# Patient Record
Sex: Male | Born: 1935
Health system: Southern US, Community
[De-identification: ages and names within clinical notes are randomized; demographics above are authoritative.]

## PROBLEM LIST (undated history)

## (undated) DIAGNOSIS — I2510 Atherosclerotic heart disease of native coronary artery without angina pectoris: Secondary | ICD-10-CM

## (undated) DIAGNOSIS — M1990 Unspecified osteoarthritis, unspecified site: Secondary | ICD-10-CM

## (undated) DIAGNOSIS — I10 Essential (primary) hypertension: Secondary | ICD-10-CM

## (undated) DIAGNOSIS — R066 Hiccough: Secondary | ICD-10-CM

## (undated) DIAGNOSIS — I739 Peripheral vascular disease, unspecified: Secondary | ICD-10-CM

## (undated) DIAGNOSIS — Z87442 Personal history of urinary calculi: Secondary | ICD-10-CM

## (undated) DIAGNOSIS — K219 Gastro-esophageal reflux disease without esophagitis: Secondary | ICD-10-CM

## (undated) DIAGNOSIS — K2970 Gastritis, unspecified, without bleeding: Secondary | ICD-10-CM

## (undated) DIAGNOSIS — E785 Hyperlipidemia, unspecified: Secondary | ICD-10-CM

## (undated) DIAGNOSIS — B9681 Helicobacter pylori [H. pylori] as the cause of diseases classified elsewhere: Secondary | ICD-10-CM

## (undated) HISTORY — DX: Helicobacter pylori (H. pylori) as the cause of diseases classified elsewhere: K29.70

## (undated) HISTORY — DX: Hyperlipidemia, unspecified: E78.5

## (undated) HISTORY — DX: Peripheral vascular disease, unspecified: I73.9

## (undated) HISTORY — PX: JOINT REPLACEMENT: SHX530

## (undated) HISTORY — PX: CATARACT EXTRACTION: SUR2

## (undated) HISTORY — DX: Essential (primary) hypertension: I10

## (undated) HISTORY — DX: Atherosclerotic heart disease of native coronary artery without angina pectoris: I25.10

## (undated) HISTORY — DX: Helicobacter pylori (H. pylori) as the cause of diseases classified elsewhere: B96.81

## (undated) HISTORY — PX: EYE SURGERY: SHX253

## (undated) HISTORY — DX: Hiccough: R06.6

## (undated) HISTORY — PX: COLONOSCOPY: SHX174

---

## 1998-12-07 ENCOUNTER — Other Ambulatory Visit: Admission: RE | Admit: 1998-12-07 | Discharge: 1998-12-07 | Payer: Self-pay | Admitting: *Deleted

## 2006-02-20 HISTORY — PX: ESOPHAGOGASTRODUODENOSCOPY: SHX1529

## 2007-01-10 ENCOUNTER — Ambulatory Visit: Payer: Self-pay | Admitting: Vascular Surgery

## 2008-03-07 ENCOUNTER — Encounter: Payer: Self-pay | Admitting: Cardiology

## 2008-04-14 ENCOUNTER — Ambulatory Visit (HOSPITAL_COMMUNITY): Admission: RE | Admit: 2008-04-14 | Discharge: 2008-04-14 | Payer: Self-pay | Admitting: Cardiology

## 2008-05-06 ENCOUNTER — Encounter: Payer: Self-pay | Admitting: Thoracic Surgery (Cardiothoracic Vascular Surgery)

## 2008-05-07 ENCOUNTER — Inpatient Hospital Stay (HOSPITAL_COMMUNITY): Admission: AD | Admit: 2008-05-07 | Discharge: 2008-05-10 | Payer: Self-pay | Admitting: Cardiology

## 2008-05-07 ENCOUNTER — Ambulatory Visit: Payer: Self-pay | Admitting: Thoracic Surgery (Cardiothoracic Vascular Surgery)

## 2008-05-07 HISTORY — PX: CORONARY ARTERY BYPASS GRAFT: SHX141

## 2008-06-02 ENCOUNTER — Ambulatory Visit: Payer: Self-pay | Admitting: Thoracic Surgery (Cardiothoracic Vascular Surgery)

## 2008-06-02 ENCOUNTER — Encounter
Admission: RE | Admit: 2008-06-02 | Discharge: 2008-06-02 | Payer: Self-pay | Admitting: Thoracic Surgery (Cardiothoracic Vascular Surgery)

## 2008-06-05 ENCOUNTER — Encounter (HOSPITAL_COMMUNITY): Admission: RE | Admit: 2008-06-05 | Discharge: 2008-09-06 | Payer: Self-pay | Admitting: Cardiology

## 2009-02-28 ENCOUNTER — Emergency Department (HOSPITAL_COMMUNITY): Admission: EM | Admit: 2009-02-28 | Discharge: 2009-02-28 | Payer: Self-pay | Admitting: Emergency Medicine

## 2009-03-05 ENCOUNTER — Telehealth: Payer: Self-pay | Admitting: Internal Medicine

## 2009-03-06 ENCOUNTER — Ambulatory Visit: Payer: Self-pay | Admitting: Internal Medicine

## 2009-03-09 ENCOUNTER — Encounter: Payer: Self-pay | Admitting: Internal Medicine

## 2009-03-09 ENCOUNTER — Ambulatory Visit: Payer: Self-pay | Admitting: Internal Medicine

## 2009-03-09 HISTORY — PX: UPPER GASTROINTESTINAL ENDOSCOPY: SHX188

## 2009-07-13 ENCOUNTER — Inpatient Hospital Stay (HOSPITAL_COMMUNITY): Admission: RE | Admit: 2009-07-13 | Discharge: 2009-07-16 | Payer: Self-pay | Admitting: Orthopedic Surgery

## 2010-12-17 ENCOUNTER — Ambulatory Visit (INDEPENDENT_AMBULATORY_CARE_PROVIDER_SITE_OTHER): Payer: Medicare Other | Admitting: Cardiology

## 2010-12-17 DIAGNOSIS — I10 Essential (primary) hypertension: Secondary | ICD-10-CM

## 2010-12-17 DIAGNOSIS — E119 Type 2 diabetes mellitus without complications: Secondary | ICD-10-CM

## 2010-12-17 DIAGNOSIS — I2510 Atherosclerotic heart disease of native coronary artery without angina pectoris: Secondary | ICD-10-CM

## 2010-12-21 ENCOUNTER — Ambulatory Visit (HOSPITAL_COMMUNITY)
Admission: RE | Admit: 2010-12-21 | Discharge: 2010-12-21 | Disposition: A | Discharge status: Home / Self Care | Payer: Medicare Other | Source: Ambulatory Visit | Attending: General Surgery | Admitting: General Surgery

## 2010-12-21 ENCOUNTER — Encounter (HOSPITAL_COMMUNITY)
Admission: RE | Admit: 2010-12-21 | Discharge: 2010-12-21 | Disposition: A | Discharge status: Home / Self Care | Payer: Medicare Other | Source: Ambulatory Visit | Attending: General Surgery | Admitting: General Surgery

## 2010-12-21 ENCOUNTER — Other Ambulatory Visit (HOSPITAL_COMMUNITY): Payer: Self-pay | Admitting: General Surgery

## 2010-12-21 DIAGNOSIS — Z01812 Encounter for preprocedural laboratory examination: Secondary | ICD-10-CM | POA: Insufficient documentation

## 2010-12-21 DIAGNOSIS — Z01811 Encounter for preprocedural respiratory examination: Secondary | ICD-10-CM

## 2010-12-21 DIAGNOSIS — Z01818 Encounter for other preprocedural examination: Secondary | ICD-10-CM | POA: Insufficient documentation

## 2010-12-21 LAB — COMPREHENSIVE METABOLIC PANEL
ALT: 15 U/L (ref 0–53)
AST: 20 U/L (ref 0–37)
Albumin: 4 g/dL (ref 3.5–5.2)
Alkaline Phosphatase: 79 U/L (ref 39–117)
Calcium: 9.9 mg/dL (ref 8.4–10.5)
GFR calc Af Amer: >60 mL/min (ref >60)
Glucose, Bld: 142 mg/dL — ABNORMAL HIGH (ref 70–99)
Potassium: 5.4 mEq/L — ABNORMAL HIGH (ref 3.5–5.1)
Sodium: 134 mEq/L — ABNORMAL LOW (ref 135–145)
Total Protein: 7.2 g/dL (ref 6.0–8.3)

## 2010-12-21 LAB — DIFFERENTIAL
Basophils Absolute: 0 K/uL (ref 0.0–0.1)
Basophils Relative: 0 % (ref 0–1)
Eosinophils Absolute: 0.2 K/uL (ref 0.0–0.7)
Eosinophils Relative: 2 % (ref 0–5)
Lymphocytes Relative: 28 % (ref 12–46)
Lymphs Abs: 2.9 K/uL (ref 0.7–4.0)
Monocytes Absolute: 0.7 K/uL (ref 0.1–1.0)
Monocytes Relative: 7 % (ref 3–12)
Neutro Abs: 6.4 K/uL (ref 1.7–7.7)
Neutrophils Relative %: 62 % (ref 43–77)

## 2010-12-21 LAB — CBC
HCT: 42.8 % (ref 39.0–52.0)
MCHC: 34.8 g/dL (ref 30.0–36.0)
Platelets: 169 10*3/uL (ref 150–400)
RDW: 13.1 % (ref 11.5–15.5)
WBC: 10.2 10*3/uL (ref 4.0–10.5)

## 2010-12-23 LAB — MRSA CULTURE

## 2010-12-24 ENCOUNTER — Ambulatory Visit (HOSPITAL_COMMUNITY)
Admission: RE | Admit: 2010-12-24 | Discharge: 2010-12-24 | Disposition: A | Discharge status: Home / Self Care | Payer: Medicare Other | Source: Ambulatory Visit | Attending: General Surgery | Admitting: General Surgery

## 2010-12-24 DIAGNOSIS — Z7982 Long term (current) use of aspirin: Secondary | ICD-10-CM | POA: Insufficient documentation

## 2010-12-24 DIAGNOSIS — M129 Arthropathy, unspecified: Secondary | ICD-10-CM | POA: Insufficient documentation

## 2010-12-24 DIAGNOSIS — I2510 Atherosclerotic heart disease of native coronary artery without angina pectoris: Secondary | ICD-10-CM | POA: Insufficient documentation

## 2010-12-24 DIAGNOSIS — Z951 Presence of aortocoronary bypass graft: Secondary | ICD-10-CM | POA: Insufficient documentation

## 2010-12-24 DIAGNOSIS — E7800 Pure hypercholesterolemia, unspecified: Secondary | ICD-10-CM | POA: Insufficient documentation

## 2010-12-24 DIAGNOSIS — K4020 Bilateral inguinal hernia, without obstruction or gangrene, not specified as recurrent: Secondary | ICD-10-CM | POA: Insufficient documentation

## 2010-12-24 DIAGNOSIS — F17200 Nicotine dependence, unspecified, uncomplicated: Secondary | ICD-10-CM | POA: Insufficient documentation

## 2010-12-24 DIAGNOSIS — E119 Type 2 diabetes mellitus without complications: Secondary | ICD-10-CM | POA: Insufficient documentation

## 2010-12-24 DIAGNOSIS — I10 Essential (primary) hypertension: Secondary | ICD-10-CM | POA: Insufficient documentation

## 2010-12-24 HISTORY — PX: HERNIA REPAIR: SHX51

## 2010-12-24 LAB — GLUCOSE, CAPILLARY
Glucose-Capillary: 160 mg/dL — ABNORMAL HIGH (ref 70–99)
Glucose-Capillary: 138 mg/dL — ABNORMAL HIGH (ref 70–99)

## 2010-12-29 NOTE — Op Note (Signed)
NAME:  [name], [name]                  ACCOUNT NO.:  [id number]  MEDICAL RECORD NO.:  [id number]           PATIENT TYPE:  O  LOCATION:  SDSC                         FACILITY:  [facility]  PHYSICIAN:  Ollen Gross. Vernell Morgans, M.D. DATE OF BIRTH:  June 07, 1935  DATE OF PROCEDURE:  12/24/2010 DATE OF DISCHARGE:  12/24/2010                              OPERATIVE REPORT   PREOPERATIVE DIAGNOSIS:  Right inguinal hernia.  POSTOPERATIVE DIAGNOSIS:  Right direct inguinal hernia.  PROCEDURES:  Right inguinal hernia repair with mesh.  SURGEON:  Ollen Gross. Vernell Morgans, MD.  ANESTHESIA:  General via LMA.  DESCRIPTION OF PROCEDURE:  After informed consent was obtained, the patient was brought to the operating room, placed in supine position on the operating room table.  After adequate induction of general anesthesia, the patient's abdomen and the right groin were prepped with ChloraPrep, allowed to dry, and draped in usual sterile manner.  The right groin was then infiltrated with 0.25% Marcaine.  An incision was made from the edge of the pubic tubercle on the right towards the anterior cephalic spine.  With a 15 blade knife, this incision was carried down through the skin and subcutaneous tissue sharply with the electrocautery until the fascia of the external oblique was encountered. Small bridging vein was clamped with hemostats, divided and ligated with 3-0 silk ties.  The external oblique fascia was opened along its fibers towards the apex of the external ring with 15 blade knife and Metzenbaum scissors.  A Weitlaner retractor was then deployed.  Blunt dissection was then carried out in this of the cord structures at the edge of the pubic tubercle until they could be surrounded between two fingers.  A 1/2-inch Penrose drain was placed around the cord structures for retraction purposes.  The cord was then gently skeletonized.  There was a lipoma of the cord that was excised sharply by combination of  blunt hemostat dissection and some sharp dissection with the electrocautery. The base of the lipoma was clamped with hemostat, divided, and ligated with 3-0 silk tie.  There was no hernia sac that I could appreciate with the cord.  There was a definite bulging defect medial to this in the floor of the canal.  It was a broad based.  It was readily reduced and the floor of the canal was then repaired with a running 0 Vicryl stitch. The tails of the stitch were left long at the edge of the cord.  Next, a 3 x 6 piece of Ultrapro mesh was chosen and cut to fit.  The mesh was sewed inferiorly to the shelving edge of the inguinal ligament with a running 2-0 Prolene stitch.  The tails of the Vicryl were brought through the mesh and tied down.  Tails were cut in the lateral part of the mesh and the tails were wrapped around the cord structures. Superiorly, the mesh was sewed to the muscular aponeurotic strength layer of the transversalis with interrupted 2-0 Prolene vertical mattress stitches lateral to the cord.  Tails of the mesh were anchored to the shelving edge of the inguinal ligament with  interrupted 2-0 Prolene stitch.  The ileal inguinal nerve was never seen on the surface of the cord.  At this point, the hernia appeared to be well repaired and the mesh was in good position without any tension.  The wound was irrigated with copious amounts of saline.  The vas and testicular artery were preserved.  The external oblique fascia was then reapproximated with a running 2-0 Vicryl stitch.  The wound was infiltrated with more 0.25% Marcaine.  Subcutaneous fascia was closed with a running 3-0 Vicryl stitch and the skin was closed with running 4-0 Monocryl subcuticular stitch.  Dermabond dressing was applied.  The patient tolerated the procedure well.  At the end of the case, all needle, sponge, and instrument counts were correct.  The patient was then awakened and taken to recovery room in stable  condition.  The patient's testicle was in the scrotum at the end of the case.     Ollen Gross. Vernell Morgans, M.D.     PST/MEDQ  D:  12/24/2010  T:  12/25/2010  Job:  [id number]  Electronically Signed by Chevis Pretty III M.D. on 12/29/2010 08:15:59 AM

## 2011-02-04 LAB — BASIC METABOLIC PANEL
BUN: 8 mg/dL (ref 6–23)
BUN: 6 mg/dL (ref 6–23)
BUN: 8 mg/dL (ref 6–23)
CO2: 28 mEq/L (ref 19–32)
CO2: 27 mEq/L (ref 19–32)
CO2: 28 mEq/L (ref 19–32)
Calcium: 8.2 mg/dL — ABNORMAL LOW (ref 8.4–10.5)
Calcium: 8 mg/dL — ABNORMAL LOW (ref 8.4–10.5)
Calcium: 8.1 mg/dL — ABNORMAL LOW (ref 8.4–10.5)
Chloride: 93 mEq/L — ABNORMAL LOW (ref 96–112)
Chloride: 95 mEq/L — ABNORMAL LOW (ref 96–112)
Chloride: 98 mEq/L (ref 96–112)
Creatinine, Ser: 0.82 mg/dL (ref 0.4–1.5)
Creatinine, Ser: 0.84 mg/dL (ref 0.4–1.5)
Creatinine, Ser: 0.86 mg/dL (ref 0.4–1.5)
GFR calc Af Amer: >60 mL/min (ref >60)
GFR calc Af Amer: >60 mL/min (ref >60)
GFR calc Af Amer: >60 mL/min (ref >60)
GFR calc non Af Amer: >60 mL/min (ref >60)
GFR calc non Af Amer: >60 mL/min (ref >60)
GFR calc non Af Amer: >60 mL/min (ref >60)
Glucose, Bld: 170 mg/dL — ABNORMAL HIGH (ref 70–99)
Glucose, Bld: 128 mg/dL — ABNORMAL HIGH (ref 70–99)
Glucose, Bld: 129 mg/dL — ABNORMAL HIGH (ref 70–99)
Potassium: 4.5 mEq/L (ref 3.5–5.1)
Potassium: 4.2 mEq/L (ref 3.5–5.1)
Potassium: 4.5 mEq/L (ref 3.5–5.1)
Sodium: 128 mEq/L — ABNORMAL LOW (ref 135–145)
Sodium: 129 mEq/L — ABNORMAL LOW (ref 135–145)
Sodium: 131 mEq/L — ABNORMAL LOW (ref 135–145)

## 2011-02-04 LAB — CBC
HCT: 28.7 % — ABNORMAL LOW (ref 39.0–52.0)
HCT: 30.5 % — ABNORMAL LOW (ref 39.0–52.0)
HCT: 32.5 % — ABNORMAL LOW (ref 39.0–52.0)
HCT: 45.3 % (ref 39.0–52.0)
Hemoglobin: 10.3 g/dL — ABNORMAL LOW (ref 13.0–17.0)
Hemoglobin: 10.4 g/dL — ABNORMAL LOW (ref 13.0–17.0)
Hemoglobin: 11.2 g/dL — ABNORMAL LOW (ref 13.0–17.0)
Hemoglobin: 15.4 g/dL (ref 13.0–17.0)
MCHC: 35.9 g/dL (ref 30.0–36.0)
MCHC: 34 g/dL (ref 30.0–36.0)
MCHC: 34 g/dL (ref 30.0–36.0)
MCHC: 34.3 g/dL (ref 30.0–36.0)
MCV: 89.4 fL (ref 78.0–100.0)
MCV: 90.5 fL (ref 78.0–100.0)
MCV: 90.7 fL (ref 78.0–100.0)
MCV: 91 fL (ref 78.0–100.0)
Platelets: 141 10*3/uL — ABNORMAL LOW (ref 150–400)
Platelets: 125 10*3/uL — ABNORMAL LOW (ref 150–400)
Platelets: 127 10*3/uL — ABNORMAL LOW (ref 150–400)
Platelets: 161 10*3/uL (ref 150–400)
RBC: 3.21 MIL/uL — ABNORMAL LOW (ref 4.22–5.81)
RBC: 3.35 MIL/uL — ABNORMAL LOW (ref 4.22–5.81)
RBC: 3.6 MIL/uL — ABNORMAL LOW (ref 4.22–5.81)
RBC: 5 MIL/uL (ref 4.22–5.81)
RDW: 13.4 % (ref 11.5–15.5)
RDW: 13.8 % (ref 11.5–15.5)
RDW: 13.8 % (ref 11.5–15.5)
RDW: 14.2 % (ref 11.5–15.5)
WBC: 11.8 10*3/uL — ABNORMAL HIGH (ref 4.0–10.5)
WBC: 10.6 10*3/uL — ABNORMAL HIGH (ref 4.0–10.5)
WBC: 10.6 10*3/uL — ABNORMAL HIGH (ref 4.0–10.5)
WBC: 11.3 10*3/uL — ABNORMAL HIGH (ref 4.0–10.5)

## 2011-02-04 LAB — GLUCOSE, CAPILLARY
Glucose-Capillary: 158 mg/dL — ABNORMAL HIGH (ref 70–99)
Glucose-Capillary: 103 mg/dL — ABNORMAL HIGH (ref 70–99)
Glucose-Capillary: 106 mg/dL — ABNORMAL HIGH (ref 70–99)
Glucose-Capillary: 116 mg/dL — ABNORMAL HIGH (ref 70–99)
Glucose-Capillary: 117 mg/dL — ABNORMAL HIGH (ref 70–99)
Glucose-Capillary: 125 mg/dL — ABNORMAL HIGH (ref 70–99)
Glucose-Capillary: 133 mg/dL — ABNORMAL HIGH (ref 70–99)
Glucose-Capillary: 135 mg/dL — ABNORMAL HIGH (ref 70–99)
Glucose-Capillary: 159 mg/dL — ABNORMAL HIGH (ref 70–99)
Glucose-Capillary: 175 mg/dL — ABNORMAL HIGH (ref 70–99)
Glucose-Capillary: 179 mg/dL — ABNORMAL HIGH (ref 70–99)
Glucose-Capillary: 92 mg/dL (ref 70–99)
Glucose-Capillary: 95 mg/dL (ref 70–99)

## 2011-02-04 LAB — PROTIME-INR
INR: 2.1 — ABNORMAL HIGH (ref 0.00–1.49)
INR: 1 (ref 0.00–1.49)
INR: 1.3 (ref 0.00–1.49)
INR: 1.9 — ABNORMAL HIGH (ref 0.00–1.49)
Prothrombin Time: 23.3 seconds — ABNORMAL HIGH (ref 11.6–15.2)
Prothrombin Time: 13 seconds (ref 11.6–15.2)
Prothrombin Time: 15.9 seconds — ABNORMAL HIGH (ref 11.6–15.2)
Prothrombin Time: 21.4 seconds — ABNORMAL HIGH (ref 11.6–15.2)

## 2011-02-04 LAB — URINALYSIS, ROUTINE W REFLEX MICROSCOPIC
Bilirubin Urine: NEGATIVE
Glucose, UA: NEGATIVE mg/dL
Hgb urine dipstick: NEGATIVE
Ketones, ur: NEGATIVE mg/dL
Nitrite: NEGATIVE
Protein, ur: NEGATIVE mg/dL
Specific Gravity, Urine: 1.016 (ref 1.005–1.030)
Urobilinogen, UA: 1 mg/dL (ref 0.0–1.0)
pH: 6 (ref 5.0–8.0)

## 2011-02-04 LAB — COMPREHENSIVE METABOLIC PANEL
ALT: 15 U/L (ref 0–53)
AST: 25 U/L (ref 0–37)
Albumin: 4 g/dL (ref 3.5–5.2)
Alkaline Phosphatase: 73 U/L (ref 39–117)
BUN: 14 mg/dL (ref 6–23)
CO2: 29 mEq/L (ref 19–32)
Calcium: 9.5 mg/dL (ref 8.4–10.5)
Chloride: 96 mEq/L (ref 96–112)
Creatinine, Ser: 0.8 mg/dL (ref 0.4–1.5)
GFR calc Af Amer: >60 mL/min (ref >60)
GFR calc non Af Amer: >60 mL/min (ref >60)
Glucose, Bld: 121 mg/dL — ABNORMAL HIGH (ref 70–99)
Potassium: 4.7 mEq/L (ref 3.5–5.1)
Sodium: 132 mEq/L — ABNORMAL LOW (ref 135–145)
Total Bilirubin: 0.7 mg/dL (ref 0.3–1.2)
Total Protein: 7.1 g/dL (ref 6.0–8.3)

## 2011-02-04 LAB — TYPE AND SCREEN
ABO/RH(D): B POS
Antibody Screen: NEGATIVE

## 2011-02-04 LAB — APTT: aPTT: 28 seconds (ref 24–37)

## 2011-02-04 LAB — ABO/RH: ABO/RH(D): B POS

## 2011-02-08 LAB — GLUCOSE, CAPILLARY: Glucose-Capillary: 129 mg/dL — ABNORMAL HIGH (ref 70–99)

## 2011-03-15 NOTE — Consult Note (Signed)
NAME:  [name], [name]                  ACCOUNT NO.:  [id number]   MEDICAL RECORD NO.:  [id number]          PATIENT TYPE:  OIB   LOCATION:  3741                         FACILITY:  [facility]   PHYSICIAN:  Salvatore Decent. Dorris Fetch, M.D.DATE OF BIRTH:  1935-10-28   DATE OF CONSULTATION:  DATE OF DISCHARGE:                                 CONSULTATION   REASON FOR CONSULTATION:  Severe left main disease.   HISTORY OF PRESENT ILLNESS:  Mr. Matos is a 76 year old gentleman who has  a history of type 2 diabetes and dyslipidemia and tobacco abuse.  He was  seen in 2003, with exertional chest pain.  He would also occasionally  have episodes of arrest at time and he was also having increased  fatigue.  He had a stress Cardiolite at that time, which was consistent  with ischemia.  His ejection fraction was normal.  He was advised to  have cardiac catheterization, but refused at that time and really done  well since then.  He remain physically active walking, usually 3 miles a  day, but he had noticed over the past several weeks that after walking  about 15 minutes, he would get tired and then with minimal rest  sometimes less than a minute, he would recover and then be able to  complete his walk.  He mentioned this to Dr. Clelia Croft.  It was recommended  that he have a cardiac CT done that showed extensive coronary disease  with a calcium score of 2443.  He was seen by Dr. Sw[place]il[place] and was  recommended that he have cardiac catheterization. The patient was seen  in mid June, but was reluctant to come in for his catheterization, but  finally came into have that done today, cardiac catheterization he had a  90% ostial left main, he had a 50% stenosis in his LAD proximal 50%  stenosis in his first diagonal.  His left circumflex was relatively  small.  His right coronary was severely disease and heavily calcified,  gave off 4 terminal branches and there was diffuse disease distally.  His ejection fraction was normal.   The patient currently is painfree.   PAST MEDICAL HISTORY:  1. Significant for type 2 diabetes non-insulin-dependent.  2. Dyslipidemia.  3. Tobacco abuse, 100 pack-years.  4. Suspected coronary disease.   PAST SURGICAL HISTORY:  Significant for treatment of trochanteric  bursitis and cataract surgery.   MEDICATIONS ON ADMISSION:  1. Altace 5 mg daily  2. Aspirin 81 mg daily.  3. Crestor 5 mg daily.   ALLERGIES:  He has an allergy to SULFA.   FAMILY HISTORY:  Father died at age 77 of a heart attack.  His mother  lives, to be 102.  He has two brothers and sister, all of them has had  coronary bypass surgery.   SOCIAL HISTORY:  He is a retired Medical illustrator.  He exercises on a regular  basis, walks about three miles a day.  He is currently smoking about  half-a-pack of cigarettes a day, but has a history of as much as two  packs per  day over 50 years.  He is married with two grown sons.   REVIEW OF SYSTEMS:  See HPI.  Also has noted pain in his calves with  walking.  Denies any wheezing or excessive bleeding.  He does bruise  easily since he been on aspirin, but no history prior to that.  All  other systems are negative.   PHYSICAL EXAMINATION:  GENERAL:  Mr. Terrance is a 76 year old white male in  no acute distress.  NEUROLOGIC:  He is alert and appropriate with no focal deficits.  GENERAL:  He is well developed and well nourished.  HEENT:  Unremarkable.  NECK:  Supple without thyromegaly, adenopathy, or bruits.  CARDIAC:  Regular rate and rhythm.  Normal S1 and S2.  There is no  murmurs or gallops.  LUNGS:  Clear with equal breath sounds bilaterally.  ABDOMEN:  Soft and nontender.  EXTREMITIES:  Without clubbing, cyanosis, or edema.  Pedal pulses are  diminished bilaterally.  SKIN:  Warm and dry.   LABORATORY DATA:  Cardiac catheterization.  Cardiac CT reviewed.  EKG  shows sinus bradycardia.  His chest x-ray as an outpatient showed no  active disease.  His sodium is 134,  potassium 3.8, BUN and creatinine  12, and 0.76, glucose 123.  White count 12.4, hematocrit 44, platelets  211, PT 10.6, and PTT 30.  Carotid Doppler showed no evidence of  internal carotid stenosis.  ABIs were 0.97 on the right and 0.81 on the  left.   IMPRESSION:  Mr. Krupka is a 76 year old gentleman with multiple cardiac  risk factors who presents with relatively mild exertional symptoms.  He  has not had true angina, but does have some decreased exercise tolerance  and fatigue, more easily than he had been previously.  He is very much  minimized at his symptoms.  He did have a very impressive cardiac CT and  today cardiac catheterization was found to have a 90% ostial left main  stenosis as well as a diffuse disease right coronary with preserved left  ventricular function.   Coronary artery bypass grafting is indicated for survival benefit as  well as relief of symptoms even though the symptoms are minimal at the  present time.  I discussed with the patient's family indications, risk,  benefits, and alternatives.  He understands the risks include but not  limited to death, stroke, MI, DVT, PE, bleeding, possible need  transfusions, infections as well as other organ system dysfunction  including respiratory, renal, or GI complications.  He understands,  accepts these risks and agrees to proceed with plan to proceed with  surgery tomorrow morning.  The patient had initially requested with Dr.  Laneta Simmers who would not be able to do the procedure this week and  subsequently discussed with the family and decided to proceed the  surgery tomorrow.      Salvatore Decent Dorris Fetch, M.D.  Electronically Signed     SCH/MEDQ  D:  05/06/2008  T:  05/07/2008  Job:  [id number]   cc:   Peter M. [place], M.D.  Kari Baars, M.D.

## 2011-03-15 NOTE — H&P (Signed)
NAME:  [name], [name] NO.:  [id number]   MEDICAL RECORD NO.:  [id number]          PATIENT TYPE:  OUT   LOCATION:  CATS                         FACILITY:  [facility]   PHYSICIAN:  Peter M. [place], M.D.  DATE OF BIRTH:  1935-03-28   DATE OF ADMISSION:  04/14/2008  DATE OF DISCHARGE:  04/14/2008                              HISTORY & PHYSICAL   HISTORY OF PRESENT ILLNESS:  [name] is a 76 year old white male who is  admitted for cardiac catheterization.  The patient was seen initially in  March 2003 with symptoms of chest pain.  He described a 54-month history  at that time discomfort in his chest that was not always occurring with  exertion and may occur at rest.  He noticed some increased fatigue.  He  subsequently had a stress Cardiolite performed, which showed EKG changes  consistent with ischemia and Cardiolite images showed mild inferior  basal ischemia.  Ejection fraction was 60%.  The patient opted not to  have any further evaluation at that time.  He was subsequently seen back  in May 2009.  This was following abnormal ECG.  He does note that after  walking 14-15 minutes, he seems to hit a wall becomes extremely fatigued  and it relates a little while, he able to resume his activity without  any further problem, and he currently denies any significant chest pain.  The patient was very anxious to avoid cardiac catheterization, so we  recommended cardiac CT.  His CT showed a extremely high-calcium score of  2443.  He had extensive calcification in the LAD and right coronary  making major portions of these vessels unassessable by CT.  He did  appear to have stenosis in the distal right coronary bed prior to the  and takeoff of the PDA involving the proximal posterolateral branches.  His left ventricular function again was normal.  He did have some  atherosclerotic disease in the descending aorta.  Now based on his  cardiac CT findings as well as a prior cardiac CT  findings, we have  strongly recommend he undergo cardiac catheterization given his  extremely high-calcium score and findings.  It is felt that he probably  does have significant coronary disease as he does have prior risk  factors of hypercholesterolemia, tobacco abuse, and very strong family  history of coronary disease.  He also has a history of diabetes that is  being treated by diet.   PAST MEDICAL HISTORY:  1. Diabetes mellitus type 2.  2. Hypercholesterolemia.  3. History of trochanteric bursitis.  4. He has had prior cataract surgery.   ALLERGIES:  He is allergic to SULFA.   CURRENT MEDICATIONS:  1. Altace 5 mg per day.  2. Bare Aspirin 81 mg per day.  3. He is just started Crestor 10 mg per day.   SOCIAL HISTORY:  The patient is retired.  He is a former Medical illustrator.  He  does exercise regularly.  He smokes currently five cigarettes per day.  He is married and has 2 sons.   FAMILY HISTORY:  Father died at age 13 of myocardial infarction.  He has  also hypertensive and diabetic.  Mother died age of 32.  He has 2  brothers and sister all of whom have had coronary bypass surgery.   REVIEW OF SYSTEMS:  As noted in the HPI, otherwise, negative.   PHYSICAL EXAMINATION:  GENERAL:  The patient is pleasant white male, in  no distress.  VITAL SIGNS:  Weight is 202, blood pressure is 160/78, and pulse 72 and  regular.  HEENT:  Normocephalic and atraumatic.  His pupils are equal, round, and  reactive.  His oropharynx is clear.  NECK:  Supple without JVD, adenopathy, thyromegaly, or bruits.  LUNGS:  Clear.  CARDIAC:  Regular rate and rhythm without gallop, murmur, rub, or click.  ABDOMEN:  Soft, nontender.  He has no hepatosplenomegaly, masses, or  bruits.  EXTREMITIES:  Femoral and pedal pulses 2+ and symmetric.  NEUROLOGIC:  Nonfocal.   LABORATORY DATA:  Resting ECG shows sinus bradycardia, otherwise,  normal.   IMPRESSION:  1. Symptoms of exertional fatigue and  intermittent chest pain.  The      patient has had abnormal stress Cardiolite study and abnormal      cardiac CT angiography with a markedly elevated calcium score      consistent with significant coronary disease.  2. Diabetes mellitus type 2, diet controlled.  3. Hyperlipidemia.  4. Tobacco abuse.  5. Strong family history of coronary disease.   PLAN:  We will proceed with diagnostic cardiac catheterization with  further therapy, pending these results.           ______________________________  Peter M. [place], M.D.     PMJ/MEDQ  D:  05/05/2008  T:  05/05/2008  Job:  [id number]   cc:   Kari Baars, M.D.

## 2011-03-15 NOTE — Discharge Summary (Signed)
NAME:  [name], [name]                  ACCOUNT NO.:  [id number]   MEDICAL RECORD NO.:  [id number]          PATIENT TYPE:  INP   LOCATION:  2010                         FACILITY:  [facility]   PHYSICIAN:  Salvatore Decent. Dorris Fetch, M.D.DATE OF BIRTH:  July 21, 1935   DATE OF ADMISSION:  05/06/2008  DATE OF DISCHARGE:                               DISCHARGE SUMMARY   FINAL DIAGNOSIS:  Left main and three-vessel coronary artery disease.   IN-HOSPITAL DIAGNOSIS:  Volume overload postoperatively.   SECONDARY DIAGNOSES:  1. Diabetes mellitus type 2.  2. Hypercholesterolemia.  3. History of trochanteric bursitis.  4. Status post prior cataract surgery.   IN-HOSPITAL OPERATIONS AND PROCEDURES:  1. Coronary artery bypass grafting x5 using a left internal mammary      artery to left anterior descending, saphenous vein graft to first      diagonal, saphenous vein graft to obtuse marginal one, sequential      saphenous vein graft to posterior descending posterolateral      branches.  Endoscopic vein harvesting of bilateral thighs.  2. Cardiac catheterization.   HISTORY AND PHYSICAL AND HOSPITAL COURSE:  The patient is a 76 year old  gentleman who has multiple cardiac risk factors who presents with mild  changes in exercise tolerance.  He had a cardiac CT which showed a  markedly elevated calcium score and underwent elective cardiac  catheterization by Dr. Sw[place]iland which revealed critical left main disease  as well as diffuse disease of right coronary.  He also had a 50%  stenosis in the LAD and 50% stenosis in the first diagonal.  The patient  was advised to undergo a coronary artery bypass grafting.  The patient  was referred to Dr. Dorris Fetch.  Dr. Dorris Fetch saw and evaluated the  patient.  He discussed with the patient undergoing coronary bypass  grafting.  Risks and benefits discussed.  The patient acknowledged  understanding and agreed to proceed.  Surgery was scheduled for May 07, 2008.   Preoperatively, the patient underwent bilateral carotid duplex  ultrasound showing no significant ICA stenosis.  He also had  preoperative ABIs showing on the right to be 0.97 and left to be 0.81.  The patient remained stable preoperatively.  For details of the  patient's past medical history and physical exam, please see dictated  H&P.   The patient was taken to the operating room on May 07, 2008, where he  underwent coronary artery bypass grafting x5 using a left internal  mammary artery to left anterior descending, saphenous vein graft to  first diagonal, saphenous vein graft to obtuse marginal one, sequential  saphenous vein graft to posterior descending posterolateral branches.  Endoscopic vein harvest was done from bilateral thighs.  The patient  tolerated this procedure well and was transferred to the intensive care  unit in stable condition.  The patient's postoperative course was pretty  much uneventful.  He was extubated following surgery.  Post extubation,  he was noted to be alert and oriented x4 and neuro intact.  The patient  was noted to be hemodynamically stable.  On the intensive care  unit, a  chest x-ray obtained and remained stable.  The patient had minimal  drainage from chest tubes and they were discontinued in normal fashion.  He was able to be weaned off all drips.  He did require a pacing.  This  was able to be discontinued with heart rate maintaining greater than  60s.  The patient was out of bed and ambulating well with cardiac rehab.  He was transferred out to PCT on postop day #2.  The patient was started  on low-dose diuretics for his volume overload postoperatively.  This was  improving prior to discharge home.  Postoperatively, the patient did  remain in normal sinus rhythm.  He was started on low-dose beta-blocker  as well as an ACE inhibitor and blood pressure remained stable.  His  repeat chest x-ray following removal of chest tubes remained stable.  He   was using his incentive spirometer and he was able to be weaned off  oxygen, sating greater than 98% on room air.  The patient was out of bed  and ambulating well with cardiac rehab.  He was progressing well.  He  was tolerating diet well.  No nausea or vomiting noted.  The patient is  noted to be diabetic and blood sugars were followed postoperatively.  He  was initially started on Lantus insulin.  Blood sugars were stable and  Lantus insulin discontinued.  We will evaluate in the a.m. if the  patient needs to be restarted on his metformin home dose.  All incisions  on the patient were clean, dry, and intact and healing well.   LABORATORY DATA:  On postop day #2 showed a white count 9.5, hemoglobin  9.9, hematocrit 29, platelet count 93.  Sodium of 132, potassium of 3.9,  chloride of 100, bicarb of 26, BUN of 15, creatinine of 0.8, and glucose  of 124.  The patient is tentatively ready for discharge to home in the  a.m. pending he remained stable.   FOLLOW-UP APPOINTMENTS:  A followup appointment has been arranged with  Dr. Dorris Fetch for June 02, 2008, at 12 p.m.  The patient will need to  obtain PA and lateral chest x-ray 30 minutes prior to this appointment.  He will need to follow up with Dr. Sw[place]iland in 2 weeks.  He will need to  contact his office to make these arrangements.   ACTIVITY:  The patient instructed no driving, he agrees to do so, no  heavy lifting over 10 pounds.  He was told to ambulate 3-4 times per  day, progress as tolerated, and to continue his breathing exercises.   INCISIONAL CARE:  The patient was told to shower, wash his incisions  using soap and water.  He is to contact the office if he develops any  drainage or openings from any of his incision sites.   DIET:  The patient is to begin on diet to be low fat, low salt as well  as diabetic diet.   DISCHARGE MEDICATIONS:  1. Aspirin 325 mg daily.  2. Lasix 40 mg daily x5 days.  3. Potassium chloride 20 mEq  daily x5 days.  4. Oxycodone 5 mg 1-2 tablets q.4-6 h. p.r.n.  5. Crestor 10 mg daily.  6. Toprol-XL 25 mg daily.      Theda Belfast, PA      Salvatore Decent. Dorris Fetch, M.D.  Electronically Signed    KMD/MEDQ  D:  05/09/2008  T:  05/10/2008  Job:  [id number]  cc:   Peter M. [place], M.D.

## 2011-03-15 NOTE — Assessment & Plan Note (Signed)
OFFICE VISIT   [name], [name]  DOB:  07/09/1935                                        June 02, 2008  CHART #:  [id number]   HISTORY OF PRESENT ILLNESS:  The patient is status post coronary artery  bypass grafting x5 done by Dr. Dorris Fetch on May 07, 2008.  The patient  presents for his 3-week followup visit.  The patient's postoperative  course was pretty much unremarkable.  On discussion today, the patient  feels that he is progressing well.  He still has some mild sternal  discomfort, which he will take Tylenol for as needed.  Cardiac Rehab has  contacted him and he has his orientation this coming Thursday.  He is  ambulating about 1 to 2 miles per day.  He denies any shortness of  breath with exertion.  He has seen Dr. Swaziland and plans to follow back  up in October.  He states his blood sugars have been stable.  The  patient denies any opening or drainage from any of his incision sites.   PHYSICAL EXAMINATION:  VITAL SIGNS:  Blood pressure 125/76, pulse of 84,  respirations of 18, and O2 sat 96% on room air.  RESPIRATORY:  Clear to auscultation bilaterally.  CARDIAC:  Regular rate and rhythm.  S1 and S2 noted.  No murmurs noted.  ABDOMEN:  Benign.  EXTREMITIES:  No edema noted.  Warm and well perfused.  INCISIONS:  All incisions are clean, dry, and intact and healing well.   STUDIES:  The patient had PA and lateral chest x-ray done today June 02, 2008, which is stable.  No pleural effusion, atelectasis, or  pneumothorax noted.   IMPRESSION AND PLAN:  The patient is status post coronary artery bypass  grafting.  The patient is progressing extremely well.  The patient is  instructed still no heavy lifting over 10 pounds for another 4 weeks.  He has been released to drive.  He is to start out slow and progress in  distance.  The patient is to continue all current medications.  To keep  appointment with Dr. Swaziland in October.  He is encouraged to do  cardiac  rehab after orientation and continue increase  in his ambulation.  The patient is felt to be doing well and we will  release the patient from the office.  He is told if he has any surgical  issues, he is to contact us.  The patient is in agreement.   Salvatore Decent Dorris Fetch, M.D.  Electronically Signed   KMD/MEDQ  D:  06/02/2008  T:  06/02/2008  Job:  [id number]   cc:   Peter M. [place], M.D.  Salvatore Decent Dorris Fetch, M.D.

## 2011-03-15 NOTE — Op Note (Signed)
NAME:  [name], [name]                  ACCOUNT NO.:  [id number]   MEDICAL RECORD NO.:  [id number]          PATIENT TYPE:  INP   LOCATION:  2311                         FACILITY:  [facility]   PHYSICIAN:  Salvatore Decent. Dorris Fetch, M.D.DATE OF BIRTH:  20-Sep-1935   DATE OF PROCEDURE:  05/07/2008  DATE OF DISCHARGE:                               OPERATIVE REPORT   PREOPERATIVE DIAGNOSIS:  Left main and three-vessel coronary disease.   POSTOPERATIVE DIAGNOSIS:  Left main and three-vessel coronary disease.   PROCEDURE:  1. Median sternotomy.  2. Extracorporeal circulation coronary bypass grafting times five      (left internal mammary artery to LAD, saphenous vein graft to first      diagonal, saphenous vein graft to obtuse marginal one, sequential      saphenous vein graft to posterior descending and posterior      lateral).  3. Endoscopic vein harvest, bilateral thighs.   SURGEON:  Salvatore Decent. Dorris Fetch, MD.   ASSISTANT:  Coral Ceo, PA   ANESTHESIA:  General.   FINDINGS:  Saphenous vein from right thigh portion, unusable.  All vein  utilized was good quality.  The left mammary good-quality, LAD diagonal  and posterior descending good-quality, targets OM1 posterior lateral  small fair quality targets.   CLINICAL NOTE:  [name] is a 76 year old gentleman who has multiple  cardiac risk factors who presents with mild changes in exercise  tolerance.  He had a cardiac CT, which showed a markedly elevated  calcium score and underwent elective cardiac catheterization by [name], which revealed critical left main disease as well as  diffuse disease right coronary.  He also had a 50% stenosis in the LAD  and 50% stenosis in his first diagonal.  The patient was advised to  undergo coronary bypass grafting.  Indications, risks, benefits, and  alternatives were discussed in detail with the patient, although  initially reluctant he accepted the risks and agreed to proceed.   OPERATIVE  NOTE:  [name] was brought to the preop holding area on May 07, 2008, via the anesthesia services and established intravenous access.  I placed a Swan-Ganz monitoring catheter and placed an arterial blood  pressure monitoring catheter.  Intravenous antibiotics were  administered.  The patient was taken to the operating room,  anesthetized, and intubated.  A Foley catheter was placed.  The chest,  abdomen, and legs were prepped and draped in usual fashion.  A median  sternotomy was performed and the left internal mammary artery was  harvested using standard technique.  Simultaneously an incision was made  in the medial aspect of the right leg at the level of the knee.  Greater  saphenous vein was harvested from the groin to just below the knee.  It  was a bifurcated system and initially appeared that both portions of the  vein might be used for him, however, after removing the vein from the  leg and cannulating was clear that there was a wrong portion of the cyst  and that was too small to utilize for grafting.  Of  note, 5000 units of  heparin was administered during the vessel harvest.  An incision was  made in the medial aspect of the left leg at the level of the knee and  the greater saphenous vein was harvested in the left thigh.  There was  excellent quality.   The remaining of the full heparin dose was given.  The pericardium was  opened.  The ascending aorta was inspected and cannulated via concentric  2-Ethibond pledgeted pursestring sutures.  A dual stage venous cannula  was placed via pursestring suture in the right appendage.  Cardiopulmonary bypass instituted and the patient was cooled to 32  degrees Celsius.  Flows were maintained per protocol.  Anticoagulation  was maintained per protocol and adjusted according to ACT measurements.  The coronary arteries were inspected and anastomotic sites were chosen.  The conduits were inspected and cut to length.  A foam pad was placed  in  the pericardium to protect left phrenic nerve.  A temperature probe was  placed in myocardial septum and a cardioplegic cannula was placed in the  ascending aorta.   The aorta was cross-clamped.  The left ventricle was emptied via aortic  root vent.  Cardiac arrest was achieved with combination of cold,  antegrade blood cardioplegia, and topical iced saline.  A 700-mL of  cardioplegia was administered.  Myocardial septal temperature was 9  degrees Celsius.  Following distal anastomoses were performed.   First, a reversed saphenous vein graft was placed sequentially to the  posterior descending and posterior lateral OM branch, which was  essentially the third posterior lateral branch.  The posterior  descending was a 2-mm good-quality target.  Side-to-side anastomosis was  performed at this vessel.  The distal end of the vein then was cut to  length and end-to-side anastomosis was performed to the distal posterior  lateral branch.  This was a 1-mm vessel.  There was no disease.  There  was only fair quality due to its small size.  All anastomoses were  probed proximally and distally at their completion to ensure patency.  Cardioplegia was administered down the vein graft.  There was adequate  flow and adequate hemostasis.   Next, a reverse saphenous vein graft was placed end-to-side to the first  obtuse marginal branch of the left circumflex.  This was really the only  marginal branch of the circumflex, which was a relatively small vessel  and majority of the posterolateral walls being supplied via the right  coronary system.  This was, however, compromised by the left main  stenosis.  This was a 1-mm fair-quality target.  The vein graft was  anastomosed end-to-side with a running 7-0 Prolene suture.   Next, a reversed saphenous vein graft was placed end-to-side to the  first diagonal branch of the LAD.  The diagonal had a 50% stenosis.  There was heavy calcification in both the  LAD and diagonal at the site  of the diagonal bifurcation.  Diagonal, however, was a good-quality  target at the site of anastomosis.  The vein graft was anastomosed end-  to-side with a running 7-0 Prolene suture.  There was good flow and good  hemostasis.   Next, the left internal mammary artery was brought through a window in  the pericardium.  The distal end was beveled and was anastomosed end-to-  side to the distal LAD.  The LAD was a 2-mm good-quality target.  The  mammary was a 2-mm good-quality conduit.  The anastomosis was performed  with a running 8-0 Prolene suture.  After completion of the mammary to  LAD anastomosis,  the bulldog clamps were briefly removed to inspect for  hemostasis.  Immediate rapid septal rewarming was noted.  The bulldog  clamps were replaced.  The mammary pedicle was tacked at the epicardial  surface of the heart with 6-0 Prolene sutures.   Additional cardioplegia was administered down the vein grafts and the  aortic root.  The vein grafts were cut to length.  The cardioplegic  cannula was removed from the ascending aorta.  The proximal vein graft  anastomoses were performed to 4.5-mm punch aortotomies with running 6-0  Prolene sutures, all under crossclamp, occlusion of frontal proximal  anastomosis.  Lidocaine was administered.  The bulldog clamps again  removed from the left mammary artery.  The aortic root was de-aired and  aortic crossclamp was removed.  Total crossclamp time was 85 minutes.  The patient spontaneously resumed a bradycardic rhythm and did not  require defibrillation.   While rewarming was undertaken, all proximal and distal anastomoses were  inspect for hemostasis.  Epicardial pacing wires were placed on the  right ventricle and right atrium.  DDD pacing was initiated when the  patient rewarmed to a core temperature of 37 degrees Celsius.  He was  weaning from cardiopulmonary bypass on the first attempt without  inotropic  support.  Total bypass time was 130 minutes.  Initial cardiac  index greater than 2 liters per minute per meter squared.  The patient  remained hemodynamically stable throughout post bypass period.   A test dose of protamine was administered as well as tolerated.  The  atrial aortic cannulae were removed.  The remaining protamine was  administered without incident.  The chest was irrigated with 1 liter of  warm normal saline.  Hemostasis was achieved.  The pericardium was  reapproximated with interrupted 3-0 silk sutures and came together  easily without tension.  A left pleural and mediastinal chest tubes were  placed through separate subcostal incisions.  The sternum was closed  with interrupted heavy gauge stainless steel wires.  The pectoralis  fascia, subcutaneous tissue, and skin were closed in standard fashion.  All sponge, needle, and sponge counts were correct at the end of the  procedure.  There were no intraoperative complications and the patient  was taken from the operating room to the surgical intensive care unit in  fair condition.      Salvatore Decent Dorris Fetch, M.D.  Electronically Signed     SCH/MEDQ  D:  05/07/2008  T:  05/08/2008  Job:  [id number]   cc:   Peter M. Clark, M.D.  Kari Baars, M.D.

## 2011-05-05 ENCOUNTER — Encounter: Payer: Self-pay | Admitting: Internal Medicine

## 2011-05-26 ENCOUNTER — Ambulatory Visit (AMBULATORY_SURGERY_CENTER): Payer: Medicare Other | Admitting: *Deleted

## 2011-05-26 ENCOUNTER — Telehealth: Payer: Self-pay | Admitting: Internal Medicine

## 2011-05-26 VITALS — Ht 74.0 in | Wt 197.6 lb

## 2011-05-26 DIAGNOSIS — Z1211 Encounter for screening for malignant neoplasm of colon: Secondary | ICD-10-CM

## 2011-05-26 MED ORDER — PEG-KCL-NACL-NASULF-NA ASC-C 100 G PO SOLR: 1.0000 | Freq: Once | ORAL | Status: DC

## 2011-05-26 NOTE — Telephone Encounter (Signed)
Pt phoned back with MD's name who did last colonoscopy.  Release of information form as filled out at pt's PV.  Completed form given to [facility], Dr.Gessner's CMA

## 2011-06-07 ENCOUNTER — Encounter: Payer: Self-pay | Admitting: Internal Medicine

## 2011-06-09 ENCOUNTER — Encounter: Payer: Self-pay | Admitting: Internal Medicine

## 2011-06-09 ENCOUNTER — Ambulatory Visit (AMBULATORY_SURGERY_CENTER): Payer: Medicare Other | Admitting: Internal Medicine

## 2011-06-09 VITALS — BP 124/66 | HR 62 | Temp 96.3°F | Resp 18 | Ht 74.0 in | Wt 200.0 lb

## 2011-06-09 DIAGNOSIS — Z1211 Encounter for screening for malignant neoplasm of colon: Secondary | ICD-10-CM

## 2011-06-09 DIAGNOSIS — Z800 Family history of malignant neoplasm of digestive organs: Secondary | ICD-10-CM

## 2011-06-09 LAB — GLUCOSE, CAPILLARY
Glucose-Capillary: 119 mg/dL — ABNORMAL HIGH (ref 70–99)
Glucose-Capillary: 107 mg/dL — ABNORMAL HIGH (ref 70–99)

## 2011-06-09 MED ORDER — SODIUM CHLORIDE 0.9 % IV SOLN: 500.0000 mL | INTRAVENOUS | Status: DC

## 2011-06-09 NOTE — Patient Instructions (Signed)
FOLLOW DISCHARGE INSTRUCTIONS (BLUE & GREEN SHEETS)  REPEAT COLONOSCOPY 5 YEARS

## 2011-06-10 ENCOUNTER — Telehealth: Payer: Self-pay | Admitting: *Deleted

## 2011-06-10 NOTE — Telephone Encounter (Signed)

## 2011-07-28 LAB — POCT I-STAT 4, (NA,K, GLUC, HGB,HCT)
Glucose, Bld: 104 — ABNORMAL HIGH
Glucose, Bld: 112 — ABNORMAL HIGH
Glucose, Bld: 117 — ABNORMAL HIGH
HCT: 27 — ABNORMAL LOW
HCT: 30 — ABNORMAL LOW
HCT: 34 — ABNORMAL LOW
HCT: 35 — ABNORMAL LOW
HCT: 36 — ABNORMAL LOW
Hemoglobin: 10.2 — ABNORMAL LOW
Hemoglobin: 11.9 — ABNORMAL LOW
Hemoglobin: 9.2 — ABNORMAL LOW
Operator id: 203371
Operator id: 3291
Operator id: 3291
Operator id: 3291
Operator id: 3291
Potassium: 4.5
Potassium: 4.7
Potassium: 4.8
Sodium: 131 — ABNORMAL LOW
Sodium: 132 — ABNORMAL LOW
Sodium: 135
Sodium: 136

## 2011-07-28 LAB — CBC
HCT: 30.2 — ABNORMAL LOW
Hemoglobin: 11.4 — ABNORMAL LOW
Hemoglobin: 11.6 — ABNORMAL LOW
Hemoglobin: 9.9 — ABNORMAL LOW
MCHC: 34.1
MCV: 89.1
MCV: 89.6
Platelets: 108 — ABNORMAL LOW
Platelets: 166
RBC: 3.26 — ABNORMAL LOW
RBC: 3.37 — ABNORMAL LOW
RBC: 3.7 — ABNORMAL LOW
RBC: 3.82 — ABNORMAL LOW
RDW: 13.7
RDW: 13.8
WBC: 11.9 — ABNORMAL HIGH
WBC: 12.6 — ABNORMAL HIGH
WBC: 13.6 — ABNORMAL HIGH
WBC: 8.3
WBC: 9.5
WBC: 9.7

## 2011-07-28 LAB — POCT I-STAT, CHEM 8
BUN: 14
BUN: 9
Chloride: 98
HCT: 31 — ABNORMAL LOW
HCT: 33 — ABNORMAL LOW
Hemoglobin: 11.2 — ABNORMAL LOW
Sodium: 135
Sodium: 136
TCO2: 24
TCO2: 24

## 2011-07-28 LAB — APTT
aPTT: 122 — ABNORMAL HIGH
aPTT: 34

## 2011-07-28 LAB — BASIC METABOLIC PANEL
BUN: 12
BUN: 9
CO2: 26
Calcium: 8 — ABNORMAL LOW
Calcium: 8.1 — ABNORMAL LOW
Calcium: 8.9
Creatinine, Ser: 0.76
GFR calc Af Amer: >60
GFR calc Af Amer: >60
GFR calc Af Amer: >60
GFR calc non Af Amer: >60
GFR calc non Af Amer: >60
GFR calc non Af Amer: >60
Glucose, Bld: 131 — ABNORMAL HIGH
Potassium: 3.8
Potassium: 4.1
Sodium: 132 — ABNORMAL LOW
Sodium: 134 — ABNORMAL LOW
Sodium: 138

## 2011-07-28 LAB — URINALYSIS, ROUTINE W REFLEX MICROSCOPIC
Bilirubin Urine: NEGATIVE
Ketones, ur: NEGATIVE
Nitrite: NEGATIVE
Specific Gravity, Urine: 1.01
Urobilinogen, UA: 0.2
pH: 5.5

## 2011-07-28 LAB — POCT I-STAT 3, ART BLOOD GAS (G3+)
Bicarbonate: 24.4 — ABNORMAL HIGH
Bicarbonate: 24.8 — ABNORMAL HIGH
Bicarbonate: 27 — ABNORMAL HIGH
Operator id: 305741
Operator id: 3291
TCO2: 26
pCO2 arterial: 38.4
pCO2 arterial: 48.5 — ABNORMAL HIGH
pH, Arterial: 7.353
pH, Arterial: 7.39
pH, Arterial: 7.405
pO2, Arterial: 80

## 2011-07-28 LAB — PROTIME-INR: INR: 1.5

## 2011-07-28 LAB — CREATININE, SERUM
Creatinine, Ser: 0.73
GFR calc Af Amer: >60
GFR calc Af Amer: >60
GFR calc non Af Amer: >60

## 2011-07-28 LAB — HEPARIN LEVEL (UNFRACTIONATED)
Heparin Unfractionated: 0.44
Heparin Unfractionated: 0.5

## 2011-07-28 LAB — POCT I-STAT 3, VENOUS BLOOD GAS (G3P V)
Bicarbonate: 25.5 — ABNORMAL HIGH
O2 Saturation: 81
pCO2, Ven: 50.5 — ABNORMAL HIGH
pO2, Ven: 51 — ABNORMAL HIGH

## 2011-07-28 LAB — ABO/RH: ABO/RH(D): B POS

## 2011-07-28 LAB — TYPE AND SCREEN: ABO/RH(D): B POS

## 2011-07-28 LAB — MAGNESIUM
Magnesium: 2.2
Magnesium: 2.5

## 2011-07-28 LAB — PLATELET COUNT: Platelets: 133 — ABNORMAL LOW

## 2011-07-28 LAB — HEMOGLOBIN AND HEMATOCRIT, BLOOD
HCT: 27.2 — ABNORMAL LOW
Hemoglobin: 9.4 — ABNORMAL LOW

## 2011-07-29 LAB — GLUCOSE, CAPILLARY
Glucose-Capillary: 114 — ABNORMAL HIGH
Glucose-Capillary: 168 — ABNORMAL HIGH

## 2011-08-01 LAB — GLUCOSE, CAPILLARY
Glucose-Capillary: 80
Glucose-Capillary: 103 — ABNORMAL HIGH

## 2011-11-07 DIAGNOSIS — M48061 Spinal stenosis, lumbar region without neurogenic claudication: Secondary | ICD-10-CM | POA: Diagnosis not present

## 2012-01-19 DIAGNOSIS — M76899 Other specified enthesopathies of unspecified lower limb, excluding foot: Secondary | ICD-10-CM | POA: Diagnosis not present

## 2012-01-19 DIAGNOSIS — M48061 Spinal stenosis, lumbar region without neurogenic claudication: Secondary | ICD-10-CM | POA: Diagnosis not present

## 2012-01-31 DIAGNOSIS — M545 Low back pain, unspecified: Secondary | ICD-10-CM | POA: Diagnosis not present

## 2012-02-27 DIAGNOSIS — H9319 Tinnitus, unspecified ear: Secondary | ICD-10-CM | POA: Diagnosis not present

## 2012-02-27 DIAGNOSIS — H902 Conductive hearing loss, unspecified: Secondary | ICD-10-CM | POA: Diagnosis not present

## 2012-02-27 DIAGNOSIS — H6500 Acute serous otitis media, unspecified ear: Secondary | ICD-10-CM | POA: Diagnosis not present

## 2012-04-12 ENCOUNTER — Other Ambulatory Visit: Payer: Self-pay | Admitting: Dermatology

## 2012-04-12 DIAGNOSIS — Z85828 Personal history of other malignant neoplasm of skin: Secondary | ICD-10-CM | POA: Diagnosis not present

## 2012-04-12 DIAGNOSIS — D239 Other benign neoplasm of skin, unspecified: Secondary | ICD-10-CM | POA: Diagnosis not present

## 2012-04-12 DIAGNOSIS — L280 Lichen simplex chronicus: Secondary | ICD-10-CM | POA: Diagnosis not present

## 2012-04-12 DIAGNOSIS — L570 Actinic keratosis: Secondary | ICD-10-CM | POA: Diagnosis not present

## 2012-04-12 DIAGNOSIS — L821 Other seborrheic keratosis: Secondary | ICD-10-CM | POA: Diagnosis not present

## 2012-04-12 DIAGNOSIS — L259 Unspecified contact dermatitis, unspecified cause: Secondary | ICD-10-CM | POA: Diagnosis not present

## 2012-04-16 ENCOUNTER — Other Ambulatory Visit: Payer: Self-pay | Admitting: Internal Medicine

## 2012-04-16 DIAGNOSIS — I714 Abdominal aortic aneurysm, without rupture: Secondary | ICD-10-CM

## 2012-04-19 ENCOUNTER — Encounter: Payer: Self-pay | Admitting: Cardiology

## 2012-05-16 DIAGNOSIS — M76899 Other specified enthesopathies of unspecified lower limb, excluding foot: Secondary | ICD-10-CM | POA: Diagnosis not present

## 2012-06-11 DIAGNOSIS — R109 Unspecified abdominal pain: Secondary | ICD-10-CM | POA: Diagnosis not present

## 2012-06-15 ENCOUNTER — Other Ambulatory Visit: Payer: Medicare Other

## 2012-06-15 ENCOUNTER — Other Ambulatory Visit: Payer: Self-pay | Admitting: Internal Medicine

## 2012-06-15 ENCOUNTER — Ambulatory Visit
Admission: RE | Admit: 2012-06-15 | Discharge: 2012-06-15 | Disposition: A | Discharge status: Home / Self Care | Payer: Medicare Other | Source: Ambulatory Visit | Attending: Internal Medicine | Admitting: Internal Medicine

## 2012-06-15 DIAGNOSIS — I714 Abdominal aortic aneurysm, without rupture: Secondary | ICD-10-CM | POA: Diagnosis not present

## 2012-06-15 DIAGNOSIS — K4090 Unilateral inguinal hernia, without obstruction or gangrene, not specified as recurrent: Secondary | ICD-10-CM | POA: Diagnosis not present

## 2012-06-15 DIAGNOSIS — R7989 Other specified abnormal findings of blood chemistry: Secondary | ICD-10-CM | POA: Diagnosis not present

## 2012-06-15 DIAGNOSIS — R1030 Lower abdominal pain, unspecified: Secondary | ICD-10-CM

## 2012-06-15 MED ORDER — IOHEXOL 350 MG/ML SOLN
125.0000 mL | Freq: Once | INTRAVENOUS | Status: AC | PRN
  Administered 2012-06-15: 125 mL via INTRAVENOUS

## 2012-06-18 DIAGNOSIS — R7989 Other specified abnormal findings of blood chemistry: Secondary | ICD-10-CM | POA: Diagnosis not present

## 2012-06-19 ENCOUNTER — Other Ambulatory Visit: Payer: Self-pay | Admitting: Internal Medicine

## 2012-06-19 ENCOUNTER — Ambulatory Visit
Admission: RE | Admit: 2012-06-19 | Discharge: 2012-06-19 | Disposition: A | Discharge status: Home / Self Care | Payer: Medicare Other | Source: Ambulatory Visit | Attending: Internal Medicine | Admitting: Internal Medicine

## 2012-06-19 DIAGNOSIS — R1011 Right upper quadrant pain: Secondary | ICD-10-CM | POA: Diagnosis not present

## 2012-06-20 ENCOUNTER — Telehealth: Payer: Self-pay | Admitting: Internal Medicine

## 2012-06-20 NOTE — Telephone Encounter (Signed)
Patient is scheduled for Willette Cluster RNP for 06/26/12 1:30

## 2012-06-25 ENCOUNTER — Encounter: Payer: Self-pay | Admitting: Vascular Surgery

## 2012-06-26 ENCOUNTER — Ambulatory Visit (INDEPENDENT_AMBULATORY_CARE_PROVIDER_SITE_OTHER): Payer: Medicare Other | Admitting: Vascular Surgery

## 2012-06-26 ENCOUNTER — Encounter: Payer: Self-pay | Admitting: Vascular Surgery

## 2012-06-26 ENCOUNTER — Encounter: Payer: Self-pay | Admitting: Nurse Practitioner

## 2012-06-26 ENCOUNTER — Ambulatory Visit (INDEPENDENT_AMBULATORY_CARE_PROVIDER_SITE_OTHER): Payer: Medicare Other | Admitting: Nurse Practitioner

## 2012-06-26 VITALS — BP 120/76 | HR 84 | Ht 73.0 in | Wt 194.0 lb

## 2012-06-26 VITALS — BP 153/65 | HR 68 | Resp 16 | Ht 74.0 in | Wt 191.0 lb

## 2012-06-26 DIAGNOSIS — K5909 Other constipation: Secondary | ICD-10-CM

## 2012-06-26 DIAGNOSIS — K559 Vascular disorder of intestine, unspecified: Secondary | ICD-10-CM

## 2012-06-26 DIAGNOSIS — K5900 Constipation, unspecified: Secondary | ICD-10-CM

## 2012-06-26 NOTE — Patient Instructions (Addendum)
We have given you samples of Metamucil to help with regularity.  Stay on a high fiber diet. After 3-4 days if you havn't had a BM you can use an enema.

## 2012-06-26 NOTE — Progress Notes (Addendum)
06/26/2012 [name] [id number] [birth year]   HISTORY OF PRESENT ILLNESS: Patient is a 77 year old male with a lifelong history of constipation. He is known to Dr. Leone Payor and his last colonoscopy was August 2012. Patient feels well today but he was recently worked by (Dr. Arbie Cookey) for abdominal pain. A CTA of abdomen and pelvis as well as an abdominal ultrasound was negative for acute abnormalities. As it turns out, patient had an acute exacerbation of constipation and went 15 days without a bowel movement.  Patient took colace, miralax, and sorbitol. He eventually had a bowel movement on Monday of this week and pain immediately subsided. Patient hasn't taken anything for constipation since, he was waiting for our evaluation. Patient plans to start eating more fruits and vegetables, he inquires about [employer].    Past Medical History  Diagnosis Date  . Cataract   . Diabetes mellitus   . Hyperlipidemia   . Hypertension   . Helicobacter pylori gastritis 01/2006, 03/2011    EGD - Pylera Tx 2010  . CAD (coronary artery disease)    Past Surgical History  Procedure Date  . Total knee arthroplasty 07-13-2012    right  . Hernia repair 12/24/2010  . Cataract extraction     right  . Colonoscopy 2002, 2007, 06/09/2011    Dr. Marcha Dutton - [name], [place]: for FHx colon cancer, no adenomas; 2012: [name] - normal  . Esophagogastroduodenoscopy 02/20/06    Dr. Marcha Dutton, [name], [place]:H. pylori gastritis and GERD changes (pathology)  . Upper gastrointestinal endoscopy 03/09/2009    Dr. Leone Payor: H. pylori gastritis (Pylera Tx) and 2 cm hiatal hernia  . Coronary artery bypass graft 05-07-2008    reports that he has been smoking Cigarettes.  He has been smoking about .5 packs per day. He has never used smokeless tobacco. He reports that he does not drink alcohol or use illicit drugs. family history includes Cancer in his brother and sisters; Diabetes in his brother, father, and sister; Heart attack in his brother,  father, and sister; Heart disease in his brother, daughter, and father; Hyperlipidemia in his brother and daughter; Hypertension in his brother and daughter; and Other in his brother and sister.  There is no history of Colon cancer. Allergies  Allergen Reactions  . Sulfonamide Derivatives       Outpatient Encounter Prescriptions as of 06/26/2012  Medication Sig Dispense Refill  . aspirin 81 MG tablet Take 81 mg by mouth daily.        . Coenzyme Q10 (CO Q-10 PO) Take 300 mg by mouth. 3 times a week       . LIPITOR 20 MG tablet Take 10 mg by mouth daily.      . metFORMIN (GLUCOPHAGE) 1000 MG tablet 1 tablet Daily.      . metoprolol succinate (TOPROL-XL) 25 MG 24 hr tablet Take 25 mg by mouth daily.      . ramipril (ALTACE) 5 MG capsule 1 tablet Daily.      Marland Kitchen DISCONTD: rosuvastatin (CRESTOR) 10 MG tablet Take 10 mg by mouth. 3 times a week          REVIEW OF SYSTEMS  : All other systems reviewed and negative except where noted in the History of Present Illness.   PHYSICAL EXAM: BP 120/76  Pulse 84  Ht 6\' 1"  (1.854 m)  Wt 194 lb (87.998 kg)  BMI 25.60 kg/m2 General: Well developed white emale in no acute distress Head: Normocephalic and atraumatic Eyes:  sclerae anicteric,conjunctive  pink. Ears: Normal auditory acuity Neck: Supple, no masses.  Lungs: Clear throughout to auscultation Heart: Regular rate and rhythm Abdomen: Soft, non distended, nontender. No masses or hepatomegaly noted. Normal Bowel sounds Musculoskeletal: Symmetrical with no gross deformities  Skin: No lesions on visible extremities Extremities: No edema or deformities noted Neurological: Alert oriented x 4, grossly nonfocal Cervical Nodes:  No significant cervical adenopathy Psychological:  Alert and cooperative. Normal mood and affect  ASSESSMENT AND PLAN: 1.  Acute on chronic constipation with lower abdominal pain. After bowel evacuation pain totally subsided. We discussed risk factors for constipation  (sedentary lifestyles, dehydration, pain medication, etc...). Patient needs a bowel regimen.Marland Kitchen  He will continue high fiber diet, samples of Metamucil given. He should should use enemas as needed. Goal is to not let more than 3 days pass with a bowel movement.   2. atherosclerotic disease of the abdominal aorta and branch vessels without evidence of mesenteric ischemia on CTA of abdomen and pelvis.   Addendum: Reviewed and agree with initial management. Beverley Fiedler, MD

## 2012-06-26 NOTE — Progress Notes (Signed)
[facility]   Patient name: [name] MRN: [id number] DOB: July 10, 1935 Sex: male   Referred by: Clelia Croft  Reason for referral:  Chief Complaint  Patient presents with  . Ischemia    eval for mesenteric ishemia/Dr. Clelia Croft     HISTORY OF PRESENT ILLNESS: Patient is a 77 year old gentleman seen for evaluation of abdominal discomfort. He reports lifelong history of irregular bowel movements going days without bowel movements. He reports that this can occasionally be up to 2 weeks. When this happens he can have some periumbilical abdominal pain. He had a CT scan for further evaluation of this to rule out mesenteric ischemia. He reports that he is on a new medication to improve his bowel regimen and has had no pain associated with this following this. This is was described as crampy and can be severe up to a 6 or 7/10 at times. He does have prior history of coronary artery bypass grafting but denies any history of stroke or lower surety arterial claudication  Past Medical History  Diagnosis Date  . Cataract   . Diabetes mellitus   . Hyperlipidemia   . Hypertension   . Helicobacter pylori gastritis 01/2006, 03/2011    EGD - Pylera Tx 2010  . CAD (coronary artery disease)     Past Surgical History  Procedure Date  . Total knee arthroplasty 07-13-2012    right  . Hernia repair 12/24/2010  . Cataract extraction     right  . Colonoscopy 2002, 2007, 06/09/2011    Dr. Marcha Dutton - [name], [place]: for FHx colon cancer, no adenomas; 2012: Gessner - normal  . Esophagogastroduodenoscopy 02/20/06    Dr. Marcha Dutton, [name], [place]:H. pylori gastritis and GERD changes (pathology)  . Upper gastrointestinal endoscopy 03/09/2009    Dr. Leone Payor: H. pylori gastritis (Pylera Tx) and 2 cm hiatal hernia  . Coronary artery bypass graft 05-07-2008    History   Social History  . Marital Status: Married    Spouse Name: N/A    Number of Children: N/A  . Years of Education: N/A    Occupational History  . Not on file.   Social History Main Topics  . Smoking status: Current Everyday Smoker -- 0.5 packs/day    Types: Cigarettes  . Smokeless tobacco: Never Used  . Alcohol Use: No  . Drug Use: No  . Sexually Active: Not on file   Other Topics Concern  . Not on file   Social History Narrative  . No narrative on file    Family History  Problem Relation Age of Onset  . Colon cancer Neg Hx   . Heart disease Father   . Diabetes Father   . Heart attack Father   . Cancer Sister     colon  . Diabetes Sister   . Other Sister     varicose veins  . Heart attack Sister   . Cancer Brother     prostate  . Diabetes Brother   . Heart disease Brother   . Hyperlipidemia Brother   . Hypertension Brother   . Other Brother     varicose veins  . Heart attack Brother   . Heart disease Daughter   . Hyperlipidemia Daughter   . Hypertension Daughter   . Cancer Sister     lukemia    Allergies as of 06/26/2012 - Review Complete 06/26/2012  Allergen Reaction Noted  . Sulfonamide derivatives  03/12/2009    Current Outpatient Prescriptions on File Prior to Visit  Medication Sig Dispense Refill  . aspirin 81 MG tablet Take 81 mg by mouth daily.        . Coenzyme Q10 (CO Q-10 PO) Take 300 mg by mouth. 3 times a week       . LIPITOR 20 MG tablet Take 10 mg by mouth daily.      . metFORMIN (GLUCOPHAGE) 1000 MG tablet 1 tablet Daily.      . ramipril (ALTACE) 5 MG capsule 1 tablet Daily.      . rosuvastatin (CRESTOR) 10 MG tablet Take 10 mg by mouth. 3 times a week          REVIEW OF SYSTEMS:  Positives indicated with an "X"  CARDIOVASCULAR:  [ ]  chest pain   [ ]  chest pressure   [ ]  palpitations   [ ]  orthopnea   [ ]  dyspnea on exertion   [ ]  claudication   [ ]  rest pain   [ ]  DVT   [ ]  phlebitis PULMONARY:   [ ]  productive cough   [ ]  asthma   [ ]  wheezing NEUROLOGIC:   [ ]  weakness  [ ]  paresthesias  [ ]  aphasia  [ ]  amaurosis  [ ]  dizziness HEMATOLOGIC:   [  ] bleeding problems   [ ]  clotting disorders MUSCULOSKELETAL:  [ ]  joint pain   [ ]  joint swelling GASTROINTESTINAL: [ ]   blood in stool  [ ]   hematemesis GENITOURINARY:  [ ]   dysuria  [ ]   hematuria PSYCHIATRIC:  [ ]  history of major depression INTEGUMENTARY:  [ ]  rashes  [ ]  ulcers CONSTITUTIONAL:  [ ]  fever   [ ]  chills  PHYSICAL EXAMINATION:  General: The patient is a well-nourished male, in no acute distress. Vital signs are BP 153/65  Pulse 68  Resp 16  Ht 6\' 2"  (1.88 m)  Wt 191 lb (86.637 kg)  BMI 24.52 kg/m2  SpO2 100% Pulmonary: There is a good air exchange bilaterally without wheezing or rales. Abdomen: Soft and non-tender with normal pitch bowel sounds. Do not palpate an aneurysm. Musculoskeletal: There are no major deformities.  There is no significant extremity pain. Neurologic: No focal weakness or paresthesias are detected, Skin: There are no ulcer or rashes noted. Psychiatric: The patient has normal affect. Cardiovascular: There is a regular rate and rhythm without significant murmur appreciated. Carotid arteries without bruits bilaterally Pulse status 2+ radial and 2+ dorsalis pedis pulses  He did undergo CT scan on 06/15/2012 and had this for review. I did review the report in the actual films. He has wide patency of the celiac, superior mesenteric artery and inferior mesenteric arteries. He does have ectasia of his infrarenal aorta with a small aneurysm up to 3.1 cm  Impression and Plan:  No evidence of mesenteric ischemia with widely patent vessels to his intestines. He does have a small aneurysm. I suggested that he undergo ultrasound on a yearly basis to rule out progression in size. I did explain that the threshold for elective repair is typically 5-5-1/2 cm. He will arrange followup ultrasound through Dr. Alver Fisher office. We will see him on an as-needed basis    EARLY, TODD [facility] [place] Office: [phone]

## 2012-06-27 DIAGNOSIS — K5909 Other constipation: Secondary | ICD-10-CM | POA: Insufficient documentation

## 2012-07-13 HISTORY — PX: TOTAL KNEE ARTHROPLASTY: SHX125

## 2012-07-16 DIAGNOSIS — Z23 Encounter for immunization: Secondary | ICD-10-CM | POA: Diagnosis not present

## 2012-08-31 DIAGNOSIS — Z961 Presence of intraocular lens: Secondary | ICD-10-CM | POA: Diagnosis not present

## 2012-08-31 DIAGNOSIS — E119 Type 2 diabetes mellitus without complications: Secondary | ICD-10-CM | POA: Diagnosis not present

## 2012-08-31 DIAGNOSIS — H01009 Unspecified blepharitis unspecified eye, unspecified eyelid: Secondary | ICD-10-CM | POA: Diagnosis not present

## 2012-08-31 DIAGNOSIS — H43819 Vitreous degeneration, unspecified eye: Secondary | ICD-10-CM | POA: Diagnosis not present

## 2012-09-04 ENCOUNTER — Ambulatory Visit: Payer: Medicare Other

## 2012-09-04 ENCOUNTER — Encounter: Payer: Self-pay | Admitting: Cardiology

## 2012-09-04 DIAGNOSIS — I10 Essential (primary) hypertension: Secondary | ICD-10-CM | POA: Diagnosis not present

## 2012-09-04 DIAGNOSIS — Z125 Encounter for screening for malignant neoplasm of prostate: Secondary | ICD-10-CM | POA: Diagnosis not present

## 2012-09-04 DIAGNOSIS — E785 Hyperlipidemia, unspecified: Secondary | ICD-10-CM | POA: Diagnosis not present

## 2012-09-04 DIAGNOSIS — I2510 Atherosclerotic heart disease of native coronary artery without angina pectoris: Secondary | ICD-10-CM | POA: Diagnosis not present

## 2012-09-04 DIAGNOSIS — R7989 Other specified abnormal findings of blood chemistry: Secondary | ICD-10-CM | POA: Diagnosis not present

## 2012-09-04 DIAGNOSIS — E1149 Type 2 diabetes mellitus with other diabetic neurological complication: Secondary | ICD-10-CM | POA: Diagnosis not present

## 2012-09-10 DIAGNOSIS — G609 Hereditary and idiopathic neuropathy, unspecified: Secondary | ICD-10-CM | POA: Diagnosis not present

## 2012-09-10 DIAGNOSIS — Z1331 Encounter for screening for depression: Secondary | ICD-10-CM | POA: Diagnosis not present

## 2012-09-10 DIAGNOSIS — F17200 Nicotine dependence, unspecified, uncomplicated: Secondary | ICD-10-CM | POA: Diagnosis not present

## 2012-09-10 DIAGNOSIS — R5381 Other malaise: Secondary | ICD-10-CM | POA: Diagnosis not present

## 2012-09-10 DIAGNOSIS — Z0000 Encounter for general adult medical examination without abnormal findings: Secondary | ICD-10-CM | POA: Diagnosis not present

## 2012-09-10 DIAGNOSIS — I739 Peripheral vascular disease, unspecified: Secondary | ICD-10-CM | POA: Diagnosis not present

## 2012-09-10 DIAGNOSIS — E1159 Type 2 diabetes mellitus with other circulatory complications: Secondary | ICD-10-CM | POA: Diagnosis not present

## 2012-09-10 DIAGNOSIS — R5383 Other fatigue: Secondary | ICD-10-CM | POA: Diagnosis not present

## 2012-09-12 ENCOUNTER — Ambulatory Visit (INDEPENDENT_AMBULATORY_CARE_PROVIDER_SITE_OTHER): Payer: Medicare Other | Admitting: *Deleted

## 2012-09-12 DIAGNOSIS — I739 Peripheral vascular disease, unspecified: Secondary | ICD-10-CM | POA: Diagnosis not present

## 2012-09-14 DIAGNOSIS — M76899 Other specified enthesopathies of unspecified lower limb, excluding foot: Secondary | ICD-10-CM | POA: Diagnosis not present

## 2012-10-01 DIAGNOSIS — IMO0002 Reserved for concepts with insufficient information to code with codable children: Secondary | ICD-10-CM | POA: Diagnosis not present

## 2012-10-01 DIAGNOSIS — M48061 Spinal stenosis, lumbar region without neurogenic claudication: Secondary | ICD-10-CM | POA: Diagnosis not present

## 2012-10-01 DIAGNOSIS — M4310 Spondylolisthesis, site unspecified: Secondary | ICD-10-CM | POA: Diagnosis not present

## 2012-10-05 ENCOUNTER — Other Ambulatory Visit: Payer: Self-pay | Admitting: Neurological Surgery

## 2012-10-05 DIAGNOSIS — M549 Dorsalgia, unspecified: Secondary | ICD-10-CM

## 2012-10-11 ENCOUNTER — Other Ambulatory Visit: Payer: Self-pay | Admitting: Neurological Surgery

## 2012-10-11 ENCOUNTER — Ambulatory Visit
Admission: RE | Admit: 2012-10-11 | Discharge: 2012-10-11 | Disposition: A | Discharge status: Home / Self Care | Payer: Medicare Other | Source: Ambulatory Visit | Attending: Neurological Surgery | Admitting: Neurological Surgery

## 2012-10-11 ENCOUNTER — Inpatient Hospital Stay
Admission: RE | Admit: 2012-10-11 | Discharge: 2012-10-11 | Disposition: A | Discharge status: Home / Self Care | Payer: Self-pay | Source: Ambulatory Visit | Attending: Neurological Surgery | Admitting: Neurological Surgery

## 2012-10-11 DIAGNOSIS — M5126 Other intervertebral disc displacement, lumbar region: Secondary | ICD-10-CM | POA: Diagnosis not present

## 2012-10-11 DIAGNOSIS — M48061 Spinal stenosis, lumbar region without neurogenic claudication: Secondary | ICD-10-CM | POA: Diagnosis not present

## 2012-10-11 DIAGNOSIS — M4310 Spondylolisthesis, site unspecified: Secondary | ICD-10-CM | POA: Diagnosis not present

## 2012-10-11 DIAGNOSIS — M549 Dorsalgia, unspecified: Secondary | ICD-10-CM

## 2012-10-11 DIAGNOSIS — M47817 Spondylosis without myelopathy or radiculopathy, lumbosacral region: Secondary | ICD-10-CM | POA: Diagnosis not present

## 2012-10-11 MED ORDER — IOHEXOL 180 MG/ML  SOLN
18.0000 mL | Freq: Once | INTRAMUSCULAR | Status: AC | PRN
  Administered 2012-10-11: 18 mL via INTRATHECAL

## 2012-10-11 NOTE — Progress Notes (Signed)
Pt's family at bedside. Explained discharge instructions to the wife.

## 2012-10-11 NOTE — Progress Notes (Signed)
Pt declined valium.

## 2012-10-19 DIAGNOSIS — IMO0002 Reserved for concepts with insufficient information to code with codable children: Secondary | ICD-10-CM | POA: Diagnosis not present

## 2012-11-07 DIAGNOSIS — M48061 Spinal stenosis, lumbar region without neurogenic claudication: Secondary | ICD-10-CM | POA: Diagnosis not present

## 2012-11-07 DIAGNOSIS — M47817 Spondylosis without myelopathy or radiculopathy, lumbosacral region: Secondary | ICD-10-CM | POA: Diagnosis not present

## 2012-11-07 DIAGNOSIS — M5137 Other intervertebral disc degeneration, lumbosacral region: Secondary | ICD-10-CM | POA: Diagnosis not present

## 2012-11-07 DIAGNOSIS — Q762 Congenital spondylolisthesis: Secondary | ICD-10-CM | POA: Diagnosis not present

## 2012-11-22 DIAGNOSIS — M47817 Spondylosis without myelopathy or radiculopathy, lumbosacral region: Secondary | ICD-10-CM | POA: Diagnosis not present

## 2012-11-22 DIAGNOSIS — M48061 Spinal stenosis, lumbar region without neurogenic claudication: Secondary | ICD-10-CM | POA: Diagnosis not present

## 2012-11-22 DIAGNOSIS — M5137 Other intervertebral disc degeneration, lumbosacral region: Secondary | ICD-10-CM | POA: Diagnosis not present

## 2012-11-22 DIAGNOSIS — M4310 Spondylolisthesis, site unspecified: Secondary | ICD-10-CM | POA: Diagnosis not present

## 2012-11-30 DIAGNOSIS — E1149 Type 2 diabetes mellitus with other diabetic neurological complication: Secondary | ICD-10-CM | POA: Diagnosis not present

## 2012-11-30 DIAGNOSIS — Z1331 Encounter for screening for depression: Secondary | ICD-10-CM | POA: Diagnosis not present

## 2012-11-30 DIAGNOSIS — M549 Dorsalgia, unspecified: Secondary | ICD-10-CM | POA: Diagnosis not present

## 2012-11-30 DIAGNOSIS — E1159 Type 2 diabetes mellitus with other circulatory complications: Secondary | ICD-10-CM | POA: Diagnosis not present

## 2012-12-31 DIAGNOSIS — E1159 Type 2 diabetes mellitus with other circulatory complications: Secondary | ICD-10-CM | POA: Diagnosis not present

## 2012-12-31 DIAGNOSIS — E1149 Type 2 diabetes mellitus with other diabetic neurological complication: Secondary | ICD-10-CM | POA: Diagnosis not present

## 2012-12-31 DIAGNOSIS — G609 Hereditary and idiopathic neuropathy, unspecified: Secondary | ICD-10-CM | POA: Diagnosis not present

## 2012-12-31 DIAGNOSIS — I714 Abdominal aortic aneurysm, without rupture: Secondary | ICD-10-CM | POA: Diagnosis not present

## 2013-01-11 DIAGNOSIS — M48061 Spinal stenosis, lumbar region without neurogenic claudication: Secondary | ICD-10-CM | POA: Diagnosis not present

## 2013-01-15 DIAGNOSIS — M2010 Hallux valgus (acquired), unspecified foot: Secondary | ICD-10-CM | POA: Diagnosis not present

## 2013-01-15 DIAGNOSIS — E119 Type 2 diabetes mellitus without complications: Secondary | ICD-10-CM | POA: Diagnosis not present

## 2013-01-15 DIAGNOSIS — M722 Plantar fascial fibromatosis: Secondary | ICD-10-CM | POA: Diagnosis not present

## 2013-01-24 ENCOUNTER — Encounter: Payer: Self-pay | Admitting: Cardiology

## 2013-01-24 ENCOUNTER — Ambulatory Visit (INDEPENDENT_AMBULATORY_CARE_PROVIDER_SITE_OTHER): Payer: Medicare Other | Admitting: Cardiology

## 2013-01-24 VITALS — BP 140/70 | HR 60 | Ht 73.0 in | Wt 196.1 lb

## 2013-01-24 DIAGNOSIS — I739 Peripheral vascular disease, unspecified: Secondary | ICD-10-CM | POA: Diagnosis not present

## 2013-01-24 DIAGNOSIS — Z951 Presence of aortocoronary bypass graft: Secondary | ICD-10-CM | POA: Insufficient documentation

## 2013-01-24 DIAGNOSIS — I10 Essential (primary) hypertension: Secondary | ICD-10-CM | POA: Insufficient documentation

## 2013-01-24 DIAGNOSIS — E785 Hyperlipidemia, unspecified: Secondary | ICD-10-CM

## 2013-01-24 DIAGNOSIS — I2510 Atherosclerotic heart disease of native coronary artery without angina pectoris: Secondary | ICD-10-CM

## 2013-01-24 NOTE — Progress Notes (Signed)
[name] Date of Birth: [birth year] Medical Record #[id number]  History of Present Illness: [name] is seen at the request of Dr. Clelia Croft. He is a pleasant 78 year old white male with history of coronary disease. He was last seen several years ago. He presented with unstable angina in 2005 and cardiac catheterization demonstrated severe left main and three-vessel coronary disease. He underwent coronary bypass surgery by Dr. Dorris Fetch. Since then he has done very well from a cardiac standpoint. He really denies any significant symptoms of chest pain or shortness of breath. Occasionally he gets mild chest discomfort when he is lying in bed but quickly goes away. He has had no exertional symptoms. He goes to the gym 3 days a week and also walks on trails 2 days a week. He is limited by chronic back problems. He also has peripheral arterial disease and had abnormal lower extremity Dopplers this past year which showed ankle brachial indices of 0.69. He continues to smoke 5 cigarettes per day. He reports his diabetes and cholesterol have been well controlled.  Current Outpatient Prescriptions on File Prior to Visit  Medication Sig Dispense Refill  . aspirin 81 MG tablet Take 81 mg by mouth daily.        . Coenzyme Q10 (CO Q-10 PO) Take 300 mg by mouth. 3 times a week       . metFORMIN (GLUCOPHAGE) 1000 MG tablet 1 tablet Daily.      . metoprolol succinate (TOPROL-XL) 25 MG 24 hr tablet Take 25 mg by mouth daily.      . ramipril (ALTACE) 5 MG capsule 1 tablet Daily.       No current facility-administered medications on file prior to visit.    Allergies  Allergen Reactions  . Sulfonamide Derivatives     Past Medical History  Diagnosis Date  . Cataract   . Diabetes mellitus   . Hyperlipidemia   . Hypertension   . Helicobacter pylori gastritis 01/2006, 03/2011    EGD - Pylera Tx 2010  . CAD (coronary artery disease)   . PAD (peripheral artery disease)     Past Surgical History  Procedure  Laterality Date  . Total knee arthroplasty  07-13-2012    right  . Hernia repair  12/24/2010  . Cataract extraction      right  . Colonoscopy  2002, 2007, 06/09/2011    Dr. Marcha Dutton - [name], [place]: for FHx colon cancer, no adenomas; 2012: [facility]ner - normal  . Esophagogastroduodenoscopy  02/20/06    Dr. Marcha Dutton, [name], [place]:H. pylori gastritis and GERD changes (pathology)  . Upper gastrointestinal endoscopy  03/09/2009    Dr. Leone Payor: H. pylori gastritis (Pylera Tx) and 2 cm hiatal hernia  . Coronary artery bypass graft  05-07-2008    History  Smoking status  . Current Every Day Smoker -- 0.50 packs/day  . Types: Cigarettes  Smokeless tobacco  . Never Used    History  Alcohol Use No    Family History  Problem Relation Age of Onset  . Colon cancer Neg Hx   . Heart disease Father   . Diabetes Father   . Heart attack Father   . Cancer Sister     colon  . Diabetes Sister   . Other Sister     varicose veins  . Heart attack Sister   . Cancer Brother     prostate  . Diabetes Brother   . Heart disease Brother   . Hyperlipidemia Brother   . Hypertension  Brother   . Other Brother     varicose veins  . Heart attack Brother   . Heart disease Daughter   . Hyperlipidemia Daughter   . Hypertension Daughter   . Cancer Sister     lukemia    Review of Systems: The review of systems is positive for  Lower extremity claudication in the calf. He reports he has had total knee replacement on the right. All other systems were reviewed and are negative.  Physical Exam: BP 140/70  Pulse 60  Ht 6\' 1"  (1.854 m)  Wt 196 lb 1.9 oz (88.959 kg)  BMI 25.88 kg/m2 He is a pleasant, elderly white male in no acute distress. HEENT: Normocephalic, atraumatic. Pupils are equal round and reactive to light accommodation. Sclera clear. Oropharynx is clear. Dentition is in good repair. Neck is supple without JVD, adenopathy, or thyromegaly. There is a right carotid bruit. Lungs:  Clear Cardiovascular: Regular rate and rhythm, normal S1 and S2, no gallop, murmur, or click. PMI is normal. Abdomen: Soft and nontender. Normal bowel sounds. No masses or hepatosplenomegaly. Extremities: Pedal pulses are 1+ and symmetric. He has no edema. Skin: Warm and dry Neuro: Alert and oriented x3 cranial nerves II through XII are intact.  LABORATORY DATA: ECG today demonstrates sinus bradycardia with a rate of 59 beats per minute with first-degree AV block. It is otherwise normal. Laboratory data from a 09/04/2012 showed a blood sugar of 134. All other chemistries were normal. CBC was normal. Total cholesterol 154, triglycerides 76, HDL 45, LDL 94. A1c was 6.7%. TSH 1.57.  Assessment / Plan: 1. Coronary disease status post CABG in 2009. Currently asymptomatic. Exertion is limited by his chronic back pain and claudication. Have recommended a Lexiscan Myoview study to followup on his coronary disease. Continue aspirin, ACE inhibitor, and metoprolol.  2. Diabetes mellitus type 2. On metformin.  3. Hypertension, controlled.  4. Hyperlipidemia. Patient reports taking Crestor 5 mg 3 days a week.  5. Peripheral arterial disease with stable claudication. Recommend smoking cessation and regular walking program.

## 2013-01-24 NOTE — Patient Instructions (Signed)
Stop smoking completely  We will schedule you for a nuclear stress test.

## 2013-01-31 ENCOUNTER — Ambulatory Visit (HOSPITAL_COMMUNITY): Payer: Medicare Other | Attending: Cardiology | Admitting: Radiology

## 2013-01-31 VITALS — BP 134/81 | HR 51 | Ht 73.0 in | Wt 194.0 lb

## 2013-01-31 DIAGNOSIS — I10 Essential (primary) hypertension: Secondary | ICD-10-CM

## 2013-01-31 DIAGNOSIS — I2510 Atherosclerotic heart disease of native coronary artery without angina pectoris: Secondary | ICD-10-CM

## 2013-01-31 DIAGNOSIS — I4949 Other premature depolarization: Secondary | ICD-10-CM

## 2013-01-31 DIAGNOSIS — I739 Peripheral vascular disease, unspecified: Secondary | ICD-10-CM

## 2013-01-31 DIAGNOSIS — R079 Chest pain, unspecified: Secondary | ICD-10-CM | POA: Diagnosis not present

## 2013-01-31 DIAGNOSIS — R0789 Other chest pain: Secondary | ICD-10-CM | POA: Insufficient documentation

## 2013-01-31 DIAGNOSIS — E785 Hyperlipidemia, unspecified: Secondary | ICD-10-CM

## 2013-01-31 MED ORDER — TECHNETIUM TC 99M SESTAMIBI GENERIC - CARDIOLITE
11.0000 | Freq: Once | INTRAVENOUS | Status: AC | PRN
Start: 2013-01-31 — End: 2013-01-31
  Administered 2013-01-31: 33 via INTRAVENOUS

## 2013-01-31 MED ORDER — REGADENOSON 0.4 MG/5ML IV SOLN
0.4000 mg | Freq: Once | INTRAVENOUS | Status: AC
  Administered 2013-01-31: 0.4 mg via INTRAVENOUS

## 2013-01-31 MED ORDER — TECHNETIUM TC 99M SESTAMIBI GENERIC - CARDIOLITE
10.8000 | Freq: Once | INTRAVENOUS | Status: AC | PRN
  Administered 2013-01-31: 11 via INTRAVENOUS

## 2013-01-31 NOTE — Progress Notes (Signed)
[facility] SITE 3 NUCLEAR MED [address] [place], [place] [address] [phone]    Cardiology Nuclear Med Study  [name] is a 78 y.o. male     MRN : [id number]     DOB: [birth year]  Procedure Date: 01/31/2013  Nuclear Med Background Indication for Stress Test:  Evaluation for Ischemia and Graft Patency History:  '08 OZH:[id number] ischemia, EF=60%; '09 CABG Cardiac Risk Factors: Claudication, Family History - CAD, Hypertension, Lipids, NIDDM, PVD and Smoker  Symptoms:  Chest Pressure.  (last episode of chest discomfort was about 2-days ago)   Nuclear Pre-Procedure Caffeine/Decaff Intake:  None NPO After: 7:00pm   Lungs:  Clear. O2 Sat: 98% on room air. IV 0.9% NS with Angio Cath:  22g  IV Site: R Forearm  IV Started by:  Stanton Kidney, EMT-P  Chest Size (in):  44 Cup Size: n/a  Height: 6\' 1"  (1.854 m)  Weight:  194 lb (87.998 kg)  BMI:  Body mass index is 25.6 kg/(m^2). Tech Comments:  CBG=130 this am, per patient.    Nuclear Med Study 1 or 2 day study: 1 day  Stress Test Type:  Eugenie Birks  Reading MD: Marca Ancona, MD  Order Authorizing Provider:  Peter Swaziland, MD  Resting Radionuclide: Technetium 53m Sestamibi  Resting Radionuclide Dose: 11.0 mCi   Stress Radionuclide:  Technetium 33m Sestamibi  Stress Radionuclide Dose: 33.0 mCi           Stress Protocol Rest HR: 51 Stress HR: 120  Rest BP: 134/81 Stress BP: 141/71  Exercise Time (min): n/a METS: n/a   Predicted Max HR: 144 bpm % Max HR: 83.33 bpm Rate Pressure Product: 28413   Dose of Adenosine (mg):  n/a Dose of Lexiscan: 0.4 mg  Dose of Atropine (mg): n/a Dose of Dobutamine: n/a mcg/kg/min (at max HR)  Stress Test Technologist: Smiley Houseman, CMA-N  Nuclear Technologist:  Domenic Polite, CNMT     Rest Procedure:  Myocardial perfusion imaging was performed at rest 45 minutes following the intravenous administration of Technetium 57m Sestamibi.  Rest ECG: NSR - Normal EKG  Stress  Procedure:  The patient received IV Lexiscan 0.4 mg over 15-seconds.  He c/o chest pressure in recovery.  Technetium 25m Sestamibi injected at 30-seconds.  Quantitative spect images were obtained after a 45 minute delay.  Stress ECG: No significant change from baseline ECG.  Short SVT runs.   QPS Raw Data Images:  Normal; no motion artifact; normal heart/lung ratio. Stress Images:  Small, mild basal inferior perfusion defect.  Rest Images:  Small, mild basal inferior perfusion defect.  Subtraction (SDS):  Fixed small, mild basal inferior perfusion defect.  Transient Ischemic Dilatation (Normal <1.22):  0.99 Lung/Heart Ratio (Normal <0.45):  0.31  Quantitative Gated Spect Images QGS EDV:  99 ml QGS ESV:  35 ml  Impression Exercise Capacity:  Lexiscan with no exercise. BP Response:  Normal blood pressure response. Clinical Symptoms:  Chest pressure.  ECG Impression:  No significant ST segment change suggestive of ischemia. Short SVT runs.  Comparison with Prior Nuclear Study: No images to compare  Overall Impression:  Low risk stress nuclear study.  Small, mild fixed basal inferior perfusion defect.  Given normal wall motion, this may be due to diaphragmatic attenuation.   LV Ejection Fraction: 64%.  LV Wall Motion:  NL LV Function; NL Wall Motion  Marca Ancona 01/31/2013

## 2013-02-06 ENCOUNTER — Encounter: Payer: Self-pay | Admitting: Cardiology

## 2013-02-12 ENCOUNTER — Encounter (HOSPITAL_COMMUNITY): Payer: Medicare Other

## 2013-02-12 DIAGNOSIS — M722 Plantar fascial fibromatosis: Secondary | ICD-10-CM | POA: Diagnosis not present

## 2013-03-01 DIAGNOSIS — M76899 Other specified enthesopathies of unspecified lower limb, excluding foot: Secondary | ICD-10-CM | POA: Diagnosis not present

## 2013-03-12 ENCOUNTER — Ambulatory Visit: Payer: Medicare Other | Admitting: Cardiology

## 2013-04-03 DIAGNOSIS — M76899 Other specified enthesopathies of unspecified lower limb, excluding foot: Secondary | ICD-10-CM | POA: Diagnosis not present

## 2013-04-03 DIAGNOSIS — M48061 Spinal stenosis, lumbar region without neurogenic claudication: Secondary | ICD-10-CM | POA: Diagnosis not present

## 2013-04-03 DIAGNOSIS — Z96659 Presence of unspecified artificial knee joint: Secondary | ICD-10-CM | POA: Diagnosis not present

## 2013-04-11 DIAGNOSIS — M48061 Spinal stenosis, lumbar region without neurogenic claudication: Secondary | ICD-10-CM | POA: Diagnosis not present

## 2013-04-24 DIAGNOSIS — M48061 Spinal stenosis, lumbar region without neurogenic claudication: Secondary | ICD-10-CM | POA: Diagnosis not present

## 2013-05-07 DIAGNOSIS — I2510 Atherosclerotic heart disease of native coronary artery without angina pectoris: Secondary | ICD-10-CM | POA: Diagnosis not present

## 2013-05-07 DIAGNOSIS — Z951 Presence of aortocoronary bypass graft: Secondary | ICD-10-CM | POA: Diagnosis not present

## 2013-05-07 DIAGNOSIS — M549 Dorsalgia, unspecified: Secondary | ICD-10-CM | POA: Diagnosis not present

## 2013-05-07 DIAGNOSIS — I739 Peripheral vascular disease, unspecified: Secondary | ICD-10-CM | POA: Diagnosis not present

## 2013-05-07 DIAGNOSIS — E785 Hyperlipidemia, unspecified: Secondary | ICD-10-CM | POA: Diagnosis not present

## 2013-05-07 DIAGNOSIS — E1159 Type 2 diabetes mellitus with other circulatory complications: Secondary | ICD-10-CM | POA: Diagnosis not present

## 2013-05-07 DIAGNOSIS — I10 Essential (primary) hypertension: Secondary | ICD-10-CM | POA: Diagnosis not present

## 2013-05-07 DIAGNOSIS — E1149 Type 2 diabetes mellitus with other diabetic neurological complication: Secondary | ICD-10-CM | POA: Diagnosis not present

## 2013-05-17 DIAGNOSIS — M4310 Spondylolisthesis, site unspecified: Secondary | ICD-10-CM | POA: Diagnosis not present

## 2013-05-17 DIAGNOSIS — M47817 Spondylosis without myelopathy or radiculopathy, lumbosacral region: Secondary | ICD-10-CM | POA: Diagnosis not present

## 2013-05-17 DIAGNOSIS — M5137 Other intervertebral disc degeneration, lumbosacral region: Secondary | ICD-10-CM | POA: Diagnosis not present

## 2013-05-17 DIAGNOSIS — M48061 Spinal stenosis, lumbar region without neurogenic claudication: Secondary | ICD-10-CM | POA: Diagnosis not present

## 2013-05-23 DIAGNOSIS — M4310 Spondylolisthesis, site unspecified: Secondary | ICD-10-CM | POA: Diagnosis not present

## 2013-05-23 DIAGNOSIS — M5137 Other intervertebral disc degeneration, lumbosacral region: Secondary | ICD-10-CM | POA: Diagnosis not present

## 2013-05-23 DIAGNOSIS — M48061 Spinal stenosis, lumbar region without neurogenic claudication: Secondary | ICD-10-CM | POA: Diagnosis not present

## 2013-05-23 DIAGNOSIS — M25559 Pain in unspecified hip: Secondary | ICD-10-CM | POA: Diagnosis not present

## 2013-05-23 DIAGNOSIS — M47817 Spondylosis without myelopathy or radiculopathy, lumbosacral region: Secondary | ICD-10-CM | POA: Diagnosis not present

## 2013-06-07 DIAGNOSIS — I10 Essential (primary) hypertension: Secondary | ICD-10-CM | POA: Diagnosis not present

## 2013-06-07 DIAGNOSIS — Z6825 Body mass index (BMI) 25.0-25.9, adult: Secondary | ICD-10-CM | POA: Diagnosis not present

## 2013-06-07 DIAGNOSIS — E1159 Type 2 diabetes mellitus with other circulatory complications: Secondary | ICD-10-CM | POA: Diagnosis not present

## 2013-06-07 DIAGNOSIS — R198 Other specified symptoms and signs involving the digestive system and abdomen: Secondary | ICD-10-CM | POA: Diagnosis not present

## 2013-06-07 DIAGNOSIS — I2510 Atherosclerotic heart disease of native coronary artery without angina pectoris: Secondary | ICD-10-CM | POA: Diagnosis not present

## 2013-06-20 ENCOUNTER — Ambulatory Visit (INDEPENDENT_AMBULATORY_CARE_PROVIDER_SITE_OTHER): Payer: Medicare Other | Admitting: General Surgery

## 2013-06-20 ENCOUNTER — Encounter (INDEPENDENT_AMBULATORY_CARE_PROVIDER_SITE_OTHER): Payer: Self-pay | Admitting: General Surgery

## 2013-06-20 VITALS — BP 116/72 | HR 60 | Resp 16 | Ht 74.0 in | Wt 194.2 lb

## 2013-06-20 DIAGNOSIS — L02215 Cutaneous abscess of perineum: Secondary | ICD-10-CM | POA: Insufficient documentation

## 2013-06-20 DIAGNOSIS — L02219 Cutaneous abscess of trunk, unspecified: Secondary | ICD-10-CM | POA: Diagnosis not present

## 2013-06-20 NOTE — Patient Instructions (Signed)
Continue to keep area clean and dry. 

## 2013-06-25 DIAGNOSIS — Z23 Encounter for immunization: Secondary | ICD-10-CM | POA: Diagnosis not present

## 2013-07-08 NOTE — Progress Notes (Signed)
Patient ID: [name], male   DOB: 08-Jun-1935, 78 y.o.   MRN: [id number]  Chief Complaint  Patient presents with  . New Evaluation    eval rectal / scrotal mass     HPI [name] is a 78 y.o. male.  We are asked to see the patient in consultation by Dr. Clelia Croft to evaluate him for a perirectal abscess. The patient is a 78 year old male who first noticed a cyst near his bottom about 2 weeks ago. About one week after he first noticed it it began to drain. The drainage has now stopped. His pain has pretty much gone away. He denies any fevers or chills. His appetite is good and his bowels are working normally.  HPI  Past Medical History  Diagnosis Date  . Cataract   . Diabetes mellitus   . Hyperlipidemia   . Hypertension   . Helicobacter pylori gastritis 01/2006, 03/2011    EGD - Pylera Tx 2010  . CAD (coronary artery disease)   . PAD (peripheral artery disease)     Past Surgical History  Procedure Laterality Date  . Total knee arthroplasty  07-13-2012    right  . Hernia repair  12/24/2010  . Cataract extraction      right  . Colonoscopy  2002, 2007, 06/09/2011    Dr. Marcha Dutton - [name], [place]: for FHx colon cancer, no adenomas; 2012: [facility] - normal  . Esophagogastroduodenoscopy  02/20/06    Dr. Marcha Dutton, [name], [place]:H. pylori gastritis and GERD changes (pathology)  . Upper gastrointestinal endoscopy  03/09/2009    Dr. Leone Payor: H. pylori gastritis (Pylera Tx) and 2 cm hiatal hernia  . Coronary artery bypass graft  05-07-2008    Family History  Problem Relation Age of Onset  . Colon cancer Neg Hx   . Heart disease Father   . Diabetes Father   . Heart attack Father   . Cancer Sister     colon  . Diabetes Sister   . Other Sister     varicose veins  . Heart attack Sister   . Cancer Brother     prostate  . Diabetes Brother   . Heart disease Brother   . Hyperlipidemia Brother   . Hypertension Brother   . Other Brother     varicose veins  . Heart attack Brother   . Heart  disease Daughter   . Hyperlipidemia Daughter   . Hypertension Daughter   . Cancer Sister     lukemia    Social History History  Substance Use Topics  . Smoking status: Current Every Day Smoker -- 0.50 packs/day    Types: Cigarettes  . Smokeless tobacco: Never Used  . Alcohol Use: No    Allergies  Allergen Reactions  . Sulfonamide Derivatives     Current Outpatient Prescriptions  Medication Sig Dispense Refill  . aspirin 81 MG tablet Take 81 mg by mouth daily.        . Coenzyme Q10 (CO Q-10 PO) Take 300 mg by mouth. 3 times a week       . metFORMIN (GLUCOPHAGE) 1000 MG tablet 1 tablet Daily.      . metoprolol succinate (TOPROL-XL) 25 MG 24 hr tablet Take 25 mg by mouth daily.      . ramipril (ALTACE) 5 MG capsule 1 tablet Daily.      . rosuvastatin (CRESTOR) 10 MG tablet Take 10 mg by mouth daily.       No current facility-administered medications for this  visit.    Review of Systems Review of Systems  Constitutional: Negative.   HENT: Negative.   Eyes: Negative.   Respiratory: Negative.   Cardiovascular: Negative.   Gastrointestinal: Positive for rectal pain.  Endocrine: Negative.   Genitourinary: Negative.   Musculoskeletal: Negative.   Skin: Negative.   Allergic/Immunologic: Negative.   Neurological: Negative.   Hematological: Negative.   Psychiatric/Behavioral: Negative.     Blood pressure 116/72, pulse 60, resp. rate 16, height 6\' 2"  (1.88 m), weight 194 lb 3.2 oz (88.089 kg).  Physical Exam Physical Exam  Constitutional: He is oriented to person, place, and time. He appears well-developed and well-nourished.  HENT:  Head: Normocephalic and atraumatic.  Eyes: Conjunctivae and EOM are normal. Pupils are equal, round, and reactive to light.  Neck: Normal range of motion. Neck supple.  Cardiovascular: Normal rate, regular rhythm and normal heart sounds.   Pulmonary/Chest: Effort normal and breath sounds normal.  Abdominal: Soft. Bowel sounds are normal.   Genitourinary:  There is a small open area near the rectum that is no longer draining. There is no cellulitis  Musculoskeletal: Normal range of motion.  Neurological: He is alert and oriented to person, place, and time.  Skin: Skin is warm and dry.  Psychiatric: He has a normal mood and affect. His behavior is normal.    Data Reviewed As above  Assessment    The patient appears to have a perirectal abscess that had spontaneously drained.     Plan    At this point I would continue to keep the area clean and dry. We'll plan to see him back in the next month or 2 to check his progress        TOTH III,Spence Soberano S 07/08/2013, 12:57 PM

## 2013-08-20 ENCOUNTER — Encounter (INDEPENDENT_AMBULATORY_CARE_PROVIDER_SITE_OTHER): Payer: Medicare Other | Admitting: General Surgery

## 2013-08-28 DIAGNOSIS — J069 Acute upper respiratory infection, unspecified: Secondary | ICD-10-CM | POA: Diagnosis not present

## 2013-08-28 DIAGNOSIS — Z6825 Body mass index (BMI) 25.0-25.9, adult: Secondary | ICD-10-CM | POA: Diagnosis not present

## 2013-09-03 DIAGNOSIS — M48061 Spinal stenosis, lumbar region without neurogenic claudication: Secondary | ICD-10-CM | POA: Diagnosis not present

## 2013-09-03 DIAGNOSIS — M47817 Spondylosis without myelopathy or radiculopathy, lumbosacral region: Secondary | ICD-10-CM | POA: Diagnosis not present

## 2013-09-03 DIAGNOSIS — M4310 Spondylolisthesis, site unspecified: Secondary | ICD-10-CM | POA: Diagnosis not present

## 2013-09-03 DIAGNOSIS — M5137 Other intervertebral disc degeneration, lumbosacral region: Secondary | ICD-10-CM | POA: Diagnosis not present

## 2013-09-09 DIAGNOSIS — E1149 Type 2 diabetes mellitus with other diabetic neurological complication: Secondary | ICD-10-CM | POA: Diagnosis not present

## 2013-09-09 DIAGNOSIS — Z125 Encounter for screening for malignant neoplasm of prostate: Secondary | ICD-10-CM | POA: Diagnosis not present

## 2013-09-09 DIAGNOSIS — E785 Hyperlipidemia, unspecified: Secondary | ICD-10-CM | POA: Diagnosis not present

## 2013-09-09 DIAGNOSIS — I10 Essential (primary) hypertension: Secondary | ICD-10-CM | POA: Diagnosis not present

## 2013-09-16 DIAGNOSIS — Z23 Encounter for immunization: Secondary | ICD-10-CM | POA: Diagnosis not present

## 2013-09-16 DIAGNOSIS — E1149 Type 2 diabetes mellitus with other diabetic neurological complication: Secondary | ICD-10-CM | POA: Diagnosis not present

## 2013-09-16 DIAGNOSIS — I2510 Atherosclerotic heart disease of native coronary artery without angina pectoris: Secondary | ICD-10-CM | POA: Diagnosis not present

## 2013-09-16 DIAGNOSIS — Z951 Presence of aortocoronary bypass graft: Secondary | ICD-10-CM | POA: Diagnosis not present

## 2013-09-16 DIAGNOSIS — Z0000 Encounter for general adult medical examination without abnormal findings: Secondary | ICD-10-CM | POA: Diagnosis not present

## 2013-09-16 DIAGNOSIS — I10 Essential (primary) hypertension: Secondary | ICD-10-CM | POA: Diagnosis not present

## 2013-09-16 DIAGNOSIS — E785 Hyperlipidemia, unspecified: Secondary | ICD-10-CM | POA: Diagnosis not present

## 2013-09-16 DIAGNOSIS — F17200 Nicotine dependence, unspecified, uncomplicated: Secondary | ICD-10-CM | POA: Diagnosis not present

## 2013-09-16 DIAGNOSIS — E1159 Type 2 diabetes mellitus with other circulatory complications: Secondary | ICD-10-CM | POA: Diagnosis not present

## 2013-09-18 ENCOUNTER — Ambulatory Visit (HOSPITAL_COMMUNITY)
Admission: RE | Admit: 2013-09-18 | Discharge: 2013-09-18 | Disposition: A | Discharge status: Home / Self Care | Payer: Medicare Other | Source: Ambulatory Visit | Attending: Vascular Surgery | Admitting: Vascular Surgery

## 2013-09-18 ENCOUNTER — Other Ambulatory Visit (HOSPITAL_COMMUNITY): Payer: Self-pay | Admitting: Internal Medicine

## 2013-09-18 DIAGNOSIS — I714 Abdominal aortic aneurysm, without rupture, unspecified: Secondary | ICD-10-CM | POA: Insufficient documentation

## 2013-09-18 DIAGNOSIS — Z1212 Encounter for screening for malignant neoplasm of rectum: Secondary | ICD-10-CM | POA: Diagnosis not present

## 2013-10-09 DIAGNOSIS — M76899 Other specified enthesopathies of unspecified lower limb, excluding foot: Secondary | ICD-10-CM | POA: Diagnosis not present

## 2013-10-09 DIAGNOSIS — M549 Dorsalgia, unspecified: Secondary | ICD-10-CM | POA: Diagnosis not present

## 2013-11-25 DIAGNOSIS — Z961 Presence of intraocular lens: Secondary | ICD-10-CM | POA: Diagnosis not present

## 2013-11-25 DIAGNOSIS — E119 Type 2 diabetes mellitus without complications: Secondary | ICD-10-CM | POA: Diagnosis not present

## 2013-11-25 DIAGNOSIS — H11009 Unspecified pterygium of unspecified eye: Secondary | ICD-10-CM | POA: Diagnosis not present

## 2013-11-25 DIAGNOSIS — H52209 Unspecified astigmatism, unspecified eye: Secondary | ICD-10-CM | POA: Diagnosis not present

## 2013-12-13 DIAGNOSIS — E669 Obesity, unspecified: Secondary | ICD-10-CM | POA: Diagnosis not present

## 2013-12-13 DIAGNOSIS — M47817 Spondylosis without myelopathy or radiculopathy, lumbosacral region: Secondary | ICD-10-CM | POA: Diagnosis not present

## 2013-12-13 DIAGNOSIS — M4310 Spondylolisthesis, site unspecified: Secondary | ICD-10-CM | POA: Diagnosis not present

## 2013-12-13 DIAGNOSIS — M48061 Spinal stenosis, lumbar region without neurogenic claudication: Secondary | ICD-10-CM | POA: Diagnosis not present

## 2014-01-01 ENCOUNTER — Ambulatory Visit: Payer: Medicare Other | Admitting: Cardiology

## 2014-01-02 ENCOUNTER — Ambulatory Visit (INDEPENDENT_AMBULATORY_CARE_PROVIDER_SITE_OTHER): Payer: Medicare Other | Admitting: Cardiology

## 2014-01-02 ENCOUNTER — Encounter: Payer: Self-pay | Admitting: Cardiology

## 2014-01-02 VITALS — BP 140/75 | HR 53 | Ht 74.0 in | Wt 198.0 lb

## 2014-01-02 DIAGNOSIS — I10 Essential (primary) hypertension: Secondary | ICD-10-CM | POA: Diagnosis not present

## 2014-01-02 DIAGNOSIS — E785 Hyperlipidemia, unspecified: Secondary | ICD-10-CM | POA: Diagnosis not present

## 2014-01-02 DIAGNOSIS — I2510 Atherosclerotic heart disease of native coronary artery without angina pectoris: Secondary | ICD-10-CM | POA: Diagnosis not present

## 2014-01-02 DIAGNOSIS — I739 Peripheral vascular disease, unspecified: Secondary | ICD-10-CM | POA: Diagnosis not present

## 2014-01-02 NOTE — Progress Notes (Signed)
[name] Date of Birth: October 05, 1935 Medical Record #[id number]  History of Present Illness: [name] is seen for follow up today. He presented with unstable angina in 2005 and cardiac catheterization demonstrated severe left main and three-vessel coronary disease. He underwent coronary bypass surgery by Dr. Roxan Hockey. Since then he has done very well from a cardiac standpoint.  He goes to the gym 3 days a week and also walks on trails 2 days a week. He is limited by chronic back problems. He also has peripheral arterial disease and had abnormal lower extremity Dopplers in 11/13  showed ankle brachial indices of 0.69. He also has a 3 cm AAA. He continues to smoke 10 cigarettes per week. He is only able to take crestor 2 days/week due to myalgias.  Current Outpatient Prescriptions on File Prior to Visit  Medication Sig Dispense Refill  . aspirin 81 MG tablet Take 81 mg by mouth daily.        . Coenzyme Q10 (CO Q-10 PO) Take 300 mg by mouth. 3 times a week       . metFORMIN (GLUCOPHAGE) 1000 MG tablet 1 tablet Daily.      . metoprolol succinate (TOPROL-XL) 25 MG 24 hr tablet Take 25 mg by mouth daily.      . ramipril (ALTACE) 5 MG capsule 1 tablet Daily.      . rosuvastatin (CRESTOR) 10 MG tablet Take 10 mg by mouth daily.       No current facility-administered medications on file prior to visit.    Allergies  Allergen Reactions  . Sulfonamide Derivatives     Past Medical History  Diagnosis Date  . Cataract   . Diabetes mellitus   . Hyperlipidemia   . Hypertension   . Helicobacter pylori gastritis 01/2006, 03/2011    EGD - Pylera Tx 2010  . CAD (coronary artery disease)   . PAD (peripheral artery disease)     Past Surgical History  Procedure Laterality Date  . Total knee arthroplasty  07-13-2012    right  . Hernia repair  12/24/2010  . Cataract extraction      right  . Colonoscopy  2002, 2007, 06/09/2011    Dr. Evelina Bucy - [name], [place]: for FHx colon cancer, no adenomas;  2012: [facility] - normal  . Esophagogastroduodenoscopy  02/20/06    Dr. Evelina Bucy, [name], [place]. pylori gastritis and GERD changes (pathology)  . Upper gastrointestinal endoscopy  03/09/2009    Dr. Carlean Purl: H. pylori gastritis (Pylera Tx) and 2 cm hiatal hernia  . Coronary artery bypass graft  05-07-2008    History  Smoking status  . Current Every Day Smoker -- 0.50 packs/day  . Types: Cigarettes  Smokeless tobacco  . Never Used    History  Alcohol Use No    Family History  Problem Relation Age of Onset  . Colon cancer Neg Hx   . Heart disease Father   . Diabetes Father   . Heart attack Father   . Cancer Sister     colon  . Diabetes Sister   . Other Sister     varicose veins  . Heart attack Sister   . Cancer Brother     prostate  . Diabetes Brother   . Heart disease Brother   . Hyperlipidemia Brother   . Hypertension Brother   . Other Brother     varicose veins  . Heart attack Brother   . Heart disease Daughter   . Hyperlipidemia Daughter   .  Hypertension Daughter   . Cancer Sister     lukemia    Review of Systems: The review of systems is positive for bilateral claudication in the legs with exertion.  All other systems were reviewed and are negative.  Physical Exam: BP 140/75  Pulse 53  Ht 6\' 2"  (1.88 m)  Wt 198 lb (89.812 kg)  BMI 25.41 kg/m2 He is a pleasant, elderly white male in no acute distress. HEENT: Normocephalic, atraumatic. Pupils are equal round and reactive to light accommodation. Sclera clear. Oropharynx is clear. Dentition is in good repair. Neck is supple without JVD, adenopathy, or thyromegaly. There is a right carotid bruit. Lungs: Clear Cardiovascular: Regular rate and rhythm, normal S1 and S2, no gallop, murmur, or click. PMI is normal. Abdomen: Soft and nontender. Normal bowel sounds. No masses or hepatosplenomegaly. Extremities: Pedal pulses are 1+ and symmetric. He has no edema. Skin: Warm and dry Neuro: Alert and oriented x3  cranial nerves II through XII are intact.  LABORATORY DATA: ECG today demonstrates sinus bradycardia with a rate of 53 beats per minute with first-degree AV block. It is otherwise normal.   Assessment / Plan: 1. Coronary disease status post CABG in 2009. Currently asymptomatic. Exertion is limited by his chronic back pain and claudication. Myoview study in 4/14 was low risk with a small fixed inferobasal defect. EF 64%. Continue aspirin, ACE inhibitor, and metoprolol. I will follow up in 1 year.  2. Diabetes mellitus type 2. On metformin.  3. Hypertension, controlled.  4. Hyperlipidemia. Patient reports taking Crestor 5 mg 2 days a week.  5. Peripheral arterial disease with stable claudication. Recommend smoking cessation and regular walking program.  6. AAA- small. 3.0 cm.

## 2014-01-02 NOTE — Patient Instructions (Signed)
Continue your current therapy  Quit smoking completely  I will see you in one year

## 2014-02-27 DIAGNOSIS — M4310 Spondylolisthesis, site unspecified: Secondary | ICD-10-CM | POA: Diagnosis not present

## 2014-02-27 DIAGNOSIS — M5137 Other intervertebral disc degeneration, lumbosacral region: Secondary | ICD-10-CM | POA: Diagnosis not present

## 2014-02-27 DIAGNOSIS — M47817 Spondylosis without myelopathy or radiculopathy, lumbosacral region: Secondary | ICD-10-CM | POA: Diagnosis not present

## 2014-02-27 DIAGNOSIS — M48061 Spinal stenosis, lumbar region without neurogenic claudication: Secondary | ICD-10-CM | POA: Diagnosis not present

## 2014-03-20 DIAGNOSIS — E1149 Type 2 diabetes mellitus with other diabetic neurological complication: Secondary | ICD-10-CM | POA: Diagnosis not present

## 2014-03-20 DIAGNOSIS — M549 Dorsalgia, unspecified: Secondary | ICD-10-CM | POA: Diagnosis not present

## 2014-03-20 DIAGNOSIS — I10 Essential (primary) hypertension: Secondary | ICD-10-CM | POA: Diagnosis not present

## 2014-03-20 DIAGNOSIS — E1159 Type 2 diabetes mellitus with other circulatory complications: Secondary | ICD-10-CM | POA: Diagnosis not present

## 2014-03-20 DIAGNOSIS — E785 Hyperlipidemia, unspecified: Secondary | ICD-10-CM | POA: Diagnosis not present

## 2014-03-20 DIAGNOSIS — I7389 Other specified peripheral vascular diseases: Secondary | ICD-10-CM | POA: Diagnosis not present

## 2014-03-20 DIAGNOSIS — Z951 Presence of aortocoronary bypass graft: Secondary | ICD-10-CM | POA: Diagnosis not present

## 2014-03-20 DIAGNOSIS — I2510 Atherosclerotic heart disease of native coronary artery without angina pectoris: Secondary | ICD-10-CM | POA: Diagnosis not present

## 2014-03-21 ENCOUNTER — Other Ambulatory Visit: Payer: Self-pay | Admitting: *Deleted

## 2014-03-21 DIAGNOSIS — I70219 Atherosclerosis of native arteries of extremities with intermittent claudication, unspecified extremity: Secondary | ICD-10-CM

## 2014-05-12 DIAGNOSIS — M48061 Spinal stenosis, lumbar region without neurogenic claudication: Secondary | ICD-10-CM | POA: Diagnosis not present

## 2014-05-12 DIAGNOSIS — M47817 Spondylosis without myelopathy or radiculopathy, lumbosacral region: Secondary | ICD-10-CM | POA: Diagnosis not present

## 2014-05-12 DIAGNOSIS — IMO0002 Reserved for concepts with insufficient information to code with codable children: Secondary | ICD-10-CM | POA: Diagnosis not present

## 2014-05-12 DIAGNOSIS — M5137 Other intervertebral disc degeneration, lumbosacral region: Secondary | ICD-10-CM | POA: Diagnosis not present

## 2014-05-21 DIAGNOSIS — M48061 Spinal stenosis, lumbar region without neurogenic claudication: Secondary | ICD-10-CM | POA: Diagnosis not present

## 2014-05-21 DIAGNOSIS — IMO0002 Reserved for concepts with insufficient information to code with codable children: Secondary | ICD-10-CM | POA: Diagnosis not present

## 2014-05-21 DIAGNOSIS — M47817 Spondylosis without myelopathy or radiculopathy, lumbosacral region: Secondary | ICD-10-CM | POA: Diagnosis not present

## 2014-05-22 DIAGNOSIS — L821 Other seborrheic keratosis: Secondary | ICD-10-CM | POA: Diagnosis not present

## 2014-05-22 DIAGNOSIS — L570 Actinic keratosis: Secondary | ICD-10-CM | POA: Diagnosis not present

## 2014-05-22 DIAGNOSIS — Z85828 Personal history of other malignant neoplasm of skin: Secondary | ICD-10-CM | POA: Diagnosis not present

## 2014-05-26 ENCOUNTER — Encounter: Payer: Self-pay | Admitting: Vascular Surgery

## 2014-05-27 ENCOUNTER — Encounter: Payer: Self-pay | Admitting: Vascular Surgery

## 2014-05-27 ENCOUNTER — Ambulatory Visit (HOSPITAL_COMMUNITY)
Admission: RE | Admit: 2014-05-27 | Discharge: 2014-05-27 | Disposition: A | Discharge status: Home / Self Care | Payer: Medicare Other | Source: Ambulatory Visit | Attending: Vascular Surgery | Admitting: Vascular Surgery

## 2014-05-27 ENCOUNTER — Ambulatory Visit (INDEPENDENT_AMBULATORY_CARE_PROVIDER_SITE_OTHER)
Admission: RE | Admit: 2014-05-27 | Discharge: 2014-05-27 | Disposition: A | Discharge status: Home / Self Care | Payer: Medicare Other | Source: Ambulatory Visit | Attending: Vascular Surgery | Admitting: Vascular Surgery

## 2014-05-27 ENCOUNTER — Ambulatory Visit (INDEPENDENT_AMBULATORY_CARE_PROVIDER_SITE_OTHER): Payer: Medicare Other | Admitting: Vascular Surgery

## 2014-05-27 VITALS — BP 150/69 | HR 56 | Ht 74.0 in | Wt 192.1 lb

## 2014-05-27 DIAGNOSIS — I70219 Atherosclerosis of native arteries of extremities with intermittent claudication, unspecified extremity: Secondary | ICD-10-CM | POA: Insufficient documentation

## 2014-05-27 NOTE — Progress Notes (Signed)
Patient name: [name] MRN: [id number] DOB: 11/10/1934 Sex: male   Referred by: Brigitte Pulse  Reason for referral:  Chief Complaint  Patient presents with  . New Evaluation    PVD with claudication    HISTORY OF PRESENT ILLNESS: Patient has today for evaluation of bilateral lower extremity claudication symptoms. I have met him several years ago for evaluation of possible mesenteric ischemia as the cause of his abdominal pain. At that time CT angiogram was reviewed and he is widely patent the mesenteric vessels. He does have main difficulty currently with severe degenerative disc in his back. He has multilevel disease and was reportedly told he was not a candidate for surgery. He does have a different component aside from his back discomfort. He does have a very straightforward bilateral calf claudication. He had in the prior younger days had been very active for walking program. He continues to attempt this but reports that after approximately 1/2 mile he has tightness in his calves and has to stop and rest before resuming walking. He denies any rest pain he denies any prior tissue loss.  Past Medical History  Diagnosis Date  . Cataract   . Diabetes mellitus   . Hyperlipidemia   . Hypertension   . Helicobacter pylori gastritis 01/2006, 03/2011    EGD - Pylera Tx 2010  . CAD (coronary artery disease)   . PAD (peripheral artery disease)     Past Surgical History  Procedure Laterality Date  . Total knee arthroplasty  07-13-2012    right  . Hernia repair  12/24/2010  . Cataract extraction      right  . Colonoscopy  2002, 2007, 06/09/2011    Dr. Evelina Bucy - [name], [place]: for FHx colon cancer, no adenomas; 2012: [facility]ner - normal  . Esophagogastroduodenoscopy  02/20/06    Dr. Evelina Bucy, [name], [place]. pylori gastritis and GERD changes (pathology)  . Upper gastrointestinal endoscopy  03/09/2009    Dr. Carlean Purl: H. pylori gastritis (Pylera Tx) and 2 cm hiatal hernia  . Coronary artery  bypass graft  05-07-2008  . Joint replacement      History   Social History  . Marital Status: Married    Spouse Name: N/A    Number of Children: 2  . Years of Education: N/A   Occupational History  . Retired    Social History Main Topics  . Smoking status: Current Every Day Smoker -- 0.25 packs/day    Types: Cigarettes  . Smokeless tobacco: Never Used  . Alcohol Use: No  . Drug Use: No  . Sexual Activity: Not on file   Other Topics Concern  . Not on file   Social History Narrative  . No narrative on file    Family History  Problem Relation Age of Onset  . Colon cancer Neg Hx   . Heart disease Father   . Diabetes Father   . Heart attack Father   . Hypertension Father   . Varicose Veins Father   . Cancer Sister     colon  . Diabetes Sister   . Other Sister     varicose veins  . Heart attack Sister   . Heart disease Sister   . Hyperlipidemia Sister   . Hypertension Sister   . Varicose Veins Sister   . Cancer Brother     prostate  . Diabetes Brother   . Heart disease Brother   . Hyperlipidemia Brother   . Hypertension Brother   . Other  Brother     varicose veins  . Heart attack Brother   . Varicose Veins Brother   . Heart disease Daughter   . Hyperlipidemia Daughter   . Hypertension Daughter   . Cancer Sister     lukemia    Allergies as of 05/27/2014 - Review Complete 05/27/2014  Allergen Reaction Noted  . Sulfonamide derivatives  03/12/2009    Current Outpatient Prescriptions on File Prior to Visit  Medication Sig Dispense Refill  . aspirin 81 MG tablet Take 81 mg by mouth daily.        . Coenzyme Q10 (CO Q-10 PO) Take 300 mg by mouth. 3 times a week       . metFORMIN (GLUCOPHAGE) 1000 MG tablet 1 tablet Daily.      . metoprolol succinate (TOPROL-XL) 25 MG 24 hr tablet Take 25 mg by mouth daily.      . ramipril (ALTACE) 5 MG capsule 1 tablet Daily.      . rosuvastatin (CRESTOR) 10 MG tablet Take 10 mg by mouth daily.       No current  facility-administered medications on file prior to visit.       PHYSICAL EXAMINATION:  General: The patient is a well-nourished male, in no acute distress. Vital signs are BP 150/69  Pulse 56  Ht $R'6\' 2"'Do$  (1.88 m)  Wt 192 lb 1.6 oz (87.136 kg)  BMI 24.65 kg/m2  SpO2 100% Pulmonary: There is a good air exchange   Musculoskeletal: There are no major deformities.  There is no significant extremity pain. Neurologic: No focal weakness or paresthesias are detected, Skin: There are no ulcer or rashes noted. Psychiatric: The patient has normal affect. Cardiovascular: There is a regular rate and rhythm without significant murmur appreciated. Radial and femoral pulses are 2+ bilaterally. I do not palpate popliteal or distal pulses bilaterally  [facility] Vascular Lab Studies:  Ordered and Independently Reviewed garment index is 0.75 on the right 0.79 on the left. Duplex imaging reveals probable occlusion of his left superficial femoral artery and subtotal or total occlusion of his right superficial femoral artery as well  Impression and Plan:  Bilateral lower extremity claudication symptoms related to superficial artery occlusive disease. I discussed this at great length with the patient. I explained he is not in any risk for limb threatening ischemia currently in the area and likely the progress of this level. I did explain treatment would require initial arteriography and probable bilateral staged femoral to popliteal bypass. With his other pain related to degenerative disease I feel this would not be warranted. I have recommended observation only and continued with his walking program. He has notify should he develop any worsening ischemic symptoms or tissue loss. He is comfortable discussion. He does have a known small abdominal aortic aneurysm is to see [place] in November for repeat ultrasound of his aneurysm. Last measurement was just over 3 cm. We will see him again at that time    Shuntell Foody,  Asti Mackley [name] Office: [phone]

## 2014-06-13 DIAGNOSIS — M48061 Spinal stenosis, lumbar region without neurogenic claudication: Secondary | ICD-10-CM | POA: Diagnosis not present

## 2014-06-13 DIAGNOSIS — M4310 Spondylolisthesis, site unspecified: Secondary | ICD-10-CM | POA: Diagnosis not present

## 2014-06-13 DIAGNOSIS — IMO0002 Reserved for concepts with insufficient information to code with codable children: Secondary | ICD-10-CM | POA: Diagnosis not present

## 2014-07-10 DIAGNOSIS — Z23 Encounter for immunization: Secondary | ICD-10-CM | POA: Diagnosis not present

## 2014-07-17 ENCOUNTER — Encounter: Payer: Self-pay | Admitting: Internal Medicine

## 2014-08-08 DIAGNOSIS — H9193 Unspecified hearing loss, bilateral: Secondary | ICD-10-CM | POA: Diagnosis not present

## 2014-08-08 DIAGNOSIS — G250 Essential tremor: Secondary | ICD-10-CM | POA: Diagnosis not present

## 2014-08-08 DIAGNOSIS — E1149 Type 2 diabetes mellitus with other diabetic neurological complication: Secondary | ICD-10-CM | POA: Diagnosis not present

## 2014-08-08 DIAGNOSIS — H6123 Impacted cerumen, bilateral: Secondary | ICD-10-CM | POA: Diagnosis not present

## 2014-08-08 DIAGNOSIS — Z6825 Body mass index (BMI) 25.0-25.9, adult: Secondary | ICD-10-CM | POA: Diagnosis not present

## 2014-08-13 DIAGNOSIS — M4310 Spondylolisthesis, site unspecified: Secondary | ICD-10-CM | POA: Diagnosis not present

## 2014-08-13 DIAGNOSIS — M4806 Spinal stenosis, lumbar region: Secondary | ICD-10-CM | POA: Diagnosis not present

## 2014-08-13 DIAGNOSIS — M47816 Spondylosis without myelopathy or radiculopathy, lumbar region: Secondary | ICD-10-CM | POA: Diagnosis not present

## 2014-09-17 DIAGNOSIS — Z125 Encounter for screening for malignant neoplasm of prostate: Secondary | ICD-10-CM | POA: Diagnosis not present

## 2014-09-17 DIAGNOSIS — E1151 Type 2 diabetes mellitus with diabetic peripheral angiopathy without gangrene: Secondary | ICD-10-CM | POA: Diagnosis not present

## 2014-09-17 DIAGNOSIS — Z0000 Encounter for general adult medical examination without abnormal findings: Secondary | ICD-10-CM | POA: Diagnosis not present

## 2014-09-17 DIAGNOSIS — E785 Hyperlipidemia, unspecified: Secondary | ICD-10-CM | POA: Diagnosis not present

## 2014-09-23 ENCOUNTER — Other Ambulatory Visit: Payer: Self-pay | Admitting: Internal Medicine

## 2014-09-23 DIAGNOSIS — I739 Peripheral vascular disease, unspecified: Secondary | ICD-10-CM | POA: Diagnosis not present

## 2014-09-23 DIAGNOSIS — E1151 Type 2 diabetes mellitus with diabetic peripheral angiopathy without gangrene: Secondary | ICD-10-CM | POA: Diagnosis not present

## 2014-09-23 DIAGNOSIS — Z0000 Encounter for general adult medical examination without abnormal findings: Secondary | ICD-10-CM | POA: Diagnosis not present

## 2014-09-23 DIAGNOSIS — I2510 Atherosclerotic heart disease of native coronary artery without angina pectoris: Secondary | ICD-10-CM | POA: Diagnosis not present

## 2014-09-23 DIAGNOSIS — E785 Hyperlipidemia, unspecified: Secondary | ICD-10-CM | POA: Diagnosis not present

## 2014-09-23 DIAGNOSIS — Z955 Presence of coronary angioplasty implant and graft: Secondary | ICD-10-CM | POA: Diagnosis not present

## 2014-09-23 DIAGNOSIS — Z139 Encounter for screening, unspecified: Secondary | ICD-10-CM

## 2014-09-23 DIAGNOSIS — I10 Essential (primary) hypertension: Secondary | ICD-10-CM | POA: Diagnosis not present

## 2014-09-23 DIAGNOSIS — E1149 Type 2 diabetes mellitus with other diabetic neurological complication: Secondary | ICD-10-CM | POA: Diagnosis not present

## 2014-09-23 DIAGNOSIS — Z1389 Encounter for screening for other disorder: Secondary | ICD-10-CM | POA: Diagnosis not present

## 2014-09-24 ENCOUNTER — Other Ambulatory Visit: Payer: Self-pay | Admitting: Internal Medicine

## 2014-09-24 DIAGNOSIS — F17219 Nicotine dependence, cigarettes, with unspecified nicotine-induced disorders: Secondary | ICD-10-CM

## 2014-09-26 DIAGNOSIS — Z1212 Encounter for screening for malignant neoplasm of rectum: Secondary | ICD-10-CM | POA: Diagnosis not present

## 2014-10-07 DIAGNOSIS — M4316 Spondylolisthesis, lumbar region: Secondary | ICD-10-CM | POA: Diagnosis not present

## 2014-10-07 DIAGNOSIS — M47816 Spondylosis without myelopathy or radiculopathy, lumbar region: Secondary | ICD-10-CM | POA: Diagnosis not present

## 2014-10-07 DIAGNOSIS — M4806 Spinal stenosis, lumbar region: Secondary | ICD-10-CM | POA: Diagnosis not present

## 2014-10-13 ENCOUNTER — Ambulatory Visit
Admission: RE | Admit: 2014-10-13 | Discharge: 2014-10-13 | Disposition: A | Discharge status: Home / Self Care | Payer: Medicare Other | Source: Ambulatory Visit | Attending: Internal Medicine | Admitting: Internal Medicine

## 2014-10-13 DIAGNOSIS — I2510 Atherosclerotic heart disease of native coronary artery without angina pectoris: Secondary | ICD-10-CM | POA: Diagnosis not present

## 2014-10-13 DIAGNOSIS — F17219 Nicotine dependence, cigarettes, with unspecified nicotine-induced disorders: Secondary | ICD-10-CM

## 2014-10-13 DIAGNOSIS — J9809 Other diseases of bronchus, not elsewhere classified: Secondary | ICD-10-CM | POA: Diagnosis not present

## 2014-10-13 DIAGNOSIS — Z122 Encounter for screening for malignant neoplasm of respiratory organs: Secondary | ICD-10-CM | POA: Diagnosis not present

## 2014-10-13 DIAGNOSIS — J432 Centrilobular emphysema: Secondary | ICD-10-CM | POA: Diagnosis not present

## 2014-10-13 DIAGNOSIS — Z87891 Personal history of nicotine dependence: Secondary | ICD-10-CM | POA: Diagnosis not present

## 2014-11-04 DIAGNOSIS — M47816 Spondylosis without myelopathy or radiculopathy, lumbar region: Secondary | ICD-10-CM | POA: Diagnosis not present

## 2014-11-04 DIAGNOSIS — M5440 Lumbago with sciatica, unspecified side: Secondary | ICD-10-CM | POA: Diagnosis not present

## 2014-11-04 DIAGNOSIS — M4310 Spondylolisthesis, site unspecified: Secondary | ICD-10-CM | POA: Diagnosis not present

## 2014-11-28 DIAGNOSIS — E119 Type 2 diabetes mellitus without complications: Secondary | ICD-10-CM | POA: Diagnosis not present

## 2014-11-28 DIAGNOSIS — Z961 Presence of intraocular lens: Secondary | ICD-10-CM | POA: Diagnosis not present

## 2014-11-28 DIAGNOSIS — H11002 Unspecified pterygium of left eye: Secondary | ICD-10-CM | POA: Diagnosis not present

## 2014-11-28 DIAGNOSIS — H43813 Vitreous degeneration, bilateral: Secondary | ICD-10-CM | POA: Diagnosis not present

## 2014-12-25 DIAGNOSIS — M722 Plantar fascial fibromatosis: Secondary | ICD-10-CM | POA: Diagnosis not present

## 2014-12-25 DIAGNOSIS — M71572 Other bursitis, not elsewhere classified, left ankle and foot: Secondary | ICD-10-CM | POA: Diagnosis not present

## 2014-12-25 DIAGNOSIS — B351 Tinea unguium: Secondary | ICD-10-CM | POA: Diagnosis not present

## 2014-12-25 DIAGNOSIS — M7732 Calcaneal spur, left foot: Secondary | ICD-10-CM | POA: Diagnosis not present

## 2015-01-08 DIAGNOSIS — M5442 Lumbago with sciatica, left side: Secondary | ICD-10-CM | POA: Diagnosis not present

## 2015-01-08 DIAGNOSIS — M5440 Lumbago with sciatica, unspecified side: Secondary | ICD-10-CM | POA: Diagnosis not present

## 2015-01-15 DIAGNOSIS — M545 Low back pain: Secondary | ICD-10-CM | POA: Diagnosis not present

## 2015-01-15 DIAGNOSIS — R202 Paresthesia of skin: Secondary | ICD-10-CM | POA: Diagnosis not present

## 2015-01-15 DIAGNOSIS — I739 Peripheral vascular disease, unspecified: Secondary | ICD-10-CM | POA: Diagnosis not present

## 2015-01-15 DIAGNOSIS — R252 Cramp and spasm: Secondary | ICD-10-CM | POA: Diagnosis not present

## 2015-02-03 DIAGNOSIS — Z6824 Body mass index (BMI) 24.0-24.9, adult: Secondary | ICD-10-CM | POA: Diagnosis not present

## 2015-02-03 DIAGNOSIS — Z955 Presence of coronary angioplasty implant and graft: Secondary | ICD-10-CM | POA: Diagnosis not present

## 2015-02-03 DIAGNOSIS — I739 Peripheral vascular disease, unspecified: Secondary | ICD-10-CM | POA: Diagnosis not present

## 2015-02-03 DIAGNOSIS — F5104 Psychophysiologic insomnia: Secondary | ICD-10-CM | POA: Diagnosis not present

## 2015-02-03 DIAGNOSIS — E785 Hyperlipidemia, unspecified: Secondary | ICD-10-CM | POA: Diagnosis not present

## 2015-02-03 DIAGNOSIS — E1149 Type 2 diabetes mellitus with other diabetic neurological complication: Secondary | ICD-10-CM | POA: Diagnosis not present

## 2015-02-03 DIAGNOSIS — E1151 Type 2 diabetes mellitus with diabetic peripheral angiopathy without gangrene: Secondary | ICD-10-CM | POA: Diagnosis not present

## 2015-02-16 ENCOUNTER — Encounter: Payer: Self-pay | Admitting: Cardiology

## 2015-02-16 ENCOUNTER — Observation Stay (HOSPITAL_COMMUNITY): Payer: Medicare Other

## 2015-02-16 ENCOUNTER — Encounter (HOSPITAL_COMMUNITY): Payer: Self-pay | Admitting: *Deleted

## 2015-02-16 ENCOUNTER — Observation Stay (HOSPITAL_COMMUNITY)
Admission: AD | Admit: 2015-02-16 | Discharge: 2015-02-17 | Disposition: A | Discharge status: Home / Self Care | Payer: Medicare Other | Source: Ambulatory Visit | Attending: Internal Medicine | Admitting: Internal Medicine

## 2015-02-16 ENCOUNTER — Ambulatory Visit (INDEPENDENT_AMBULATORY_CARE_PROVIDER_SITE_OTHER): Payer: Medicare Other | Admitting: Cardiology

## 2015-02-16 VITALS — BP 124/74 | HR 65 | Ht 74.0 in | Wt 181.0 lb

## 2015-02-16 DIAGNOSIS — Z6823 Body mass index (BMI) 23.0-23.9, adult: Secondary | ICD-10-CM | POA: Diagnosis not present

## 2015-02-16 DIAGNOSIS — Z7982 Long term (current) use of aspirin: Secondary | ICD-10-CM | POA: Insufficient documentation

## 2015-02-16 DIAGNOSIS — R066 Hiccough: Secondary | ICD-10-CM | POA: Diagnosis not present

## 2015-02-16 DIAGNOSIS — I2583 Coronary atherosclerosis due to lipid rich plaque: Principal | ICD-10-CM

## 2015-02-16 DIAGNOSIS — I739 Peripheral vascular disease, unspecified: Secondary | ICD-10-CM | POA: Diagnosis not present

## 2015-02-16 DIAGNOSIS — E871 Hypo-osmolality and hyponatremia: Secondary | ICD-10-CM | POA: Diagnosis not present

## 2015-02-16 DIAGNOSIS — E785 Hyperlipidemia, unspecified: Secondary | ICD-10-CM

## 2015-02-16 DIAGNOSIS — E860 Dehydration: Secondary | ICD-10-CM

## 2015-02-16 DIAGNOSIS — I70219 Atherosclerosis of native arteries of extremities with intermittent claudication, unspecified extremity: Secondary | ICD-10-CM | POA: Diagnosis not present

## 2015-02-16 DIAGNOSIS — I714 Abdominal aortic aneurysm, without rupture: Secondary | ICD-10-CM | POA: Diagnosis not present

## 2015-02-16 DIAGNOSIS — E119 Type 2 diabetes mellitus without complications: Secondary | ICD-10-CM | POA: Diagnosis not present

## 2015-02-16 DIAGNOSIS — R634 Abnormal weight loss: Secondary | ICD-10-CM | POA: Diagnosis not present

## 2015-02-16 DIAGNOSIS — I2510 Atherosclerotic heart disease of native coronary artery without angina pectoris: Secondary | ICD-10-CM | POA: Insufficient documentation

## 2015-02-16 DIAGNOSIS — Z720 Tobacco use: Secondary | ICD-10-CM | POA: Diagnosis not present

## 2015-02-16 DIAGNOSIS — I10 Essential (primary) hypertension: Secondary | ICD-10-CM | POA: Diagnosis not present

## 2015-02-16 DIAGNOSIS — K4090 Unilateral inguinal hernia, without obstruction or gangrene, not specified as recurrent: Secondary | ICD-10-CM | POA: Diagnosis not present

## 2015-02-16 DIAGNOSIS — E1151 Type 2 diabetes mellitus with diabetic peripheral angiopathy without gangrene: Secondary | ICD-10-CM | POA: Diagnosis not present

## 2015-02-16 DIAGNOSIS — Z79899 Other long term (current) drug therapy: Secondary | ICD-10-CM | POA: Diagnosis not present

## 2015-02-16 DIAGNOSIS — Z951 Presence of aortocoronary bypass graft: Secondary | ICD-10-CM | POA: Insufficient documentation

## 2015-02-16 DIAGNOSIS — J984 Other disorders of lung: Secondary | ICD-10-CM | POA: Diagnosis not present

## 2015-02-16 LAB — COMPREHENSIVE METABOLIC PANEL
ALT: 16 U/L (ref 0–53)
AST: 21 U/L (ref 0–37)
Albumin: 4.2 g/dL (ref 3.5–5.2)
Alkaline Phosphatase: 67 U/L (ref 39–117)
Anion gap: 9 (ref 5–15)
BUN: 12 mg/dL (ref 6–23)
CALCIUM: 9.1 mg/dL (ref 8.4–10.5)
CO2: 27 mmol/L (ref 19–32)
Chloride: 86 mmol/L — ABNORMAL LOW (ref 96–112)
Creatinine, Ser: 0.69 mg/dL (ref 0.50–1.35)
GFR calc non Af Amer: 89 mL/min — ABNORMAL LOW (ref >90)
GLUCOSE: 147 mg/dL — AB (ref 70–99)
Potassium: 3.9 mmol/L (ref 3.5–5.1)
SODIUM: 122 mmol/L — AB (ref 135–145)
TOTAL PROTEIN: 7.3 g/dL (ref 6.0–8.3)
Total Bilirubin: 0.7 mg/dL (ref 0.3–1.2)

## 2015-02-16 LAB — URINALYSIS, ROUTINE W REFLEX MICROSCOPIC
Bilirubin Urine: NEGATIVE
GLUCOSE, UA: NEGATIVE mg/dL
Hgb urine dipstick: NEGATIVE
KETONES UR: NEGATIVE mg/dL
Leukocytes, UA: NEGATIVE
Nitrite: NEGATIVE
PROTEIN: NEGATIVE mg/dL
Specific Gravity, Urine: 1.017 (ref 1.005–1.030)
Urobilinogen, UA: 1 mg/dL (ref 0.0–1.0)
pH: 6 (ref 5.0–8.0)

## 2015-02-16 LAB — CBC
HCT: 44.1 % (ref 39.0–52.0)
Hemoglobin: 15.2 g/dL (ref 13.0–17.0)
MCH: 30 pg (ref 26.0–34.0)
MCHC: 34.5 g/dL (ref 30.0–36.0)
MCV: 87 fL (ref 78.0–100.0)
Platelets: 166 10*3/uL (ref 150–400)
RBC: 5.07 MIL/uL (ref 4.22–5.81)
RDW: 13 % (ref 11.5–15.5)
WBC: 10.4 10*3/uL (ref 4.0–10.5)

## 2015-02-16 LAB — GLUCOSE, CAPILLARY: GLUCOSE-CAPILLARY: 164 mg/dL — AB (ref 70–99)

## 2015-02-16 LAB — TSH: TSH: 1.156 u[IU]/mL (ref 0.350–4.500)

## 2015-02-16 MED ORDER — INSULIN ASPART 100 UNIT/ML ~~LOC~~ SOLN
0.0000 [IU] | SUBCUTANEOUS | Status: DC
  Administered 2015-02-16: 3 [IU] via SUBCUTANEOUS
  Administered 2015-02-17: 2 [IU] via SUBCUTANEOUS

## 2015-02-16 MED ORDER — SODIUM CHLORIDE 0.9 % IV SOLN
12.5000 mg | Freq: Three times a day (TID) | INTRAVENOUS | Status: DC
  Administered 2015-02-16 – 2015-02-17 (×3): 12.5 mg via INTRAVENOUS
  Filled 2015-02-16 (×4): qty 0.5

## 2015-02-16 MED ORDER — ZOLPIDEM TARTRATE 5 MG PO TABS
5.0000 mg | ORAL_TABLET | Freq: Every evening | ORAL | Status: DC | PRN
  Administered 2015-02-16: 5 mg via ORAL
  Filled 2015-02-16: qty 1

## 2015-02-16 MED ORDER — SODIUM CHLORIDE 0.9 % IV BOLUS (SEPSIS)
500.0000 mL | Freq: Once | INTRAVENOUS | Status: AC
  Administered 2015-02-16: 500 mL via INTRAVENOUS

## 2015-02-16 MED ORDER — ASPIRIN 81 MG PO CHEW
81.0000 mg | CHEWABLE_TABLET | Freq: Every day | ORAL | Status: DC
  Administered 2015-02-16 – 2015-02-17 (×2): 81 mg via ORAL
  Filled 2015-02-16 (×2): qty 1

## 2015-02-16 MED ORDER — HEPARIN SODIUM (PORCINE) 5000 UNIT/ML IJ SOLN
5000.0000 [IU] | Freq: Three times a day (TID) | INTRAMUSCULAR | Status: DC
  Administered 2015-02-16 – 2015-02-17 (×2): 5000 [IU] via SUBCUTANEOUS
  Filled 2015-02-16 (×5): qty 1

## 2015-02-16 MED ORDER — PANTOPRAZOLE SODIUM 40 MG PO TBEC: 40.0000 mg | DELAYED_RELEASE_TABLET | Freq: Every day | ORAL | Status: DC

## 2015-02-16 MED ORDER — ONDANSETRON HCL 4 MG PO TABS: 4.0000 mg | ORAL_TABLET | Freq: Three times a day (TID) | ORAL | Status: DC | PRN

## 2015-02-16 MED ORDER — ACETAMINOPHEN 325 MG PO TABS: 650.0000 mg | ORAL_TABLET | Freq: Four times a day (QID) | ORAL | Status: DC | PRN

## 2015-02-16 MED ORDER — ONDANSETRON HCL 4 MG/2ML IJ SOLN: 4.0000 mg | Freq: Four times a day (QID) | INTRAMUSCULAR | Status: DC | PRN

## 2015-02-16 MED ORDER — PANTOPRAZOLE SODIUM 40 MG PO TBEC
40.0000 mg | DELAYED_RELEASE_TABLET | Freq: Every day | ORAL | Status: DC
  Administered 2015-02-17: 40 mg via ORAL
  Filled 2015-02-16: qty 1

## 2015-02-16 MED ORDER — ALUM & MAG HYDROXIDE-SIMETH 200-200-20 MG/5ML PO SUSP
30.0000 mL | Freq: Four times a day (QID) | ORAL | Status: DC | PRN
Start: 2015-02-16 — End: 2015-02-17

## 2015-02-16 MED ORDER — ONDANSETRON HCL 4 MG PO TABS: 4.0000 mg | ORAL_TABLET | Freq: Four times a day (QID) | ORAL | Status: DC | PRN

## 2015-02-16 MED ORDER — ACETAMINOPHEN 650 MG RE SUPP: 650.0000 mg | Freq: Four times a day (QID) | RECTAL | Status: DC | PRN

## 2015-02-16 MED ORDER — OXYCODONE HCL 5 MG PO TABS: 5.0000 mg | ORAL_TABLET | ORAL | Status: DC | PRN

## 2015-02-16 MED ORDER — SODIUM CHLORIDE 0.9 % IV SOLN
INTRAVENOUS | Status: DC
  Administered 2015-02-16 – 2015-02-17 (×2): via INTRAVENOUS

## 2015-02-16 MED ORDER — METOPROLOL TARTRATE 1 MG/ML IV SOLN: 5.0000 mg | Freq: Three times a day (TID) | INTRAVENOUS | Status: DC

## 2015-02-16 MED ORDER — METOPROLOL SUCCINATE ER 25 MG PO TB24: 25.0000 mg | ORAL_TABLET | Freq: Every day | ORAL | Status: DC

## 2015-02-16 NOTE — Progress Notes (Signed)
[name] Date of Birth: [birth year] Medical Record #[id number]  History of Present Illness: [name] is seen for follow up CAD. He presented with unstable angina in 2009 and cardiac catheterization demonstrated severe left main and three-vessel coronary disease. He underwent coronary bypass surgery by Dr. Roxan Hockey. Since then he has done very well from a cardiac standpoint.  He has been active.  He is intolerant of statins due to myalgias. On follow up today he complains of refractory hiccups. This is associated with nausea and vomiting. Has not been able to keep solid food down. States he has intermittent hiccups that have been worse ever since CABG. Feels a lot of heartburn. Has tried Thorazine in past without relief.   Current Outpatient Prescriptions on File Prior to Visit  Medication Sig Dispense Refill  . aspirin 81 MG tablet Take 81 mg by mouth daily.      . Coenzyme Q10 (CO Q-10 PO) Take 300 mg by mouth. 3 times a week     . metFORMIN (GLUCOPHAGE) 1000 MG tablet 1 tablet Daily.    . metoprolol succinate (TOPROL-XL) 25 MG 24 hr tablet Take 25 mg by mouth daily.    . ramipril (ALTACE) 5 MG capsule 1 tablet Daily.     No current facility-administered medications on file prior to visit.    Allergies  Allergen Reactions  . Sulfonamide Derivatives     Past Medical History  Diagnosis Date  . Cataract   . Diabetes mellitus   . Hyperlipidemia   . Hypertension   . Helicobacter pylori gastritis 01/2006, 03/2011    EGD - Pylera Tx 2010  . CAD (coronary artery disease)   . PAD (peripheral artery disease)   . Hiccups     Past Surgical History  Procedure Laterality Date  . Total knee arthroplasty  07-13-2012    right  . Hernia repair  12/24/2010  . Cataract extraction      right  . Colonoscopy  2002, 2007, 06/09/2011    Dr. Evelina Clark - [name], [name]: for FHx colon cancer, no adenomas; 2012: [name] - normal  . Esophagogastroduodenoscopy  02/20/06    Dr. Evelina Clark, [name],  [name]. pylori gastritis and GERD changes (pathology)  . Upper gastrointestinal endoscopy  03/09/2009    Dr. Carlean Clark: H. pylori gastritis (Pylera Tx) and 2 cm hiatal hernia  . Coronary artery bypass graft  05-07-2008  . Joint replacement      History  Smoking status  . Current Every Day Smoker -- 0.25 packs/day  . Types: Cigarettes  Smokeless tobacco  . Never Used    History  Alcohol Use No    Family History  Problem Relation Age of Onset  . Colon cancer Neg Hx   . Heart disease Father   . Diabetes Father   . Heart attack Father   . Hypertension Father   . Varicose Veins Father   . Cancer Sister     colon  . Diabetes Sister   . Other Sister     varicose veins  . Heart attack Sister   . Heart disease Sister   . Hyperlipidemia Sister   . Hypertension Sister   . Varicose Veins Sister   . Cancer Brother     prostate  . Diabetes Brother   . Heart disease Brother   . Hyperlipidemia Brother   . Hypertension Brother   . Other Brother     varicose veins  . Heart attack Brother   . Varicose Veins Brother   .  Heart disease Daughter   . Hyperlipidemia Daughter   . Hypertension Daughter   . Cancer Sister     lukemia    Review of Systems: The review of systems is positive chronic back problems and imbalance.  All other systems were reviewed and are negative.  Physical Exam: BP 124/74 mmHg  Pulse 65  Ht 6\' 2"  (1.88 m)  Wt 181 lb (82.101 kg)  BMI 23.23 kg/m2 He is a pleasant, elderly white male. Difficult for him to speak due to hiccups.  HEENT: Normocephalic, atraumatic. Pupils are equal round and reactive to light accommodation. Sclera clear. Oropharynx is clear. Neck is supple without JVD, adenopathy, or thyromegaly. There is a right carotid bruit. Lungs: Clear Cardiovascular: Regular rate and rhythm, normal S1 and S2, no gallop, murmur, or click. PMI is normal. Abdomen: Soft and nontender. Normal bowel sounds. No masses or hepatosplenomegaly. Extremities: Pedal  pulses are 1+ and symmetric. He has no edema. Skin: Warm and dry Neuro: Alert and oriented x3 cranial nerves II through XII are intact.  LABORATORY DATA: ECG today demonstrates sinus rhythm with a rate of 65 beats per minute with first-degree AV block. It is otherwise normal.   Assessment / Plan: 1. Coronary disease status post CABG in 2009. Currently asymptomatic. Exertion is limited by his chronic back pain and claudication. Myoview study in 4/14 was low risk with a small fixed inferobasal defect. EF 64%. Continue aspirin, ACE inhibitor, and metoprolol.   2. Diabetes mellitus type 2. On metformin.  3. Hypertension, controlled.  4. Hyperlipidemia. Intolerant of statins and Zetia. Unable to afford PCSK 9 inhibitor.  5. Peripheral arterial disease with stable claudication. Recommend smoking cessation and regular walking program.  6. AAA- small. 3.0 cm.  7. Refractory hiccups with GERD. Called in Rx for Protonix and Zofran. He plans to see primary care today.

## 2015-02-16 NOTE — H&P (Addendum)
[facility] History and Physical  [name] UUV:[id number] DOB: 04/29/1935 DOA: 02/16/2015  Referring physician:  PCP: [name], MD   Chief Complaint: Headache, Nausea, Vomiting  HPI: [name] is a 80 y.o. male with a past medical history of established coronary artery disease status post coronary artery bypass grafting,  type 2 diabetes mellitus, hypertension, presenting as a direct admission from his primary care physician's office. Patient reporting having a history of 3 severe spells of intractable headache up since undergoing coronary artery bypass grafting in 2009. He reports these episodes last between 7-12 days. He presented to his PCP's office with complaints of intractable hiccups for the past 6 days occurring day and night associated with nausea, vomiting, poor by mouth intake and inability to keep by mouth down. He reports symptoms have been so severe he has not been able to take his medications. He also reports having 2 falls this week. He denies fevers, chills, chest pain, shortness of breath, abdominal pain, dysuria, hematuria. He saw his cardiologist Dr. Martinique earlier today and found to be stable from a cardiac standpoint.                                                                    Review of Systems:  Constitutional:  No weight loss, night sweats, Fevers, chills, fatigue.  HEENT:  No headaches, Difficulty swallowing,Tooth/dental problems,Sore throat,  No sneezing, itching, ear ache, nasal congestion, post nasal drip,  Cardio-vascular:  No chest pain, Orthopnea, PND, swelling in lower extremities, anasarca, dizziness, palpitations  GI:  No heartburn, indigestion, abdominal pain, nausea, vomiting, diarrhea, change in bowel habits, loss of appetite  Resp:  No shortness of breath with exertion or at rest. No excess mucus, no productive cough, No non-productive cough, No coughing up of blood.No change in color of mucus.No wheezing.No chest wall deformity    Skin:  no rash or lesions.  GU:  no dysuria, change in color of urine, no urgency or frequency. No flank pain.  Musculoskeletal:  No joint pain or swelling. No decreased range of motion. No back pain.  Psych:  No change in mood or affect. No depression or anxiety. No memory loss.   Past Medical History  Diagnosis Date  . Cataract   . Diabetes mellitus   . Hyperlipidemia   . Hypertension   . Helicobacter pylori gastritis 01/2006, 03/2011    EGD - Pylera Tx 2010  . CAD (coronary artery disease)   . PAD (peripheral artery disease)   . Hiccups    Past Surgical History  Procedure Laterality Date  . Total knee arthroplasty  07-13-2012    right  . Hernia repair  12/24/2010  . Cataract extraction      right  . Colonoscopy  2002, 2007, 06/09/2011    Dr. Evelina Bucy - [name], [place]: for FHx colon cancer, no adenomas; 2012: Gessner - normal  . Esophagogastroduodenoscopy  02/20/06    Dr. Evelina Bucy, [name], [place]. pylori gastritis and GERD changes (pathology)  . Upper gastrointestinal endoscopy  03/09/2009    Dr. Carlean Purl: H. pylori gastritis (Pylera Tx) and 2 cm hiatal hernia  . Coronary artery bypass graft  05-07-2008  . Joint replacement      right knee replacement   Social History:  reports that he has been smoking Cigarettes.  He has a 10 pack-year smoking history. He has never used smokeless tobacco. He reports that he does not drink alcohol or use illicit drugs.  Allergies  Allergen Reactions  . Sulfonamide Derivatives     Family History  Problem Relation Age of Onset  . Colon cancer Neg Hx   . Heart disease Father   . Diabetes Father   . Heart attack Father   . Hypertension Father   . Varicose Veins Father   . Cancer Sister     colon  . Diabetes Sister   . Other Sister     varicose veins  . Heart attack Sister   . Heart disease Sister   . Hyperlipidemia Sister   . Hypertension Sister   . Varicose Veins Sister   . Cancer Brother     prostate  . Diabetes Brother   .  Heart disease Brother   . Hyperlipidemia Brother   . Hypertension Brother   . Other Brother     varicose veins  . Heart attack Brother   . Varicose Veins Brother   . Heart disease Daughter   . Hyperlipidemia Daughter   . Hypertension Daughter   . Cancer Sister     lukemia    Prior to Admission medications   Medication Sig Start Date End Date Taking? Authorizing Provider  aspirin 81 MG tablet Take 81 mg by mouth daily.      Historical Provider, MD  Coenzyme Q10 (CO Q-10 PO) Take 300 mg by mouth. 3 times a week     Historical Provider, MD  metFORMIN (GLUCOPHAGE) 1000 MG tablet 1 tablet Daily. 02/16/11   Historical Provider, MD  metoprolol succinate (TOPROL-XL) 25 MG 24 hr tablet Take 25 mg by mouth daily.    Historical Provider, MD  ondansetron (ZOFRAN) 4 MG tablet Take 1 tablet (4 mg total) by mouth every 8 (eight) hours as needed for nausea or vomiting. 02/16/15   Peter M Martinique, MD  pantoprazole (PROTONIX) 40 MG tablet Take 1 tablet (40 mg total) by mouth daily. 02/16/15   Peter M Martinique, MD  ramipril (ALTACE) 5 MG capsule 1 tablet Daily. 05/10/11   Historical Provider, MD   Physical Exam: Filed Vitals:   02/16/15 1706  BP: 116/93  Pulse: 73  Temp: 98.2 F (36.8 C)  TempSrc: Oral  Resp: 18  Height: 6\' 2"  (1.88 m)  Weight: 82.101 kg (181 lb)  SpO2: 97%    Wt Readings from Last 3 Encounters:  02/16/15 82.101 kg (181 lb)  02/16/15 82.101 kg (181 lb)  05/27/14 87.136 kg (192 lb 1.6 oz)    General:  Patient actually having hiccups that have been nonstop during my assessment. Otherwise he is nontoxic appearing, awake and alert, cooperative and pleasant Eyes: PERRL, normal lids, irises & conjunctiva ENT: grossly normal hearing, lips & tongue, dry oral mucosa Neck: no LAD, masses or thyromegaly Cardiovascular: RRR, no m/r/g. No LE edema. Telemetry: SR, no arrhythmias  Respiratory: CTA bilaterally, no w/r/r. Normal respiratory effort. Abdomen: soft, ntnd Skin: no rash or  induration seen on limited exam Musculoskeletal: grossly normal tone BUE/BLE Psychiatric: grossly normal mood and affect, speech fluent and appropriate Neurologic: grossly non-focal.          Labs on Admission:  Basic Metabolic Panel: No results for input(s): NA, K, CL, CO2, GLUCOSE, BUN, CREATININE, CALCIUM, MG, PHOS in the last 168 hours. Liver Function Tests: No results for input(s): AST, ALT, ALKPHOS,  BILITOT, PROT, ALBUMIN in the last 168 hours. No results for input(s): LIPASE, AMYLASE in the last 168 hours. No results for input(s): AMMONIA in the last 168 hours. CBC: No results for input(s): WBC, NEUTROABS, HGB, HCT, MCV, PLT in the last 168 hours. Cardiac Enzymes: No results for input(s): CKTOTAL, CKMB, CKMBINDEX, TROPONINI in the last 168 hours.  BNP (last 3 results) No results for input(s): BNP in the last 8760 hours.  ProBNP (last 3 results) No results for input(s): PROBNP in the last 8760 hours.  CBG: No results for input(s): GLUCAP in the last 168 hours.  Radiological Exams on Admission: No results found.  EKG: Independently reviewed.   Assessment/Plan Principal Problem:   Dehydration Active Problems:   Hypertension   Hyperlipidemia   Atherosclerosis of native arteries of extremity with intermittent claudication   Intractable hiccups   Hyponatremia   1. Intractable hiccups. Patient reports having 3 previous episodes of intractable hiccups since undergoing coronary artery bypass grafting in 2009 activity lasting 7-12 days. Symptoms have been significant enough to affect his ability to take by mouth including medications. Will keep him nothing by mouth for now, provide IV fluid resuscitation as he appears dry on exam, and schedule Thorazine 12.5 mg IV 3 times a day. His PCP shared concerns over 15 pound weight loss for which he'll be further worked up with CT scan of chest abdomen and pelvis. 2. Dehydration. Patient appearing dry and physical exam having minimal  by mouth intake as well as nausea vomiting secondary to intractable hiccups. Will bolus him with 500 ML's of normal saline followed by maintenance normal saline at 100 mL/hour overnight. Repeat labs in a.m. 3. Hyponatremia. His PCP reported patient having a sodium of 125 on lab work done at his office. Likely secondary to hypotonic hypovolemic hyponatremia given nausea vomiting and minimal by mouth intake associated with refractory hiccups. As mentioned above will receive IV fluid resuscitation overnight, repeat labs in a.m. 4. Type 2 diabetes mellitus. Since patient is nothing by mouth will hold oral hypoglycemic agents, perform Accu-Cheks every 4 hours and cover with sliding scale insulin. 5. Hypertension. Patient having soft blood pressures on presentation likely secondary to his hypovolemic state. Will hold off on antihypertensive agents.  6. Coronary artery disease. Patient with an established history of coronary artery disease who saw his cardiologist earlier today at the clinic, appears to be stable from a cardiac standpoint. He denies chest pain or shortness of breath. 7. Unintentional weight loss. Patient reporting a 15 weight loss. Will follow-up with CT scan of chest abdomen and pelvis, as perhaps this study may shed some light into the etiology of refractory hiccups.  8. Gastroesophageal reflux disease. PPI 9. DVT prophylaxis. Subcutaneous heparin   Code Status: Full code Family Communication: Spoke with his wife and son present at bedside Disposition Plan: Do not anticipate patient requiring greater than 2 nights hospitalization will late in overnight observation  Time spent: 13 min  Kelvin Cellar [facility] Pager [phone]

## 2015-02-16 NOTE — Patient Instructions (Signed)
We will start you on Protonix 40 mg daily  We will call in a Rx for Zofran.  Continue your cardiac meds

## 2015-02-17 DIAGNOSIS — E860 Dehydration: Secondary | ICD-10-CM | POA: Diagnosis not present

## 2015-02-17 DIAGNOSIS — R634 Abnormal weight loss: Secondary | ICD-10-CM | POA: Diagnosis not present

## 2015-02-17 DIAGNOSIS — I714 Abdominal aortic aneurysm, without rupture: Secondary | ICD-10-CM | POA: Diagnosis not present

## 2015-02-17 DIAGNOSIS — I10 Essential (primary) hypertension: Secondary | ICD-10-CM | POA: Diagnosis not present

## 2015-02-17 DIAGNOSIS — R066 Hiccough: Secondary | ICD-10-CM | POA: Diagnosis not present

## 2015-02-17 DIAGNOSIS — E119 Type 2 diabetes mellitus without complications: Secondary | ICD-10-CM | POA: Diagnosis not present

## 2015-02-17 DIAGNOSIS — E871 Hypo-osmolality and hyponatremia: Secondary | ICD-10-CM | POA: Diagnosis not present

## 2015-02-17 DIAGNOSIS — I2510 Atherosclerotic heart disease of native coronary artery without angina pectoris: Secondary | ICD-10-CM | POA: Diagnosis not present

## 2015-02-17 LAB — CBC
HCT: 39.2 % (ref 39.0–52.0)
Hemoglobin: 13.5 g/dL (ref 13.0–17.0)
MCH: 29.9 pg (ref 26.0–34.0)
MCHC: 34.4 g/dL (ref 30.0–36.0)
MCV: 86.7 fL (ref 78.0–100.0)
PLATELETS: 147 10*3/uL — AB (ref 150–400)
RBC: 4.52 MIL/uL (ref 4.22–5.81)
RDW: 12.9 % (ref 11.5–15.5)
WBC: 7.6 10*3/uL (ref 4.0–10.5)

## 2015-02-17 LAB — GLUCOSE, CAPILLARY
GLUCOSE-CAPILLARY: 110 mg/dL — AB (ref 70–99)
GLUCOSE-CAPILLARY: 122 mg/dL — AB (ref 70–99)
Glucose-Capillary: 108 mg/dL — ABNORMAL HIGH (ref 70–99)

## 2015-02-17 LAB — BASIC METABOLIC PANEL
ANION GAP: 6 (ref 5–15)
BUN: 9 mg/dL (ref 6–23)
CHLORIDE: 96 mmol/L (ref 96–112)
CO2: 27 mmol/L (ref 19–32)
CREATININE: 0.72 mg/dL (ref 0.50–1.35)
Calcium: 8.4 mg/dL (ref 8.4–10.5)
GFR calc Af Amer: >90 mL/min (ref >90)
GFR calc non Af Amer: 87 mL/min — ABNORMAL LOW (ref >90)
Glucose, Bld: 117 mg/dL — ABNORMAL HIGH (ref 70–99)
POTASSIUM: 3.6 mmol/L (ref 3.5–5.1)
Sodium: 129 mmol/L — ABNORMAL LOW (ref 135–145)

## 2015-02-17 MED ORDER — CHLORPROMAZINE HCL 25 MG PO TABS: 10.0000 mg | ORAL_TABLET | Freq: Three times a day (TID) | ORAL | Status: DC | PRN

## 2015-02-17 NOTE — Discharge Summary (Signed)
Physician Discharge Summary  [name] CHE:[id number] DOB: 24-Jun-1935 DOA: 02/16/2015  PCP: [name], MD  Admit date: 02/16/2015 Discharge date: 02/17/2015  Time spent: 35 minutes  Recommendations for Outpatient Follow-up:  1. Please follow up on BMP in 1 week to follow up on sodium levels. His Sodium improved from 122 to 129 after IV fluid administration overnight.  2. Follow up on his blood pressures, he is on 2 antihypertensive agents, having blood pressure of 122/47 on day of discharge. He was instructed to hold ramipril and report blood pressures to his primary care physician.    Discharge Diagnoses:  Principal Problem:   Dehydration Active Problems:   Hypertension   Hyperlipidemia   Atherosclerosis of native arteries of extremity with intermittent claudication   Intractable hiccups   Hyponatremia   Discharge Condition: Stable/Improved  Diet recommendation: Regular diet  Filed Weights   02/16/15 1706  Weight: 82.101 kg (181 lb)    History of present illness:  [name] is a 80 y.o. male with a past medical history of established coronary artery disease status post coronary artery bypass grafting, type 2 diabetes mellitus, hypertension, presenting as a direct admission from his primary care physician's office. Patient reporting having a history of 3 severe spells of intractable headache up since undergoing coronary artery bypass grafting in 2009. He reports these episodes last between 7-12 days. He presented to his PCP's office with complaints of intractable hiccups for the past 6 days occurring day and night associated with nausea, vomiting, poor by mouth intake and inability to keep by mouth down. He reports symptoms have been so severe he has not been able to take his medications. He also reports having 2 falls this week. He denies fevers, chills, chest pain, shortness of breath, abdominal pain, dysuria, hematuria. He saw his cardiologist Dr. Martinique earlier today and  found to be stable from a cardiac standpoint.  Hospital Course:  Patient is a pleasant 80 year old gentleman with a history of coronary artery disease admitted to the medicine service on 02/16/2015 when he presented with intractable hiccups. Symptoms were severe enough to affect his ability to adequately hydrate himself. Furthermore he reported GI losses with having multiple episodes of nausea and vomiting. On exam he appeared dehydrated, with BMP showing a sodium of 122. Hyponatremia likely secondary to hypotonic hypovolemic hyponatremia. He was started on scheduled Thorazine 12.5 mg IV 3 times a day and given IV fluid resuscitation overnight with normal saline. On the following day he reported significant improvement with resolution to his hiccups. Repeat lab work showed a upward trend in his sodium 129. He was tolerating by mouth intake and expresses desire to be discharged today. Patient on ramipril and metoprolol, having blood pressure of 127/47. Please follow-up on his blood pressures as he was instructed only take metoprolol for now as I was concerned for precipitating hypotension. Other issues addressed during this hospitalization include patient's report of 15 pound weight loss. This was further worked up with a CT scan of chest abdomen and pelvis that did not show evidence for malignancy. Radiology did report an abdominal aortic aneurysm having maximum diameter of 4 cm and recommended follow-up ultrasound in one year. He was discharged in stable condition on 02/17/2015 to his home.    Discharge Exam: Filed Vitals:   02/17/15 0500  BP: 127/47  Pulse: 61  Temp: 98.4 F (36.9 C)  Resp: 18    General: Patient is awake, alert, no acute distress, states feeling much better and  is requesting to be discharged Cardiovascular: Regular rate and rhythm normal S1-S2 no murmurs rubs or gallops Respiratory: Normal respiratory effort, lungs are clear to auscultation bilaterally Abdomen: Soft  nontender nondistended positive bowel sounds  Discharge Instructions   Discharge Instructions    Call MD for:  difficulty breathing, headache or visual disturbances    Complete by:  As directed      Call MD for:  extreme fatigue    Complete by:  As directed      Call MD for:  hives    Complete by:  As directed      Call MD for:  persistant dizziness or light-headedness    Complete by:  As directed      Call MD for:  persistant nausea and vomiting    Complete by:  As directed      Call MD for:  redness, tenderness, or signs of infection (pain, swelling, redness, odor or green/yellow discharge around incision site)    Complete by:  As directed      Call MD for:  severe uncontrolled pain    Complete by:  As directed      Call MD for:  temperature >100.4    Complete by:  As directed      Diet - low sodium heart healthy    Complete by:  As directed      Increase activity slowly    Complete by:  As directed           Current Discharge Medication List    START taking these medications   Details  chlorproMAZINE (THORAZINE) 25 MG tablet Take 0.5 tablets (12.5 mg total) by mouth 3 (three) times daily as needed for hiccoughs. Qty: 20 tablet, Refills: 1      CONTINUE these medications which have NOT CHANGED   Details  aspirin 81 MG tablet Take 81 mg by mouth daily.      Coenzyme Q10 (CO Q-10 PO) Take 300 mg by mouth 2 (two) times a week. Take on Mon and Fri.    metFORMIN (GLUCOPHAGE) 1000 MG tablet Take 1 tablet by mouth Daily.     ondansetron (ZOFRAN) 4 MG tablet Take 1 tablet (4 mg total) by mouth every 8 (eight) hours as needed for nausea or vomiting. Qty: 20 tablet, Refills: 0    pantoprazole (PROTONIX) 40 MG tablet Take 1 tablet (40 mg total) by mouth daily. Qty: 30 tablet, Refills: 6    metoprolol succinate (TOPROL-XL) 25 MG 24 hr tablet Take 25 mg by mouth daily.    ramipril (ALTACE) 5 MG capsule Take 1 tablet by mouth Daily.        Allergies  Allergen Reactions   . Sulfonamide Derivatives    Follow-up Information    Follow up with [name], MD In 1 week.   Specialty:  Internal Medicine   Contact information:   [address] [place] [id number] [phone]        The results of significant diagnostics from this hospitalization (including imaging, microbiology, ancillary and laboratory) are listed below for reference.    Significant Diagnostic Studies: Ct Abdomen Pelvis Wo Contrast  02/16/2015   CLINICAL DATA:  Forty bowel weight loss.  PICC up spur 6 days.  EXAM: CT CHEST, ABDOMEN AND PELVIS WITHOUT CONTRAST  TECHNIQUE: Multidetector CT imaging of the chest, abdomen and pelvis was performed following the standard protocol without IV contrast.  COMPARISON:  10/13/2014.  06/15/2012.  FINDINGS: CT CHEST FINDINGS  Atherosclerotic changes of  the great vessels and aortic arch. Postoperative changes from CABG. Coronary artery calcification. No abnormal mediastinal adenopathy.  No pneumothorax.  No pleural effusion  Increased linear markings in both lower lobes associated with emphysema is compatible with chronic pulmonary parenchymal disease. No mass or consolidation. The appearance of the lungs is stable. Minimal patchy density at the right lung base laterally on image 56 is likely related to volume loss.  No destructive bone lesion.  CT ABDOMEN AND PELVIS FINDINGS  Liver, gallbladder, spleen, pancreas, adrenal glands are within normal limits. Calcifications within the central kidneys are likely vascular. No hydronephrosis.  Normal appendix.  Left inguinal hernia contains adipose tissue  Bladder and prostate are within normal limits  Maximal transverse diameter of the infrarenal aorta is 4.0 cm. This is not significantly changed. There are extensive vascular calcifications involving the iliac arteries.  Bilateral L4 pars defects with spondylolisthesis is stable. Extensive degenerative changes throughout the lumbar spine.  IMPRESSION: Chronic pulmonary  disease.  No evidence of lung mass or malignancy.  Chronic changes in the abdomen.  Abdominal aortic aneurysm with a maximal diameter of 4.0 cm. Recommend followup by ultrasound in 1 year. This recommendation follows ACR consensus guidelines: White Paper of the ACR Incidental Findings Committee II on Vascular Findings. J Am Coll Radiol 2013; 10:789-794.  Stable left inguinal hernia.   Electronically Signed   By: Marybelle Killings M.D.   On: 02/16/2015 19:44   Ct Chest Wo Contrast  02/16/2015   CLINICAL DATA:  Forty bowel weight loss.  PICC up spur 6 days.  EXAM: CT CHEST, ABDOMEN AND PELVIS WITHOUT CONTRAST  TECHNIQUE: Multidetector CT imaging of the chest, abdomen and pelvis was performed following the standard protocol without IV contrast.  COMPARISON:  10/13/2014.  06/15/2012.  FINDINGS: CT CHEST FINDINGS  Atherosclerotic changes of the great vessels and aortic arch. Postoperative changes from CABG. Coronary artery calcification. No abnormal mediastinal adenopathy.  No pneumothorax.  No pleural effusion  Increased linear markings in both lower lobes associated with emphysema is compatible with chronic pulmonary parenchymal disease. No mass or consolidation. The appearance of the lungs is stable. Minimal patchy density at the right lung base laterally on image 56 is likely related to volume loss.  No destructive bone lesion.  CT ABDOMEN AND PELVIS FINDINGS  Liver, gallbladder, spleen, pancreas, adrenal glands are within normal limits. Calcifications within the central kidneys are likely vascular. No hydronephrosis.  Normal appendix.  Left inguinal hernia contains adipose tissue  Bladder and prostate are within normal limits  Maximal transverse diameter of the infrarenal aorta is 4.0 cm. This is not significantly changed. There are extensive vascular calcifications involving the iliac arteries.  Bilateral L4 pars defects with spondylolisthesis is stable. Extensive degenerative changes throughout the lumbar spine.   IMPRESSION: Chronic pulmonary disease.  No evidence of lung mass or malignancy.  Chronic changes in the abdomen.  Abdominal aortic aneurysm with a maximal diameter of 4.0 cm. Recommend followup by ultrasound in 1 year. This recommendation follows ACR consensus guidelines: White Paper of the ACR Incidental Findings Committee II on Vascular Findings. J Am Coll Radiol 2013; 10:789-794.  Stable left inguinal hernia.   Electronically Signed   By: Marybelle Killings M.D.   On: 02/16/2015 19:44    Microbiology: No results found for this or any previous visit (from the past 240 hour(s)).   Labs: Basic Metabolic Panel:  Recent Labs Lab 02/16/15 1755 02/17/15 0416  NA 122* 129*  K 3.9 3.6  CL 86*  96  CO2 27 27  GLUCOSE 147* 117*  BUN 12 9  CREATININE 0.69 0.72  CALCIUM 9.1 8.4   Liver Function Tests:  Recent Labs Lab 02/16/15 1755  AST 21  ALT 16  ALKPHOS 67  BILITOT 0.7  PROT 7.3  ALBUMIN 4.2   No results for input(s): LIPASE, AMYLASE in the last 168 hours. No results for input(s): AMMONIA in the last 168 hours. CBC:  Recent Labs Lab 02/16/15 1755 02/17/15 0416  WBC 10.4 7.6  HGB 15.2 13.5  HCT 44.1 39.2  MCV 87.0 86.7  PLT 166 147*   Cardiac Enzymes: No results for input(s): CKTOTAL, CKMB, CKMBINDEX, TROPONINI in the last 168 hours. BNP: BNP (last 3 results) No results for input(s): BNP in the last 8760 hours.  ProBNP (last 3 results) No results for input(s): PROBNP in the last 8760 hours.  CBG:  Recent Labs Lab 02/16/15 2056 02/17/15 0003 02/17/15 0538 02/17/15 0729  GLUCAP 164* 110* 108* 122*       Signed:  Kelvin Cellar  [facility] 02/17/2015, 10:56 AM

## 2015-02-17 NOTE — Progress Notes (Signed)
UR completed 

## 2015-02-17 NOTE — Progress Notes (Signed)
Pt discharged home with his wife. Reviewed DC meds and paper work. There were no further questions.

## 2015-02-24 DIAGNOSIS — E871 Hypo-osmolality and hyponatremia: Secondary | ICD-10-CM | POA: Diagnosis not present

## 2015-02-24 DIAGNOSIS — R066 Hiccough: Secondary | ICD-10-CM | POA: Diagnosis not present

## 2015-02-24 DIAGNOSIS — I70219 Atherosclerosis of native arteries of extremities with intermittent claudication, unspecified extremity: Secondary | ICD-10-CM | POA: Diagnosis not present

## 2015-02-24 DIAGNOSIS — I739 Peripheral vascular disease, unspecified: Secondary | ICD-10-CM | POA: Diagnosis not present

## 2015-02-25 ENCOUNTER — Other Ambulatory Visit: Payer: Self-pay | Admitting: *Deleted

## 2015-02-25 DIAGNOSIS — I714 Abdominal aortic aneurysm, without rupture, unspecified: Secondary | ICD-10-CM

## 2015-02-25 DIAGNOSIS — R0989 Other specified symptoms and signs involving the circulatory and respiratory systems: Secondary | ICD-10-CM

## 2015-02-27 ENCOUNTER — Ambulatory Visit (HOSPITAL_COMMUNITY)
Admission: RE | Admit: 2015-02-27 | Discharge: 2015-02-27 | Disposition: A | Discharge status: Home / Self Care | Payer: Medicare Other | Source: Ambulatory Visit | Attending: Vascular Surgery | Admitting: Vascular Surgery

## 2015-02-27 ENCOUNTER — Other Ambulatory Visit: Payer: Self-pay | Admitting: *Deleted

## 2015-02-27 DIAGNOSIS — F17200 Nicotine dependence, unspecified, uncomplicated: Secondary | ICD-10-CM | POA: Insufficient documentation

## 2015-02-27 DIAGNOSIS — I70219 Atherosclerosis of native arteries of extremities with intermittent claudication, unspecified extremity: Secondary | ICD-10-CM

## 2015-02-27 DIAGNOSIS — E119 Type 2 diabetes mellitus without complications: Secondary | ICD-10-CM | POA: Insufficient documentation

## 2015-03-03 ENCOUNTER — Other Ambulatory Visit: Payer: Self-pay | Admitting: *Deleted

## 2015-03-03 DIAGNOSIS — I739 Peripheral vascular disease, unspecified: Secondary | ICD-10-CM

## 2015-03-06 ENCOUNTER — Encounter: Payer: Self-pay | Admitting: Vascular Surgery

## 2015-03-10 ENCOUNTER — Encounter: Payer: Self-pay | Admitting: Vascular Surgery

## 2015-03-10 ENCOUNTER — Other Ambulatory Visit: Payer: Self-pay

## 2015-03-10 ENCOUNTER — Ambulatory Visit (INDEPENDENT_AMBULATORY_CARE_PROVIDER_SITE_OTHER): Payer: Medicare Other | Admitting: Vascular Surgery

## 2015-03-10 ENCOUNTER — Encounter (HOSPITAL_COMMUNITY): Payer: PRIVATE HEALTH INSURANCE

## 2015-03-10 VITALS — BP 136/70 | HR 64 | Resp 18 | Ht 74.0 in | Wt 180.4 lb

## 2015-03-10 DIAGNOSIS — I739 Peripheral vascular disease, unspecified: Secondary | ICD-10-CM | POA: Diagnosis not present

## 2015-03-10 DIAGNOSIS — I70219 Atherosclerosis of native arteries of extremities with intermittent claudication, unspecified extremity: Secondary | ICD-10-CM

## 2015-03-10 NOTE — Progress Notes (Signed)
Patient name: [name] MRN: [id number] DOB: [birth year] Sex: male   Referred by: Brigitte Pulse  Reason for referral:  Chief Complaint  Patient presents with  . Re-evaluation    worsening leg pain    HISTORY OF PRESENT ILLNESS: Patient resents today for continued discussion of his lower extremity arterial insufficiency. He is here today with his wife and his son. He has a very complex past history. Also has a small infrarenal abdominal aortic aneurysm which is been followed with serial exams. He has multiple causes for his lower extremity pain. He certainly does have severe arterial insufficiency. Also has lumbar disc disease and some arthritic changes. He reports that his claudication symptoms have dramatically progress. Is unable to walk more than half a block without severe pain. Prior to this was quite active. He does have some resting symptoms as well. On sitting on the exam table he reports that he does have some low back pain that extends down into his calves. This is significant but is not as limiting as his exercise pain.  Past Medical History  Diagnosis Date  . Cataract   . Diabetes mellitus   . Hyperlipidemia   . Hypertension   . Helicobacter pylori gastritis 01/2006, 03/2011    EGD - Pylera Tx 2010  . CAD (coronary artery disease)   . PAD (peripheral artery disease)   . Hiccups     Past Surgical History  Procedure Laterality Date  . Total knee arthroplasty  07-13-2012    right  . Hernia repair  12/24/2010  . Cataract extraction      right  . Colonoscopy  2002, 2007, 06/09/2011    Dr. Evelina Bucy - [name], [place]: for FHx colon cancer, no adenomas; 2012: [facility] - normal  . Esophagogastroduodenoscopy  02/20/06    Dr. Evelina Bucy, [name], [place]. pylori gastritis and GERD changes (pathology)  . Upper gastrointestinal endoscopy  03/09/2009    Dr. Carlean Purl: H. pylori gastritis (Pylera Tx) and 2 cm hiatal hernia  . Coronary artery bypass graft  05-07-2008  . Joint replacement     right knee replacement    History   Social History  . Marital Status: Married    Spouse Name: N/A  . Number of Children: 2  . Years of Education: N/A   Occupational History  . Retired    Social History Main Topics  . Smoking status: Current Every Day Smoker -- 0.25 packs/day for 40 years    Types: Cigarettes  . Smokeless tobacco: Never Used  . Alcohol Use: No  . Drug Use: No  . Sexual Activity: Not on file   Other Topics Concern  . Not on file   Social History Narrative    Family History  Problem Relation Age of Onset  . Colon cancer Neg Hx   . Heart disease Father   . Diabetes Father   . Heart attack Father   . Hypertension Father   . Varicose Veins Father   . Cancer Sister     colon  . Diabetes Sister   . Other Sister     varicose veins  . Heart attack Sister   . Heart disease Sister   . Hyperlipidemia Sister   . Hypertension Sister   . Varicose Veins Sister   . Cancer Brother     prostate  . Diabetes Brother   . Heart disease Brother   . Hyperlipidemia Brother   . Hypertension Brother   . Other Brother  varicose veins  . Heart attack Brother   . Varicose Veins Brother   . Heart disease Daughter   . Hyperlipidemia Daughter   . Hypertension Daughter   . Cancer Sister     lukemia    Allergies as of 03/10/2015 - Review Complete 03/10/2015  Allergen Reaction Noted  . Sulfonamide derivatives  03/12/2009    Current Outpatient Prescriptions on File Prior to Visit  Medication Sig Dispense Refill  . aspirin 81 MG tablet Take 81 mg by mouth daily.      . Coenzyme Q10 (CO Q-10 PO) Take 300 mg by mouth 2 (two) times a week. Take on Mon and Fri.    . metFORMIN (GLUCOPHAGE) 1000 MG tablet Take 1 tablet by mouth Daily.     . metoprolol succinate (TOPROL-XL) 25 MG 24 hr tablet Take 25 mg by mouth daily.    . pantoprazole (PROTONIX) 40 MG tablet Take 1 tablet (40 mg total) by mouth daily. 30 tablet 6  . ramipril (ALTACE) 5 MG capsule Take 1 tablet by  mouth Daily.     . chlorproMAZINE (THORAZINE) 25 MG tablet Take 0.5 tablets (12.5 mg total) by mouth 3 (three) times daily as needed for hiccoughs. (Patient not taking: Reported on 03/10/2015) 20 tablet 1  . ondansetron (ZOFRAN) 4 MG tablet Take 1 tablet (4 mg total) by mouth every 8 (eight) hours as needed for nausea or vomiting. (Patient not taking: Reported on 03/10/2015) 20 tablet 0   No current facility-administered medications on file prior to visit.        PHYSICAL EXAMINATION:  General: The patient is a well-nourished male, in no acute distress. Vital signs are BP 136/70 mmHg  Pulse 64  Resp 18  Ht 6\' 2"  (1.88 m)  Wt 180 lb 6.4 oz (81.829 kg)  BMI 23.15 kg/m2 Pulmonary: There is a good air exchange  Abdomen: Soft and non-tender with no palpable aneurysm Musculoskeletal: There are no major deformities.  There is no significant extremity pain. Neurologic: No focal weakness or paresthesias are detected, Skin: There are no ulcer or rashes noted. Psychiatric: Alert and oriented Cardiovascular: 2+ brachial and radial pulses. No palpable femoral or distal pulses.   [facility] Vascular Lab Studies:  Ordered and Independently Reviewed this does show monophasic waveforms both lower extremity. There has been some drop in his ankle arm index to 0.7 on the right and 0.65 on the left. He does have severe extreme calcification taking these measurements somewhat falsely elevated  I reviewed his CT scan for evaluation of weight loss. This was from April 2016. He does have a 4 cm aneurysm. He has extreme calcification of his aorta and iliac arteries. This was a noncontrast study. It did show what appears to be totally or near total occlusion of his left and severe stenosis of his right common iliac artery based on calcification Impression and Plan:  Had long discussion with the patient and his family present. I do not feel that he has limb threatening ischemia currently. He is very uncomfortable  regarding his walking ability. I explained the only option would be arteriography for further evaluation to determine what would be required for improved arterial flow. I suspect that his main issue is aortoiliac occlusive disease and also superficial femoral artery occlusive disease. I do not feel that he is a great candidate for open aortobifemoral bypass. This would be his best anatomic option all likelihood. Could be a candidate for axillofemoral femorofemoral bypass. We'll proceed with arteriography for further  evaluation and make recommendations. I explained that he may require brachial approach for arteriogram due to his severe iliac occlusive disease.    Curt Jews [facility] Office: [phone]

## 2015-03-11 ENCOUNTER — Ambulatory Visit (HOSPITAL_COMMUNITY)
Admission: RE | Admit: 2015-03-11 | Discharge: 2015-03-11 | Disposition: A | Discharge status: Home / Self Care | Payer: Medicare Other | Source: Ambulatory Visit | Attending: Vascular Surgery | Admitting: Vascular Surgery

## 2015-03-11 ENCOUNTER — Encounter (HOSPITAL_COMMUNITY)
Admission: RE | Disposition: A | Discharge status: Home / Self Care | Payer: Medicare Other | Source: Ambulatory Visit | Attending: Vascular Surgery

## 2015-03-11 DIAGNOSIS — I10 Essential (primary) hypertension: Secondary | ICD-10-CM | POA: Diagnosis not present

## 2015-03-11 DIAGNOSIS — E119 Type 2 diabetes mellitus without complications: Secondary | ICD-10-CM | POA: Diagnosis not present

## 2015-03-11 DIAGNOSIS — I2510 Atherosclerotic heart disease of native coronary artery without angina pectoris: Secondary | ICD-10-CM | POA: Diagnosis not present

## 2015-03-11 DIAGNOSIS — I714 Abdominal aortic aneurysm, without rupture: Secondary | ICD-10-CM | POA: Insufficient documentation

## 2015-03-11 DIAGNOSIS — Z7982 Long term (current) use of aspirin: Secondary | ICD-10-CM | POA: Insufficient documentation

## 2015-03-11 DIAGNOSIS — F17210 Nicotine dependence, cigarettes, uncomplicated: Secondary | ICD-10-CM | POA: Insufficient documentation

## 2015-03-11 DIAGNOSIS — Z951 Presence of aortocoronary bypass graft: Secondary | ICD-10-CM | POA: Insufficient documentation

## 2015-03-11 DIAGNOSIS — E785 Hyperlipidemia, unspecified: Secondary | ICD-10-CM | POA: Diagnosis not present

## 2015-03-11 DIAGNOSIS — Z96651 Presence of right artificial knee joint: Secondary | ICD-10-CM | POA: Diagnosis not present

## 2015-03-11 DIAGNOSIS — I70213 Atherosclerosis of native arteries of extremities with intermittent claudication, bilateral legs: Secondary | ICD-10-CM | POA: Insufficient documentation

## 2015-03-11 DIAGNOSIS — I739 Peripheral vascular disease, unspecified: Secondary | ICD-10-CM | POA: Diagnosis not present

## 2015-03-11 DIAGNOSIS — I70203 Unspecified atherosclerosis of native arteries of extremities, bilateral legs: Secondary | ICD-10-CM | POA: Diagnosis present

## 2015-03-11 HISTORY — PX: PERIPHERAL VASCULAR CATHETERIZATION: SHX172C

## 2015-03-11 LAB — POCT I-STAT, CHEM 8
BUN: 20 mg/dL (ref 6–20)
Calcium, Ion: 1.22 mmol/L (ref 1.13–1.30)
Chloride: 91 mmol/L — ABNORMAL LOW (ref 101–111)
Creatinine, Ser: 0.8 mg/dL (ref 0.61–1.24)
GLUCOSE: 134 mg/dL — AB (ref 70–99)
HCT: 46 % (ref 39.0–52.0)
Hemoglobin: 15.6 g/dL (ref 13.0–17.0)
Potassium: 4.1 mmol/L (ref 3.5–5.1)
Sodium: 129 mmol/L — ABNORMAL LOW (ref 135–145)
TCO2: 25 mmol/L (ref 0–100)

## 2015-03-11 LAB — GLUCOSE, CAPILLARY: Glucose-Capillary: 98 mg/dL (ref 70–99)

## 2015-03-11 SURGERY — ABDOMINAL AORTOGRAM W/LOWER EXTREMITY

## 2015-03-11 MED ORDER — ACETAMINOPHEN 325 MG PO TABS: 650.0000 mg | ORAL_TABLET | ORAL | Status: DC | PRN

## 2015-03-11 MED ORDER — SODIUM CHLORIDE 0.9 % IV SOLN: 1.0000 mL/kg/h | INTRAVENOUS | Status: DC

## 2015-03-11 MED ORDER — OXYCODONE-ACETAMINOPHEN 5-325 MG PO TABS: 1.0000 | ORAL_TABLET | ORAL | Status: DC | PRN

## 2015-03-11 MED ORDER — IODIXANOL 320 MG/ML IV SOLN
INTRAVENOUS | Status: DC | PRN
  Administered 2015-03-11: 150 mL via INTRA_ARTERIAL

## 2015-03-11 MED ORDER — MIDAZOLAM HCL 2 MG/2ML IJ SOLN
INTRAMUSCULAR | Status: DC | PRN
Start: 2015-03-11 — End: 2015-03-11
  Administered 2015-03-11: 1 mg via INTRAVENOUS

## 2015-03-11 MED ORDER — HEPARIN (PORCINE) IN NACL 2-0.9 UNIT/ML-% IJ SOLN
INTRAMUSCULAR | Status: AC
  Filled 2015-03-11: qty 1000

## 2015-03-11 MED ORDER — LIDOCAINE HCL (PF) 1 % IJ SOLN
INTRAMUSCULAR | Status: AC
  Filled 2015-03-11: qty 30

## 2015-03-11 MED ORDER — FENTANYL CITRATE (PF) 100 MCG/2ML IJ SOLN
INTRAMUSCULAR | Status: AC
  Filled 2015-03-11: qty 2

## 2015-03-11 MED ORDER — SODIUM CHLORIDE 0.9 % IV SOLN
INTRAVENOUS | Status: DC
  Administered 2015-03-11: 12:00:00 via INTRAVENOUS

## 2015-03-11 MED ORDER — MIDAZOLAM HCL 2 MG/2ML IJ SOLN
INTRAMUSCULAR | Status: AC
  Filled 2015-03-11: qty 2

## 2015-03-11 SURGICAL SUPPLY — 9 items
CATH BEACON 5.038 65CM KMP-01 (CATHETERS) ×1 IMPLANT
COVER PRB 48X5XTLSCP FOLD TPE (BAG) IMPLANT
COVER PROBE 5X48 (BAG) ×2
KIT PV (KITS) ×2 IMPLANT
SHEATH PINNACLE 5F 10CM (SHEATH) ×1 IMPLANT
SYR MEDRAD MARK V 150ML (SYRINGE) ×1 IMPLANT
TRANSDUCER W/STOPCOCK (MISCELLANEOUS) ×2 IMPLANT
TRAY PV CATH (CUSTOM PROCEDURE TRAY) ×2 IMPLANT
WIRE HITORQ VERSACORE ST 145CM (WIRE) ×1 IMPLANT

## 2015-03-11 NOTE — Discharge Instructions (Signed)

## 2015-03-11 NOTE — Progress Notes (Signed)
Site area: rt groin Site Prior to Removal:  Level 0 Pressure Applied For: 20 mnutes Manual:   yes Patient Status During Pull:  stable Post Pull Site:  Level  0 Post Pull Instructions Given:  yes Post Pull Pulses Present: yes Dressing Applied:  tegaderm Bedrest begins @  0973 Comments: none

## 2015-03-11 NOTE — H&P (View-Only) (Signed)
Patient name: [name] MRN: [id number] DOB: [birth year] Sex: male   Referred by: Brigitte Pulse  Reason for referral:  Chief Complaint  Patient presents with  . Re-evaluation    worsening leg pain    HISTORY OF PRESENT ILLNESS: Patient resents today for continued discussion of his lower extremity arterial insufficiency. He is here today with his wife and his son. He has a very complex past history. Also has a small infrarenal abdominal aortic aneurysm which is been followed with serial exams. He has multiple causes for his lower extremity pain. He certainly does have severe arterial insufficiency. Also has lumbar disc disease and some arthritic changes. He reports that his claudication symptoms have dramatically progress. Is unable to walk more than half a block without severe pain. Prior to this was quite active. He does have some resting symptoms as well. On sitting on the exam table he reports that he does have some low back pain that extends down into his calves. This is significant but is not as limiting as his exercise pain.  Past Medical History  Diagnosis Date  . Cataract   . Diabetes mellitus   . Hyperlipidemia   . Hypertension   . Helicobacter pylori gastritis 01/2006, 03/2011    EGD - Pylera Tx 2010  . CAD (coronary artery disease)   . PAD (peripheral artery disease)   . Hiccups     Past Surgical History  Procedure Laterality Date  . Total knee arthroplasty  07-13-2012    right  . Hernia repair  12/24/2010  . Cataract extraction      right  . Colonoscopy  2002, 2007, 06/09/2011    Dr. Evelina Bucy - [name], [place]: for FHx colon cancer, no adenomas; 2012: [facility] - normal  . Esophagogastroduodenoscopy  02/20/06    Dr. Evelina Bucy, [name], [place]. pylori gastritis and GERD changes (pathology)  . Upper gastrointestinal endoscopy  03/09/2009    Dr. Carlean Purl: H. pylori gastritis (Pylera Tx) and 2 cm hiatal hernia  . Coronary artery bypass graft  05-07-2008  . Joint replacement     right knee replacement    History   Social History  . Marital Status: Married    Spouse Name: N/A  . Number of Children: 2  . Years of Education: N/A   Occupational History  . Retired    Social History Main Topics  . Smoking status: Current Every Day Smoker -- 0.25 packs/day for 40 years    Types: Cigarettes  . Smokeless tobacco: Never Used  . Alcohol Use: No  . Drug Use: No  . Sexual Activity: Not on file   Other Topics Concern  . Not on file   Social History Narrative    Family History  Problem Relation Age of Onset  . Colon cancer Neg Hx   . Heart disease Father   . Diabetes Father   . Heart attack Father   . Hypertension Father   . Varicose Veins Father   . Cancer Sister     colon  . Diabetes Sister   . Other Sister     varicose veins  . Heart attack Sister   . Heart disease Sister   . Hyperlipidemia Sister   . Hypertension Sister   . Varicose Veins Sister   . Cancer Brother     prostate  . Diabetes Brother   . Heart disease Brother   . Hyperlipidemia Brother   . Hypertension Brother   . Other Brother  varicose veins  . Heart attack Brother   . Varicose Veins Brother   . Heart disease Daughter   . Hyperlipidemia Daughter   . Hypertension Daughter   . Cancer Sister     lukemia    Allergies as of 03/10/2015 - Review Complete 03/10/2015  Allergen Reaction Noted  . Sulfonamide derivatives  03/12/2009    Current Outpatient Prescriptions on File Prior to Visit  Medication Sig Dispense Refill  . aspirin 81 MG tablet Take 81 mg by mouth daily.      . Coenzyme Q10 (CO Q-10 PO) Take 300 mg by mouth 2 (two) times a week. Take on Mon and Fri.    . metFORMIN (GLUCOPHAGE) 1000 MG tablet Take 1 tablet by mouth Daily.     . metoprolol succinate (TOPROL-XL) 25 MG 24 hr tablet Take 25 mg by mouth daily.    . pantoprazole (PROTONIX) 40 MG tablet Take 1 tablet (40 mg total) by mouth daily. 30 tablet 6  . ramipril (ALTACE) 5 MG capsule Take 1 tablet by  mouth Daily.     . chlorproMAZINE (THORAZINE) 25 MG tablet Take 0.5 tablets (12.5 mg total) by mouth 3 (three) times daily as needed for hiccoughs. (Patient not taking: Reported on 03/10/2015) 20 tablet 1  . ondansetron (ZOFRAN) 4 MG tablet Take 1 tablet (4 mg total) by mouth every 8 (eight) hours as needed for nausea or vomiting. (Patient not taking: Reported on 03/10/2015) 20 tablet 0   No current facility-administered medications on file prior to visit.        PHYSICAL EXAMINATION:  General: The patient is a well-nourished male, in no acute distress. Vital signs are BP 136/70 mmHg  Pulse 64  Resp 18  Ht 6\' 2"  (1.88 m)  Wt 180 lb 6.4 oz (81.829 kg)  BMI 23.15 kg/m2 Pulmonary: There is a good air exchange  Abdomen: Soft and non-tender with no palpable aneurysm Musculoskeletal: There are no major deformities.  There is no significant extremity pain. Neurologic: No focal weakness or paresthesias are detected, Skin: There are no ulcer or rashes noted. Psychiatric: Alert and oriented Cardiovascular: 2+ brachial and radial pulses. No palpable femoral or distal pulses.   [facility] Vascular Lab Studies:  Ordered and Independently Reviewed this does show monophasic waveforms both lower extremity. There has been some drop in his ankle arm index to 0.7 on the right and 0.65 on the left. He does have severe extreme calcification taking these measurements somewhat falsely elevated  I reviewed his CT scan for evaluation of weight loss. This was from April 2016. He does have a 4 cm aneurysm. He has extreme calcification of his aorta and iliac arteries. This was a noncontrast study. It did show what appears to be totally or near total occlusion of his left and severe stenosis of his right common iliac artery based on calcification Impression and Plan:  Had Stephen discussion with the patient and his family present. I do not feel that he has limb threatening ischemia currently. He is very uncomfortable  regarding his walking ability. I explained the only option would be arteriography for further evaluation to determine what would be required for improved arterial flow. I suspect that his main issue is aortoiliac occlusive disease and also superficial femoral artery occlusive disease. I do not feel that he is a great candidate for open aortobifemoral bypass. This would be his best anatomic option all likelihood. Could be a candidate for axillofemoral femorofemoral bypass. We'll proceed with arteriography for further  evaluation and make recommendations. I explained that he may require brachial approach for arteriogram due to his severe iliac occlusive disease.    Curt Jews [facility] [place] Office: [phone]

## 2015-03-11 NOTE — Interval H&P Note (Signed)
History and Physical Interval Note:  03/11/2015 2:20 PM  [name]  has presented today for surgery, with the diagnosis of PVD  The various methods of treatment have been discussed with the patient and family. After consideration of risks, benefits and other options for treatment, the patient has consented to  Procedure(s): Abdominal Aortogram w/Lower Extremity (N/A) as a surgical intervention .  The patient's history has been reviewed, patient examined, no change in status, stable for surgery.  I have reviewed the patient's chart and labs.  Questions were answered to the patient's satisfaction.     Curt Jews

## 2015-03-12 ENCOUNTER — Telehealth: Payer: Self-pay | Admitting: Cardiology

## 2015-03-12 ENCOUNTER — Encounter (HOSPITAL_COMMUNITY): Payer: Self-pay | Admitting: Vascular Surgery

## 2015-03-12 MED FILL — Lidocaine HCl Local Preservative Free (PF) Inj 1%: INTRAMUSCULAR | Qty: 30 | Status: AC

## 2015-03-12 NOTE — Telephone Encounter (Signed)
Left message for surgical scheduler to call w/ procedure information/date.

## 2015-03-12 NOTE — Telephone Encounter (Signed)
Pt needs clarence for upcoming procedure.

## 2015-03-13 ENCOUNTER — Encounter: Payer: Self-pay | Admitting: Internal Medicine

## 2015-03-16 ENCOUNTER — Telehealth: Payer: Self-pay | Admitting: Cardiology

## 2015-03-16 NOTE — Telephone Encounter (Signed)
NEED CLEARANCE FOR SURGERY  WITH DR EARLY  AORTOBIFEMORAL BYPASS  SURGERY HAS NOT BEEN SCHEDULE AWAITING DR [place] PLEAS NOTIFY STEPHANIE AT DR EARLY'S OFFICE.

## 2015-03-16 NOTE — Telephone Encounter (Signed)
Left message on stephanie's voicemail to review patient's telephone call message.

## 2015-03-16 NOTE — Telephone Encounter (Signed)
He is clear from a cardiac standpoint for aortobifemoral bypass.  Delio Slates [place] MD, [facility]

## 2015-03-17 NOTE — Telephone Encounter (Signed)
Returned call to Curtice at [employer] office.Dr.Jordan cleared patient for up coming surgery.

## 2015-03-18 ENCOUNTER — Other Ambulatory Visit: Payer: Self-pay

## 2015-03-19 DIAGNOSIS — I739 Peripheral vascular disease, unspecified: Secondary | ICD-10-CM | POA: Diagnosis not present

## 2015-03-19 DIAGNOSIS — E871 Hypo-osmolality and hyponatremia: Secondary | ICD-10-CM | POA: Diagnosis not present

## 2015-03-19 DIAGNOSIS — I70219 Atherosclerosis of native arteries of extremities with intermittent claudication, unspecified extremity: Secondary | ICD-10-CM | POA: Diagnosis not present

## 2015-03-19 DIAGNOSIS — R066 Hiccough: Secondary | ICD-10-CM | POA: Diagnosis not present

## 2015-03-19 DIAGNOSIS — Z6823 Body mass index (BMI) 23.0-23.9, adult: Secondary | ICD-10-CM | POA: Diagnosis not present

## 2015-03-19 NOTE — Pre-Procedure Instructions (Signed)
[name]  03/19/2015   Your procedure is scheduled on:  Monday May 23rd  Report to [facility] Admitting at 5:30 AM.  Call this number if you have problems the morning of surgery: [phone]   Remember:   Do not eat food or drink liquids after midnight.   Take these medicines the morning of surgery with A SIP OF WATER: Chlorpromazine (Thorazine) if needed, cilostazol (petal), metoprolol (Toprol-XL), Zofran (ondansetron) if needed, Protonix (pantoprazole)   Do not wear jewelry, make-up or nail polish.  Do not wear lotions, powders, or perfumes. You may wear deodorant.  Do not shave 48 hours prior to surgery. Men may shave face and neck.  Do not bring valuables to the hospital.  [facility] is not responsible for any belongings or valuables.               Contacts, dentures or bridgework may not be worn into surgery.  Leave suitcase in the car. After surgery it may be brought to your room.  For patients admitted to the hospital, discharge time is determined by your treatment team.               Patients discharged the day of surgery will not be allowed to drive home.    Special Instructions: Shower using CHG 2 nights before surgery and the night before surgery.  If you shower the day of surgery use CHG.  Use special wash - you have one bottle of CHG for all showers.  You should use approximately 1/3 of the bottle for each shower.   Please read over the following fact sheets that you were given: Pain Booklet, Coughing and Deep Breathing, Blood Transfusion Information, MRSA Information and Surgical Site Infection Prevention

## 2015-03-20 ENCOUNTER — Encounter (HOSPITAL_COMMUNITY)
Admission: RE | Admit: 2015-03-20 | Discharge: 2015-03-20 | Disposition: A | Discharge status: Home / Self Care | Payer: Medicare Other | Source: Ambulatory Visit | Attending: Vascular Surgery | Admitting: Vascular Surgery

## 2015-03-20 ENCOUNTER — Encounter (HOSPITAL_COMMUNITY): Payer: Self-pay

## 2015-03-20 HISTORY — DX: Personal history of urinary calculi: Z87.442

## 2015-03-20 HISTORY — DX: Unspecified osteoarthritis, unspecified site: M19.90

## 2015-03-20 HISTORY — DX: Gastro-esophageal reflux disease without esophagitis: K21.9

## 2015-03-20 LAB — COMPREHENSIVE METABOLIC PANEL
ALT: 14 U/L — ABNORMAL LOW (ref 17–63)
AST: 22 U/L (ref 15–41)
Albumin: 4.2 g/dL (ref 3.5–5.0)
Alkaline Phosphatase: 80 U/L (ref 38–126)
Anion gap: 9 (ref 5–15)
BUN: 12 mg/dL (ref 6–20)
CO2: 28 mmol/L (ref 22–32)
CREATININE: 0.92 mg/dL (ref 0.61–1.24)
Calcium: 9.7 mg/dL (ref 8.9–10.3)
Chloride: 95 mmol/L — ABNORMAL LOW (ref 101–111)
GFR calc Af Amer: >60 mL/min (ref >60)
GFR calc non Af Amer: >60 mL/min (ref >60)
Glucose, Bld: 145 mg/dL — ABNORMAL HIGH (ref 65–99)
Potassium: 4.5 mmol/L (ref 3.5–5.1)
Sodium: 132 mmol/L — ABNORMAL LOW (ref 135–145)
Total Bilirubin: 0.7 mg/dL (ref 0.3–1.2)
Total Protein: 7.6 g/dL (ref 6.5–8.1)

## 2015-03-20 LAB — CBC
HCT: 45.1 % (ref 39.0–52.0)
Hemoglobin: 15.7 g/dL (ref 13.0–17.0)
MCH: 30.1 pg (ref 26.0–34.0)
MCHC: 34.8 g/dL (ref 30.0–36.0)
MCV: 86.4 fL (ref 78.0–100.0)
Platelets: 178 10*3/uL (ref 150–400)
RBC: 5.22 MIL/uL (ref 4.22–5.81)
RDW: 13.2 % (ref 11.5–15.5)
WBC: 9.3 10*3/uL (ref 4.0–10.5)

## 2015-03-20 LAB — SURGICAL PCR SCREEN
MRSA, PCR: NEGATIVE
Staphylococcus aureus: NEGATIVE

## 2015-03-20 LAB — URINALYSIS, ROUTINE W REFLEX MICROSCOPIC
BILIRUBIN URINE: NEGATIVE
Glucose, UA: NEGATIVE mg/dL
Hgb urine dipstick: NEGATIVE
Ketones, ur: NEGATIVE mg/dL
Leukocytes, UA: NEGATIVE
Nitrite: NEGATIVE
PH: 5.5 (ref 5.0–8.0)
PROTEIN: NEGATIVE mg/dL
SPECIFIC GRAVITY, URINE: 1.018 (ref 1.005–1.030)
Urobilinogen, UA: 1 mg/dL (ref 0.0–1.0)

## 2015-03-20 LAB — GLUCOSE, CAPILLARY: Glucose-Capillary: 149 mg/dL — ABNORMAL HIGH (ref 65–99)

## 2015-03-20 LAB — TYPE AND SCREEN
ABO/RH(D): B POS
Antibody Screen: NEGATIVE

## 2015-03-20 LAB — PROTIME-INR
INR: 1.13 (ref 0.00–1.49)
PROTHROMBIN TIME: 14.6 s (ref 11.6–15.2)

## 2015-03-20 LAB — APTT: aPTT: 31 seconds (ref 24–37)

## 2015-03-20 NOTE — Pre-Procedure Instructions (Addendum)
[name]  03/20/2015   Your procedure is scheduled on:  Monday May 23rd  Report to [facility] Admitting at 5:30 AM.  Call this number if you have problems the morning of surgery: [phone]   Remember:   Do not eat food or drink liquids after midnight.   Take these medicines the morning of surgery with A SIP OF WATER none nite time meds     STOP all herbel meds, nsaids (aleve,naproxen,advil,ibuprofen) 5 days prior to surgery starting now including coq10,              PLETAL, ASPIRIN  per dr    Rayne Du Metformin am of surgery   Do not wear jewelry, make-up or nail polish.  Do not wear lotions, powders, or perfumes. You may wear deodorant.  Do not shave 48 hours prior to surgery. Men may shave face and neck.  Do not bring valuables to the hospital.  [facility] is not responsible for any belongings or valuables.               Contacts, dentures or bridgework may not be worn into surgery.  Leave suitcase in the car. After surgery it may be brought to your room.  For patients admitted to the hospital, discharge time is determined by your treatment team.               Patients discharged the day of surgery will not be allowed to drive home.    Special Instructions: Shower using CHG 2 nights before surgery and the night before surgery.  If you shower the day of surgery use CHG.  Use special wash - you have one bottle of CHG for all showers.  You should use approximately 1/3 of the bottle for each shower.   Please read over the following fact sheets that you were given: Pain Booklet, Coughing and Deep Breathing, Blood Transfusion Information, MRSA Information and Surgical Site Infection Prevention

## 2015-03-21 LAB — HEMOGLOBIN A1C
HEMOGLOBIN A1C: 7.3 % — AB (ref 4.8–5.6)
MEAN PLASMA GLUCOSE: 163 mg/dL

## 2015-03-22 MED ORDER — DEXTROSE 5 % IV SOLN
1.5000 g | INTRAVENOUS | Status: AC
  Administered 2015-03-23: 1.5 g via INTRAVENOUS
  Filled 2015-03-22: qty 1.5

## 2015-03-23 ENCOUNTER — Encounter (HOSPITAL_COMMUNITY)
Admission: RE | Disposition: A | Discharge status: Home / Self Care | Payer: Self-pay | Source: Ambulatory Visit | Attending: Vascular Surgery

## 2015-03-23 ENCOUNTER — Inpatient Hospital Stay (HOSPITAL_COMMUNITY): Payer: Medicare Other | Admitting: Anesthesiology

## 2015-03-23 ENCOUNTER — Encounter (HOSPITAL_COMMUNITY): Payer: Self-pay | Admitting: General Practice

## 2015-03-23 ENCOUNTER — Inpatient Hospital Stay (HOSPITAL_COMMUNITY)
Admission: RE | Admit: 2015-03-23 | Discharge: 2015-03-24 | DRG: 272 | Disposition: A | Discharge status: Home / Self Care | Payer: Medicare Other | Source: Ambulatory Visit | Attending: Vascular Surgery | Admitting: Vascular Surgery

## 2015-03-23 DIAGNOSIS — Z951 Presence of aortocoronary bypass graft: Secondary | ICD-10-CM | POA: Diagnosis not present

## 2015-03-23 DIAGNOSIS — I70213 Atherosclerosis of native arteries of extremities with intermittent claudication, bilateral legs: Principal | ICD-10-CM | POA: Diagnosis present

## 2015-03-23 DIAGNOSIS — Z96651 Presence of right artificial knee joint: Secondary | ICD-10-CM | POA: Diagnosis present

## 2015-03-23 DIAGNOSIS — E119 Type 2 diabetes mellitus without complications: Secondary | ICD-10-CM | POA: Diagnosis present

## 2015-03-23 DIAGNOSIS — M79606 Pain in leg, unspecified: Secondary | ICD-10-CM | POA: Diagnosis not present

## 2015-03-23 DIAGNOSIS — F17210 Nicotine dependence, cigarettes, uncomplicated: Secondary | ICD-10-CM | POA: Diagnosis present

## 2015-03-23 DIAGNOSIS — I2510 Atherosclerotic heart disease of native coronary artery without angina pectoris: Secondary | ICD-10-CM | POA: Diagnosis present

## 2015-03-23 DIAGNOSIS — Z882 Allergy status to sulfonamides status: Secondary | ICD-10-CM | POA: Diagnosis not present

## 2015-03-23 DIAGNOSIS — E785 Hyperlipidemia, unspecified: Secondary | ICD-10-CM | POA: Diagnosis present

## 2015-03-23 DIAGNOSIS — I10 Essential (primary) hypertension: Secondary | ICD-10-CM | POA: Diagnosis present

## 2015-03-23 DIAGNOSIS — I714 Abdominal aortic aneurysm, without rupture: Secondary | ICD-10-CM | POA: Diagnosis present

## 2015-03-23 DIAGNOSIS — K219 Gastro-esophageal reflux disease without esophagitis: Secondary | ICD-10-CM | POA: Diagnosis not present

## 2015-03-23 DIAGNOSIS — M1990 Unspecified osteoarthritis, unspecified site: Secondary | ICD-10-CM | POA: Diagnosis not present

## 2015-03-23 DIAGNOSIS — Z7982 Long term (current) use of aspirin: Secondary | ICD-10-CM

## 2015-03-23 DIAGNOSIS — Z79899 Other long term (current) drug therapy: Secondary | ICD-10-CM

## 2015-03-23 HISTORY — PX: INSERTION OF ILIAC STENT: SHX6256

## 2015-03-23 HISTORY — PX: PATCH ANGIOPLASTY: SHX6230

## 2015-03-23 HISTORY — PX: ENDARTERECTOMY FEMORAL: SHX5804

## 2015-03-23 LAB — GLUCOSE, CAPILLARY
GLUCOSE-CAPILLARY: 158 mg/dL — AB (ref 65–99)
GLUCOSE-CAPILLARY: 276 mg/dL — AB (ref 65–99)
Glucose-Capillary: 142 mg/dL — ABNORMAL HIGH (ref 65–99)
Glucose-Capillary: 152 mg/dL — ABNORMAL HIGH (ref 65–99)
Glucose-Capillary: 199 mg/dL — ABNORMAL HIGH (ref 65–99)

## 2015-03-23 LAB — CBC
HCT: 41.8 % (ref 39.0–52.0)
Hemoglobin: 14.6 g/dL (ref 13.0–17.0)
MCH: 30.4 pg (ref 26.0–34.0)
MCHC: 34.9 g/dL (ref 30.0–36.0)
MCV: 87.1 fL (ref 78.0–100.0)
PLATELETS: 170 10*3/uL (ref 150–400)
RBC: 4.8 MIL/uL (ref 4.22–5.81)
RDW: 13.3 % (ref 11.5–15.5)
WBC: 9.3 10*3/uL (ref 4.0–10.5)

## 2015-03-23 LAB — CREATININE, SERUM
Creatinine, Ser: 0.73 mg/dL (ref 0.61–1.24)
GFR calc Af Amer: >60 mL/min (ref >60)

## 2015-03-23 SURGERY — ENDARTERECTOMY, FEMORAL
Anesthesia: General | Site: Groin | Laterality: Bilateral

## 2015-03-23 MED ORDER — FENTANYL CITRATE (PF) 100 MCG/2ML IJ SOLN
INTRAMUSCULAR | Status: DC | PRN
  Administered 2015-03-23 (×2): 25 ug via INTRAVENOUS
  Administered 2015-03-23 (×2): 50 ug via INTRAVENOUS
  Administered 2015-03-23: 100 ug via INTRAVENOUS

## 2015-03-23 MED ORDER — ENOXAPARIN SODIUM 40 MG/0.4ML ~~LOC~~ SOLN
40.0000 mg | SUBCUTANEOUS | Status: DC
  Filled 2015-03-23: qty 0.4

## 2015-03-23 MED ORDER — BISACODYL 10 MG RE SUPP: 10.0000 mg | Freq: Every day | RECTAL | Status: DC | PRN

## 2015-03-23 MED ORDER — MIDAZOLAM HCL 5 MG/5ML IJ SOLN
INTRAMUSCULAR | Status: DC | PRN
  Administered 2015-03-23 (×2): 0.5 mg via INTRAVENOUS

## 2015-03-23 MED ORDER — METOPROLOL TARTRATE 1 MG/ML IV SOLN: 2.0000 mg | INTRAVENOUS | Status: DC | PRN

## 2015-03-23 MED ORDER — NEOSTIGMINE METHYLSULFATE 10 MG/10ML IV SOLN
INTRAVENOUS | Status: DC | PRN
  Administered 2015-03-23: 4 mg via INTRAVENOUS

## 2015-03-23 MED ORDER — ROCURONIUM BROMIDE 50 MG/5ML IV SOLN
INTRAVENOUS | Status: AC
  Filled 2015-03-23: qty 2

## 2015-03-23 MED ORDER — MIDAZOLAM HCL 2 MG/2ML IJ SOLN
INTRAMUSCULAR | Status: AC
  Filled 2015-03-23: qty 2

## 2015-03-23 MED ORDER — HEPARIN SODIUM (PORCINE) 1000 UNIT/ML IJ SOLN
INTRAMUSCULAR | Status: AC
  Filled 2015-03-23: qty 1

## 2015-03-23 MED ORDER — OXYCODONE-ACETAMINOPHEN 5-325 MG PO TABS
1.0000 | ORAL_TABLET | ORAL | Status: DC | PRN
  Administered 2015-03-23: 1 via ORAL
  Filled 2015-03-23: qty 1

## 2015-03-23 MED ORDER — FENTANYL CITRATE (PF) 250 MCG/5ML IJ SOLN
INTRAMUSCULAR | Status: AC
  Filled 2015-03-23: qty 5

## 2015-03-23 MED ORDER — GLYCOPYRROLATE 0.2 MG/ML IJ SOLN
INTRAMUSCULAR | Status: AC
  Filled 2015-03-23: qty 1

## 2015-03-23 MED ORDER — PROPOFOL 10 MG/ML IV BOLUS
INTRAVENOUS | Status: DC | PRN
  Administered 2015-03-23: 20 mg via INTRAVENOUS
  Administered 2015-03-23: 50 mg via INTRAVENOUS
  Administered 2015-03-23: 100 mg via INTRAVENOUS
  Administered 2015-03-23: 20 mg via INTRAVENOUS
  Administered 2015-03-23: 30 mg via INTRAVENOUS

## 2015-03-23 MED ORDER — PROPOFOL 10 MG/ML IV BOLUS
INTRAVENOUS | Status: AC
  Filled 2015-03-23: qty 20

## 2015-03-23 MED ORDER — DEXTROSE 5 % IV SOLN
1.5000 g | Freq: Two times a day (BID) | INTRAVENOUS | Status: AC
  Administered 2015-03-23 – 2015-03-24 (×2): 1.5 g via INTRAVENOUS
  Filled 2015-03-23 (×2): qty 1.5

## 2015-03-23 MED ORDER — LACTATED RINGERS IV SOLN
INTRAVENOUS | Status: DC | PRN
  Administered 2015-03-23 (×3): via INTRAVENOUS

## 2015-03-23 MED ORDER — GLYCOPYRROLATE 0.2 MG/ML IJ SOLN
INTRAMUSCULAR | Status: DC | PRN
  Administered 2015-03-23: 0.2 mg via INTRAVENOUS
  Administered 2015-03-23: 0.6 mg via INTRAVENOUS

## 2015-03-23 MED ORDER — LIDOCAINE HCL (CARDIAC) 20 MG/ML IV SOLN
INTRAVENOUS | Status: AC
  Filled 2015-03-23: qty 5

## 2015-03-23 MED ORDER — ONDANSETRON HCL 4 MG/2ML IJ SOLN
INTRAMUSCULAR | Status: AC
  Filled 2015-03-23: qty 2

## 2015-03-23 MED ORDER — GLYCOPYRROLATE 0.2 MG/ML IJ SOLN
INTRAMUSCULAR | Status: AC
  Filled 2015-03-23: qty 5

## 2015-03-23 MED ORDER — LIDOCAINE HCL (CARDIAC) 20 MG/ML IV SOLN
INTRAVENOUS | Status: DC | PRN
  Administered 2015-03-23: 60 mg via INTRAVENOUS

## 2015-03-23 MED ORDER — DEXTROSE 5 % IV SOLN
INTRAVENOUS | Status: DC | PRN
  Administered 2015-03-23: 08:00:00 via INTRAVENOUS

## 2015-03-23 MED ORDER — HEPARIN SODIUM (PORCINE) 1000 UNIT/ML IJ SOLN
INTRAMUSCULAR | Status: DC | PRN
  Administered 2015-03-23: 7000 [IU] via INTRAVENOUS
  Administered 2015-03-23: 3000 [IU] via INTRAVENOUS

## 2015-03-23 MED ORDER — PROTAMINE SULFATE 10 MG/ML IV SOLN
INTRAVENOUS | Status: DC | PRN
  Administered 2015-03-23 (×5): 10 mg via INTRAVENOUS

## 2015-03-23 MED ORDER — ACETAMINOPHEN 650 MG RE SUPP: 325.0000 mg | RECTAL | Status: DC | PRN

## 2015-03-23 MED ORDER — ASPIRIN EC 81 MG PO TBEC
81.0000 mg | DELAYED_RELEASE_TABLET | Freq: Every day | ORAL | Status: DC
  Administered 2015-03-23: 81 mg via ORAL
  Filled 2015-03-23 (×2): qty 1

## 2015-03-23 MED ORDER — CHLORHEXIDINE GLUCONATE CLOTH 2 % EX PADS: 6.0000 | MEDICATED_PAD | Freq: Once | CUTANEOUS | Status: DC

## 2015-03-23 MED ORDER — SODIUM CHLORIDE 0.9 % IV SOLN: 500.0000 mL | Freq: Once | INTRAVENOUS | Status: AC | PRN

## 2015-03-23 MED ORDER — IODIXANOL 320 MG/ML IV SOLN
INTRAVENOUS | Status: DC | PRN
  Administered 2015-03-23: 90 mL via INTRA_ARTERIAL

## 2015-03-23 MED ORDER — 0.9 % SODIUM CHLORIDE (POUR BTL) OPTIME
TOPICAL | Status: DC | PRN
  Administered 2015-03-23 (×2): 1000 mL

## 2015-03-23 MED ORDER — PHENYLEPHRINE HCL 10 MG/ML IJ SOLN
10.0000 mg | INTRAVENOUS | Status: DC | PRN
  Administered 2015-03-23: 40 ug/min via INTRAVENOUS

## 2015-03-23 MED ORDER — HYDROMORPHONE HCL 1 MG/ML IJ SOLN: 0.2500 mg | INTRAMUSCULAR | Status: DC | PRN

## 2015-03-23 MED ORDER — ALPRAZOLAM 0.25 MG PO TABS
0.2500 mg | ORAL_TABLET | Freq: Three times a day (TID) | ORAL | Status: DC | PRN
  Administered 2015-03-23: 0.25 mg via ORAL
  Filled 2015-03-23: qty 1

## 2015-03-23 MED ORDER — HYDRALAZINE HCL 20 MG/ML IJ SOLN: 5.0000 mg | INTRAMUSCULAR | Status: DC | PRN

## 2015-03-23 MED ORDER — GUAIFENESIN-DM 100-10 MG/5ML PO SYRP: 15.0000 mL | ORAL_SOLUTION | ORAL | Status: DC | PRN

## 2015-03-23 MED ORDER — DOCUSATE SODIUM 100 MG PO CAPS
100.0000 mg | ORAL_CAPSULE | Freq: Every day | ORAL | Status: DC
  Filled 2015-03-23: qty 1

## 2015-03-23 MED ORDER — ALBUMIN HUMAN 5 % IV SOLN
INTRAVENOUS | Status: DC | PRN
  Administered 2015-03-23: 10:00:00 via INTRAVENOUS

## 2015-03-23 MED ORDER — DEXAMETHASONE SODIUM PHOSPHATE 4 MG/ML IJ SOLN
INTRAMUSCULAR | Status: DC | PRN
  Administered 2015-03-23: 8 mg via INTRAVENOUS

## 2015-03-23 MED ORDER — LABETALOL HCL 5 MG/ML IV SOLN: 10.0000 mg | INTRAVENOUS | Status: DC | PRN

## 2015-03-23 MED ORDER — DEXAMETHASONE SODIUM PHOSPHATE 4 MG/ML IJ SOLN
INTRAMUSCULAR | Status: AC
Start: 2015-03-23 — End: 2015-03-23
  Filled 2015-03-23: qty 2

## 2015-03-23 MED ORDER — MORPHINE SULFATE 2 MG/ML IJ SOLN: 2.0000 mg | INTRAMUSCULAR | Status: DC | PRN

## 2015-03-23 MED ORDER — SODIUM CHLORIDE 0.9 % IV SOLN
INTRAVENOUS | Status: DC
Start: 2015-03-23 — End: 2015-03-23

## 2015-03-23 MED ORDER — MIDAZOLAM HCL 10 MG/2ML IJ SOLN
INTRAMUSCULAR | Status: AC
  Filled 2015-03-23: qty 2

## 2015-03-23 MED ORDER — SODIUM CHLORIDE 0.45 % IV SOLN
INTRAVENOUS | Status: DC
  Administered 2015-03-23: 17:00:00 via INTRAVENOUS

## 2015-03-23 MED ORDER — SODIUM CHLORIDE 0.9 % IV SOLN: INTRAVENOUS | Status: DC

## 2015-03-23 MED ORDER — NICOTINE 14 MG/24HR TD PT24
14.0000 mg | MEDICATED_PATCH | Freq: Every day | TRANSDERMAL | Status: DC
  Administered 2015-03-23: 14 mg via TRANSDERMAL
  Filled 2015-03-23 (×2): qty 1

## 2015-03-23 MED ORDER — METOPROLOL SUCCINATE ER 25 MG PO TB24
25.0000 mg | ORAL_TABLET | Freq: Every day | ORAL | Status: DC
  Administered 2015-03-23: 25 mg via ORAL
  Filled 2015-03-23 (×2): qty 1

## 2015-03-23 MED ORDER — ARTIFICIAL TEARS OP OINT
TOPICAL_OINTMENT | OPHTHALMIC | Status: AC
  Filled 2015-03-23: qty 3.5

## 2015-03-23 MED ORDER — PROTAMINE SULFATE 10 MG/ML IV SOLN
INTRAVENOUS | Status: AC
  Filled 2015-03-23: qty 5

## 2015-03-23 MED ORDER — MAGNESIUM SULFATE 2 GM/50ML IV SOLN: 2.0000 g | Freq: Every day | INTRAVENOUS | Status: DC | PRN

## 2015-03-23 MED ORDER — ROCURONIUM BROMIDE 100 MG/10ML IV SOLN
INTRAVENOUS | Status: DC | PRN
  Administered 2015-03-23: 50 mg via INTRAVENOUS
  Administered 2015-03-23: 10 mg via INTRAVENOUS
  Administered 2015-03-23: 20 mg via INTRAVENOUS
  Administered 2015-03-23 (×2): 10 mg via INTRAVENOUS

## 2015-03-23 MED ORDER — PHENOL 1.4 % MT LIQD: 1.0000 | OROMUCOSAL | Status: DC | PRN

## 2015-03-23 MED ORDER — ONDANSETRON HCL 4 MG/2ML IJ SOLN: 4.0000 mg | Freq: Four times a day (QID) | INTRAMUSCULAR | Status: DC | PRN

## 2015-03-23 MED ORDER — ALUM & MAG HYDROXIDE-SIMETH 200-200-20 MG/5ML PO SUSP: 15.0000 mL | ORAL | Status: DC | PRN

## 2015-03-23 MED ORDER — POTASSIUM CHLORIDE CRYS ER 20 MEQ PO TBCR: 20.0000 meq | EXTENDED_RELEASE_TABLET | Freq: Every day | ORAL | Status: DC | PRN

## 2015-03-23 MED ORDER — PANTOPRAZOLE SODIUM 40 MG PO TBEC
40.0000 mg | DELAYED_RELEASE_TABLET | Freq: Every day | ORAL | Status: DC
  Administered 2015-03-23: 40 mg via ORAL
  Filled 2015-03-23: qty 1

## 2015-03-23 MED ORDER — SODIUM CHLORIDE 0.9 % IR SOLN
Status: DC | PRN
  Administered 2015-03-23 (×2)

## 2015-03-23 MED ORDER — ONDANSETRON HCL 4 MG/2ML IJ SOLN
INTRAMUSCULAR | Status: DC | PRN
  Administered 2015-03-23: 4 mg via INTRAVENOUS

## 2015-03-23 MED ORDER — ACETAMINOPHEN 325 MG PO TABS
325.0000 mg | ORAL_TABLET | ORAL | Status: DC | PRN
Start: 2015-03-23 — End: 2015-03-24

## 2015-03-23 MED ORDER — INSULIN ASPART 100 UNIT/ML ~~LOC~~ SOLN
0.0000 [IU] | SUBCUTANEOUS | Status: DC
  Administered 2015-03-23: 2 [IU] via SUBCUTANEOUS
  Administered 2015-03-23: 5 [IU] via SUBCUTANEOUS
  Administered 2015-03-23 – 2015-03-24 (×2): 2 [IU] via SUBCUTANEOUS

## 2015-03-23 MED ORDER — RAMIPRIL 5 MG PO CAPS
5.0000 mg | ORAL_CAPSULE | Freq: Every day | ORAL | Status: DC
  Administered 2015-03-23: 5 mg via ORAL
  Filled 2015-03-23 (×2): qty 1

## 2015-03-23 SURGICAL SUPPLY — 82 items
ABBOTT ARMADA 35 PTA CATHETER ×1 IMPLANT
APL SKNCLS STERI-STRIP NONHPOA (GAUZE/BANDAGES/DRESSINGS) ×2
BAG SNAP BAND KOVER 36X36 (MISCELLANEOUS) ×1 IMPLANT
BALLN ARMADA 4X40X80 (BALLOONS) ×3
BALLOON ARMADA 4X40X80 (BALLOONS) IMPLANT
BANDAGE ESMARK 6X9 LF (GAUZE/BANDAGES/DRESSINGS) IMPLANT
BENZOIN TINCTURE PRP APPL 2/3 (GAUZE/BANDAGES/DRESSINGS) ×3 IMPLANT
BNDG CMPR 9X6 STRL LF SNTH (GAUZE/BANDAGES/DRESSINGS)
BNDG ESMARK 6X9 LF (GAUZE/BANDAGES/DRESSINGS)
CANISTER SUCTION 2500CC (MISCELLANEOUS) ×3 IMPLANT
CANNULA VESSEL 3MM 2 BLNT TIP (CANNULA) ×4 IMPLANT
CATH EMB 4FR 80CM (CATHETERS) ×1 IMPLANT
CATH SOFT-VU 4F 65 STRAIGHT (CATHETERS) IMPLANT
CATH SOFT-VU STRAIGHT 4F 65CM (CATHETERS) ×3
CLIP LIGATING EXTRA MED SLVR (CLIP) ×3 IMPLANT
CLIP LIGATING EXTRA SM BLUE (MISCELLANEOUS) ×3 IMPLANT
CLSR STERI-STRIP ANTIMIC 1/2X4 (GAUZE/BANDAGES/DRESSINGS) ×1 IMPLANT
COVER BACK TABLE 60X90IN (DRAPES) ×1 IMPLANT
CUFF TOURNIQUET SINGLE 18IN (TOURNIQUET CUFF) IMPLANT
CUFF TOURNIQUET SINGLE 24IN (TOURNIQUET CUFF) IMPLANT
CUFF TOURNIQUET SINGLE 34IN LL (TOURNIQUET CUFF) IMPLANT
CUFF TOURNIQUET SINGLE 44IN (TOURNIQUET CUFF) IMPLANT
DEVICE TORQUE KENDALL .025-038 (MISCELLANEOUS) ×1 IMPLANT
DRAIN SNY 10X20 3/4 PERF (WOUND CARE) IMPLANT
DRAPE INCISE IOBAN 66X45 STRL (DRAPES) ×1 IMPLANT
DRAPE X-RAY CASS 24X20 (DRAPES) IMPLANT
DRSG COVADERM 4X6 (GAUZE/BANDAGES/DRESSINGS) ×1 IMPLANT
DRSG COVADERM 4X8 (GAUZE/BANDAGES/DRESSINGS) IMPLANT
ELECT REM PT RETURN 9FT ADLT (ELECTROSURGICAL) ×6
ELECTRODE REM PT RTRN 9FT ADLT (ELECTROSURGICAL) ×2 IMPLANT
EVACUATOR SILICONE 100CC (DRAIN) IMPLANT
GAUZE SPONGE 2X2 8PLY STRL LF (GAUZE/BANDAGES/DRESSINGS) IMPLANT
GLOVE BIO SURGEON STRL SZ 6.5 (GLOVE) ×3 IMPLANT
GLOVE BIOGEL PI IND STRL 6.5 (GLOVE) IMPLANT
GLOVE BIOGEL PI IND STRL 7.0 (GLOVE) IMPLANT
GLOVE BIOGEL PI INDICATOR 6.5 (GLOVE) ×4
GLOVE BIOGEL PI INDICATOR 7.0 (GLOVE) ×1
GLOVE ECLIPSE 6.5 STRL STRAW (GLOVE) ×1 IMPLANT
GLOVE SS BIOGEL STRL SZ 7.5 (GLOVE) ×2 IMPLANT
GLOVE SUPERSENSE BIOGEL SZ 7.5 (GLOVE) ×3
GOWN L4 XLG 20 PK N/S (GOWN DISPOSABLE) ×1 IMPLANT
GOWN STRL REUS W/ TWL LRG LVL3 (GOWN DISPOSABLE) ×6 IMPLANT
GOWN STRL REUS W/TWL LRG LVL3 (GOWN DISPOSABLE) ×24
GUIDEWIRE ANGLED .035X150CM (WIRE) ×1 IMPLANT
KIT BASIN OR (CUSTOM PROCEDURE TRAY) ×3 IMPLANT
KIT ROOM TURNOVER OR (KITS) ×3 IMPLANT
LOOP VESSEL MAXI BLUE (MISCELLANEOUS) ×1 IMPLANT
LOOP VESSEL MINI RED (MISCELLANEOUS) ×1 IMPLANT
NDL PERC 18GX7CM (NEEDLE) IMPLANT
NEEDLE PERC 18GX7CM (NEEDLE) ×3 IMPLANT
NS IRRIG 1000ML POUR BTL (IV SOLUTION) ×6 IMPLANT
PACK PERIPHERAL VASCULAR (CUSTOM PROCEDURE TRAY) ×3 IMPLANT
PAD ARMBOARD 7.5X6 YLW CONV (MISCELLANEOUS) ×6 IMPLANT
PADDING CAST COTTON 6X4 STRL (CAST SUPPLIES) IMPLANT
PATCH HEMASHIELD 8X150 (Vascular Products) ×1 IMPLANT
PROTECTION STATION PRESSURIZED (MISCELLANEOUS) ×3
SET COLLECT BLD 21X3/4 12 (NEEDLE) IMPLANT
SHEATH BRITE TIP 7FR 35CM (SHEATH) ×2 IMPLANT
SPONGE GAUZE 2X2 STER 10/PKG (GAUZE/BANDAGES/DRESSINGS) ×1
STAPLER VISISTAT 35W (STAPLE) IMPLANT
STATION PROTECTION PRESSURIZED (MISCELLANEOUS) IMPLANT
STENT GENESIS OPTA 7X39X80 (Permanent Stent) ×2 IMPLANT
STENT PROTEGE 7X60X120 (Stent) ×1 IMPLANT
STOPCOCK 4 WAY LG BORE MALE ST (IV SETS) IMPLANT
STRIP CLOSURE SKIN 1/2X4 (GAUZE/BANDAGES/DRESSINGS) ×3 IMPLANT
SUT ETHILON 3 0 PS 1 (SUTURE) IMPLANT
SUT PROLENE 5 0 C 1 24 (SUTURE) ×1 IMPLANT
SUT PROLENE 6 0 CC (SUTURE) ×5 IMPLANT
SUT VIC AB 2-0 CTX 36 (SUTURE) ×4 IMPLANT
SUT VIC AB 3-0 SH 27 (SUTURE) ×6
SUT VIC AB 3-0 SH 27X BRD (SUTURE) ×2 IMPLANT
SYR 20CC LL (SYRINGE) ×1 IMPLANT
SYR 3ML LL SCALE MARK (SYRINGE) ×1 IMPLANT
SYRINGE 10CC LL (SYRINGE) ×2 IMPLANT
TAPE CLOTH SURG 4X10 WHT LF (GAUZE/BANDAGES/DRESSINGS) ×1 IMPLANT
TRAY FOLEY CATH 16FRSI W/METER (SET/KITS/TRAYS/PACK) ×3 IMPLANT
TUBING EXTENTION W/L.L. (IV SETS) IMPLANT
UNDERPAD 30X30 INCONTINENT (UNDERPADS AND DIAPERS) ×2 IMPLANT
WATER STERILE IRR 1000ML POUR (IV SOLUTION) ×3 IMPLANT
WIRE AMPLATZ SS-J .035X180CM (WIRE) ×1 IMPLANT
WIRE BENTSON .035X145CM (WIRE) ×3 IMPLANT
WIRE HI TORQ VERSACORE J 260CM (WIRE) ×1 IMPLANT

## 2015-03-23 NOTE — Anesthesia Postprocedure Evaluation (Signed)
  Anesthesia Post-op Note  Patient: [name]  Procedure(s) Performed: Procedure(s): ENDARTERECTOMY OF BILATERAL COMMON FEMORAL AND PROFUNDA ARTERIES (Bilateral) LEFT EXTERNAL ILIAC AND BILATERAL COMMON ILIAC STENT (Bilateral) PATCH ANGIOPLASTY OF BILATERAL COMMON FEMORAL ARTERIES (Bilateral)  Patient Location: PACU  Anesthesia Type:General  Level of Consciousness: awake and alert   Airway and Oxygen Therapy: Patient Spontanous Breathing  Post-op Pain: none  Post-op Assessment: Post-op Vital signs reviewed, Patient's Cardiovascular Status Stable and Respiratory Function Stable  Post-op Vital Signs: Reviewed  Filed Vitals:   03/23/15 1400  BP:   Pulse: 64  Temp: 36.6 C  Resp: 16    Complications: No apparent anesthesia complications

## 2015-03-23 NOTE — Progress Notes (Signed)
Pt admitted from PACU, pt oriented to room, nursing will cont to monitor

## 2015-03-23 NOTE — Progress Notes (Signed)
[facility]  No complaints.   Brisk doppler flow to feet bilaterally.  Bilateral groin dressings clean.  S/p #1 bilateral external iliac common femoral and profundus femoral endarterectomy and Dacron patch angioplasty #2 aortogram with left common iliac and left external iliac stenting and right external iliac stenting  Stable post-operatively. Nicotine patch and prn xanax for anxiety. D/c foley in am and mobilize.  Virgina Jock, PA-C Vascular Surgery

## 2015-03-23 NOTE — H&P (View-Only) (Signed)
Patient name: [name] MRN: [id number] DOB: 1935-09-13 Sex: male   Referred by: Brigitte Pulse  Reason for referral:  Chief Complaint  Patient presents with  . Re-evaluation    worsening leg pain    HISTORY OF PRESENT ILLNESS: Patient resents today for continued discussion of his lower extremity arterial insufficiency. He is here today with his wife and his son. He has a very complex past history. Also has a small infrarenal abdominal aortic aneurysm which is been followed with serial exams. He has multiple causes for his lower extremity pain. He certainly does have severe arterial insufficiency. Also has lumbar disc disease and some arthritic changes. He reports that his claudication symptoms have dramatically progress. Is unable to walk more than half a block without severe pain. Prior to this was quite active. He does have some resting symptoms as well. On sitting on the exam table he reports that he does have some low back pain that extends down into his calves. This is significant but is not as limiting as his exercise pain.  Past Medical History  Diagnosis Date  . Cataract   . Diabetes mellitus   . Hyperlipidemia   . Hypertension   . Helicobacter pylori gastritis 01/2006, 03/2011    EGD - Pylera Tx 2010  . CAD (coronary artery disease)   . PAD (peripheral artery disease)   . Hiccups     Past Surgical History  Procedure Laterality Date  . Total knee arthroplasty  07-13-2012    right  . Hernia repair  12/24/2010  . Cataract extraction      right  . Colonoscopy  2002, 2007, 06/09/2011    Dr. Evelina Bucy - [name], [place]: for FHx colon cancer, no adenomas; 2012: [facility] - normal  . Esophagogastroduodenoscopy  02/20/06    Dr. Evelina Bucy, [name], [place]. pylori gastritis and GERD changes (pathology)  . Upper gastrointestinal endoscopy  03/09/2009    Dr. Carlean Purl: H. pylori gastritis (Pylera Tx) and 2 cm hiatal hernia  . Coronary artery bypass graft  05-07-2008  . Joint replacement     right knee replacement    History   Social History  . Marital Status: Married    Spouse Name: N/A  . Number of Children: 2  . Years of Education: N/A   Occupational History  . Retired    Social History Main Topics  . Smoking status: Current Every Day Smoker -- 0.25 packs/day for 40 years    Types: Cigarettes  . Smokeless tobacco: Never Used  . Alcohol Use: No  . Drug Use: No  . Sexual Activity: Not on file   Other Topics Concern  . Not on file   Social History Narrative    Family History  Problem Relation Age of Onset  . Colon cancer Neg Hx   . Heart disease Father   . Diabetes Father   . Heart attack Father   . Hypertension Father   . Varicose Veins Father   . Cancer Sister     colon  . Diabetes Sister   . Other Sister     varicose veins  . Heart attack Sister   . Heart disease Sister   . Hyperlipidemia Sister   . Hypertension Sister   . Varicose Veins Sister   . Cancer Brother     prostate  . Diabetes Brother   . Heart disease Brother   . Hyperlipidemia Brother   . Hypertension Brother   . Other Brother  varicose veins  . Heart attack Brother   . Varicose Veins Brother   . Heart disease Daughter   . Hyperlipidemia Daughter   . Hypertension Daughter   . Cancer Sister     lukemia    Allergies as of 03/10/2015 - Review Complete 03/10/2015  Allergen Reaction Noted  . Sulfonamide derivatives  03/12/2009    Current Outpatient Prescriptions on File Prior to Visit  Medication Sig Dispense Refill  . aspirin 81 MG tablet Take 81 mg by mouth daily.      . Coenzyme Q10 (CO Q-10 PO) Take 300 mg by mouth 2 (two) times a week. Take on Mon and Fri.    . metFORMIN (GLUCOPHAGE) 1000 MG tablet Take 1 tablet by mouth Daily.     . metoprolol succinate (TOPROL-XL) 25 MG 24 hr tablet Take 25 mg by mouth daily.    . pantoprazole (PROTONIX) 40 MG tablet Take 1 tablet (40 mg total) by mouth daily. 30 tablet 6  . ramipril (ALTACE) 5 MG capsule Take 1 tablet by  mouth Daily.     . chlorproMAZINE (THORAZINE) 25 MG tablet Take 0.5 tablets (12.5 mg total) by mouth 3 (three) times daily as needed for hiccoughs. (Patient not taking: Reported on 03/10/2015) 20 tablet 1  . ondansetron (ZOFRAN) 4 MG tablet Take 1 tablet (4 mg total) by mouth every 8 (eight) hours as needed for nausea or vomiting. (Patient not taking: Reported on 03/10/2015) 20 tablet 0   No current facility-administered medications on file prior to visit.        PHYSICAL EXAMINATION:  General: The patient is a well-nourished male, in no acute distress. Vital signs are BP 136/70 mmHg  Pulse 64  Resp 18  Ht 6\' 2"  (1.88 m)  Wt 180 lb 6.4 oz (81.829 kg)  BMI 23.15 kg/m2 Pulmonary: There is a good air exchange  Abdomen: Soft and non-tender with no palpable aneurysm Musculoskeletal: There are no major deformities.  There is no significant extremity pain. Neurologic: No focal weakness or paresthesias are detected, Skin: There are no ulcer or rashes noted. Psychiatric: Alert and oriented Cardiovascular: 2+ brachial and radial pulses. No palpable femoral or distal pulses.   [facility] Vascular Lab Studies:  Ordered and Independently Reviewed this does show monophasic waveforms both lower extremity. There has been some drop in his ankle arm index to 0.7 on the right and 0.65 on the left. He does have severe extreme calcification taking these measurements somewhat falsely elevated  I reviewed his CT scan for evaluation of weight loss. This was from April 2016. He does have a 4 cm aneurysm. He has extreme calcification of his aorta and iliac arteries. This was a noncontrast study. It did show what appears to be totally or near total occlusion of his left and severe stenosis of his right common iliac artery based on calcification Impression and Plan:  Had long discussion with the patient and his family present. I do not feel that he has limb threatening ischemia currently. He is very uncomfortable  regarding his walking ability. I explained the only option would be arteriography for further evaluation to determine what would be required for improved arterial flow. I suspect that his main issue is aortoiliac occlusive disease and also superficial femoral artery occlusive disease. I do not feel that he is a great candidate for open aortobifemoral bypass. This would be his best anatomic option all likelihood. Could be a candidate for axillofemoral femorofemoral bypass. We'll proceed with arteriography for further  evaluation and make recommendations. I explained that he may require brachial approach for arteriogram due to his severe iliac occlusive disease.    Curt Jews [facility] [place] Office: [phone]

## 2015-03-23 NOTE — Transfer of Care (Signed)
Immediate Anesthesia Transfer of Care Note  Patient: [name]  Procedure(s) Performed: Procedure(s): ENDARTERECTOMY OF BILATERAL COMMON FEMORAL AND PROFUNDA ARTERIES (Bilateral) LEFT EXTERNAL ILIAC AND BILATERAL COMMON ILIAC STENT (Bilateral) PATCH ANGIOPLASTY OF BILATERAL COMMON FEMORAL ARTERIES (Bilateral)  Patient Location: PACU  Anesthesia Type:General  Level of Consciousness: awake and patient cooperative  Airway & Oxygen Therapy: Patient Spontanous Breathing and Patient connected to nasal cannula oxygen  Post-op Assessment: Report given to RN, Post -op Vital signs reviewed and stable and Patient moving all extremities  Post vital signs: Reviewed and stable  Last Vitals:  Filed Vitals:   03/23/15 0602  BP: 115/54  Pulse: 71  Temp: 36.6 C  Resp: 18    Complications: No apparent anesthesia complications

## 2015-03-23 NOTE — Anesthesia Procedure Notes (Signed)
Procedure Name: Intubation Date/Time: 03/23/2015 7:42 AM Performed by: Terrill Mohr Pre-anesthesia Checklist: Patient identified, Emergency Drugs available, Suction available and Patient being monitored Patient Re-evaluated:Patient Re-evaluated prior to inductionOxygen Delivery Method: Circle system utilized Preoxygenation: Pre-oxygenation with 100% oxygen Intubation Type: IV induction Ventilation: Mask ventilation without difficulty Laryngoscope Size: Mac and 4 Grade View: Grade II Tube type: Oral Tube size: 7.5 mm Number of attempts: 1 Airway Equipment and Method: Stylet Placement Confirmation: ETT inserted through vocal cords under direct vision,  positive ETCO2 and breath sounds checked- equal and bilateral Secured at: 22 (cm at teeth) cm Tube secured with: Tape Dental Injury: Teeth and Oropharynx as per pre-operative assessment

## 2015-03-23 NOTE — Interval H&P Note (Signed)
History and Physical Interval Note:  03/23/2015 7:08 AM  [name]  has presented today for surgery, with the diagnosis of Peripheral vascular disease with bilateral lower extremity claudication I70.213  The various methods of treatment have been discussed with the patient and family. After consideration of risks, benefits and other options for treatment, the patient has consented to  Procedure(s): ENDARTERECTOMY FEMORAL (Bilateral) INSERTION OF ILIAC STENT (Bilateral) as a surgical intervention .  The patient's history has been reviewed, patient examined, no change in status, stable for surgery.  I have reviewed the patient's chart and labs.  Questions were answered to the patient's satisfaction.     Curt Jews

## 2015-03-23 NOTE — Op Note (Signed)
OPERATIVE REPORT  DATE OF SURGERY: 03/23/2015  PATIENT: [name], 80 y.o. male MRN: [id number]  DOB: 1935/10/06  PRE-OPERATIVE DIAGNOSIS: Bilateral lower extremity arterial insufficiency  POST-OPERATIVE DIAGNOSIS:  Same  PROCEDURE: #1 bilateral external iliac common femoral and profundus femoral endarterectomy and Dacron patch angioplasty #2 aortogram with left common iliac and left external iliac stenting and right external iliac stenting  SURGEON:  Curt Jews, M.D.  PHYSICIAN ASSISTANT: Trinh  ANESTHESIA:  Gen.  EBL: 100 ml  Total I/O In: 2900 [I.V.:2650; IV Piggyback:250] Out: 785 [Urine:635; Blood:150]  BLOOD ADMINISTERED: None  DRAINS: None  SPECIMEN: None  COUNTS CORRECT:  YES  PLAN OF CARE: PACU   PATIENT DISPOSITION:  PACU - hemodynamically stable  PROCEDURE DETAILS: The patient was taken to the operating room placed supine position where the area the abdomen and both groins were prepped and draped in usual sterile fashion. Incisions were made over both common femoral arteries and carried down to isolate the common femoral superficial femoral and profundus femoris arteries bilaterally. The vessels were encircled with Vesseloops. Tributary branches were controlled with 2-0 silk ties. Dissection was continued up under the inguinal ligament bilaterally to expose the external iliac artery up under the inguinal ligament. This was encircled with a blue vessel loop. Attention was taken down onto the profundus femoris arteries bilaterally into several branch points. Superficial femoral arteries were chronically occluded at the origin. After adequate exposure, the cement C-arm was brought to the field. The right common femoral artery was punctured with an 18-gauge needle a guidewire was passed up to the level of the aorta. A 7 French sheath was positioned at the level of the external iliac artery. This could not be advanced due to severe stenosis at the external iliac  just after the hypogastric takeoff. Next the left common femoral artery was punctured with an 18-gauge needle a guidewire was passed centrally. This would not go past the external iliac level. A 7 French sheath was passed over the guidewire and positioned below this level. A angled Glidewire with the torque device was used to manipulate the guidewire to the left side up to the level of the aorta. No catheter was used to exchanged the Glidewire for a stiff wire. Patient was given 7000 units of intravenous heparin. The treatment of the right side was addressed first. A 4 mm x 4 cm balloon was used to predilate the area of high-grade stenosis in the lower of the sheath to be positioned through this portion of the artery. Hand injections through the right femoral sheath which was positioned the level of aortic bifurcation revealed irregularity but no flow-limiting stenosis in the common femoral artery. There was a high-grade stenosis just past the level of the takeoff of the hypogastric artery. This was over a relatively long segment. A balloon expandable 7 mm x 39 mm stent was chosen. This was positioned at the appropriate level just below the takeoff of the hypogastric artery. The sheath was withdrawn this was inflated. Repeat hand injection showed excellent result.  Attention was then turned to the left iliac segments. The 7 French sheath would not pass beyond the level of high-grade stenosis in the external iliac artery. This area was again predrill dilated with a 4 mm to allow the sheath passed through this area. The common iliac just below the aortic bifurcation was also predilated. The sheath was brought to this level. Hand injection through the right femoral sheath which was positioned just above the bifurcation  showed the level of the stenosis. A 7 mm x 39 mm balloon expandable stent was chosen here as well. This was positioned just below the aortic bifurcation and the common iliac artery and was inflated. An  injection showed excellent result of this high-grade stenosis. There was repleted occlusion by the sheath of the external iliac lesion. The sheath was withdrawn below this level. There was a high-grade stenosis in the mid external iliac artery and another area of stenosis just above this. Able a self-expanding 7 mm x 60 mm stent was chosen for the left external iliac. This was deployed and hand injection showed very nice result with oh residual stenosis. The right groin again was exposed and the right femoral sheath was removed. Henley clamp was not able to control the external iliac on the right due to severe calcification. Likewise of pulling up on the blue vessel loop which Potts did not give adequate control. For this reason the 4 Fogarty with the stopcock was positioned into the external iliac was inflated to give good inflow control. The common femoral artery was opened up the echo artery under the anal ligament and was continued down onto the origin of the superficial femoral artery. Superficial artery was chronically occluded. For this reason was ligated and divided. The endarterectomy was begun on the common femoral artery was extended up with fevers and endarterectomy in the external iliac artery the endarterectomy was continued down into the deep femoral artery. The endpoint was nice and was tacked with interrupted 6-0 Prolene suture. A fenestra Hemashield Dacron patch was brought to the field was sewn as a patch angioplasty with a running 6-0 Prolene suture. This began on the external iliac artery extended down onto the endarterectomized segment of the transected superficial femoral artery. Through the usual flushing maneuvers anastomosis was completed and flow was assured the right leg. Next similar exposure on the left groin was obtained. The 7 French sheath was removed and the external iliac artery was occluded with a Henley clamp. The profunda branches were controlled with Vesseloops. The superficial  femoral artery on the left was chronically occluded and was ligated and divided. The endarterectomy was begun on the external iliac and extended down into the deep femoral artery. There were several branches of the deep femoral artery and the plaque again was tacked distally on the superficial femoral artery. The Hemashield fenestra Dacron patch again was brought onto the field and was sewn as a patch angioplasty over the femoral endarterectomy. Are to completion of the closure the clamps were removed for the usual flushing maneuvers anastomosis was completed. The patient was given 50 mg of protamine to reverse heparin. Wounds irrigated with saline. Hemostasis tablet cautery. Wounds were closed with several layers of 2-0 Vicryl in subcutaneous tissue. Skin was closed with 3-0 subcuticular Vicryl sutures. Benzoin and Steri-Strips were applied. Sterile dressing was applied the patient was transferred to the recovery room in stable condition   Curt Jews, M.D. 03/23/2015 1:49 PM

## 2015-03-23 NOTE — Anesthesia Preprocedure Evaluation (Addendum)
Anesthesia Evaluation  Patient identified by MRN, date of birth, ID band Patient awake    Reviewed: Allergy & Precautions, H&P , NPO status , Patient's Chart, lab work & pertinent test results, reviewed documented beta blocker date and time   Airway Mallampati: II  TM Distance: >3 FB Neck ROM: Full    Dental no notable dental hx. (+) Teeth Intact, Dental Advisory Given, Partial Upper, Partial Lower   Pulmonary Current Smoker,  breath sounds clear to auscultation  Pulmonary exam normal       Cardiovascular hypertension, Pt. on medications and Pt. on home beta blockers + CAD, + CABG and + Peripheral Vascular Disease Rhythm:Regular Rate:Normal     Neuro/Psych negative neurological ROS  negative psych ROS   GI/Hepatic Neg liver ROS, GERD-  Medicated and Controlled,  Endo/Other  diabetes, Well Controlled, Type 2, Oral Hypoglycemic Agents  Renal/GU negative Renal ROS  negative genitourinary   Musculoskeletal  (+) Arthritis -, Osteoarthritis,    Abdominal   Peds  Hematology negative hematology ROS (+)   Anesthesia Other Findings   Reproductive/Obstetrics negative OB ROS                         Anesthesia Physical Anesthesia Plan  ASA: III  Anesthesia Plan: General   Post-op Pain Management:    Induction: Intravenous  Airway Management Planned: Oral ETT  Additional Equipment:   Intra-op Plan:   Post-operative Plan: Extubation in OR  Informed Consent: I have reviewed the patients History and Physical, chart, labs and discussed the procedure including the risks, benefits and alternatives for the proposed anesthesia with the patient or authorized representative who has indicated his/her understanding and acceptance.   Dental advisory given  Plan Discussed with: CRNA  Anesthesia Plan Comments:         Anesthesia Quick Evaluation

## 2015-03-24 ENCOUNTER — Encounter (HOSPITAL_COMMUNITY): Payer: Self-pay | Admitting: Vascular Surgery

## 2015-03-24 LAB — CBC
HEMATOCRIT: 36 % — AB (ref 39.0–52.0)
Hemoglobin: 12.3 g/dL — ABNORMAL LOW (ref 13.0–17.0)
MCH: 29.4 pg (ref 26.0–34.0)
MCHC: 34.2 g/dL (ref 30.0–36.0)
MCV: 85.9 fL (ref 78.0–100.0)
Platelets: 175 10*3/uL (ref 150–400)
RBC: 4.19 MIL/uL — ABNORMAL LOW (ref 4.22–5.81)
RDW: 13.1 % (ref 11.5–15.5)
WBC: 10.2 10*3/uL (ref 4.0–10.5)

## 2015-03-24 LAB — BASIC METABOLIC PANEL
ANION GAP: 7 (ref 5–15)
BUN: 11 mg/dL (ref 6–20)
CALCIUM: 9.1 mg/dL (ref 8.9–10.3)
CO2: 26 mmol/L (ref 22–32)
CREATININE: 0.74 mg/dL (ref 0.61–1.24)
Chloride: 97 mmol/L — ABNORMAL LOW (ref 101–111)
GLUCOSE: 122 mg/dL — AB (ref 65–99)
POTASSIUM: 4.3 mmol/L (ref 3.5–5.1)
Sodium: 130 mmol/L — ABNORMAL LOW (ref 135–145)

## 2015-03-24 LAB — GLUCOSE, CAPILLARY
GLUCOSE-CAPILLARY: 139 mg/dL — AB (ref 65–99)
Glucose-Capillary: 152 mg/dL — ABNORMAL HIGH (ref 65–99)

## 2015-03-24 MED ORDER — INSULIN ASPART 100 UNIT/ML ~~LOC~~ SOLN: 0.0000 [IU] | Freq: Three times a day (TID) | SUBCUTANEOUS | Status: DC

## 2015-03-24 MED ORDER — OXYCODONE-ACETAMINOPHEN 5-325 MG PO TABS: 1.0000 | ORAL_TABLET | ORAL | Status: DC | PRN

## 2015-03-24 NOTE — Progress Notes (Signed)
Physical Therapy Evaluation and DISCHARGE Patient Details Name: [name] MRN: [id number] DOB: 1935-01-23 Today's Date: 03/24/2015   History of Present Illness  Pt is a 80 yo male admitted with pain in LEs when walking.  Pt underwent a B external iliac common femoral and profundus femoral endartarectomy and angioplasty as well as an aortogram with L common iliac stenting and R external iliac stenting.  Pt with h/o R TKR, back pain and CABG.  Clinical Impression  Pt is at or close to baseline functioning and should be safe at home with available family asssit.  Restrictions reinforced. There are no further acute PT needs.  Will sign off at this time.    Follow Up Recommendations No PT follow up    Equipment Recommendations  None recommended by PT    Recommendations for Other Services       Precautions / Restrictions Precautions Precautions: None Restrictions Weight Bearing Restrictions: No      Mobility  Bed Mobility Overal bed mobility:  (pt in chair on arrival.)             General bed mobility comments: Pt in chair.  Transfers Overall transfer level: Modified independent Equipment used: Rolling walker (2 wheeled)             General transfer comment: Pt overall safe with transfers.     Ambulation/Gait Ambulation/Gait assistance: Modified independent (Device/Increase time) Ambulation Distance (Feet): 200 Feet Assistive device: None Gait Pattern/deviations: Step-through pattern Gait velocity: WFL Gait velocity interpretation: at or above normal speed for age/gender General Gait Details: Mild instability from recent inability to move much with resulting weakness.  Stairs Stairs: Yes   Stair Management: One rail Right;Alternating pattern;Forwards Number of Stairs: 10 General stair comments: safe with rails  Wheelchair Mobility    Modified Rankin (Stroke Patients Only)       Balance Overall balance assessment: No apparent balance deficits (not  formally assessed)                                           Pertinent Vitals/Pain Pain Assessment: No/denies pain    Home Living Family/patient expects to be discharged to:: Private residence Living Arrangements: Spouse/significant other Available Help at Discharge: Family;Available 24 hours/day Type of Home: House Home Access: Stairs to enter Entrance Stairs-Rails: None Entrance Stairs-Number of Steps: 3 Home Layout: Two level;Able to live on main level with bedroom/bathroom Home Equipment: Gilford Rile - 2 wheels;Bedside commode Additional Comments: pt needs shower chair.    Prior Function Level of Independence: Independent with assistive device(s)         Comments: uses walker most of the time recently and raised commode in bathroom.     Hand Dominance   Dominant Hand: Right    Extremity/Trunk Assessment   Upper Extremity Assessment: Defer to OT evaluation           Lower Extremity Assessment: Overall WFL for tasks assessed      Cervical / Trunk Assessment: Normal  Communication   Communication: No difficulties  Cognition Arousal/Alertness: Awake/alert Behavior During Therapy: WFL for tasks assessed/performed Overall Cognitive Status: Within Functional Limits for tasks assessed                      General Comments General comments (skin integrity, edema, etc.): Reinforced lifting restrictions and driving restriction    Exercises  Assessment/Plan    PT Assessment Patent does not need any further PT services  PT Diagnosis     PT Problem List    PT Treatment Interventions     PT Goals (Current goals can be found in the Care Plan section) Acute Rehab PT Goals Patient Stated Goal: to go home now. PT Goal Formulation: All assessment and education complete, DC therapy    Frequency     Barriers to discharge        Co-evaluation               End of Session   Activity Tolerance: Patient tolerated treatment  well Patient left: in chair;with call bell/phone within reach;with family/visitor present Nurse Communication: Mobility status         Time: 4982-6415 PT Time Calculation (min) (ACUTE ONLY): 14 min   Charges:   PT Evaluation $Initial PT Evaluation Tier I: 1 Procedure     PT G Codes:        [name], [name] 03/24/2015, 10:07 AM 03/24/2015  [name], [name] [phone]  (pager)

## 2015-03-24 NOTE — Progress Notes (Addendum)
  [facility] Progress Note  03/24/2015 7:59 AM 1 Day Post-Op  Subjective:  No complaints. Ready to go home.   Tmax 98 BP sys 100s-140s 02 97% RA  Filed Vitals:   03/24/15 0408  BP: 101/47  Pulse: 60  Temp:   Resp: 15    Physical Exam: Incisions:  Bilateral groin incisions clean and intact. No hematoma.  Extremities:  +doppler flow to AT and PTs bilaterally  CBC    Component Value Date/Time   WBC 10.2 03/24/2015 0300   RBC 4.19* 03/24/2015 0300   HGB 12.3* 03/24/2015 0300   HCT 36.0* 03/24/2015 0300   PLT 175 03/24/2015 0300   MCV 85.9 03/24/2015 0300   MCH 29.4 03/24/2015 0300   MCHC 34.2 03/24/2015 0300   RDW 13.1 03/24/2015 0300   LYMPHSABS 2.9 12/21/2010 0958   MONOABS 0.7 12/21/2010 0958   EOSABS 0.2 12/21/2010 0958   BASOSABS 0.0 12/21/2010 0958    BMET    Component Value Date/Time   NA 130* 03/24/2015 0300   K 4.3 03/24/2015 0300   CL 97* 03/24/2015 0300   CO2 26 03/24/2015 0300   GLUCOSE 122* 03/24/2015 0300   BUN 11 03/24/2015 0300   CREATININE 0.74 03/24/2015 0300   CALCIUM 9.1 03/24/2015 0300   GFRNONAA >60 03/24/2015 0300   GFRAA >60 03/24/2015 0300    INR    Component Value Date/Time   INR 1.13 03/20/2015 1014     Intake/Output Summary (Last 24 hours) at 03/24/15 0759 Last data filed at 03/24/15 0500  Gross per 24 hour  Intake   3935 ml  Output   2510 ml  Net   1425 ml     Assessment:  80 y.o. male is s/p:  #1 bilateral external iliac common femoral and profundus femoral endarterectomy and Dacron patch angioplasty #2 aortogram with left common iliac and left external iliac stenting and right external iliac stenting 1 Day Post-Op  Plan: -Doing well post-operatively. Incisions healing well and good doppler flow bilaterally. -Voiding adequately. -Continue to ambulate. Plan to d/c home today if ambulating well, tolerating diet and pain well controlled on oral meds.    Virgina Jock, PA-C [facility] Office: [phone] Pager: [phone] 03/24/2015 7:59 AM     I have examined the patient, reviewed and agree with above. Looks great this morning. Has been up walking without difficulty. Both groins without hematoma. Probable home later this morning after ambulation.  Curt Jews, MD 03/24/2015 8:26 AM

## 2015-03-24 NOTE — Progress Notes (Signed)
Pt to d/c home. Reviewed instructions w/Pt & family, they indicated understanding.. When to call Dr, shower only, etc.

## 2015-03-24 NOTE — Evaluation (Signed)
Occupational Therapy Evaluation and Discharge Summary Patient Details Name: [name] MRN: [id number] DOB: 22-Apr-1935 Today's Date: 03/24/2015    History of Present Illness Pt is a 80 yo male admitted with pain in LEs when walking.  Pt underwent a B external iliac common femoral and profundus femoral endartarectomy and angioplasty as well as an aortogram with L common iliac stenting and R external iliac stenting.  Pt with h/o R TKR, back pain and CABG.   Clinical Impression   Pt admitted for above diagnosis and overall is doing well with adls.  Reviewed proper body mechanics with pt as he has a long history of back pain and does not have good body mechanics.  Pt is safe to go home with his wife and no follow up OT at this time.    Follow Up Recommendations  No OT follow up;Supervision - Intermittent    Equipment Recommendations  Tub/shower seat    Recommendations for Other Services       Precautions / Restrictions Precautions Precautions: None Restrictions Weight Bearing Restrictions: No      Mobility Bed Mobility Overal bed mobility:  (pt in chair on arrival.)             General bed mobility comments: Pt in chair.  Transfers Overall transfer level: Modified independent Equipment used: Rolling walker (2 wheeled)             General transfer comment: Pt overall safe with transfers.  Clear that he has "his"way of doing things PTA and will continue with those ways.  Did educated him on hand placement when coming to sit and stand to continue to be safe.    Balance Overall balance assessment: No apparent balance deficits (not formally assessed)                                          ADL Overall ADL's : Modified independent                                       General ADL Comments: PT continues to be able to do all adls given extra time.  Pt has had long time back pain and does not use proper body mechanics during adls and  LE dressing. Reviewed the need to use proper body mechanics on regular basis to help his back but pt did not appear interested in doing things differently since he has his way of completing adls.     Vision Vision Assessment?: No apparent visual deficits   Perception     Praxis      Pertinent Vitals/Pain Pain Assessment: No/denies pain     Hand Dominance Right   Extremity/Trunk Assessment Upper Extremity Assessment Upper Extremity Assessment: Overall WFL for tasks assessed   Lower Extremity Assessment Lower Extremity Assessment: Defer to PT evaluation   Cervical / Trunk Assessment Cervical / Trunk Assessment: Normal   Communication Communication Communication: No difficulties   Cognition Arousal/Alertness: Awake/alert Behavior During Therapy: WFL for tasks assessed/performed Overall Cognitive Status: Within Functional Limits for tasks assessed                     General Comments       Exercises       Shoulder Instructions      Home  Living Family/patient expects to be discharged to:: Private residence Living Arrangements: Spouse/significant other Available Help at Discharge: Family;Available 24 hours/day Type of Home: House Home Access: Stairs to enter [employer] of Steps: 3 Entrance Stairs-Rails: None Home Layout: Two level;Able to live on main level with bedroom/bathroom     Bathroom Shower/Tub: Walk-in shower;Door   [employer] Toilet: Standard     Home Equipment: Environmental consultant - 2 wheels;Bedside commode   Additional Comments: pt needs shower chair.      Prior Functioning/Environment Level of Independence: Independent with assistive device(s)        Comments: uses walker most of the time recently and raised commode in bathroom.    OT Diagnosis:     OT Problem List:     OT Treatment/Interventions:      OT Goals(Current goals can be found in the care plan section) Acute Rehab OT Goals Patient Stated Goal: to go home now.  OT  Frequency:     Barriers to D/C:            Co-evaluation              End of Session Equipment Utilized During Treatment: Rolling walker Nurse Communication: Mobility status  Activity Tolerance: Patient tolerated treatment well Patient left: in chair;with call bell/phone within reach;with family/visitor present   Time: 0840-0906 OT Time Calculation (min): 26 min Charges:  OT General Charges $OT Visit: 1 Procedure OT Evaluation $Initial OT Evaluation Tier I: 1 Procedure OT Treatments $Self Care/Home Management : 8-22 mins G-Codes:    [name] Apr 15, 2015, 9:22 AM [name], OTR/L [phone]

## 2015-03-25 NOTE — Discharge Summary (Signed)
[facility] Discharge Summary  [name] 1935/08/31 80 y.o. male  [id number]  Admission Date: 03/23/2015  Discharge Date: 03/24/2015  Physician: Curt Jews, MD  Admission Diagnosis: Peripheral vascular disease with bilateral lower extremity claudication I70.213  HPI:   This is a 80 y.o. male who presented for continued discussion of his lower extremity arterial insufficiency.  He has a very complex past history. Also has a small infrarenal abdominal aortic aneurysm which is been followed with serial exams. He has multiple causes for his lower extremity pain. He certainly does have severe arterial insufficiency. Also has lumbar disc disease and some arthritic changes. He reports that his claudication symptoms have dramatically progress. Is unable to walk more than half a block without severe pain. Prior to this was quite active. He does have some resting symptoms as well. On sitting on the exam table he reports that he does have some low back pain that extends down into his calves. This is significant but is not as limiting as his exercise pain.  Hospital Course:  The patient was admitted to the hospital and taken to the operating room on 03/23/2015 and underwent: #1 bilateral external iliac common femoral and profundus femoral endarterectomy and Dacron patch angioplasty #2 aortogram with left common iliac and left external iliac stenting and right external iliac stenting  The patient tolerated the procedure well and was transported to the PACU in  stable condition.   He did well post-operatively. On POD 1, he had good doppler flow to his anterior tibial and posterior tibial arteries bilaterally. His bilateral groin incisions were clean and intact without evidence of hematoma. He was ambulating well without assistance and voiding without difficulty. He denied any pain. He was tolerating a regular diet. He was discharged home on POD 1 in good condition.   CBC      Component Value Date/Time   WBC 10.2 03/24/2015 0300   RBC 4.19* 03/24/2015 0300   HGB 12.3* 03/24/2015 0300   HCT 36.0* 03/24/2015 0300   PLT 175 03/24/2015 0300   MCV 85.9 03/24/2015 0300   MCH 29.4 03/24/2015 0300   MCHC 34.2 03/24/2015 0300   RDW 13.1 03/24/2015 0300   LYMPHSABS 2.9 12/21/2010 0958   MONOABS 0.7 12/21/2010 0958   EOSABS 0.2 12/21/2010 0958   BASOSABS 0.0 12/21/2010 0958    BMET    Component Value Date/Time   NA 130* 03/24/2015 0300   K 4.3 03/24/2015 0300   CL 97* 03/24/2015 0300   CO2 26 03/24/2015 0300   GLUCOSE 122* 03/24/2015 0300   BUN 11 03/24/2015 0300   CREATININE 0.74 03/24/2015 0300   CALCIUM 9.1 03/24/2015 0300   GFRNONAA >60 03/24/2015 0300   GFRAA >60 03/24/2015 0300     Discharge Instructions:   The patient is discharged to home with extensive instructions on wound care and progressive ambulation.  They are instructed not to drive or perform any heavy lifting until returning to see the physician in his office.  Discharge Instructions    Call MD for:  redness, tenderness, or signs of infection (pain, swelling, bleeding, redness, odor or green/yellow discharge around incision site)    Complete by:  As directed      Call MD for:  severe or increased pain, loss or decreased feeling  in affected limb(s)    Complete by:  As directed      Call MD for:  temperature >100.5    Complete by:  As directed  Discharge wound care:    Complete by:  As directed   May shower tomorrow. Wash the groin wounds with soap and water daily and pat dry. (No tub bath-only shower)  Then put a dry gauze or washcloth there to keep this area dry daily and as needed.  Do not use Vaseline or neosporin on your incisions.  Only use soap and water on your incisions and then protect and keep dry.     Driving Restrictions    Complete by:  As directed   No driving for 2 weeks     Increase activity slowly    Complete by:  As directed   Walk with assistance use walker  or cane as needed     Lifting restrictions    Complete by:  As directed   No lifting greater than a gallon of milk for 2 weeks     Resume previous diet    Complete by:  As directed            Discharge Diagnosis:  Peripheral vascular disease with bilateral lower extremity claudication I70.213  Secondary Diagnosis: Patient Active Problem List   Diagnosis Date Noted  . Atherosclerosis of native artery of both lower extremities with intermittent claudication 03/23/2015  . Intractable hiccups 02/16/2015  . Dehydration 02/16/2015  . Hyponatremia 02/16/2015  . Atherosclerosis of native arteries of extremity with intermittent claudication 05/27/2014  . Perineal abscess 06/20/2013  . Hypertension   . CAD (coronary artery disease)   . PAD (peripheral artery disease)   . Hyperlipidemia   . Chronic constipation 06/27/2012   Past Medical History  Diagnosis Date  . Cataract   . Diabetes mellitus   . Hyperlipidemia   . Hypertension   . Helicobacter pylori gastritis 01/2006, 03/2011    EGD - Pylera Tx 2010  . CAD (coronary artery disease)   . PAD (peripheral artery disease)   . Hiccups   . History of kidney stones   . GERD (gastroesophageal reflux disease)   . Arthritis        Medication List    TAKE these medications        aspirin 81 MG tablet  Take 81 mg by mouth daily.     chlorproMAZINE 25 MG tablet  Commonly known as:  THORAZINE  Take 0.5 tablets (12.5 mg total) by mouth 3 (three) times daily as needed for hiccoughs.     cilostazol 100 MG tablet  Commonly known as:  PLETAL  Take 100 mg by mouth 2 (two) times daily.     CO Q-10 PO  Take 300 mg by mouth 2 (two) times a week. Take on Mon and Fri.     metFORMIN 1000 MG tablet  Commonly known as:  GLUCOPHAGE  Take 1 tablet by mouth Daily.     metoprolol succinate 25 MG 24 hr tablet  Commonly known as:  TOPROL-XL  Take 25 mg by mouth daily.     ondansetron 4 MG tablet  Commonly known as:  ZOFRAN  Take 1  tablet (4 mg total) by mouth every 8 (eight) hours as needed for nausea or vomiting.     oxyCODONE-acetaminophen 5-325 MG per tablet  Commonly known as:  PERCOCET/ROXICET  Take 1-2 tablets by mouth every 4 (four) hours as needed for moderate pain.     pantoprazole 40 MG tablet  Commonly known as:  PROTONIX  Take 1 tablet (40 mg total) by mouth daily.     ramipril 5 MG capsule  Commonly  known as:  ALTACE  Take 1 tablet by mouth Daily.     traMADol 50 MG tablet  Commonly known as:  ULTRAM  Take 50 mg by mouth 1 day or 1 dose.        Percocet #30 No Refill  Disposition: Home  Patient's condition: is Good  Follow up: 1. Dr. Donnetta Hutching in 2 weeks   Virgina Jock, PA-C [facility] [phone] 03/25/2015  2:34 PM  - For VQI Registry use --- Instructions: Press F2 to tab through selections.  Delete question if not applicable.   Post-op:  Wound infection: No  Graft infection: No  Transfusion: No   New Arrhythmia: No Ipsilateral amputation: No, [ ]  Minor, [ ]  BKA, [ ]  AKA Patency judged by: [x ] Dopper only, [ ]  Palpable graft pulse, [ ]  Palpable distal pulse, [ ]  ABI inc. > 0.15, [ ]  Duplex D/C Ambulatory Status: Ambulatory  Complications: MI: No, [ ]  Troponin only, [ ]  EKG or Clinical CHF: No Resp failure:No, [ ]  Pneumonia, [ ]  Ventilator Chg in renal function: No, [ ]  Inc. Cr > 0.5, [ ]  Temp. Dialysis, [ ]  Permanent dialysis Stroke: No, [ ]  Minor, [ ]  Major Return to OR: No  Reason for return to OR: [ ]  Bleeding, [ ]  Infection, [ ]  Thrombosis, [ ]  Revision  Discharge medications: Statin use:  no ASA use:  yes Plavix use:  no Beta blocker use: yes Coumadin use: no

## 2015-03-27 ENCOUNTER — Telehealth: Payer: Self-pay | Admitting: Vascular Surgery

## 2015-03-27 NOTE — Telephone Encounter (Signed)
Spoke with pt, dpm °

## 2015-03-27 NOTE — Telephone Encounter (Signed)
-----   Message from Mena Goes, RN sent at 03/24/2015  9:40 AM EDT ----- Regarding: Schedule   ----- Message -----    From: Alvia Grove, PA-C    Sent: 03/24/2015   8:04 AM      To: [facility] Charge Pool  #1 bilateral external iliac common femoral and profundus femoral endarterectomy and Dacron patch angioplasty #2 aortogram with left common iliac and left external iliac stenting and right external iliac stenting 03/23/15  F/u with Dr. Donnetta Hutching in 2 weeks  Thanks Maudie Mercury

## 2015-04-01 ENCOUNTER — Telehealth: Payer: Self-pay

## 2015-04-01 ENCOUNTER — Ambulatory Visit (INDEPENDENT_AMBULATORY_CARE_PROVIDER_SITE_OTHER): Payer: Medicare Other | Admitting: Vascular Surgery

## 2015-04-01 ENCOUNTER — Encounter: Payer: Self-pay | Admitting: Vascular Surgery

## 2015-04-01 ENCOUNTER — Ambulatory Visit (HOSPITAL_COMMUNITY)
Admission: RE | Admit: 2015-04-01 | Discharge: 2015-04-01 | Disposition: A | Discharge status: Home / Self Care | Payer: Medicare Other | Source: Ambulatory Visit | Attending: Vascular Surgery | Admitting: Vascular Surgery

## 2015-04-01 VITALS — BP 126/65 | HR 86 | Temp 97.8°F | Resp 16 | Ht 73.0 in | Wt 176.0 lb

## 2015-04-01 DIAGNOSIS — R1909 Other intra-abdominal and pelvic swelling, mass and lump: Secondary | ICD-10-CM

## 2015-04-01 DIAGNOSIS — R609 Edema, unspecified: Secondary | ICD-10-CM | POA: Diagnosis not present

## 2015-04-01 DIAGNOSIS — Z9889 Other specified postprocedural states: Secondary | ICD-10-CM

## 2015-04-01 DIAGNOSIS — R1900 Intra-abdominal and pelvic swelling, mass and lump, unspecified site: Secondary | ICD-10-CM | POA: Diagnosis not present

## 2015-04-01 DIAGNOSIS — I739 Peripheral vascular disease, unspecified: Secondary | ICD-10-CM

## 2015-04-01 NOTE — Telephone Encounter (Signed)
Phone call from pt.  Reported 3 day hx. of increased swelling in bilateral groin.  Described a bulge in right groin, approx. size of an "oblong egg, that feels firm."  Described that the swelling extends across the suprapubic area into the left groin.  Stated there had been redness in bilat. groin, but the color has become less red; stated the area "feels warm."  Stated there is less tenderness at this time.  Denied fever/ chills.  Requested appt. for evaluation today.  Will discuss with Dr. Scot Dock.

## 2015-04-01 NOTE — Progress Notes (Signed)
POST OPERATIVE OFFICE NOTE    CC:  F/u for surgery  HPI:  This is a 80 y.o. male who is s/p bilateral external iliac common femoral and profundus femoral endarterectomy and dacron patch angioplasty with aortogram with left common iliac and left external iliac stenting and right external iliac stenting on 03/23/15.  He states that he has been doing quite well and is walking much better and without difficulty.  He states that he has had some swelling in his right groin across to his left groin over the past several days.  He does not have any pain in his feet.  He comes in today for ultrasound and evaluation.  Allergies  Allergen Reactions  . Sulfonamide Derivatives Hives    Current Outpatient Prescriptions  Medication Sig Dispense Refill  . aspirin 81 MG tablet Take 81 mg by mouth daily.      . chlorproMAZINE (THORAZINE) 25 MG tablet Take 0.5 tablets (12.5 mg total) by mouth 3 (three) times daily as needed for hiccoughs. 20 tablet 1  . cilostazol (PLETAL) 100 MG tablet Take 100 mg by mouth 2 (two) times daily.    . metFORMIN (GLUCOPHAGE) 1000 MG tablet Take 1 tablet by mouth Daily.     . metoprolol succinate (TOPROL-XL) 25 MG 24 hr tablet Take 25 mg by mouth daily.    . ramipril (ALTACE) 5 MG capsule Take 1 tablet by mouth Daily.     . traMADol (ULTRAM) 50 MG tablet Take 50 mg by mouth 1 day or 1 dose.    . Coenzyme Q10 (CO Q-10 PO) Take 300 mg by mouth 2 (two) times a week. Take on Mon and Fri.    . ondansetron (ZOFRAN) 4 MG tablet Take 1 tablet (4 mg total) by mouth every 8 (eight) hours as needed for nausea or vomiting. (Patient not taking: Reported on 04/01/2015) 20 tablet 0  . oxyCODONE-acetaminophen (PERCOCET/ROXICET) 5-325 MG per tablet Take 1-2 tablets by mouth every 4 (four) hours as needed for moderate pain. (Patient not taking: Reported on 04/01/2015) 30 tablet 0  . pantoprazole (PROTONIX) 40 MG tablet Take 1 tablet (40 mg total) by mouth daily. (Patient not taking: Reported on  04/01/2015) 30 tablet 6   No current facility-administered medications for this visit.     ROS:  See HPI  Physical Exam:  Filed Vitals:   04/01/15 1336  BP: 126/65  Pulse: 86  Temp: 97.8 F (36.6 C)  Resp: 16    Incision:  Healing nicely with steri strips in place (several removed today) Extremities:  Bilateral feet are warm with faintly palpable DP pulses bilaterally; he has 2+ palpable bilateral femoral pulses. He does have swelling in the right groin that is hard and non pulsatile.  He has ecchymosis from right to left groin down to his penis.  Assessment/Plan:  This is a 80 y.o. male who is s/p: #1 bilateral external iliac common femoral and profundus femoral endarterectomy and Dacron patch angioplasty #2 aortogram with left common iliac and left external iliac stenting and right external iliac stenting 03/23/15  -pt with hematoma to the right groin with ecchymosis.  There are palpable femoral pulses bilaterally -u/s in vascular lab reveals no evidence of pseudoaneurysm or bleed and probable bilateral hematoma measuring 3.7cm x 1.5cm on the right and 2.4cm x 1.8cm on the left -pt reassured and discussed with pt the hematoma will eventually be reabsorbed by the body over time. -several steri strips removed and incisions healing nicely -pt will keep  his appt with Dr. Donnetta Hutching on 04/14/15    Leontine Locket, PA-C [facility] [phone]  Clinic MD:  Pt seen and examined with Dr. Scot Dock

## 2015-04-01 NOTE — Telephone Encounter (Signed)
Discussed with Dr. Scot Dock.  Recommended to schedule Bilat. LE arterial duplex and to see the PA today.  Appt. Given to pt. For 1:00 PM; advised to arrive at 12:45 PM.  Verb. Understanding.  Agrees with plan.

## 2015-04-02 DIAGNOSIS — H43812 Vitreous degeneration, left eye: Secondary | ICD-10-CM | POA: Diagnosis not present

## 2015-04-10 ENCOUNTER — Encounter: Payer: Self-pay | Admitting: Vascular Surgery

## 2015-04-14 ENCOUNTER — Ambulatory Visit (INDEPENDENT_AMBULATORY_CARE_PROVIDER_SITE_OTHER): Payer: Self-pay | Admitting: Vascular Surgery

## 2015-04-14 ENCOUNTER — Encounter: Payer: Self-pay | Admitting: Vascular Surgery

## 2015-04-14 VITALS — BP 125/81 | HR 87 | Ht 73.0 in | Wt 178.9 lb

## 2015-04-14 DIAGNOSIS — I739 Peripheral vascular disease, unspecified: Secondary | ICD-10-CM

## 2015-04-14 DIAGNOSIS — Z48812 Encounter for surgical aftercare following surgery on the circulatory system: Secondary | ICD-10-CM

## 2015-04-14 NOTE — Progress Notes (Signed)
Here today for follow-up of bilateral femoral endarterectomy and patch angioplasty and bilateral iliac stenting on 03/23/2015. He has done quite well since surgery. He did see Dr. Scot Dock was some concern of some fullness in his groins postop. This was seen with ultrasound and found to be a postoperative changes only. He is quite pleased with his result. He reports that he has was able to walk less than 1 block prior to surgery and is now walking a quarter mile on the track. He does report some diminished stamina.  Physical exam both groin incisions are healing quite nicely with the typical healing ridge. 2-3+ femoral pulses bilaterally  He does have known superficial femoral artery occlusive disease bilaterally and does not have pedal pulses.  Impression and plan: Improvement in the claudication symptoms. Is pleased with his results. We'll continue his walking program. He is asking if he can discontinue Pletal which had been started due to his limiting claudication preoperatively. I feel that this is appropriate since he has had marked symptom relief with improvement in his vascular flow. We will see him again in 3 months with vascular lab ankle arm index at that

## 2015-04-14 NOTE — Addendum Note (Signed)
Addended by: Dorthula Rue L on: 04/14/2015 10:05 AM   Modules accepted: Orders

## 2015-06-01 ENCOUNTER — Ambulatory Visit: Payer: Medicare Other | Admitting: Family

## 2015-06-01 ENCOUNTER — Ambulatory Visit (INDEPENDENT_AMBULATORY_CARE_PROVIDER_SITE_OTHER)
Admission: RE | Admit: 2015-06-01 | Discharge: 2015-06-01 | Disposition: A | Discharge status: Home / Self Care | Payer: Medicare Other | Source: Ambulatory Visit | Attending: Vascular Surgery | Admitting: Vascular Surgery

## 2015-06-01 ENCOUNTER — Ambulatory Visit (HOSPITAL_COMMUNITY)
Admission: RE | Admit: 2015-06-01 | Discharge: 2015-06-01 | Disposition: A | Discharge status: Home / Self Care | Payer: Medicare Other | Source: Ambulatory Visit | Attending: Vascular Surgery | Admitting: Vascular Surgery

## 2015-06-01 DIAGNOSIS — Z48812 Encounter for surgical aftercare following surgery on the circulatory system: Secondary | ICD-10-CM | POA: Diagnosis not present

## 2015-06-01 DIAGNOSIS — Z95820 Peripheral vascular angioplasty status with implants and grafts: Secondary | ICD-10-CM | POA: Diagnosis not present

## 2015-06-01 DIAGNOSIS — I6529 Occlusion and stenosis of unspecified carotid artery: Secondary | ICD-10-CM

## 2015-06-01 DIAGNOSIS — I6523 Occlusion and stenosis of bilateral carotid arteries: Secondary | ICD-10-CM | POA: Diagnosis not present

## 2015-06-01 DIAGNOSIS — I739 Peripheral vascular disease, unspecified: Secondary | ICD-10-CM | POA: Insufficient documentation

## 2015-06-01 DIAGNOSIS — I714 Abdominal aortic aneurysm, without rupture, unspecified: Secondary | ICD-10-CM

## 2015-06-01 DIAGNOSIS — E119 Type 2 diabetes mellitus without complications: Secondary | ICD-10-CM | POA: Diagnosis not present

## 2015-06-01 DIAGNOSIS — I70219 Atherosclerosis of native arteries of extremities with intermittent claudication, unspecified extremity: Secondary | ICD-10-CM

## 2015-06-02 ENCOUNTER — Ambulatory Visit: Payer: Medicare Other | Admitting: Family

## 2015-06-02 ENCOUNTER — Other Ambulatory Visit (HOSPITAL_COMMUNITY): Payer: Medicare Other

## 2015-06-02 ENCOUNTER — Other Ambulatory Visit: Payer: Self-pay | Admitting: Vascular Surgery

## 2015-06-02 DIAGNOSIS — I714 Abdominal aortic aneurysm, without rupture, unspecified: Secondary | ICD-10-CM

## 2015-06-02 DIAGNOSIS — I6529 Occlusion and stenosis of unspecified carotid artery: Secondary | ICD-10-CM

## 2015-06-10 ENCOUNTER — Encounter: Payer: Self-pay | Admitting: Family

## 2015-06-11 ENCOUNTER — Encounter: Payer: Self-pay | Admitting: Family

## 2015-06-11 ENCOUNTER — Ambulatory Visit (INDEPENDENT_AMBULATORY_CARE_PROVIDER_SITE_OTHER): Payer: Self-pay | Admitting: Family

## 2015-06-11 VITALS — BP 116/72 | HR 63 | Temp 97.4°F | Ht 73.0 in | Wt 177.0 lb

## 2015-06-11 DIAGNOSIS — I714 Abdominal aortic aneurysm, without rupture, unspecified: Secondary | ICD-10-CM

## 2015-06-11 DIAGNOSIS — I6523 Occlusion and stenosis of bilateral carotid arteries: Secondary | ICD-10-CM

## 2015-06-11 DIAGNOSIS — Z48812 Encounter for surgical aftercare following surgery on the circulatory system: Secondary | ICD-10-CM

## 2015-06-11 DIAGNOSIS — Z95828 Presence of other vascular implants and grafts: Secondary | ICD-10-CM

## 2015-06-11 DIAGNOSIS — Z9889 Other specified postprocedural states: Secondary | ICD-10-CM

## 2015-06-11 DIAGNOSIS — I70219 Atherosclerosis of native arteries of extremities with intermittent claudication, unspecified extremity: Secondary | ICD-10-CM

## 2015-06-11 NOTE — Patient Instructions (Signed)
Peripheral Vascular Disease Peripheral Vascular Disease (PVD), also called Peripheral Arterial Disease (PAD), is a circulation problem caused by cholesterol (atherosclerotic plaque) deposits in the arteries. PVD commonly occurs in the lower extremities (legs) but it can occur in other areas of the body, such as your arms. The cholesterol buildup in the arteries reduces blood flow which can cause pain and other serious problems. The presence of PVD can place a person at risk for Coronary Artery Disease (CAD).  CAUSES  Causes of PVD can be many. It is usually associated with more than one risk factor such as:   High Cholesterol.  Smoking.  Diabetes.  Lack of exercise or inactivity.  High blood pressure (hypertension).  Obesity.  Family history. SYMPTOMS   When the lower extremities are affected, patients with PVD may experience:  Leg pain with exertion or physical activity. This is called INTERMITTENT CLAUDICATION. This may present as cramping or numbness with physical activity. The location of the pain is associated with the level of blockage. For example, blockage at the abdominal level (distal abdominal aorta) may result in buttock or hip pain. Lower leg arterial blockage may result in calf pain.  As PVD becomes more severe, pain can develop with less physical activity.  In people with severe PVD, leg pain may occur at rest.  Other PVD signs and symptoms:  Leg numbness or weakness.  Coldness in the affected leg or foot, especially when compared to the other leg.  A change in leg color.  Patients with significant PVD are more prone to ulcers or sores on toes, feet or legs. These may take longer to heal or may reoccur. The ulcers or sores can become infected.  If signs and symptoms of PVD are ignored, gangrene may occur. This can result in the loss of toes or loss of an entire limb.  Not all leg pain is related to PVD. Other medical conditions can cause leg pain such  as:  Blood clots (embolism) or Deep Vein Thrombosis.  Inflammation of the blood vessels (vasculitis).  Spinal stenosis. DIAGNOSIS  Diagnosis of PVD can involve several different types of tests. These can include:  Pulse Volume Recording Method (PVR). This test is simple, painless and does not involve the use of X-rays. PVR involves measuring and comparing the blood pressure in the arms and legs. An ABI (Ankle-Brachial Index) is calculated. The normal ratio of blood pressures is 1. As this number becomes smaller, it indicates more severe disease.  < 0.95 - indicates significant narrowing in one or more leg vessels.  <0.8 - there will usually be pain in the foot, leg or buttock with exercise.  <0.4 - will usually have pain in the legs at rest.  <0.25 - usually indicates limb threatening PVD.  Doppler detection of pulses in the legs. This test is painless and checks to see if you have a pulses in your legs/feet.  A dye or contrast material (a substance that highlights the blood vessels so they show up on x-ray) may be given to help your caregiver better see the arteries for the following tests. The dye is eliminated from your body by the kidney's. Your caregiver may order blood work to check your kidney function and other laboratory values before the following tests are performed:  Magnetic Resonance Angiography (MRA). An MRA is a picture study of the blood vessels and arteries. The MRA machine uses a large magnet to produce images of the blood vessels.  Computed Tomography Angiography (CTA). A CTA   is a specialized x-ray that looks at how the blood flows in your blood vessels. An IV may be inserted into your arm so contrast dye can be injected.  Angiogram. Is a procedure that uses x-rays to look at your blood vessels. This procedure is minimally invasive, meaning a small incision (cut) is made in your groin. A small tube (catheter) is then inserted into the artery of your groin. The catheter  is guided to the blood vessel or artery your caregiver wants to examine. Contrast dye is injected into the catheter. X-rays are then taken of the blood vessel or artery. After the images are obtained, the catheter is taken out. TREATMENT  Treatment of PVD involves many interventions which may include:  Lifestyle changes:  Quitting smoking.  Exercise.  Following a low fat, low cholesterol diet.  Control of diabetes.  Foot care is very important to the PVD patient. Good foot care can help prevent infection.  Medication:  Cholesterol-lowering medicine.  Blood pressure medicine.  Anti-platelet drugs.  Certain medicines may reduce symptoms of Intermittent Claudication.  Interventional/Surgical options:  Angioplasty. An Angioplasty is a procedure that inflates a balloon in the blocked artery. This opens the blocked artery to improve blood flow.  Stent Implant. A wire mesh tube (stent) is placed in the artery. The stent expands and stays in place, allowing the artery to remain open.  Peripheral Bypass Surgery. This is a surgical procedure that reroutes the blood around a blocked artery to help improve blood flow. This type of procedure may be performed if Angioplasty or stent implants are not an option. SEEK IMMEDIATE MEDICAL CARE IF:   You develop pain or numbness in your arms or legs.  Your arm or leg turns cold, becomes blue in color.  You develop redness, warmth, swelling and pain in your arms or legs. MAKE SURE YOU:   Understand these instructions.  Will watch your condition.  Will get help right away if you are not doing well or get worse. Document Released: 11/24/2004 Document Revised: 01/09/2012 Document Reviewed: 10/21/2008 [facility] Patient Information 2015 [place], [place]. This information is not intended to replace advice given to you by your health care provider. Make sure you discuss any questions you have with your health care provider.    Stroke  Prevention Some medical conditions and behaviors are associated with an increased chance of having a stroke. You may prevent a stroke by making healthy choices and managing medical conditions. HOW CAN I REDUCE MY RISK OF HAVING A STROKE?   Stay physically active. Get at least 30 minutes of activity on most or all days.  Do not smoke. It may also be helpful to avoid exposure to secondhand smoke.  Limit alcohol use. Moderate alcohol use is considered to be:  No more than 2 drinks per day for men.  No more than 1 drink per day for nonpregnant women.  Eat healthy foods. This involves:  Eating 5 or more servings of fruits and vegetables a day.  Making dietary changes that address high blood pressure (hypertension), high cholesterol, diabetes, or obesity.  Manage your cholesterol levels.  Making food choices that are high in fiber and low in saturated fat, trans fat, and cholesterol may control cholesterol levels.  Take any prescribed medicines to control cholesterol as directed by your health care provider.  Manage your diabetes.  Controlling your carbohydrate and sugar intake is recommended to manage diabetes.  Take any prescribed medicines to control diabetes as directed by your health care  provider.  Control your hypertension.  Making food choices that are low in salt (sodium), saturated fat, trans fat, and cholesterol is recommended to manage hypertension.  Take any prescribed medicines to control hypertension as directed by your health care provider.  Maintain a healthy weight.  Reducing calorie intake and making food choices that are low in sodium, saturated fat, trans fat, and cholesterol are recommended to manage weight.  Stop drug abuse.  Avoid taking birth control pills.  Talk to your health care provider about the risks of taking birth control pills if you are over 57 years old, smoke, get migraines, or have ever had a blood clot.  Get evaluated for sleep  disorders (sleep apnea).  Talk to your health care provider about getting a sleep evaluation if you snore a lot or have excessive sleepiness.  Take medicines only as directed by your health care provider.  For some people, aspirin or blood thinners (anticoagulants) are helpful in reducing the risk of forming abnormal blood clots that can lead to stroke. If you have the irregular heart rhythm of atrial fibrillation, you should be on a blood thinner unless there is a good reason you cannot take them.  Understand all your medicine instructions.  Make sure that other conditions (such as anemia or atherosclerosis) are addressed. SEEK IMMEDIATE MEDICAL CARE IF:   You have sudden weakness or numbness of the face, arm, or leg, especially on one side of the body.  Your face or eyelid droops to one side.  You have sudden confusion.  You have trouble speaking (aphasia) or understanding.  You have sudden trouble seeing in one or both eyes.  You have sudden trouble walking.  You have dizziness.  You have a loss of balance or coordination.  You have a sudden, severe headache with no known cause.  You have new chest pain or an irregular heartbeat. Any of these symptoms may represent a serious problem that is an emergency. Do not wait to see if the symptoms will go away. Get medical help at once. Call your local emergency services (911 in [place] Do not drive yourself to the hospital. Document Released: 11/24/2004 Document Revised: 03/03/2014 Document Reviewed: 04/19/2013 [facility] Patient Information 2015 [place], [place]. This information is not intended to replace advice given to you by your health care provider. Make sure you discuss any questions you have with your health care provider.    Abdominal Aortic Aneurysm An aneurysm is a weakened or damaged part of an artery wall that bulges from the normal force of blood pumping through the body. An abdominal aortic aneurysm is an aneurysm that  occurs in the lower part of the aorta, the main artery of the body.  The major concern with an abdominal aortic aneurysm is that it can enlarge and burst (rupture) or blood can flow between the layers of the wall of the aorta through a tear (aorticdissection). Both of these conditions can cause bleeding inside the body and can be life threatening unless diagnosed and treated promptly. CAUSES  The exact cause of an abdominal aortic aneurysm is unknown. Some contributing factors are:   A hardening of the arteries caused by the buildup of fat and other substances in the lining of a blood vessel (arteriosclerosis).  Inflammation of the walls of an artery (arteritis).   Connective tissue diseases, such as Marfan syndrome.   Abdominal trauma.   An infection, such as syphilis or staphylococcus, in the wall of the aorta (infectious aortitis) caused by bacteria.  RISK FACTORS  Risk factors that contribute to an abdominal aortic aneurysm may include:  Age older than 73 years.   High blood pressure (hypertension).  Male gender.  Ethnicity (white race).  Obesity.  Family history of aneurysm (first degree relatives only).  Tobacco use. PREVENTION  The following healthy lifestyle habits may help decrease your risk of abdominal aortic aneurysm:  Quitting smoking. Smoking can raise your blood pressure and cause arteriosclerosis.  Limiting or avoiding alcohol.  Keeping your blood pressure, blood sugar level, and cholesterol levels within normal limits.  Decreasing your salt intake. In somepeople, too much salt can raise blood pressure and increase your risk of abdominal aortic aneurysm.  Eating a diet low in saturated fats and cholesterol.  Increasing your fiber intake by including whole grains, vegetables, and fruits in your diet. Eating these foods may help lower blood pressure.  Maintaining a healthy weight.  Staying physically active and exercising regularly. SYMPTOMS  The  symptoms of abdominal aortic aneurysm may vary depending on the size and rate of growth of the aneurysm.Most grow slowly and do not have any symptoms. When symptoms do occur, they may include:  Pain (abdomen, side, lower back, or groin). The pain may vary in intensity. A sudden onset of severe pain may indicate that the aneurysm has ruptured.  Feeling full after eating only small amounts of food.  Nausea or vomiting or both.  Feeling a pulsating lump in the abdomen.  Feeling faint or passing out. DIAGNOSIS  Since most unruptured abdominal aortic aneurysms have no symptoms, they are often discovered during diagnostic exams for other conditions. An aneurysm may be found during the following procedures:  Ultrasonography (A one-time screening for abdominal aortic aneurysm by ultrasonography is also recommended for all men aged 31-75 years who have ever smoked).  X-ray exams.  A computed tomography (CT).  Magnetic resonance imaging (MRI).  Angiography or arteriography. TREATMENT  Treatment of an abdominal aortic aneurysm depends on the size of your aneurysm, your age, and risk factors for rupture. Medication to control blood pressure and pain may be used to manage aneurysms smaller than 6 cm. Regular monitoring for enlargement may be recommended by your caregiver if:  The aneurysm is 3-4 cm in size (an annual ultrasonography may be recommended).  The aneurysm is 4-4.5 cm in size (an ultrasonography every 6 months may be recommended).  The aneurysm is larger than 4.5 cm in size (your caregiver may ask that you be examined by a vascular surgeon). If your aneurysm is larger than 6 cm, surgical repair may be recommended. There are two main methods for repair of an aneurysm:   Endovascular repair (a minimally invasive surgery). This is done most often.  Open repair. This method is used if an endovascular repair is not possible. Document Released: 07/27/2005 Document Revised: 02/11/2013  Document Reviewed: 11/16/2012 [facility] Patient Information 2015 [place], [place]. This information is not intended to replace advice given to you by your health care provider. Make sure you discuss any questions you have with your health care provider.

## 2015-06-11 NOTE — Progress Notes (Signed)
[facility] [place] HISTORY AND PHYSICAL   MRN : [id number]  History of Present Illness:   [name] is a 80 y.o. male patient of Dr. Donnetta Hutching returns today for follow-up of bilateral femoral endarterectomy and patch angioplasty and bilateral iliac stenting on 03/23/2015. He has done quite well since surgery. He did see Dr. Scot Dock was some concern of some fullness in his groins postop. This was seen with ultrasound and found to be a postoperative changes only. He is quite pleased with his result. He reports that he has was able to walk less than 1 block prior to surgery and is now walking 2 miles on the track, 3 days/week. He also lifts weights. He reports that his stamina has improved a great deal.  He has a very complex past history. He also has a small infrarenal abdominal aortic aneurysm which is been followed with serial exams. He has lumbar disc disease and some arthritic changes; he has had ESI's in the past. He denies any abdominal pain, denies any new back pain.  The plantar surfaces of his feel sore at times, he attributes this to DM neuropathy, denies non healing wounds.   Pletal was stopped at a previous visit as pt has no more claudication symptoms.  He denies any history of stroke or TIA.  Pt Diabetic: Yes, pt states his last A1C was 6.6 Pt smoker: smoker  (1/2 ppd, started smoking at age 26 yrs)  Pt meds include: Statin :No, has myalgias from statin Betablocker: Yes ASA: Yes Other anticoagulants/antiplatelets: no  Current Outpatient Prescriptions  Medication Sig Dispense Refill  . aspirin 81 MG tablet Take 81 mg by mouth daily.      . Coenzyme Q10 (CO Q-10 PO) Take 300 mg by mouth 2 (two) times a week. Take on Mon and Fri.    . metFORMIN (GLUCOPHAGE) 1000 MG tablet Take 1 tablet by mouth Daily.     . metoprolol succinate (TOPROL-XL) 25 MG 24 hr tablet Take 25 mg by mouth daily.    . pantoprazole (PROTONIX) 40 MG tablet Take 1 tablet (40 mg  total) by mouth daily. 30 tablet 6  . ramipril (ALTACE) 5 MG capsule Take 1 tablet by mouth Daily.     . traMADol (ULTRAM) 50 MG tablet Take 50 mg by mouth 1 day or 1 dose.    . chlorproMAZINE (THORAZINE) 25 MG tablet Take 0.5 tablets (12.5 mg total) by mouth 3 (three) times daily as needed for hiccoughs. (Patient not taking: Reported on 06/11/2015) 20 tablet 1  . cilostazol (PLETAL) 100 MG tablet Take 100 mg by mouth 2 (two) times daily.    . ondansetron (ZOFRAN) 4 MG tablet Take 1 tablet (4 mg total) by mouth every 8 (eight) hours as needed for nausea or vomiting. (Patient not taking: Reported on 04/01/2015) 20 tablet 0  . oxyCODONE-acetaminophen (PERCOCET/ROXICET) 5-325 MG per tablet Take 1-2 tablets by mouth every 4 (four) hours as needed for moderate pain. (Patient not taking: Reported on 04/01/2015) 30 tablet 0   No current facility-administered medications for this visit.    Past Medical History  Diagnosis Date  . Cataract   . Diabetes mellitus   . Hyperlipidemia   . Hypertension   . Helicobacter pylori gastritis 01/2006, 03/2011    EGD - Pylera Tx 2010  . CAD (coronary artery disease)   . PAD (peripheral artery disease)   . Hiccups   . History of kidney stones   . GERD (gastroesophageal  reflux disease)   . Arthritis     Social History Social History  Substance Use Topics  . Smoking status: Current Every Day Smoker -- 0.25 packs/day for 40 years    Types: Cigarettes  . Smokeless tobacco: Never Used  . Alcohol Use: No    Family History Family History  Problem Relation Age of Onset  . Colon cancer Neg Hx   . Heart disease Father   . Diabetes Father   . Heart attack Father   . Hypertension Father   . Varicose Veins Father   . Cancer Sister     colon  . Diabetes Sister   . Other Sister     varicose veins  . Heart attack Sister   . Heart disease Sister   . Hyperlipidemia Sister   . Hypertension Sister   . Varicose Veins Sister   . Cancer Brother     prostate  .  Diabetes Brother   . Heart disease Brother   . Hyperlipidemia Brother   . Hypertension Brother   . Other Brother     varicose veins  . Heart attack Brother   . Varicose Veins Brother   . Heart disease Daughter   . Hyperlipidemia Daughter   . Hypertension Daughter   . Cancer Sister     lukemia    Surgical History Past Surgical History  Procedure Laterality Date  . Total knee arthroplasty  07-13-2012    right  . Hernia repair  12/24/2010  . Cataract extraction      right  . Colonoscopy  2002, 2007, 06/09/2011    Dr. Evelina Bucy - [name], [place]: for FHx colon cancer, no adenomas; 2012: [facility] - normal  . Esophagogastroduodenoscopy  02/20/06    Dr. Evelina Bucy, [name], [place]. pylori gastritis and GERD changes (pathology)  . Upper gastrointestinal endoscopy  03/09/2009    Dr. Carlean Purl: H. pylori gastritis (Pylera Tx) and 2 cm hiatal hernia  . Coronary artery bypass graft  05-07-2008  . Joint replacement      right knee replacement  . Peripheral vascular catheterization N/A 03/11/2015    Procedure: Abdominal Aortogram w/Lower Extremity;  Surgeon: Rosetta Posner, MD;  Location: [facility];  Service: Cardiovascular;  Laterality: N/A;  . Eye surgery Left     cataract  . Endarterectomy femoral Bilateral 03/23/2015    Procedure: ENDARTERECTOMY OF BILATERAL COMMON FEMORAL AND PROFUNDA ARTERIES;  Surgeon: Rosetta Posner, MD;  Location: [facility];  Service: Vascular;  Laterality: Bilateral;  . Insertion of iliac stent Bilateral 03/23/2015    Procedure: LEFT EXTERNAL ILIAC AND BILATERAL COMMON ILIAC STENT;  Surgeon: Rosetta Posner, MD;  Location: [facility];  Service: Vascular;  Laterality: Bilateral;  . Patch angioplasty Bilateral 03/23/2015    Procedure: PATCH ANGIOPLASTY OF BILATERAL COMMON FEMORAL ARTERIES;  Surgeon: Rosetta Posner, MD;  Location: [facility] OR;  Service: Vascular;  Laterality: Bilateral;    Allergies  Allergen Reactions  . Sulfonamide Derivatives Hives    Current Outpatient Prescriptions   Medication Sig Dispense Refill  . aspirin 81 MG tablet Take 81 mg by mouth daily.      . Coenzyme Q10 (CO Q-10 PO) Take 300 mg by mouth 2 (two) times a week. Take on Mon and Fri.    . metFORMIN (GLUCOPHAGE) 1000 MG tablet Take 1 tablet by mouth Daily.     . metoprolol succinate (TOPROL-XL) 25 MG 24 hr tablet Take 25 mg by mouth daily.    . pantoprazole (PROTONIX) 40 MG  tablet Take 1 tablet (40 mg total) by mouth daily. 30 tablet 6  . ramipril (ALTACE) 5 MG capsule Take 1 tablet by mouth Daily.     . traMADol (ULTRAM) 50 MG tablet Take 50 mg by mouth 1 day or 1 dose.    . chlorproMAZINE (THORAZINE) 25 MG tablet Take 0.5 tablets (12.5 mg total) by mouth 3 (three) times daily as needed for hiccoughs. (Patient not taking: Reported on 06/11/2015) 20 tablet 1  . cilostazol (PLETAL) 100 MG tablet Take 100 mg by mouth 2 (two) times daily.    . ondansetron (ZOFRAN) 4 MG tablet Take 1 tablet (4 mg total) by mouth every 8 (eight) hours as needed for nausea or vomiting. (Patient not taking: Reported on 04/01/2015) 20 tablet 0  . oxyCODONE-acetaminophen (PERCOCET/ROXICET) 5-325 MG per tablet Take 1-2 tablets by mouth every 4 (four) hours as needed for moderate pain. (Patient not taking: Reported on 04/01/2015) 30 tablet 0   No current facility-administered medications for this visit.     REVIEW OF SYSTEMS: See HPI for pertinent positives and negatives.  Physical Examination Filed Vitals:   06/11/15 1348  BP: 116/72  Pulse: 63  Temp: 97.4 F (36.3 C)  TempSrc: Oral  Height: 6\' 1"  (1.854 m)  Weight: 177 lb (80.287 kg)  SpO2: 98%   Body mass index is 23.36 kg/(m^2).  General:  WDWN in NAD Gait: Normal HENT: WNL Eyes: Pupils equal Pulmonary: normal non-labored breathing , without Rales, rhonchi,  wheezing Cardiac: RRR, no murmur detected  Abdomen: soft, NT, no masses palpated Skin: no rashes, no ulcers;  no Gangrene , no cellulitis; no open wounds.   VASCULAR EXAM  Carotid Bruits Right Left    Negative Positive   Radial pulses are 2+ palpable Aorta is palpable                   VASCULAR EXAM: Extremities without ischemic changes, without Gangrene; without open wounds.                                                                                                          LE Pulses Right Left       FEMORAL  2+ palpable  2+ palpable        POPLITEAL  not palpable   not palpable       POSTERIOR TIBIAL  not palpable   not palpable        DORSALIS PEDIS      ANTERIOR TIBIAL 1+ palpable  not palpable     Musculoskeletal: no muscle wasting or atrophy; no peripheral edema   Neurologic: A&O X 3; Appropriate Affect ;  SENSATION: normal; MOTOR FUNCTION: 5/5 Symmetric, CN 2-12 intact Speech is fluent/normal   Non-Invasive Vascular Imaging (06/01/15):   ABDOMINAL AORTA DUPLEX EVALUATION    INDICATION: Evaluation of abdominal aortic aneurysm    PREVIOUS INTERVENTION(S): None    DUPLEX EXAM:     LOCATION DIAMETER AP (cm) DIAMETER TRANSVERSE (cm) VELOCITIES (cm/sec)  Aorta Proximal 1.8 - 62  Aorta Mid 3.1 3.5 33  Aorta Distal 2.3 2.3 37  Right Common Iliac Artery .72 .50 145  Left Common Iliac Artery .93 .69 86    Previous max aortic diameter:  3.0 x 2.9 Date: 09/18/2013     ADDITIONAL FINDINGS:     IMPRESSION: 1.3.1 x 3.5 mid aortic aneurysm 2. Technically difficult exam due to excessive bowel gas.     Compared to the previous exam:  No significant change    CEREBROVASCULAR DUPLEX EVALUATION    INDICATION: Carotid artery disease    PREVIOUS INTERVENTION(S): None    DUPLEX EXAM: Carotid duplex    RIGHT  LEFT  Peak Systolic Velocities (cm/s) End Diastolic Velocities (cm/s) Plaque LOCATION Peak Systolic Velocities (cm/s) End Diastolic Velocities (cm/s) Plaque  73 11 - CCA PROXIMAL 70 13 HT  70 11 HT CCA MID 73 17 HT  53 10 HT CCA DISTAL 72 17 HT  51 5 - ECA 71 7 HT  101 25 HT ICA PROXIMAL 49 14 HT  67 16 - ICA MID 56 15 -  67 24 - ICA DISTAL 55 17  -    1.4 ICA / CCA Ratio (PSV) .76  Antegrade Vertebral Flow Antegrade  161 Brachial Systolic Pressure (mmHg) 096  Triphasic Brachial Artery Waveforms Triphasic    Plaque Morphology:  HM = Homogeneous, HT = Heterogeneous, CP = Calcific Plaque, SP = Smooth Plaque, IP = Irregular Plaque     ADDITIONAL FINDINGS: Significant plaque right internal carotid artery    IMPRESSION: 1. Less than 40% bilateral internal carotid artery stenosis    Compared to the previous exam:  No other exam performed here     ABI (Date: 06/11/2015)  R: 0.88 (0.71, pre-operative), DP: monophasic, PT: monophasic, TBI: 0.50  L: 0.83 (0.65), DP: monophasic, PT: monophasic, TBI: 0.36    ASSESSMENT:  [name] is a 80 y.o. male who is s/p bilateral femoral endarterectomy and patch angioplasty and bilateral iliac stenting on 03/23/2015. He has no more claudication symptoms, no non healing wounds.  He walks 2 miles on the track, 3 days/week. He also lifts weights. He reports that his stamina has improved a great deal. He has a right carotid artery bruit, has no history of stroke or TIA. He also has a small infrarenal abdominal aortic aneurysm which is been followed with serial exams. He has no abdominal pain, denies any new back pain.   Today's carotid Duplex suggests less than 40% bilateral internal carotid artery stenosis.  Today's AAA Duplex suggests a 1.3.1 x 3.5 mid aortic aneurysm, no significant change from 09/18/13.  ABI's today are improved from moderate to mild arterial occlusive disease compared to pre-operatively.  Fortunately his DM is in control, but unfortunately he continues to smoke and does not seem interested in quitting.  Face to face time with patient was 25 minutes. Over 50% of this time was spent on counseling and coordination of care.   PLAN:   The patient was counseled re smoking cessation and given several free resources re smoking cessation.  Based on today's exam and  non-invasive vascular lab results, the patient will follow up in 3 months with ABI's and 1 year with  AAA Duplex and carotid Duplex. I discussed in depth with the patient the nature of atherosclerosis, and emphasized the importance of maximal medical management including strict control of blood pressure, blood glucose, and lipid levels, obtaining regular exercise, and cessation of smoking.  The patient is aware that without maximal medical management the underlying atherosclerotic disease  process will progress, limiting the benefit of any interventions. Consideration for repair of AAA would be made when the size approaches 4.8 or 5.0 cm, growth > 1 cm/yr, and symptomatic status. The patient was given information about stroke prevention and what symptoms should prompt the patient to seek immediate medical care. The patient was given information about AAA including signs, symptoms, treatment,  what symptoms should prompt the patient to seek immediate medical care, and how to minimize the risk of enlargement and rupture of aneurysms. The patient was given information about PAD including signs, symptoms, treatment, what symptoms should prompt the patient to seek immediate medical care, and risk reduction measures to take. Thank you for allowing [place] to participate in this patient's care.  Clemon Chambers, RN, MSN, FNP-C [facility] Office: [phone]  Clinic MD: [facility]   06/11/2015 1:57 PM

## 2015-06-12 DIAGNOSIS — R634 Abnormal weight loss: Secondary | ICD-10-CM | POA: Diagnosis not present

## 2015-06-12 DIAGNOSIS — I2510 Atherosclerotic heart disease of native coronary artery without angina pectoris: Secondary | ICD-10-CM | POA: Diagnosis not present

## 2015-06-12 DIAGNOSIS — E785 Hyperlipidemia, unspecified: Secondary | ICD-10-CM | POA: Diagnosis not present

## 2015-06-12 DIAGNOSIS — E1149 Type 2 diabetes mellitus with other diabetic neurological complication: Secondary | ICD-10-CM | POA: Diagnosis not present

## 2015-06-12 DIAGNOSIS — I10 Essential (primary) hypertension: Secondary | ICD-10-CM | POA: Diagnosis not present

## 2015-06-12 DIAGNOSIS — E1151 Type 2 diabetes mellitus with diabetic peripheral angiopathy without gangrene: Secondary | ICD-10-CM | POA: Diagnosis not present

## 2015-06-12 DIAGNOSIS — E871 Hypo-osmolality and hyponatremia: Secondary | ICD-10-CM | POA: Diagnosis not present

## 2015-06-12 DIAGNOSIS — I714 Abdominal aortic aneurysm, without rupture: Secondary | ICD-10-CM | POA: Diagnosis not present

## 2015-06-12 DIAGNOSIS — I739 Peripheral vascular disease, unspecified: Secondary | ICD-10-CM | POA: Diagnosis not present

## 2015-06-12 DIAGNOSIS — G629 Polyneuropathy, unspecified: Secondary | ICD-10-CM | POA: Diagnosis not present

## 2015-06-12 DIAGNOSIS — F17200 Nicotine dependence, unspecified, uncomplicated: Secondary | ICD-10-CM | POA: Diagnosis not present

## 2015-06-12 DIAGNOSIS — Z6822 Body mass index (BMI) 22.0-22.9, adult: Secondary | ICD-10-CM | POA: Diagnosis not present

## 2015-06-15 NOTE — Addendum Note (Signed)
Addended by: Dorthula Rue L on: 06/15/2015 09:37 AM   Modules accepted: Orders

## 2015-07-03 DIAGNOSIS — L570 Actinic keratosis: Secondary | ICD-10-CM | POA: Diagnosis not present

## 2015-07-03 DIAGNOSIS — L814 Other melanin hyperpigmentation: Secondary | ICD-10-CM | POA: Diagnosis not present

## 2015-07-03 DIAGNOSIS — L720 Epidermal cyst: Secondary | ICD-10-CM | POA: Diagnosis not present

## 2015-07-03 DIAGNOSIS — L821 Other seborrheic keratosis: Secondary | ICD-10-CM | POA: Diagnosis not present

## 2015-07-03 DIAGNOSIS — Z85828 Personal history of other malignant neoplasm of skin: Secondary | ICD-10-CM | POA: Diagnosis not present

## 2015-07-13 DIAGNOSIS — E785 Hyperlipidemia, unspecified: Secondary | ICD-10-CM | POA: Diagnosis not present

## 2015-07-13 DIAGNOSIS — Z23 Encounter for immunization: Secondary | ICD-10-CM | POA: Diagnosis not present

## 2015-07-13 DIAGNOSIS — M1990 Unspecified osteoarthritis, unspecified site: Secondary | ICD-10-CM | POA: Diagnosis not present

## 2015-07-13 DIAGNOSIS — M71572 Other bursitis, not elsewhere classified, left ankle and foot: Secondary | ICD-10-CM | POA: Diagnosis not present

## 2015-07-13 DIAGNOSIS — M779 Enthesopathy, unspecified: Secondary | ICD-10-CM | POA: Diagnosis not present

## 2015-07-13 DIAGNOSIS — M65872 Other synovitis and tenosynovitis, left ankle and foot: Secondary | ICD-10-CM | POA: Diagnosis not present

## 2015-07-13 DIAGNOSIS — R634 Abnormal weight loss: Secondary | ICD-10-CM | POA: Diagnosis not present

## 2015-07-21 ENCOUNTER — Ambulatory Visit: Payer: Medicare Other | Admitting: Vascular Surgery

## 2015-07-21 ENCOUNTER — Encounter (HOSPITAL_COMMUNITY): Payer: Medicare Other

## 2015-09-22 ENCOUNTER — Ambulatory Visit: Payer: Medicare Other | Admitting: Vascular Surgery

## 2015-09-22 ENCOUNTER — Encounter (HOSPITAL_COMMUNITY): Payer: Medicare Other

## 2015-10-02 ENCOUNTER — Encounter: Payer: Self-pay | Admitting: Vascular Surgery

## 2015-10-06 ENCOUNTER — Ambulatory Visit (HOSPITAL_COMMUNITY)
Admission: RE | Admit: 2015-10-06 | Discharge: 2015-10-06 | Disposition: A | Discharge status: Home / Self Care | Payer: Medicare Other | Source: Ambulatory Visit | Attending: Vascular Surgery | Admitting: Vascular Surgery

## 2015-10-06 ENCOUNTER — Encounter: Payer: Self-pay | Admitting: Vascular Surgery

## 2015-10-06 ENCOUNTER — Ambulatory Visit (INDEPENDENT_AMBULATORY_CARE_PROVIDER_SITE_OTHER): Payer: Medicare Other | Admitting: Vascular Surgery

## 2015-10-06 VITALS — BP 132/74 | HR 56 | Temp 97.4°F | Resp 16 | Ht 74.0 in | Wt 183.0 lb

## 2015-10-06 DIAGNOSIS — I10 Essential (primary) hypertension: Secondary | ICD-10-CM | POA: Insufficient documentation

## 2015-10-06 DIAGNOSIS — I6523 Occlusion and stenosis of bilateral carotid arteries: Secondary | ICD-10-CM | POA: Diagnosis not present

## 2015-10-06 DIAGNOSIS — E119 Type 2 diabetes mellitus without complications: Secondary | ICD-10-CM | POA: Insufficient documentation

## 2015-10-06 DIAGNOSIS — I70219 Atherosclerosis of native arteries of extremities with intermittent claudication, unspecified extremity: Secondary | ICD-10-CM | POA: Diagnosis not present

## 2015-10-06 DIAGNOSIS — I739 Peripheral vascular disease, unspecified: Secondary | ICD-10-CM

## 2015-10-06 DIAGNOSIS — R938 Abnormal findings on diagnostic imaging of other specified body structures: Secondary | ICD-10-CM | POA: Insufficient documentation

## 2015-10-06 DIAGNOSIS — R0989 Other specified symptoms and signs involving the circulatory and respiratory systems: Secondary | ICD-10-CM | POA: Diagnosis not present

## 2015-10-06 DIAGNOSIS — Z48812 Encounter for surgical aftercare following surgery on the circulatory system: Secondary | ICD-10-CM

## 2015-10-06 DIAGNOSIS — Z95828 Presence of other vascular implants and grafts: Secondary | ICD-10-CM | POA: Diagnosis not present

## 2015-10-06 DIAGNOSIS — E785 Hyperlipidemia, unspecified: Secondary | ICD-10-CM | POA: Insufficient documentation

## 2015-10-06 DIAGNOSIS — Z9889 Other specified postprocedural states: Secondary | ICD-10-CM

## 2015-10-06 NOTE — Addendum Note (Signed)
Addended by: Dorthula Rue L on: 10/06/2015 04:58 PM   Modules accepted: Orders

## 2015-10-06 NOTE — Progress Notes (Signed)
[facility]ascular [facility] Specialist of [facility]  Patient name: [name] MRN: [id number] DOB: June 02, 1935 Sex: male  REASON FOR VISIT: follow-up  HPI: DRIN KISHIMOTO is a 80 y.o. male who returns for follow-up of his claudication s/p bilateral femoral endarterectomy and bilateral external iliac stenting. He reports that his left calf claudication is improved. For the past six weeks however, he has reported a constant numbness around his left medial ankle. He denies any pain with this. He denies any rest pain or non healing wounds. He did cut his left 4th toe nail too short today and cut his skin.   He continues to smoke daily with no intentions to quit. He reports that he would be out of "bad habits" if he stopped. He complains of back pain and is asking about a spinal epidural as he has received one in the past.   Past Medical History  Diagnosis Date  . Cataract   . Diabetes mellitus   . Hyperlipidemia   . Hypertension   . Helicobacter pylori gastritis 01/2006, 03/2011    EGD - Pylera Tx 2010  . CAD (coronary artery disease)   . PAD (peripheral artery disease) ([place])   . Hiccups   . History of kidney stones   . GERD (gastroesophageal reflux disease)   . Arthritis     Family History  Problem Relation Age of Onset  . Colon cancer Neg Hx   . Heart disease Father   . Diabetes Father   . Heart attack Father   . Hypertension Father   . Varicose Veins Father   . Cancer Sister     colon  . Diabetes Sister   . Other Sister     varicose veins  . Heart attack Sister   . Heart disease Sister   . Hyperlipidemia Sister   . Hypertension Sister   . Varicose Veins Sister   . Cancer Brother     prostate  . Diabetes Brother   . Heart disease Brother   . Hyperlipidemia Brother   . Hypertension Brother   . Other Brother     varicose veins  . Heart attack Brother   . Varicose Veins Brother   . Heart disease Daughter   . Hyperlipidemia Daughter   . Hypertension Daughter   . Cancer Sister      lukemia    SOCIAL HISTORY: Social History  Substance Use Topics  . Smoking status: Current Every Day Smoker -- 0.25 packs/day for 40 years    Types: Cigarettes  . Smokeless tobacco: Never Used  . Alcohol Use: No    Allergies  Allergen Reactions  . Sulfonamide Derivatives Hives    Current Outpatient Prescriptions  Medication Sig Dispense Refill  . aspirin 81 MG tablet Take 81 mg by mouth daily.      . cilostazol (PLETAL) 100 MG tablet Take 100 mg by mouth 2 (two) times daily.    . Coenzyme Q10 (CO Q-10 PO) Take 300 mg by mouth 2 (two) times a week. Take on Mon and Fri.    . metFORMIN (GLUCOPHAGE) 1000 MG tablet Take 1 tablet by mouth Daily.     . metoprolol succinate (TOPROL-XL) 25 MG 24 hr tablet Take 25 mg by mouth daily.    . ramipril (ALTACE) 5 MG capsule Take 1 tablet by mouth Daily.     . chlorproMAZINE (THORAZINE) 25 MG tablet Take 0.5 tablets (12.5 mg total) by mouth 3 (three) times daily as needed for hiccoughs. (Patient  not taking: Reported on 06/11/2015) 20 tablet 1  . ondansetron (ZOFRAN) 4 MG tablet Take 1 tablet (4 mg total) by mouth every 8 (eight) hours as needed for nausea or vomiting. (Patient not taking: Reported on 04/01/2015) 20 tablet 0  . oxyCODONE-acetaminophen (PERCOCET/ROXICET) 5-325 MG per tablet Take 1-2 tablets by mouth every 4 (four) hours as needed for moderate pain. (Patient not taking: Reported on 04/01/2015) 30 tablet 0  . pantoprazole (PROTONIX) 40 MG tablet Take 1 tablet (40 mg total) by mouth daily. (Patient not taking: Reported on 10/06/2015) 30 tablet 6  . traMADol (ULTRAM) 50 MG tablet Take 50 mg by mouth 1 day or 1 dose.     No current facility-administered medications for this visit.    REVIEW OF SYSTEMS:  [X]  denotes positive finding, [ ]  denotes negative finding Cardiac  Comments:  Chest pain or chest pressure:    Shortness of breath upon exertion:    Short of breath when lying flat:    Irregular heart rhythm:        Vascular      Pain in calf, thigh, or hip brought on by ambulation:    Pain in feet at night that wakes you up from your sleep:  x   Blood clot in your veins:    Leg swelling:         Pulmonary    Oxygen at home:    Productive cough:     Wheezing:         Neurologic    Sudden weakness in arms or legs:     Sudden numbness in arms or legs:     Sudden onset of difficulty speaking or slurred speech:    Temporary loss of vision in one eye:     Problems with dizziness:         Gastrointestinal    Blood in stool:     Vomited blood:         Genitourinary    Burning when urinating:     Blood in urine:        Psychiatric    Major depression:         Hematologic    Bleeding problems:    Problems with blood clotting too easily:        Skin    Rashes or ulcers:        Constitutional    Fever or chills:      PHYSICAL EXAM: Filed Vitals:   10/06/15 1534  BP: 132/74  Pulse: 56  Temp: 97.4 F (36.3 C)  TempSrc: Oral  Resp: 16  Height: 6\' 2"  (1.88 m)  Weight: 183 lb (83.008 kg)  SpO2: 99%    GENERAL: The patient is a well-nourished male, in no acute distress. The vital signs are documented above. CARDIAC: There is a regular rate and rhythm.  VASCULAR: 2+ radial pulses and 2+ femoral pulses bilaterally. Non palpable popliteal and pedal pulses. Feet are perfused without ischemic changes.  PULMONARY: There is good air exchange bilaterally without wheezing or rales. MUSCULOSKELETAL: There are no major deformities or cyanosis. NEUROLOGIC: No focal weakness or paresthesias are detected. SKIN: Left 4th toe with dried blood on bandage, no wound seen.  PSYCHIATRIC: The patient has a normal affect.  DATA:   ABIs 10/06/15 RLE: 0.87 with monophasic PT and DP flow, TBI 0.56 LLE: 0.72 with monophasic PT and DP flow, TBI 0.38   MEDICAL ISSUES: Peripheral vascular disease with intermittent claudication S/p bilateral femoral endartectomy and  bilateral iliac stenting  The patient's  claudication symptoms are improving. He does have some constant numbness around his left medial ankle. The etiology is unclear but this is unlikely a vascular issue. He is able to tolerate this however. His ABIs have remained stable with a mild drop in his left lower extremity. The patient continues to smoke and has no intentions on stopping despite repeated counseling. He will follow-up in one year with repeat ABIs. He knows to return sooner if his symptoms worsen.   Discussed that there are no contraindications to a spinal epidural from a vascular standpoint if he has continued back pain.   Alvia Grove [facility] [place]     I have examined the patient, reviewed and agree with above.  Curt Jews, MD 10/06/2015 4:50 PM

## 2015-10-21 ENCOUNTER — Encounter: Payer: Self-pay | Admitting: Cardiology

## 2015-10-30 DIAGNOSIS — E784 Other hyperlipidemia: Secondary | ICD-10-CM | POA: Diagnosis not present

## 2015-10-30 DIAGNOSIS — Z125 Encounter for screening for malignant neoplasm of prostate: Secondary | ICD-10-CM | POA: Diagnosis not present

## 2015-10-30 DIAGNOSIS — I10 Essential (primary) hypertension: Secondary | ICD-10-CM | POA: Diagnosis not present

## 2015-10-30 DIAGNOSIS — E1149 Type 2 diabetes mellitus with other diabetic neurological complication: Secondary | ICD-10-CM | POA: Diagnosis not present

## 2015-11-04 ENCOUNTER — Encounter: Payer: Self-pay | Admitting: Cardiology

## 2015-11-04 ENCOUNTER — Other Ambulatory Visit: Payer: Self-pay | Admitting: *Deleted

## 2015-11-04 ENCOUNTER — Ambulatory Visit (INDEPENDENT_AMBULATORY_CARE_PROVIDER_SITE_OTHER): Payer: Medicare Other | Admitting: Cardiology

## 2015-11-04 VITALS — BP 120/58 | HR 72 | Ht 74.0 in | Wt 184.0 lb

## 2015-11-04 DIAGNOSIS — I70213 Atherosclerosis of native arteries of extremities with intermittent claudication, bilateral legs: Secondary | ICD-10-CM

## 2015-11-04 DIAGNOSIS — E871 Hypo-osmolality and hyponatremia: Secondary | ICD-10-CM

## 2015-11-04 DIAGNOSIS — R0989 Other specified symptoms and signs involving the circulatory and respiratory systems: Secondary | ICD-10-CM

## 2015-11-04 DIAGNOSIS — Z951 Presence of aortocoronary bypass graft: Secondary | ICD-10-CM | POA: Diagnosis not present

## 2015-11-04 DIAGNOSIS — N1831 Chronic kidney disease, stage 3a: Secondary | ICD-10-CM | POA: Insufficient documentation

## 2015-11-04 DIAGNOSIS — E1159 Type 2 diabetes mellitus with other circulatory complications: Secondary | ICD-10-CM

## 2015-11-04 DIAGNOSIS — I10 Essential (primary) hypertension: Secondary | ICD-10-CM

## 2015-11-04 DIAGNOSIS — E785 Hyperlipidemia, unspecified: Secondary | ICD-10-CM

## 2015-11-04 NOTE — Assessment & Plan Note (Signed)
#  1 bilateral external iliac common femoral and profundus femoral endarterectomy and Dacron patch angioplasty #2 aortogram with left common iliac and left external iliac stenting and right external iliac stenting  SURGEON: Curt Jews, M.D.

## 2015-11-04 NOTE — Assessment & Plan Note (Signed)
NIDDM- on Glucophage 

## 2015-11-04 NOTE — Assessment & Plan Note (Signed)
Controlled.  

## 2015-11-04 NOTE — Progress Notes (Signed)
11/04/2015 [name]   07/20/1935  [id number]  Primary Physician [name], MD Primary Cardiologist: Dr Martinique  HPI:  He presented with unstable angina in 2009 and cardiac catheterization demonstrated severe left main and three-vessel coronary disease. He underwent coronary bypass surgery x5  by Dr. Roxan Hockey. Since then he has done very well from a cardiac standpoint.He  Had a Myoview in 2014 that was low risk. This past year he had fairly complex PV surgery by Dr Donnetta Hutching Fortunately, he has noticed improvement since this was done. He is in the office today for a routine check up. He denies any chest pain or unusual dyspnea.    Current Outpatient Prescriptions  Medication Sig Dispense Refill  . aspirin 81 MG tablet Take 81 mg by mouth daily.      . Coenzyme Q10 (CO Q-10 PO) Take 300 mg by mouth 2 (two) times a week. Take on Mon and Fri.    . metFORMIN (GLUCOPHAGE) 1000 MG tablet Take 1 tablet by mouth Daily.     . metoprolol succinate (TOPROL-XL) 25 MG 24 hr tablet Take 25 mg by mouth daily.    . ramipril (ALTACE) 5 MG capsule Take 1 tablet by mouth Daily.     . rosuvastatin (CRESTOR) 10 MG tablet Take 10 mg by mouth 3 (three) times a week.     No current facility-administered medications for this visit.    Allergies  Allergen Reactions  . Sulfonamide Derivatives Hives    Social History   Social History  . Marital Status: Married    Spouse Name: N/A  . Number of Children: 2  . Years of Education: N/A   Occupational History  . Retired    Social History Main Topics  . Smoking status: Current Every Day Smoker -- 0.25 packs/day for 40 years    Types: Cigarettes  . Smokeless tobacco: Never Used  . Alcohol Use: No  . Drug Use: No  . Sexual Activity: Not on file   Other Topics Concern  . Not on file   Social History Narrative     Review of Systems: General: negative for chills, fever, night sweats or weight changes.  Cardiovascular: negative for chest pain,  dyspnea on exertion, edema, orthopnea, palpitations, paroxysmal nocturnal dyspnea or shortness of breath Dermatological: negative for rash Respiratory: negative for cough or wheezing Urologic: negative for hematuria Abdominal: negative for nausea, vomiting, diarrhea, bright red blood per rectum, melena, or hematemesis Neurologic: negative for visual changes, syncope, or dizziness All other systems reviewed and are otherwise negative except as noted above.    Blood pressure 120/58, pulse 72, height 6\' 2"  (1.88 m), weight 184 lb (83.462 kg).  General appearance: alert, cooperative and no distress Neck: no JVD and soft LCA bruit Lungs: clear to auscultation bilaterally Heart: regular rate and rhythm Skin: Skin color, texture, turgor normal. No rashes or lesions Neurologic: Grossly normal   ASSESSMENT AND PLAN:   Hx of CABG 2009 coronary bypass grafting times five  (left internal mammary artery to LAD, saphenous vein graft to first  diagonal, saphenous vein graft to obtuse marginal one, sequential  saphenous vein graft to posterior descending and posterior  lateral). Low risk Myoview 2014  PVD with claudication - s/p PV surgery May 2016 #1 bilateral external iliac common femoral and profundus femoral endarterectomy and Dacron patch angioplasty #2 aortogram with left common iliac and left external iliac stenting and right external iliac stenting  SURGEON: Curt Jews, M.D.  Type 2 diabetes  mellitus with vascular disease ([place]) NIDDM- on Glucophage  Hyponatremia Appears to be chronic-Na 129-131  Hyperlipidemia Crestor 10 mg three time a week  Hypertension Controlled  Left carotid bruit Asymptomatic    PLAN  I ordered a carotid doppler. If this is stable he can f/u with Dr Martinique in one year. He see's his PCP this month for lipids.   Kerin Ransom K PA-C 11/04/2015 4:08 PM

## 2015-11-04 NOTE — Assessment & Plan Note (Signed)
Asymptomatic. 

## 2015-11-04 NOTE — Assessment & Plan Note (Signed)
coronary bypass grafting times five  (left internal mammary artery to LAD, saphenous vein graft to first  diagonal, saphenous vein graft to obtuse marginal one, sequential  saphenous vein graft to posterior descending and posterior  lateral). Low risk Myoview 2014

## 2015-11-04 NOTE — Patient Instructions (Signed)
Schedule Carotid Dopplers   Your physician wants you to follow-up in: 1 year with Dr.Jordan. You will receive a reminder letter in the mail two months in advance. If you don't receive a letter, please call our office to schedule the follow-up appointment.

## 2015-11-04 NOTE — Assessment & Plan Note (Signed)
Appears to be chronic-Na 129-131

## 2015-11-04 NOTE — Assessment & Plan Note (Addendum)
Crestor 10 mg three time a week

## 2015-11-06 DIAGNOSIS — E871 Hypo-osmolality and hyponatremia: Secondary | ICD-10-CM | POA: Diagnosis not present

## 2015-11-06 DIAGNOSIS — E1151 Type 2 diabetes mellitus with diabetic peripheral angiopathy without gangrene: Secondary | ICD-10-CM | POA: Diagnosis not present

## 2015-11-06 DIAGNOSIS — Z1389 Encounter for screening for other disorder: Secondary | ICD-10-CM | POA: Diagnosis not present

## 2015-11-06 DIAGNOSIS — E1149 Type 2 diabetes mellitus with other diabetic neurological complication: Secondary | ICD-10-CM | POA: Diagnosis not present

## 2015-11-06 DIAGNOSIS — M79672 Pain in left foot: Secondary | ICD-10-CM | POA: Diagnosis not present

## 2015-11-06 DIAGNOSIS — Z0000 Encounter for general adult medical examination without abnormal findings: Secondary | ICD-10-CM | POA: Diagnosis not present

## 2015-11-06 DIAGNOSIS — I10 Essential (primary) hypertension: Secondary | ICD-10-CM | POA: Diagnosis not present

## 2015-11-06 DIAGNOSIS — Z6825 Body mass index (BMI) 25.0-25.9, adult: Secondary | ICD-10-CM | POA: Diagnosis not present

## 2015-11-06 DIAGNOSIS — G629 Polyneuropathy, unspecified: Secondary | ICD-10-CM | POA: Diagnosis not present

## 2015-11-06 DIAGNOSIS — Z23 Encounter for immunization: Secondary | ICD-10-CM | POA: Diagnosis not present

## 2015-11-06 DIAGNOSIS — E784 Other hyperlipidemia: Secondary | ICD-10-CM | POA: Diagnosis not present

## 2015-11-06 DIAGNOSIS — I739 Peripheral vascular disease, unspecified: Secondary | ICD-10-CM | POA: Diagnosis not present

## 2015-11-17 ENCOUNTER — Encounter (HOSPITAL_COMMUNITY): Payer: Medicare Other

## 2016-02-02 DIAGNOSIS — M71572 Other bursitis, not elsewhere classified, left ankle and foot: Secondary | ICD-10-CM | POA: Diagnosis not present

## 2016-02-02 DIAGNOSIS — M722 Plantar fascial fibromatosis: Secondary | ICD-10-CM | POA: Diagnosis not present

## 2016-02-02 DIAGNOSIS — M7732 Calcaneal spur, left foot: Secondary | ICD-10-CM | POA: Diagnosis not present

## 2016-02-04 DIAGNOSIS — M71572 Other bursitis, not elsewhere classified, left ankle and foot: Secondary | ICD-10-CM | POA: Diagnosis not present

## 2016-02-04 DIAGNOSIS — M722 Plantar fascial fibromatosis: Secondary | ICD-10-CM | POA: Diagnosis not present

## 2016-03-03 DIAGNOSIS — M545 Low back pain: Secondary | ICD-10-CM | POA: Diagnosis not present

## 2016-03-03 DIAGNOSIS — M5440 Lumbago with sciatica, unspecified side: Secondary | ICD-10-CM | POA: Diagnosis not present

## 2016-03-03 DIAGNOSIS — M4310 Spondylolisthesis, site unspecified: Secondary | ICD-10-CM | POA: Diagnosis not present

## 2016-03-03 DIAGNOSIS — M4316 Spondylolisthesis, lumbar region: Secondary | ICD-10-CM | POA: Diagnosis not present

## 2016-05-10 DIAGNOSIS — Z955 Presence of coronary angioplasty implant and graft: Secondary | ICD-10-CM | POA: Diagnosis not present

## 2016-05-10 DIAGNOSIS — R29898 Other symptoms and signs involving the musculoskeletal system: Secondary | ICD-10-CM | POA: Diagnosis not present

## 2016-05-10 DIAGNOSIS — E784 Other hyperlipidemia: Secondary | ICD-10-CM | POA: Diagnosis not present

## 2016-05-10 DIAGNOSIS — I2510 Atherosclerotic heart disease of native coronary artery without angina pectoris: Secondary | ICD-10-CM | POA: Diagnosis not present

## 2016-05-10 DIAGNOSIS — R5383 Other fatigue: Secondary | ICD-10-CM | POA: Diagnosis not present

## 2016-05-10 DIAGNOSIS — F17200 Nicotine dependence, unspecified, uncomplicated: Secondary | ICD-10-CM | POA: Diagnosis not present

## 2016-05-10 DIAGNOSIS — M6281 Muscle weakness (generalized): Secondary | ICD-10-CM | POA: Diagnosis not present

## 2016-05-10 DIAGNOSIS — E1149 Type 2 diabetes mellitus with other diabetic neurological complication: Secondary | ICD-10-CM | POA: Diagnosis not present

## 2016-05-10 DIAGNOSIS — Z716 Tobacco abuse counseling: Secondary | ICD-10-CM | POA: Diagnosis not present

## 2016-05-10 DIAGNOSIS — Z6824 Body mass index (BMI) 24.0-24.9, adult: Secondary | ICD-10-CM | POA: Diagnosis not present

## 2016-05-10 DIAGNOSIS — I739 Peripheral vascular disease, unspecified: Secondary | ICD-10-CM | POA: Diagnosis not present

## 2016-05-10 DIAGNOSIS — I10 Essential (primary) hypertension: Secondary | ICD-10-CM | POA: Diagnosis not present

## 2016-05-10 DIAGNOSIS — E1151 Type 2 diabetes mellitus with diabetic peripheral angiopathy without gangrene: Secondary | ICD-10-CM | POA: Diagnosis not present

## 2016-06-09 DIAGNOSIS — M4310 Spondylolisthesis, site unspecified: Secondary | ICD-10-CM | POA: Diagnosis not present

## 2016-06-09 DIAGNOSIS — M5440 Lumbago with sciatica, unspecified side: Secondary | ICD-10-CM | POA: Diagnosis not present

## 2016-06-09 DIAGNOSIS — M545 Low back pain: Secondary | ICD-10-CM | POA: Diagnosis not present

## 2016-06-21 ENCOUNTER — Encounter (HOSPITAL_COMMUNITY): Payer: Medicare Other

## 2016-06-21 ENCOUNTER — Other Ambulatory Visit (HOSPITAL_COMMUNITY): Payer: Medicare Other

## 2016-06-21 ENCOUNTER — Ambulatory Visit: Payer: Medicare Other | Admitting: Family

## 2016-06-24 ENCOUNTER — Encounter: Payer: Self-pay | Admitting: Family

## 2016-06-28 ENCOUNTER — Ambulatory Visit (HOSPITAL_COMMUNITY)
Admission: RE | Admit: 2016-06-28 | Discharge: 2016-06-28 | Disposition: A | Discharge status: Home / Self Care | Payer: Medicare Other | Source: Ambulatory Visit | Attending: Family | Admitting: Family

## 2016-06-28 ENCOUNTER — Ambulatory Visit (INDEPENDENT_AMBULATORY_CARE_PROVIDER_SITE_OTHER)
Admission: RE | Admit: 2016-06-28 | Discharge: 2016-06-28 | Disposition: A | Discharge status: Home / Self Care | Payer: Medicare Other | Source: Ambulatory Visit | Attending: Family | Admitting: Family

## 2016-06-28 ENCOUNTER — Ambulatory Visit (INDEPENDENT_AMBULATORY_CARE_PROVIDER_SITE_OTHER): Payer: Medicare Other | Admitting: Family

## 2016-06-28 ENCOUNTER — Other Ambulatory Visit: Payer: Self-pay | Admitting: *Deleted

## 2016-06-28 ENCOUNTER — Encounter: Payer: Self-pay | Admitting: Family

## 2016-06-28 ENCOUNTER — Other Ambulatory Visit: Payer: Self-pay | Admitting: Family

## 2016-06-28 VITALS — BP 132/74 | HR 64 | Temp 97.0°F | Resp 20 | Ht 73.0 in | Wt 176.1 lb

## 2016-06-28 DIAGNOSIS — Z48812 Encounter for surgical aftercare following surgery on the circulatory system: Secondary | ICD-10-CM | POA: Insufficient documentation

## 2016-06-28 DIAGNOSIS — I714 Abdominal aortic aneurysm, without rupture, unspecified: Secondary | ICD-10-CM

## 2016-06-28 DIAGNOSIS — Z95828 Presence of other vascular implants and grafts: Secondary | ICD-10-CM

## 2016-06-28 DIAGNOSIS — I739 Peripheral vascular disease, unspecified: Secondary | ICD-10-CM | POA: Diagnosis not present

## 2016-06-28 DIAGNOSIS — Z9889 Other specified postprocedural states: Secondary | ICD-10-CM | POA: Insufficient documentation

## 2016-06-28 DIAGNOSIS — I70219 Atherosclerosis of native arteries of extremities with intermittent claudication, unspecified extremity: Secondary | ICD-10-CM | POA: Diagnosis not present

## 2016-06-28 DIAGNOSIS — I6523 Occlusion and stenosis of bilateral carotid arteries: Secondary | ICD-10-CM | POA: Diagnosis not present

## 2016-06-28 DIAGNOSIS — F17200 Nicotine dependence, unspecified, uncomplicated: Secondary | ICD-10-CM

## 2016-06-28 DIAGNOSIS — R0989 Other specified symptoms and signs involving the circulatory and respiratory systems: Secondary | ICD-10-CM | POA: Diagnosis not present

## 2016-06-28 DIAGNOSIS — Z720 Tobacco use: Secondary | ICD-10-CM

## 2016-06-28 DIAGNOSIS — R938 Abnormal findings on diagnostic imaging of other specified body structures: Secondary | ICD-10-CM | POA: Diagnosis not present

## 2016-06-28 LAB — VAS US CAROTID
LCCAPDIAS: 13 cm/s
LEFT ECA DIAS: -7 cm/s
LICAPDIAS: 18 cm/s
Left CCA dist dias: -12 cm/s
Left CCA dist sys: -57 cm/s
Left CCA prox sys: 69 cm/s
Left ICA dist dias: -18 cm/s
Left ICA dist sys: -58 cm/s
Left ICA prox sys: 70 cm/s
RIGHT CCA MID DIAS: 15 cm/s
RIGHT ECA DIAS: 6 cm/s
Right CCA prox dias: 13 cm/s
Right CCA prox sys: 73 cm/s
Right cca dist sys: -71 cm/s

## 2016-06-28 NOTE — Progress Notes (Signed)
[facility] [place]   CC: Follow up peripheral artery occlusive disease  History of Present Illness [name] is a 81 y.o. male patient of Dr. Donnetta Hutching returns today for follow-up of bilateral femoral endarterectomy and patch angioplasty and bilateral iliac stenting on 03/23/2015. He has done quite well since surgery. He did see Dr. Scot Dock was some concern of some fullness in his groins postop. This was seen with ultrasound and found to be a postoperative changes only. He is quite pleased with his result. He reports that he has was able to walk less than 1 block prior to surgery and had improved to walking 2 miles on the track, 3 days/week. He was also lifting weights. He reported that his stamina had improved a great deal.  However, since his last visit, he admits to not walking at all unless he has to, after a block both calves claudicate.   He has a very complex past history. He also has a small infrarenal abdominal aortic aneurysm which is been followed with serial exams. He has lumbar disc disease and some arthritic changes; he has had ESI's in the past. He denies any abdominal pain, denies any new back pain.  The plantar surfaces of his feel sore at times, he attributes this to DM neuropathy, denies non healing wounds.   Pletal was stopped at a previous visit as pt had no more claudication symptoms at that time; he declines resumption of the Pletal to help his returned claudication.  He denies any history of stroke or TIA.  Pt Diabetic: Yes, pt states his last A1C was 6.0 Pt smoker: smoker  (1/2 ppd, started smoking at age 89 yrs)  Pt meds include: Statin :No, has myalgias from statin Betablocker: Yes ASA: Yes Other anticoagulants/antiplatelets: no   Past Medical History:  Diagnosis Date  . Arthritis   . CAD (coronary artery disease)   . Cataract   . Diabetes mellitus   . GERD (gastroesophageal reflux disease)   . Helicobacter pylori gastritis  01/2006, 03/2011   EGD - Pylera Tx 2010  . Hiccups   . History of kidney stones   . Hyperlipidemia   . Hypertension   . PAD (peripheral artery disease) ([place])     Social History Social History  Substance Use Topics  . Smoking status: Current Every Day Smoker    Packs/day: 0.25    Years: 40.00    Types: Cigarettes  . Smokeless tobacco: Never Used  . Alcohol use No    Family History Family History  Problem Relation Age of Onset  . Colon cancer Neg Hx   . Heart disease Father   . Diabetes Father   . Heart attack Father   . Hypertension Father   . Varicose Veins Father   . Cancer Sister     colon  . Diabetes Sister   . Other Sister     varicose veins  . Heart attack Sister   . Heart disease Sister   . Hyperlipidemia Sister   . Hypertension Sister   . Varicose Veins Sister   . Cancer Brother     prostate  . Diabetes Brother   . Heart disease Brother   . Hyperlipidemia Brother   . Hypertension Brother   . Other Brother     varicose veins  . Heart attack Brother   . Varicose Veins Brother   . Heart disease Daughter   . Hyperlipidemia Daughter   . Hypertension Daughter   . Cancer  Sister     lukemia    Past Surgical History:  Procedure Laterality Date  . CATARACT EXTRACTION     right  . COLONOSCOPY  2002, 2007, 06/09/2011   Dr. Evelina Bucy - [name], [place]: for FHx colon cancer, no adenomas; 2012: [facility] - normal  . CORONARY ARTERY BYPASS GRAFT  05-07-2008  . ENDARTERECTOMY FEMORAL Bilateral 03/23/2015   Procedure: ENDARTERECTOMY OF BILATERAL COMMON FEMORAL AND PROFUNDA ARTERIES;  Surgeon: Rosetta Posner, MD;  Location: [facility];  Service: Vascular;  Laterality: Bilateral;  . ESOPHAGOGASTRODUODENOSCOPY  02/20/06   Dr. Evelina Bucy, [name], [place]. pylori gastritis and GERD changes (pathology)  . EYE SURGERY Left    cataract  . HERNIA REPAIR  12/24/2010  . INSERTION OF ILIAC STENT Bilateral 03/23/2015   Procedure: LEFT EXTERNAL ILIAC AND BILATERAL COMMON ILIAC STENT;   Surgeon: Rosetta Posner, MD;  Location: [facility];  Service: Vascular;  Laterality: Bilateral;  . JOINT REPLACEMENT     right knee replacement  . PATCH ANGIOPLASTY Bilateral 03/23/2015   Procedure: PATCH ANGIOPLASTY OF BILATERAL COMMON FEMORAL ARTERIES;  Surgeon: Rosetta Posner, MD;  Location: [facility];  Service: Vascular;  Laterality: Bilateral;  . PERIPHERAL VASCULAR CATHETERIZATION N/A 03/11/2015   Procedure: Abdominal Aortogram w/Lower Extremity;  Surgeon: Rosetta Posner, MD;  Location: [facility];  Service: Cardiovascular;  Laterality: N/A;  . TOTAL KNEE ARTHROPLASTY  07-13-2012   right  . UPPER GASTROINTESTINAL ENDOSCOPY  03/09/2009   Dr. Carlean Purl: H. pylori gastritis (Pylera Tx) and 2 cm hiatal hernia    Allergies  Allergen Reactions  . Sulfonamide Derivatives Hives    Current Outpatient Prescriptions  Medication Sig Dispense Refill  . aspirin 81 MG tablet Take 81 mg by mouth daily.      . Coenzyme Q10 (CO Q-10 PO) Take 300 mg by mouth 2 (two) times a week. Take on Mon and Fri.    . metFORMIN (GLUCOPHAGE) 1000 MG tablet Take 1 tablet by mouth Daily.     . metoprolol succinate (TOPROL-XL) 25 MG 24 hr tablet Take 25 mg by mouth daily.    . ramipril (ALTACE) 5 MG capsule Take 1 tablet by mouth Daily.      No current facility-administered medications for this visit.     ROS: See HPI for pertinent positives and negatives.   Physical Examination  Vitals:   06/28/16 1017  BP: 132/74  Pulse: 64  Resp: 20  Temp: 97 F (36.1 C)  SpO2: 99%  Weight: 176 lb 1.6 oz (79.9 kg)  Height: 6\' 1"  (1.854 m)   Body mass index is 23.23 kg/m.  General:  WDWN in NAD Gait: Normal HENT: WNL Eyes: Pupils equal Pulmonary: normal non-labored breathing, adequate air movement, without rales, rhonchi, or wheezing Cardiac: RRR, no murmur detected  Abdomen: soft, NT, no masses palpated Skin: no rashes, no ulcers, no cellulitis.   VASCULAR EXAM  Carotid Bruits Right Left   Negative Positive    Radial pulses are 2+ palpable Aorta is palpable                   VASCULAR EXAM: Extremities without ischemic changes, without Gangrene; without open wounds. Varicosities in both lower legs, hemosiderin deposits around right ankle.  LE Pulses Right Left       FEMORAL  2+ palpable  2+ palpable       POPLITEAL  not palpable  not palpable       POSTERIOR TIBIAL  not palpable  not palpable       DORSALIS PEDIS      ANTERIOR TIBIAL not palpable not palpable    Musculoskeletal: no muscle wasting or atrophy; no peripheral edema                        Neurologic: A&O X 3; Appropriate Affect ;  SENSATION: normal; MOTOR FUNCTION: 5/5 Symmetric, CN 2-12 intact Speech is fluent/normal    ASSESSMENT: DELFRED VISE is a 81 y.o. male  who is s/p bilateral femoral endarterectomy and patch angioplasty and bilateral iliac stenting on 03/23/2015. He has moderate claudication symptoms in both calves, no non healing wounds.  He no longer participates in the graduated walking program since he states the pain in his calves has returned, see Plan.  He has a left carotid artery bruit, has no history of stroke or TIA. He also has a small infrarenal abdominal aortic aneurysm which is been followed with serial exams. He has no abdominal pain, denies any new back pain.   Fortunately his DM is in control, but unfortunately he continues to smoke and does not seem interested in quitting.  His atherosclerotic risk factors also include dyslipidemia with statin intolerance, and CAD. He takes a daily ASA.   DATA Today's carotid Duplex suggests no CCA stenosis, no ECA stenosis, no ICA stenosis bilaterally. Both vertebral arteries remain antegrade, both subclavian artery waveforms remain multiphasic.   Today's  aortoiliac Duplex suggests a  3.2 cm mid aortic aneurysm, no significant change from 06/01/15. Limited visualization of the abdominal vasculature and stents due to overlying bowel gas. No significant stenosis of the bilateral iliac stents, based on limited visualization, biphasic waveforms throughout.   ABI's today are falsely elevated due to medial calcification, all waveforms are monophasic. Right TBI is 0.53 (toe pressure 73). Left TBI is 0.50 (toe pressure 69).    Face to face time with patient was 25 minutes. Over 50% of this time was spent on counseling and coordination of care.    PLAN:  The patient was counseled re smoking cessation and given several free resources re smoking cessation.  Graduated walking program discussed and how to achieve.    Based on the patient's vascular studies and examination, pt will return to clinic in 1 year with ABI's and bilateral aortoiliac duplex.  Left carotid bruit with no hx of stroke or TIA: Carotid duplex results have continued showing no stenosis or minimal stenosis; will defer any further carotid duplex exams unless symptoms warrant.   I discussed in depth with the patient the nature of atherosclerosis, and emphasized the importance of maximal medical management including strict control of blood pressure, blood glucose, and lipid levels, obtaining regular exercise, and cessation of smoking.  The patient is aware that without maximal medical management the underlying atherosclerotic disease process will progress, limiting the benefit of any interventions.  The patient was given information about PAD including signs, symptoms, treatment, what symptoms should prompt the patient to seek immediate medical care, and risk reduction measures to take.  Stephen Chambers, RN, MSN, FNP-C [facility] Phone: [phone]  Clinic MD: Early  06/28/16 10:41 AM

## 2016-06-28 NOTE — Patient Instructions (Addendum)
Abdominal Aortic Aneurysm An aneurysm is a weakened or damaged part of an artery wall that bulges from the normal force of blood pumping through the body. An abdominal aortic aneurysm is an aneurysm that occurs in the lower part of the aorta, the main artery of the body.  The major concern with an abdominal aortic aneurysm is that it can enlarge and burst (rupture) or blood can flow between the layers of the wall of the aorta through a tear (aorticdissection). Both of these conditions can cause bleeding inside the body and can be life threatening unless diagnosed and treated promptly. CAUSES  The exact cause of an abdominal aortic aneurysm is unknown. Some contributing factors are:   A hardening of the arteries caused by the buildup of fat and other substances in the lining of a blood vessel (arteriosclerosis).  Inflammation of the walls of an artery (arteritis).   Connective tissue diseases, such as Marfan syndrome.   Abdominal trauma.   An infection, such as syphilis or staphylococcus, in the wall of the aorta (infectious aortitis) caused by bacteria. RISK FACTORS  Risk factors that contribute to an abdominal aortic aneurysm may include:  Age older than 60 years.   High blood pressure (hypertension).  Male gender.  Ethnicity (white race).  Obesity.  Family history of aneurysm (first degree relatives only).  Tobacco use. PREVENTION  The following healthy lifestyle habits may help decrease your risk of abdominal aortic aneurysm:  Quitting smoking. Smoking can raise your blood pressure and cause arteriosclerosis.  Limiting or avoiding alcohol.  Keeping your blood pressure, blood sugar level, and cholesterol levels within normal limits.  Decreasing your salt intake. In somepeople, too much salt can raise blood pressure and increase your risk of abdominal aortic aneurysm.  Eating a diet low in saturated fats and cholesterol.  Increasing your fiber intake by including  whole grains, vegetables, and fruits in your diet. Eating these foods may help lower blood pressure.  Maintaining a healthy weight.  Staying physically active and exercising regularly. SYMPTOMS  The symptoms of abdominal aortic aneurysm may vary depending on the size and rate of growth of the aneurysm.Most grow slowly and do not have any symptoms. When symptoms do occur, they may include:  Pain (abdomen, side, lower back, or groin). The pain may vary in intensity. A sudden onset of severe pain may indicate that the aneurysm has ruptured.  Feeling full after eating only small amounts of food.  Nausea or vomiting or both.  Feeling a pulsating lump in the abdomen.  Feeling faint or passing out. DIAGNOSIS  Since most unruptured abdominal aortic aneurysms have no symptoms, they are often discovered during diagnostic exams for other conditions. An aneurysm may be found during the following procedures:  Ultrasonography (A one-time screening for abdominal aortic aneurysm by ultrasonography is also recommended for all men aged 65-75 years who have ever smoked).  X-ray exams.  A computed tomography (CT).  Magnetic resonance imaging (MRI).  Angiography or arteriography. TREATMENT  Treatment of an abdominal aortic aneurysm depends on the size of your aneurysm, your age, and risk factors for rupture. Medication to control blood pressure and pain may be used to manage aneurysms smaller than 6 cm. Regular monitoring for enlargement may be recommended by your caregiver if:  The aneurysm is 3-4 cm in size (an annual ultrasonography may be recommended).  The aneurysm is 4-4.5 cm in size (an ultrasonography every 6 months may be recommended).  The aneurysm is larger than 4.5 cm in   size (your caregiver may ask that you be examined by a vascular surgeon). If your aneurysm is larger than 6 cm, surgical repair may be recommended. There are two main methods for repair of an aneurysm:   Endovascular  repair (a minimally invasive surgery). This is done most often.  Open repair. This method is used if an endovascular repair is not possible.   This information is not intended to replace advice given to you by your health care provider. Make sure you discuss any questions you have with your health care provider.   Document Released: 07/27/2005 Document Revised: 02/11/2013 Document Reviewed: 11/16/2012 Elsevier Interactive Patient Education [employer].   Before your next abdominal ultrasound:  Take two Extra-Strength Gas-X capsules at bedtime the night before the test. Take another two Extra-Strength Gas-X capsules 3 hours before the test.      Steps to Quit Smoking  Smoking tobacco can be harmful to your health and can affect almost every organ in your body. Smoking puts you, and those around you, at risk for developing many serious chronic diseases. Quitting smoking is difficult, but it is one of the best things that you can do for your health. It is never too late to quit. WHAT ARE THE BENEFITS OF QUITTING SMOKING? When you quit smoking, you lower your risk of developing serious diseases and conditions, such as:  Lung cancer or lung disease, such as COPD.  Heart disease.  Stroke.  Heart attack.  Infertility.  Osteoporosis and bone fractures. Additionally, symptoms such as coughing, wheezing, and shortness of breath may get better when you quit. You may also find that you get sick less often because your body is stronger at fighting off colds and infections. If you are pregnant, quitting smoking can help to reduce your chances of having a baby of low birth weight. HOW DO I GET READY TO QUIT? When you decide to quit smoking, create a plan to make sure that you are successful. Before you quit:  Pick a date to quit. Set a date within the next two weeks to give you time to prepare.  Write down the reasons why you are quitting. Keep this list in places where you will see  it often, such as on your bathroom mirror or in your car or wallet.  Identify the people, places, things, and activities that make you want to smoke (triggers) and avoid them. Make sure to take these actions:  Throw away all cigarettes at home, at work, and in your car.  Throw away smoking accessories, such as Scientist, research (medical).  Clean your car and make sure to empty the ashtray.  Clean your home, including curtains and carpets.  Tell your family, friends, and coworkers that you are quitting. Support from your loved ones can make quitting easier.  Talk with your health care provider about your options for quitting smoking.  Find out what treatment options are covered by your health insurance. WHAT STRATEGIES CAN I USE TO QUIT SMOKING?  Talk with your healthcare provider about different strategies to quit smoking. Some strategies include:  Quitting smoking altogether instead of gradually lessening how much you smoke over a period of time. Research shows that quitting "cold [place]" is more successful than gradually quitting.  Attending in-person counseling to help you build problem-solving skills. You are more likely to have success in quitting if you attend several counseling sessions. Even short sessions of 10 minutes can be effective.  Finding resources and support systems that can  help you to quit smoking and remain smoke-free after you quit. These resources are most helpful when you use them often. They can include:  Online chats with a Social worker.  Telephone quitlines.  Printed Furniture conservator/restorer.  Support groups or group counseling.  Text messaging programs.  Mobile phone applications.  Taking medicines to help you quit smoking. (If you are pregnant or breastfeeding, talk with your health care provider first.) Some medicines contain nicotine and some do not. Both types of medicines help with cravings, but the medicines that include nicotine help to relieve withdrawal  symptoms. Your health care provider may recommend:  Nicotine patches, gum, or lozenges.  Nicotine inhalers or sprays.  Non-nicotine medicine that is taken by mouth. Talk with your health care provider about combining strategies, such as taking medicines while you are also receiving in-person counseling. Using these two strategies together makes you more likely to succeed in quitting than if you used either strategy on its own. If you are pregnant or breastfeeding, talk with your health care provider about finding counseling or other support strategies to quit smoking. Do not take medicine to help you quit smoking unless told to do so by your health care provider. WHAT THINGS CAN I DO TO MAKE IT EASIER TO QUIT? Quitting smoking might feel overwhelming at first, but there is a lot that you can do to make it easier. Take these important actions:  Reach out to your family and friends and ask that they support and encourage you during this time. Call telephone quitlines, reach out to support groups, or work with a counselor for support.  Ask people who smoke to avoid smoking around you.  Avoid places that trigger you to smoke, such as bars, parties, or smoke-break areas at work.  Spend time around people who do not smoke.  Lessen stress in your life, because stress can be a smoking trigger for some people. To lessen stress, try:  Exercising regularly.  Deep-breathing exercises.  Yoga.  Meditating.  Performing a body scan. This involves closing your eyes, scanning your body from head to toe, and noticing which parts of your body are particularly tense. Purposefully relax the muscles in those areas.  Download or purchase mobile phone or tablet apps (applications) that can help you stick to your quit plan by providing reminders, tips, and encouragement. There are many free apps, such as QuitGuide from the [employer] Office manager for Disease Control and Prevention). You can find other support for  quitting smoking (smoking cessation) through smokefree.gov and other websites. HOW WILL I FEEL WHEN I QUIT SMOKING? Within the first 24 hours of quitting smoking, you may start to feel some withdrawal symptoms. These symptoms are usually most noticeable 2-3 days after quitting, but they usually do not last beyond 2-3 weeks. Changes or symptoms that you might experience include:  Mood swings.  Restlessness, anxiety, or irritation.  Difficulty concentrating.  Dizziness.  Strong cravings for sugary foods in addition to nicotine.  Mild weight gain.  Constipation.  Nausea.  Coughing or a sore throat.  Changes in how your medicines work in your body.  A depressed mood.  Difficulty sleeping (insomnia). After the first 2-3 weeks of quitting, you may start to notice more positive results, such as:  Improved sense of smell and taste.  Decreased coughing and sore throat.  Slower heart rate.  Lower blood pressure.  Clearer skin.  The ability to breathe more easily.  Fewer sick days. Quitting smoking is very challenging for most people.  Do not get discouraged if you are not successful the first time. Some people need to make many attempts to quit before they achieve long-term success. Do your best to stick to your quit plan, and talk with your health care provider if you have any questions or concerns.   This information is not intended to replace advice given to you by your health care provider. Make sure you discuss any questions you have with your health care provider.   Document Released: 10/11/2001 Document Revised: 03/03/2015 Document Reviewed: 03/03/2015 Elsevier Interactive Patient Education 2016 Elsevier Inc.     Peripheral Vascular Disease Peripheral vascular disease (PVD) is a disease of the blood vessels that are not part of your heart and brain. A simple term for PVD is poor circulation. In most cases, PVD narrows the blood vessels that carry blood from your  heart to the rest of your body. This can result in a decreased supply of blood to your arms, legs, and internal organs, like your stomach or kidneys. However, it most often affects a person's lower legs and feet. There are two types of PVD. Organic PVD. This is the more common type. It is caused by damage to the structure of blood vessels. Functional PVD. This is caused by conditions that make blood vessels contract and tighten (spasm). Without treatment, PVD tends to get worse over time. PVD can also lead to acute ischemic limb. This is when an arm or limb suddenly has trouble getting enough blood. This is a medical emergency. CAUSES Each type of PVD has many different causes. The most common cause of PVD is buildup of a fatty material (plaque) inside of your arteries (atherosclerosis). Small amounts of plaque can break off from the walls of the blood vessels and become lodged in a smaller artery. This blocks blood flow and can cause acute ischemic limb. Other common causes of PVD include: Blood clots that form inside of blood vessels. Injuries to blood vessels. Diseases that cause inflammation of blood vessels or cause blood vessel spasms. Health behaviors and health history that increase your risk of developing PVD. RISK FACTORS  You may have a greater risk of PVD if you: Have a family history of PVD. Have certain medical conditions, including: High cholesterol. Diabetes. High blood pressure (hypertension). Coronary heart disease. Past problems with blood clots. Past injury, such as burns or a broken bone. These may have damaged blood vessels in your limbs. Buerger disease. This is caused by inflamed blood vessels in your hands and feet. Some forms of arthritis. Rare birth defects that affect the arteries in your legs. Use tobacco. Do not get enough exercise. Are obese. Are age 10 or older. SIGNS AND SYMPTOMS  PVD may cause many different symptoms. Your symptoms depend on what part  of your body is not getting enough blood. Some common signs and symptoms include: Cramps in your lower legs. This may be a symptom of poor leg circulation (claudication). Pain and weakness in your legs while you are physically active that goes away when you rest (intermittent claudication). Leg pain when at rest. Leg numbness, tingling, or weakness. Coldness in a leg or foot, especially when compared with the other leg. Skin or hair changes. These can include: Hair loss. Shiny skin. Pale or bluish skin. Thick toenails. Inability to get or maintain an erection (erectile dysfunction). People with PVD are more prone to developing ulcers and sores on their toes, feet, or legs. These may take longer than normal to heal. DIAGNOSIS  Your health care provider may diagnose PVD from your signs and symptoms. The health care provider will also do a physical exam. You may have tests to find out what is causing your PVD and determine its severity. Tests may include: Blood pressure recordings from your arms and legs and measurements of the strength of your pulses (pulse volume recordings). Imaging studies using sound waves to take pictures of the blood flow through your blood vessels (Doppler ultrasound). Injecting a dye into your blood vessels before having imaging studies using: X-rays (angiogram or arteriogram). Computer-generated X-rays (CT angiogram). A powerful electromagnetic field and a computer (magnetic resonance angiogram or MRA). TREATMENT Treatment for PVD depends on the cause of your condition and the severity of your symptoms. It also depends on your age. Underlying causes need to be treated and controlled. These include long-lasting (chronic) conditions, such as diabetes, high cholesterol, and high blood pressure. You may need to first try making lifestyle changes and taking medicines. Surgery may be needed if these do not work. Lifestyle changes may include: Quitting smoking. Exercising  regularly. Following a low-fat, low-cholesterol diet. Medicines may include: Blood thinners to prevent blood clots. Medicines to improve blood flow. Medicines to improve your blood cholesterol levels. Surgical procedures may include: A procedure that uses an inflated balloon to open a blocked artery and improve blood flow (angioplasty). A procedure to put in a tube (stent) to keep a blocked artery open (stent implant). Surgery to reroute blood flow around a blocked artery (peripheral bypass surgery). Surgery to remove dead tissue from an infected wound on the affected limb. Amputation. This is surgical removal of the affected limb. This may be necessary in cases of acute ischemic limb that are not improved through medical or surgical treatments. HOME CARE INSTRUCTIONS Take medicines only as directed by your health care provider. Do not use any tobacco products, including cigarettes, chewing tobacco, or electronic cigarettes. If you need help quitting, ask your health care provider. Lose weight if you are overweight, and maintain a healthy weight as directed by your health care provider. Eat a diet that is low in fat and cholesterol. If you need help, ask your health care provider. Exercise regularly. Ask your health care provider to suggest some good activities for you. Use compression stockings or other mechanical devices as directed by your health care provider. Take good care of your feet. Wear comfortable shoes that fit well. Check your feet often for any cuts or sores. SEEK MEDICAL CARE IF: You have cramps in your legs while walking. You have leg pain when you are at rest. You have coldness in a leg or foot. Your skin changes. You have erectile dysfunction. You have cuts or sores on your feet that are not healing. SEEK IMMEDIATE MEDICAL CARE IF: Your arm or leg turns cold and blue. Your arms or legs become red, warm, swollen, painful, or numb. You have chest pain or trouble  breathing. You suddenly have weakness in your face, arm, or leg. You become very confused or lose the ability to speak. You suddenly have a very bad headache or lose your vision.   This information is not intended to replace advice given to you by your health care provider. Make sure you discuss any questions you have with your health care provider.   Document Released: 11/24/2004 Document Revised: 11/07/2014 Document Reviewed: 03/27/2014 Elsevier Interactive Patient Education [employer].

## 2016-07-26 ENCOUNTER — Ambulatory Visit (HOSPITAL_COMMUNITY)
Admission: RE | Admit: 2016-07-26 | Discharge: 2016-07-26 | Disposition: A | Discharge status: Home / Self Care | Payer: Medicare Other | Source: Ambulatory Visit | Attending: Vascular Surgery | Admitting: Vascular Surgery

## 2016-07-26 ENCOUNTER — Encounter: Payer: Self-pay | Admitting: Vascular Surgery

## 2016-07-26 ENCOUNTER — Other Ambulatory Visit: Payer: Self-pay | Admitting: *Deleted

## 2016-07-26 ENCOUNTER — Ambulatory Visit (INDEPENDENT_AMBULATORY_CARE_PROVIDER_SITE_OTHER): Payer: Medicare Other | Admitting: Vascular Surgery

## 2016-07-26 ENCOUNTER — Ambulatory Visit: Payer: Medicare Other | Admitting: Vascular Surgery

## 2016-07-26 VITALS — BP 120/70 | HR 60 | Temp 98.2°F | Resp 18 | Ht 73.0 in | Wt 180.7 lb

## 2016-07-26 DIAGNOSIS — I739 Peripheral vascular disease, unspecified: Secondary | ICD-10-CM | POA: Diagnosis not present

## 2016-07-26 DIAGNOSIS — I70219 Atherosclerosis of native arteries of extremities with intermittent claudication, unspecified extremity: Secondary | ICD-10-CM

## 2016-07-26 NOTE — Progress Notes (Signed)
Patient name: [name] MRN: [id number] DOB: 1935-04-20 Sex: male  REASON FOR VISIT: Evaluation  numbness and pain in his left foot   HPI: [name] is a 81 y.o. male the long history of peripheral vascular occlusive disease. He is status post bilateral iliac stenting by myself in May 2016. He says when he underwent bilateral common femoral endarterectomy given seen in our office in routine follow-up on 06/28/2016. His ankle arm indices were stable at that time. He does have calcified vessels making the somewhat unreliable. He was walking miles on a track 3 days a week. He called today and reported that he was having pain and numbness in his left foot and that he had fallen in the past 2 weeks. Seen to rule out new acute arterial insufficiency. His main complaint is of numbness from his knee distally in his left foot. He reports that he has severe degenerative disc disease in his back and has been told that he is not an operative candidate. Does report some pain on the left leg 2. No similar symptoms on the right  Past Medical History:  Diagnosis Date  . Arthritis   . CAD (coronary artery disease)   . Cataract   . Diabetes mellitus   . GERD (gastroesophageal reflux disease)   . Helicobacter pylori gastritis 01/2006, 03/2011   EGD - Pylera Tx 2010  . Hiccups   . History of kidney stones   . Hyperlipidemia   . Hypertension   . PAD (peripheral artery disease) (HCC)     Family History  Problem Relation Age of Onset  . Heart disease Father   . Diabetes Father   . Heart attack Father   . Hypertension Father   . Varicose Veins Father   . Cancer Sister     colon  . Diabetes Sister   . Other Sister     varicose veins  . Heart attack Sister   . Heart disease Sister   . Hyperlipidemia Sister   . Hypertension Sister   . Varicose Veins Sister   . Cancer Brother     prostate  . Diabetes Brother   . Heart disease Brother   . Hyperlipidemia Brother   . Hypertension Brother   .  Other Brother     varicose veins  . Heart attack Brother   . Varicose Veins Brother   . Heart disease Daughter   . Hyperlipidemia Daughter   . Hypertension Daughter   . Cancer Sister     lukemia  . Colon cancer Neg Hx     SOCIAL HISTORY: Social History  Substance Use Topics  . Smoking status: Current Every Day Smoker    Packs/day: 0.50    Years: 40.00    Types: Cigarettes  . Smokeless tobacco: Never Used  . Alcohol use No    Allergies  Allergen Reactions  . Sulfonamide Derivatives Hives    Current Outpatient Prescriptions  Medication Sig Dispense Refill  . aspirin 81 MG tablet Take 81 mg by mouth daily.      . Coenzyme Q10 (CO Q-10 PO) Take 300 mg by mouth 2 (two) times a week. Take on Mon and Fri.    . metFORMIN (GLUCOPHAGE) 1000 MG tablet Take 1 tablet by mouth Daily.     . metoprolol succinate (TOPROL-XL) 25 MG 24 hr tablet Take 25 mg by mouth daily.    . ramipril (ALTACE) 5 MG capsule Take 1 tablet by mouth Daily.  No current facility-administered medications for this visit.     REVIEW OF SYSTEMS:  [X]  denotes positive finding, [ ]  denotes negative finding Cardiac  Comments:  Chest pain or chest pressure:    Shortness of breath upon exertion:    Short of breath when lying flat:    Irregular heart rhythm:        Vascular    Pain in calf, thigh, or hip brought on by ambulation:    Pain in feet at night that wakes you up from your sleep:     Blood clot in your veins:    Leg swelling:         Pulmonary    Oxygen at home:    Productive cough:     Wheezing:         Neurologic    Sudden weakness in arms or legs:     Sudden numbness in arms or legs:     Sudden onset of difficulty speaking or slurred speech:    Temporary loss of vision in one eye:     Problems with dizziness:         Gastrointestinal    Blood in stool:     Vomited blood:         Genitourinary    Burning when urinating:     Blood in urine:        Psychiatric    Major depression:          Hematologic    Bleeding problems:    Problems with blood clotting too easily:        Skin    Rashes or ulcers:        Constitutional    Fever or chills:      PHYSICAL EXAM: Vitals:   07/26/16 1531  BP: 120/70  Pulse: 60  Resp: 18  Temp: 98.2 F (36.8 C)  TempSrc: Oral  SpO2: 100%  Weight: 82 kg (180 lb 11.2 oz)  Height: 6\' 1"  (1.854 m)    GENERAL: The patient is a well-nourished male, in no acute distress. The vital signs are documented above.   VASCULAR: 2+ femoral pulses bilaterally. Absent distal pulses. PULMONARY: There is good air exchange ABDOMEN: Soft and non-tender with no masses MUSCULOSKELETAL: There are no major deformities or cyanosis. NEUROLOGIC: Motor intact in both lower extremities. Decreased sensation in the left foot SKIN: There are no ulcers or rashes noted. Feet are warm bilaterally PSYCHIATRIC: The patient has a normal affect.  DATA:   He underwent repeat noninvasive studies in our office today and this is no change since his study in 06/28/2016. He has falsely elevated pressures related to calcification of 0.8 on the right and 0.87 on the left he does have monophasic waveforms in the posterior tibial dorsalis pedis bilaterally.  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 Does not have any evidence that would suggest acute ischemia as the cause for his neurologic change. I suspect this is more related to primary neurologic issue possibly related to degenerative disc disease. He was comfortable with this information and will touch base with [name] for further direction will see him as scheduled for protocol follow-up of his arterial insufficiency    Lilia Letterman [facility] [phone]

## 2016-08-16 DIAGNOSIS — Z23 Encounter for immunization: Secondary | ICD-10-CM | POA: Diagnosis not present

## 2016-09-28 DIAGNOSIS — L720 Epidermal cyst: Secondary | ICD-10-CM | POA: Diagnosis not present

## 2016-09-28 DIAGNOSIS — B351 Tinea unguium: Secondary | ICD-10-CM | POA: Diagnosis not present

## 2016-09-28 DIAGNOSIS — E1151 Type 2 diabetes mellitus with diabetic peripheral angiopathy without gangrene: Secondary | ICD-10-CM | POA: Diagnosis not present

## 2016-09-28 DIAGNOSIS — M79605 Pain in left leg: Secondary | ICD-10-CM | POA: Diagnosis not present

## 2016-09-28 DIAGNOSIS — R5383 Other fatigue: Secondary | ICD-10-CM | POA: Diagnosis not present

## 2016-09-28 DIAGNOSIS — E1149 Type 2 diabetes mellitus with other diabetic neurological complication: Secondary | ICD-10-CM | POA: Diagnosis not present

## 2016-09-28 DIAGNOSIS — D225 Melanocytic nevi of trunk: Secondary | ICD-10-CM | POA: Diagnosis not present

## 2016-09-28 DIAGNOSIS — I2510 Atherosclerotic heart disease of native coronary artery without angina pectoris: Secondary | ICD-10-CM | POA: Diagnosis not present

## 2016-09-28 DIAGNOSIS — L821 Other seborrheic keratosis: Secondary | ICD-10-CM | POA: Diagnosis not present

## 2016-09-28 DIAGNOSIS — D2262 Melanocytic nevi of left upper limb, including shoulder: Secondary | ICD-10-CM | POA: Diagnosis not present

## 2016-09-28 DIAGNOSIS — L700 Acne vulgaris: Secondary | ICD-10-CM | POA: Diagnosis not present

## 2016-09-28 DIAGNOSIS — E784 Other hyperlipidemia: Secondary | ICD-10-CM | POA: Diagnosis not present

## 2016-09-28 DIAGNOSIS — D692 Other nonthrombocytopenic purpura: Secondary | ICD-10-CM | POA: Diagnosis not present

## 2016-09-28 DIAGNOSIS — I10 Essential (primary) hypertension: Secondary | ICD-10-CM | POA: Diagnosis not present

## 2016-09-28 DIAGNOSIS — Z6825 Body mass index (BMI) 25.0-25.9, adult: Secondary | ICD-10-CM | POA: Diagnosis not present

## 2016-09-28 DIAGNOSIS — Z85828 Personal history of other malignant neoplasm of skin: Secondary | ICD-10-CM | POA: Diagnosis not present

## 2016-09-28 DIAGNOSIS — I7389 Other specified peripheral vascular diseases: Secondary | ICD-10-CM | POA: Diagnosis not present

## 2016-09-29 DIAGNOSIS — M79605 Pain in left leg: Secondary | ICD-10-CM | POA: Diagnosis not present

## 2016-09-29 DIAGNOSIS — E784 Other hyperlipidemia: Secondary | ICD-10-CM | POA: Diagnosis not present

## 2016-09-29 DIAGNOSIS — I10 Essential (primary) hypertension: Secondary | ICD-10-CM | POA: Diagnosis not present

## 2016-09-29 DIAGNOSIS — Z6825 Body mass index (BMI) 25.0-25.9, adult: Secondary | ICD-10-CM | POA: Diagnosis not present

## 2016-09-29 DIAGNOSIS — E1151 Type 2 diabetes mellitus with diabetic peripheral angiopathy without gangrene: Secondary | ICD-10-CM | POA: Diagnosis not present

## 2016-09-29 DIAGNOSIS — E1149 Type 2 diabetes mellitus with other diabetic neurological complication: Secondary | ICD-10-CM | POA: Diagnosis not present

## 2016-09-29 DIAGNOSIS — I7389 Other specified peripheral vascular diseases: Secondary | ICD-10-CM | POA: Diagnosis not present

## 2016-09-29 DIAGNOSIS — Z955 Presence of coronary angioplasty implant and graft: Secondary | ICD-10-CM | POA: Diagnosis not present

## 2016-09-29 DIAGNOSIS — R5383 Other fatigue: Secondary | ICD-10-CM | POA: Diagnosis not present

## 2016-09-29 DIAGNOSIS — I2510 Atherosclerotic heart disease of native coronary artery without angina pectoris: Secondary | ICD-10-CM | POA: Diagnosis not present

## 2016-09-29 DIAGNOSIS — M48061 Spinal stenosis, lumbar region without neurogenic claudication: Secondary | ICD-10-CM | POA: Diagnosis not present

## 2016-10-06 DIAGNOSIS — M4310 Spondylolisthesis, site unspecified: Secondary | ICD-10-CM | POA: Diagnosis not present

## 2016-10-06 DIAGNOSIS — M5440 Lumbago with sciatica, unspecified side: Secondary | ICD-10-CM | POA: Diagnosis not present

## 2016-10-06 DIAGNOSIS — M545 Low back pain: Secondary | ICD-10-CM | POA: Diagnosis not present

## 2016-11-03 DIAGNOSIS — Z6824 Body mass index (BMI) 24.0-24.9, adult: Secondary | ICD-10-CM | POA: Diagnosis not present

## 2016-11-03 DIAGNOSIS — E1149 Type 2 diabetes mellitus with other diabetic neurological complication: Secondary | ICD-10-CM | POA: Diagnosis not present

## 2016-11-03 DIAGNOSIS — I10 Essential (primary) hypertension: Secondary | ICD-10-CM | POA: Diagnosis not present

## 2016-11-03 DIAGNOSIS — Z955 Presence of coronary angioplasty implant and graft: Secondary | ICD-10-CM | POA: Diagnosis not present

## 2016-11-03 DIAGNOSIS — I739 Peripheral vascular disease, unspecified: Secondary | ICD-10-CM | POA: Diagnosis not present

## 2016-11-03 DIAGNOSIS — E784 Other hyperlipidemia: Secondary | ICD-10-CM | POA: Diagnosis not present

## 2016-11-03 DIAGNOSIS — I2510 Atherosclerotic heart disease of native coronary artery without angina pectoris: Secondary | ICD-10-CM | POA: Diagnosis not present

## 2016-11-08 ENCOUNTER — Other Ambulatory Visit: Payer: Self-pay | Admitting: Internal Medicine

## 2016-11-08 DIAGNOSIS — I7389 Other specified peripheral vascular diseases: Secondary | ICD-10-CM | POA: Diagnosis not present

## 2016-11-08 DIAGNOSIS — G6289 Other specified polyneuropathies: Secondary | ICD-10-CM | POA: Diagnosis not present

## 2016-11-08 DIAGNOSIS — F17200 Nicotine dependence, unspecified, uncomplicated: Secondary | ICD-10-CM | POA: Diagnosis not present

## 2016-11-08 DIAGNOSIS — Z0000 Encounter for general adult medical examination without abnormal findings: Secondary | ICD-10-CM | POA: Diagnosis not present

## 2016-11-08 DIAGNOSIS — M6281 Muscle weakness (generalized): Secondary | ICD-10-CM | POA: Diagnosis not present

## 2016-11-08 DIAGNOSIS — I2510 Atherosclerotic heart disease of native coronary artery without angina pectoris: Secondary | ICD-10-CM | POA: Diagnosis not present

## 2016-11-08 DIAGNOSIS — Z1389 Encounter for screening for other disorder: Secondary | ICD-10-CM | POA: Diagnosis not present

## 2016-11-08 DIAGNOSIS — G8310 Monoplegia of lower limb affecting unspecified side: Secondary | ICD-10-CM | POA: Diagnosis not present

## 2016-11-08 DIAGNOSIS — I10 Essential (primary) hypertension: Secondary | ICD-10-CM | POA: Diagnosis not present

## 2016-11-08 DIAGNOSIS — E1151 Type 2 diabetes mellitus with diabetic peripheral angiopathy without gangrene: Secondary | ICD-10-CM | POA: Diagnosis not present

## 2016-11-08 DIAGNOSIS — E784 Other hyperlipidemia: Secondary | ICD-10-CM | POA: Diagnosis not present

## 2016-11-08 DIAGNOSIS — R29898 Other symptoms and signs involving the musculoskeletal system: Secondary | ICD-10-CM

## 2016-11-08 DIAGNOSIS — Z6824 Body mass index (BMI) 24.0-24.9, adult: Secondary | ICD-10-CM | POA: Diagnosis not present

## 2016-11-18 ENCOUNTER — Ambulatory Visit
Admission: RE | Admit: 2016-11-18 | Discharge: 2016-11-18 | Disposition: A | Discharge status: Home / Self Care | Payer: Medicare Other | Source: Ambulatory Visit | Attending: Internal Medicine | Admitting: Internal Medicine

## 2016-11-18 DIAGNOSIS — R29898 Other symptoms and signs involving the musculoskeletal system: Secondary | ICD-10-CM

## 2016-11-18 DIAGNOSIS — R531 Weakness: Secondary | ICD-10-CM | POA: Diagnosis not present

## 2016-11-18 MED ORDER — GADOBENATE DIMEGLUMINE 529 MG/ML IV SOLN
20.0000 mL | Freq: Once | INTRAVENOUS | Status: AC | PRN
  Administered 2016-11-18: 16 mL via INTRAVENOUS

## 2016-12-07 NOTE — Progress Notes (Signed)
[name] Date of Birth: [birth year] Medical Record [id number]  History of Present Illness: [name] is seen for follow up CAD. He presented with unstable angina in 2009 and cardiac catheterization demonstrated severe left main and three-vessel coronary disease. He underwent coronary bypass surgery by Dr. Roxan Hockey. Since then he has done very well from a cardiac standpoint.  He has been active.  He is intolerant of statins due to myalgias. Also intolerant to Zetia. In April 2016 he was seen by Dr Early for increased claudication. He underwent bilateral femoral endarterectomy and patch angioplasty and bilateral iliac stenting on 03/23/2015. He is followed by Dr. Donnetta Hutching.  On follow up today he states he is doing very well. He denies any chest pain or SOB. Admits that he is not exercising regularly. He has stable claudication. Able to walk 30 minutes before cramping/pain in left calf. No other health concerns.  Current Outpatient Prescriptions on File Prior to Visit  Medication Sig Dispense Refill  . aspirin 81 MG tablet Take 81 mg by mouth daily.      . Coenzyme Q10 (CO Q-10 PO) Take 300 mg by mouth 2 (two) times a week. Take on Mon and Fri.    . metFORMIN (GLUCOPHAGE) 1000 MG tablet Take 1 tablet by mouth Daily.     . metoprolol succinate (TOPROL-XL) 25 MG 24 hr tablet Take 25 mg by mouth daily.    . ramipril (ALTACE) 5 MG capsule Take 1 tablet by mouth Daily.      No current facility-administered medications on file prior to visit.     Allergies  Allergen Reactions  . Sulfonamide Derivatives Hives    Past Medical History:  Diagnosis Date  . Arthritis   . CAD (coronary artery disease)   . Cataract   . Diabetes mellitus   . GERD (gastroesophageal reflux disease)   . Helicobacter pylori gastritis 01/2006, 03/2011   EGD - Pylera Tx 2010  . Hiccups   . History of kidney stones   . Hyperlipidemia   . Hypertension   . PAD (peripheral artery disease) ([place])     Past Surgical  History:  Procedure Laterality Date  . CATARACT EXTRACTION     right  . COLONOSCOPY  2002, 2007, 06/09/2011   Dr. Evelina Bucy - [name], [place]: for FHx colon cancer, no adenomas; 2012: [facility] - normal  . CORONARY ARTERY BYPASS GRAFT  05-07-2008  . ENDARTERECTOMY FEMORAL Bilateral 03/23/2015   Procedure: ENDARTERECTOMY OF BILATERAL COMMON FEMORAL AND PROFUNDA ARTERIES;  Surgeon: Rosetta Posner, MD;  Location: [facility];  Service: Vascular;  Laterality: Bilateral;  . ESOPHAGOGASTRODUODENOSCOPY  02/20/06   Dr. Evelina Bucy, [name], [place]. pylori gastritis and GERD changes (pathology)  . EYE SURGERY Left    cataract  . HERNIA REPAIR  12/24/2010  . INSERTION OF ILIAC STENT Bilateral 03/23/2015   Procedure: LEFT EXTERNAL ILIAC AND BILATERAL COMMON ILIAC STENT;  Surgeon: Rosetta Posner, MD;  Location: [facility];  Service: Vascular;  Laterality: Bilateral;  . JOINT REPLACEMENT     right knee replacement  . PATCH ANGIOPLASTY Bilateral 03/23/2015   Procedure: PATCH ANGIOPLASTY OF BILATERAL COMMON FEMORAL ARTERIES;  Surgeon: Rosetta Posner, MD;  Location: [facility];  Service: Vascular;  Laterality: Bilateral;  . PERIPHERAL VASCULAR CATHETERIZATION N/A 03/11/2015   Procedure: Abdominal Aortogram w/Lower Extremity;  Surgeon: Rosetta Posner, MD;  Location: [facility];  Service: Cardiovascular;  Laterality: N/A;  . TOTAL KNEE ARTHROPLASTY  07-13-2012   right  . UPPER  GASTROINTESTINAL ENDOSCOPY  03/09/2009   Dr. Carlean Purl: H. pylori gastritis (Pylera Tx) and 2 cm hiatal hernia    History  Smoking Status  . Current Every Day Smoker  . Packs/day: 0.50  . Years: 40.00  . Types: Cigarettes  Smokeless Tobacco  . Never Used    History  Alcohol Use No    Family History  Problem Relation Age of Onset  . Heart disease Father   . Diabetes Father   . Heart attack Father   . Hypertension Father   . Varicose Veins Father   . Cancer Sister     colon  . Diabetes Sister   . Other Sister     varicose veins  . Heart attack  Sister   . Heart disease Sister   . Hyperlipidemia Sister   . Hypertension Sister   . Varicose Veins Sister   . Cancer Brother     prostate  . Diabetes Brother   . Heart disease Brother   . Hyperlipidemia Brother   . Hypertension Brother   . Other Brother     varicose veins  . Heart attack Brother   . Varicose Veins Brother   . Heart disease Daughter   . Hyperlipidemia Daughter   . Hypertension Daughter   . Cancer Sister     lukemia  . Colon cancer Neg Hx     Review of Systems: The review of systems is positive chronic back problems and imbalance.  All other systems were reviewed and are negative.  Physical Exam: BP 100/60   Pulse (!) 53   Ht 6' 1.5" (1.867 m)   Wt 180 lb 9.6 oz (81.9 kg)   BMI 23.50 kg/m  He is a pleasant, elderly white male. NAD HEENT: Normocephalic, atraumatic. Pupils are equal round and reactive to light accommodation. Sclera clear. Oropharynx is clear. Neck is supple without JVD, adenopathy, or thyromegaly. There is a right carotid bruit. Lungs: Clear Cardiovascular: Regular rate and rhythm, normal S1 and S2, no gallop, murmur, or click. PMI is normal. Abdomen: Soft and nontender. Normal bowel sounds. No masses or hepatosplenomegaly. Extremities: Pedal pulses are 1+ and symmetric. He has no edema. Skin: Warm and dry Neuro: Alert and oriented x3 cranial nerves II through XII are intact.  LABORATORY DATA:  Lab Results  Component Value Date   WBC 10.2 03/24/2015   HGB 12.3 (L) 03/24/2015   HCT 36.0 (L) 03/24/2015   PLT 175 03/24/2015   GLUCOSE 122 (H) 03/24/2015   ALT 14 (L) 03/20/2015   AST 22 03/20/2015   NA 130 (L) 03/24/2015   K 4.3 03/24/2015   CL 97 (L) 03/24/2015   CREATININE 0.74 03/24/2015   BUN 11 03/24/2015   CO2 26 03/24/2015   TSH 1.156 02/16/2015   INR 1.13 03/20/2015   HGBA1C 7.3 (H) 03/20/2015   Labs dated 11/03/16: cholesterol 190, triglycerides 104, HDL 47, LDL 122. A1c 6.5%. CMET and TSH normal.  ECG today  demonstrates sinus rhythm with a rate of 53 beats per minute with first-degree AV block. It is otherwise normal.I have personally reviewed and interpreted this study.    Assessment / Plan: 1. Coronary disease status post CABG in 2009. Currently asymptomatic.  Myoview study in 4/14 was low risk with a small fixed inferobasal defect. EF 64%. Continue aspirin, ACE inhibitor, and metoprolol.   2. Diabetes mellitus type 2. On metformin.  3. Hypertension, controlled.  4. Hyperlipidemia. Intolerant of statins and Zetia. Unable to afford PCSK 9 inhibitor. Will  see if he is a candidate for the [place] trial.   5. Peripheral arterial disease s/p bilateral femoral endarterectomy and stenting of bilateral iliac arteries in May 2016.  Recommend smoking cessation and regular/daily walking program.  6. AAA- small. 3.0 cm.  I will follow up in one year.

## 2016-12-09 ENCOUNTER — Ambulatory Visit (INDEPENDENT_AMBULATORY_CARE_PROVIDER_SITE_OTHER): Payer: Medicare Other | Admitting: Cardiology

## 2016-12-09 ENCOUNTER — Encounter: Payer: Self-pay | Admitting: Cardiology

## 2016-12-09 VITALS — BP 100/60 | HR 53 | Ht 73.5 in | Wt 180.6 lb

## 2016-12-09 DIAGNOSIS — I70213 Atherosclerosis of native arteries of extremities with intermittent claudication, bilateral legs: Secondary | ICD-10-CM

## 2016-12-09 DIAGNOSIS — Z951 Presence of aortocoronary bypass graft: Secondary | ICD-10-CM | POA: Diagnosis not present

## 2016-12-09 DIAGNOSIS — I714 Abdominal aortic aneurysm, without rupture, unspecified: Secondary | ICD-10-CM

## 2016-12-09 DIAGNOSIS — E1159 Type 2 diabetes mellitus with other circulatory complications: Secondary | ICD-10-CM

## 2016-12-09 DIAGNOSIS — I25810 Atherosclerosis of coronary artery bypass graft(s) without angina pectoris: Secondary | ICD-10-CM

## 2016-12-09 DIAGNOSIS — I739 Peripheral vascular disease, unspecified: Secondary | ICD-10-CM

## 2016-12-09 NOTE — Patient Instructions (Addendum)
Continue your current therapy  You need to walk daily  I will see you in one year

## 2016-12-12 DIAGNOSIS — G5761 Lesion of plantar nerve, right lower limb: Secondary | ICD-10-CM | POA: Diagnosis not present

## 2016-12-12 DIAGNOSIS — M7751 Other enthesopathy of right foot: Secondary | ICD-10-CM | POA: Diagnosis not present

## 2016-12-19 DIAGNOSIS — M7752 Other enthesopathy of left foot: Secondary | ICD-10-CM | POA: Diagnosis not present

## 2016-12-19 DIAGNOSIS — G5762 Lesion of plantar nerve, left lower limb: Secondary | ICD-10-CM | POA: Diagnosis not present

## 2017-01-03 DIAGNOSIS — G5761 Lesion of plantar nerve, right lower limb: Secondary | ICD-10-CM | POA: Diagnosis not present

## 2017-01-03 DIAGNOSIS — M7751 Other enthesopathy of right foot: Secondary | ICD-10-CM | POA: Diagnosis not present

## 2017-01-09 DIAGNOSIS — G5761 Lesion of plantar nerve, right lower limb: Secondary | ICD-10-CM | POA: Diagnosis not present

## 2017-01-09 DIAGNOSIS — M7751 Other enthesopathy of right foot: Secondary | ICD-10-CM | POA: Diagnosis not present

## 2017-03-08 DIAGNOSIS — M6281 Muscle weakness (generalized): Secondary | ICD-10-CM | POA: Diagnosis not present

## 2017-03-08 DIAGNOSIS — I7389 Other specified peripheral vascular diseases: Secondary | ICD-10-CM | POA: Diagnosis not present

## 2017-03-08 DIAGNOSIS — E1151 Type 2 diabetes mellitus with diabetic peripheral angiopathy without gangrene: Secondary | ICD-10-CM | POA: Diagnosis not present

## 2017-03-08 DIAGNOSIS — I498 Other specified cardiac arrhythmias: Secondary | ICD-10-CM | POA: Diagnosis not present

## 2017-03-08 DIAGNOSIS — I10 Essential (primary) hypertension: Secondary | ICD-10-CM | POA: Diagnosis not present

## 2017-03-08 DIAGNOSIS — Z6825 Body mass index (BMI) 25.0-25.9, adult: Secondary | ICD-10-CM | POA: Diagnosis not present

## 2017-03-08 DIAGNOSIS — I2510 Atherosclerotic heart disease of native coronary artery without angina pectoris: Secondary | ICD-10-CM | POA: Diagnosis not present

## 2017-03-08 DIAGNOSIS — R296 Repeated falls: Secondary | ICD-10-CM | POA: Diagnosis not present

## 2017-03-08 DIAGNOSIS — E1149 Type 2 diabetes mellitus with other diabetic neurological complication: Secondary | ICD-10-CM | POA: Diagnosis not present

## 2017-03-13 DIAGNOSIS — G5761 Lesion of plantar nerve, right lower limb: Secondary | ICD-10-CM | POA: Diagnosis not present

## 2017-03-13 DIAGNOSIS — M71571 Other bursitis, not elsewhere classified, right ankle and foot: Secondary | ICD-10-CM | POA: Diagnosis not present

## 2017-03-13 DIAGNOSIS — M7751 Other enthesopathy of right foot: Secondary | ICD-10-CM | POA: Diagnosis not present

## 2017-03-20 DIAGNOSIS — M71571 Other bursitis, not elsewhere classified, right ankle and foot: Secondary | ICD-10-CM | POA: Diagnosis not present

## 2017-03-20 DIAGNOSIS — M7751 Other enthesopathy of right foot: Secondary | ICD-10-CM | POA: Diagnosis not present

## 2017-03-21 ENCOUNTER — Ambulatory Visit: Payer: Medicare Other | Admitting: Physical Therapy

## 2017-03-24 ENCOUNTER — Ambulatory Visit: Payer: Medicare Other | Attending: Internal Medicine | Admitting: Physical Therapy

## 2017-03-24 DIAGNOSIS — M6281 Muscle weakness (generalized): Secondary | ICD-10-CM

## 2017-03-24 DIAGNOSIS — R2681 Unsteadiness on feet: Secondary | ICD-10-CM | POA: Diagnosis not present

## 2017-03-24 DIAGNOSIS — R2689 Other abnormalities of gait and mobility: Secondary | ICD-10-CM | POA: Insufficient documentation

## 2017-03-25 NOTE — Therapy (Signed)
[facility] [address] [place], [place], [address] Phone: [phone]   Fax:  [phone]  Physical Therapy Evaluation  Patient Details  Name: [name] MRN: [id number] Date of Birth: Sep 06, 1935 Referring Provider: [name], MD  Encounter Date: 03/24/2017      PT End of Session - 03/25/17 1137    Visit Number 1   Number of Visits 17   Date for PT Re-Evaluation 05/23/17   Authorization Type Medicare Primary, AARP 2nd-GCODE every 10th visit   PT Start Time 1104   PT Stop Time 1148   PT Time Calculation (min) 44 min   Activity Tolerance Patient tolerated treatment well  No increase in lower extremity pain noted during eval   Behavior During Therapy [facility] for tasks assessed/performed      Past Medical History:  Diagnosis Date  . Arthritis   . CAD (coronary artery disease)   . Cataract   . Diabetes mellitus   . GERD (gastroesophageal reflux disease)   . Helicobacter pylori gastritis 01/2006, 03/2011   EGD - Pylera Tx 2010  . Hiccups   . History of kidney stones   . Hyperlipidemia   . Hypertension   . PAD (peripheral artery disease) ([place])     Past Surgical History:  Procedure Laterality Date  . CATARACT EXTRACTION     right  . COLONOSCOPY  2002, 2007, 06/09/2011   Dr. Evelina Bucy - [name], [place]: for FHx colon cancer, no adenomas; 2012: [facility] - normal  . CORONARY ARTERY BYPASS GRAFT  05-07-2008  . ENDARTERECTOMY FEMORAL Bilateral 03/23/2015   Procedure: ENDARTERECTOMY OF BILATERAL COMMON FEMORAL AND PROFUNDA ARTERIES;  Surgeon: Rosetta Posner, MD;  Location: [facility];  Service: Vascular;  Laterality: Bilateral;  . ESOPHAGOGASTRODUODENOSCOPY  02/20/06   Dr. Evelina Bucy, [name], [place]. pylori gastritis and GERD changes (pathology)  . EYE SURGERY Left    cataract  . HERNIA REPAIR  12/24/2010  . INSERTION OF ILIAC STENT Bilateral 03/23/2015   Procedure: LEFT EXTERNAL ILIAC AND BILATERAL COMMON ILIAC STENT;  Surgeon:  Rosetta Posner, MD;  Location: [facility];  Service: Vascular;  Laterality: Bilateral;  . JOINT REPLACEMENT     right knee replacement  . PATCH ANGIOPLASTY Bilateral 03/23/2015   Procedure: PATCH ANGIOPLASTY OF BILATERAL COMMON FEMORAL ARTERIES;  Surgeon: Rosetta Posner, MD;  Location: [facility];  Service: Vascular;  Laterality: Bilateral;  . PERIPHERAL VASCULAR CATHETERIZATION N/A 03/11/2015   Procedure: Abdominal Aortogram w/Lower Extremity;  Surgeon: Rosetta Posner, MD;  Location: [facility];  Service: Cardiovascular;  Laterality: N/A;  . TOTAL KNEE ARTHROPLASTY  07-13-2012   right  . UPPER GASTROINTESTINAL ENDOSCOPY  03/09/2009   Dr. Carlean Purl: H. pylori gastritis (Pylera Tx) and 2 cm hiatal hernia    There were no vitals filed for this visit.       Subjective Assessment - 03/24/17 1107    Subjective Pt reports that [name] thought physical therapy might help.  He states, "my balance is not good."  Balance has been off for about 16 months-feels this is mainly due to back problems.  Feels he was better after the stent placement last year, but feels weak again.  Pt does not use assistive device-he reports being a "wall walker."  Reports 2 falls in the past month.   Patient is accompained by: Family member  wife   Pertinent History CAD, CABG, endarterectomy, GERD, DM, HTN   Currently in Pain? Yes   Pain Score 4  Pain Location Calf   Pain Orientation Right;Left   Pain Descriptors / Indicators Aching   Pain Type Chronic pain   Pain Onset More than a month ago   Pain Frequency Constant   Aggravating Factors  Walking too long (>10-15 minutes)   Pain Relieving Factors sitting   Effect of Pain on Daily Activities PT will monitor pain, but will not address as a goal at this time due to chronic nature of pain.            [facility] PT Assessment - 03/25/17 0001      Assessment   Medical Diagnosis increased weakness, falls   Referring Provider [name], MD   Onset Date/Surgical Date  03/14/17  stent placement in legs 1 year ago     Precautions   Precautions Fall     Balance Screen   Has the patient fallen in the past 6 months Yes   How many times? 2   Has the patient had a decrease in activity level because of a fear of falling?  Yes   Is the patient reluctant to leave their home because of a fear of falling?  No     Home Ecologist residence   Living Arrangements Spouse/significant other   Available Help at Discharge Family   Type of [place] to enter   Entrance Stairs-Number of Steps 3   Entrance Stairs-Rails [place] Two level;Able to live on main level with bedroom/bathroom   [place] - single point;Walker - 2 wheels     Prior Function   Level of Independence Independent with basic ADLs;Independent with household mobility without device   Leisure Enjoyed going to the gym 3x/week, but he doesn't do that anymore; used to trail walk     Observation/Other Assessments   Focus on Therapeutic Outcomes (FOTO)  Functional Status Intake score 36; Neuro QOL-31.3     ROM / Strength   AROM / PROM / Strength Strength     Strength   Overall Strength Deficits   Strength Assessment Site Hip;Knee;Ankle   Right/Left Hip Right;Left   Right Hip Flexion 4/5   Left Hip Flexion 3/5   Right/Left Knee Right;Left   Right Knee Flexion 3+/5   Right Knee Extension 4/5   Left Knee Flexion 3+/5   Left Knee Extension 4/5   Right/Left Ankle Right;Left   Right Ankle Dorsiflexion 3+/5   Left Ankle Dorsiflexion 3+/5     Transfers   Transfers Sit to Stand;Stand to Sit   Sit to Stand 6: Modified independent (Device/Increase time);5: Supervision;Without upper extremity assist;From chair/3-in-1   Five time sit to stand comments  Unable to complete-only able to stance once without UE support   Stand to Sit 5: Supervision;With upper extremity assist;Without upper extremity assist;To chair/3-in-1      Ambulation/Gait   Ambulation/Gait Yes   Ambulation/Gait Assistance 5: Supervision   Ambulation Distance (Feet) 250 Feet   Assistive device None   Gait Pattern Step-through pattern;Trendelenburg;Wide base of support   Ambulation Surface Level;Indoor   Gait velocity 10.10 sec = 3.25 ft/sec     Standardized Balance Assessment   Standardized Balance Assessment Timed Up and Go Test;Dynamic Gait Index;Berg Balance Test     Berg Balance Test   Sit to Stand Able to stand  independently using hands   Standing Unsupported Able to stand safely 2 minutes   Sitting with Back Unsupported but Feet Supported  on Floor or Stool Able to sit safely and securely 2 minutes   Stand to Sit Controls descent by using hands   Transfers Able to transfer safely, definite need of hands   Standing Unsupported with Eyes Closed Able to stand 10 seconds with supervision   Standing Ubsupported with Feet Together Able to place feet together independently and stand 1 minute safely   From Standing, Reach Forward with Outstretched Arm Can reach forward >12 cm safely (5")   From Standing Position, Pick up Object from [place] to pick up shoe safely and easily   From Standing Position, Turn to Look Behind Over each Shoulder Looks behind one side only/other side shows less weight shift   Turn 360 Degrees Able to turn 360 degrees safely but slowly  L 7.72, R 6.38 sec   Standing Unsupported, Alternately Place Feet on Step/Stool Needs assistance to keep from falling or unable to try   Standing Unsupported, One Foot in Front Needs help to step but can hold 15 seconds   Standing on One Leg Unable to try or needs assist to prevent fall   Total Score 37     Dynamic Gait Index   Level Surface Mild Impairment   Change in Gait Speed Normal   Gait with Horizontal Head Turns Mild Impairment   Gait with Vertical Head Turns Severe Impairment   Gait and Pivot Turn Moderate Impairment  3.75 sec- L foot catches with turn   Step Over  Obstacle Normal   Step Around Obstacles Mild Impairment   Steps Moderate Impairment   Total Score 14   DGI comment: Scores <19/24 indicate increased fall risk.     Timed Up and Go Test   Normal TUG (seconds) 19.91   TUG Comments Scores >13.5 sec indicate increased fall risk                             PT Short Term Goals - 03/25/17 1141      PT SHORT TERM GOAL #1   Title Pt will perform HEP with wife's supervision, for improved balance and gait.  TARGET 04/23/17   Time 4   Period Weeks   Status New     PT SHORT TERM GOAL #2   Title Pt will improve TUG score to less than or equal to 15 seconds for decreased fall risk.   Time 4   Period Weeks   Status New     PT SHORT TERM GOAL #3   Title Pt will improve Berg Balance test to at least 42/56 for decreased fall risk.   Time 4   Period Weeks   Status New     PT SHORT TERM GOAL #4   Title Pt will ambulate at least 15 minutes, indoor and outdoor surfaces (with appropriate device), for improved gait in community.   Time 4   Period Weeks   Status New           PT Long Term Goals - 03/25/17 1143      PT LONG TERM GOAL #1   Title Pt will verbalize understanding of fall prevention in home environment.  TARGET 05/23/17   Time 8   Period Weeks   Status New     PT LONG TERM GOAL #2   Title Pt will improve TUG score to less than or equal to 13.5 seconds for decreased fall risk.   Time 8   Period Weeks  Status New     PT LONG TERM GOAL #3   Title Pt will imrpove DGI score to at least 19/24 for decreased fall risk.   Time 8   Period Weeks   Status New     PT LONG TERM GOAL #4   Title Pt will improve Berg Balance score to at least 47/56 for decreased fall risk.   Time 8   Period Weeks   Status New     PT LONG TERM GOAL #5   Title Pt will report improvement in overall functional mobility and balance, as evidenced by improved FOTO status score by at least 10%.   Time 8   Period Weeks   Status New                Plan - 03/25/17 1138    Clinical Impression Statement Pt is an 83 year old male who presents to OP PT with history of balance issues, 2 falls in the past 6 months.  He has at least 3 co-morbidities.  He is at fall risk per TUG, DGI and Berg Balance scores.  He demo decreased LLE strength.  See below for additional deficits that pt would benefit from skilled PT to address to improve functional mobility and decreased fall risk.   Rehab Potential Good   PT Frequency 2x / week   PT Duration 8 weeks  plus eval   PT Treatment/Interventions ADLs/Self Care Home Management;Patient/family education;Functional mobility training;Gait training;Neuromuscular re-education;Balance training;Therapeutic exercise;Therapeutic activities   PT Next Visit Plan Initiate balance HEP, lower extremity strengthening HEP   Consulted and Agree with Plan of Care Patient;Family member/caregiver   Family Member Consulted wife      Patient will benefit from skilled therapeutic intervention in order to improve the following deficits and impairments:  Abnormal gait, Decreased balance, Decreased mobility, Decreased strength, Difficulty walking, Postural dysfunction  Visit Diagnosis: Other abnormalities of gait and mobility  Unsteadiness on feet  Muscle weakness (generalized)      G-Codes - April 01, 2017 1146    Functional Assessment Tool Used (Outpatient Only) TUG 19.91 sec, DGI 14/24; Neuro QOL 31.3, FOT intake 36; 37/56 Berg; 2 falls in past 6 months   Functional Limitation Mobility: Walking and moving around   Mobility: Walking and Moving Around Current Status [phone]) At least 40 percent but less than 60 percent impaired, limited or restricted   Mobility: Walking and Moving Around Goal Status [phone]) At least 20 percent but less than 40 percent impaired, limited or restricted       Problem List Patient Active Problem List   Diagnosis Date Noted  . Type 2 diabetes mellitus with vascular disease  ([place]) 11/04/2015  . Left carotid bruit 11/04/2015  . PVD with claudication - s/p PV surgery May 2016 03/23/2015  . Intractable hiccups 02/16/2015  . Dehydration 02/16/2015  . Hyponatremia 02/16/2015  . Perineal abscess 06/20/2013  . Hypertension   . Hx of CABG 2009   . Hyperlipidemia   . Chronic constipation 06/27/2012    Sheri Prows W. 03/25/2017, 11:47 AM  Frazier Butt., PT   [facility] [address] [place], [place], [address] Phone: [phone]   Fax:  [phone]  Name: [name] MRN: [id number] Date of Birth: 16-Apr-1935

## 2017-04-06 ENCOUNTER — Ambulatory Visit: Payer: Medicare Other | Attending: Internal Medicine | Admitting: Physical Therapy

## 2017-04-06 DIAGNOSIS — M5440 Lumbago with sciatica, unspecified side: Secondary | ICD-10-CM | POA: Diagnosis not present

## 2017-04-06 DIAGNOSIS — M6281 Muscle weakness (generalized): Secondary | ICD-10-CM | POA: Insufficient documentation

## 2017-04-06 DIAGNOSIS — R2689 Other abnormalities of gait and mobility: Secondary | ICD-10-CM | POA: Diagnosis not present

## 2017-04-06 DIAGNOSIS — M5442 Lumbago with sciatica, left side: Secondary | ICD-10-CM | POA: Diagnosis not present

## 2017-04-06 DIAGNOSIS — R2681 Unsteadiness on feet: Secondary | ICD-10-CM | POA: Diagnosis not present

## 2017-04-06 NOTE — Patient Instructions (Addendum)
Standing Marching   Using a chair if necessary, march in place. Repeat 10 times. Do 1-2 sessions per day.  http://gt2.exer.us/344   Copyright  VHI. All rights reserved.   Hip Backward Kick   Using a chair for balance, keep legs shoulder width apart and toes pointed for- ward. Slowly extend one leg back, keeping knee straight. Do not lean forward. Repeat with other leg. Repeat 10 times. Do 1-2 sessions per day.  http://gt2.exer.us/340   Copyright  VHI. All rights reserved.     Hip Side Kick   Holding a chair for balance, keep legs shoulder width apart and toes pointed forward. Swing a leg out to side, keeping knee straight. Do not lean. Repeat using other leg. Repeat 10 times. Do 1-2 sessions per day.  http://gt2.exer.us/342   Copyright  VHI. All rights reserved.   Feet Heel-Toe "Tandem", Varied Arm Positions - Eyes Open   With eyes open, right foot directly in front of the other, arms out, look straight ahead at a stationary object. Hold 10 seconds. Repeat 3  times per session. Do 1-2sessions per day.  Copyright  VHI. All rights reserved.       Standing On One Leg Without Support .  Stand on one leg in neutral spine without support. Hold 10 seconds. Repeat on other leg. Do 3 repetitions, 1-2 sets.  http://bt.exer.us/36   Copyright  VHI. All rights reserved.    Side-Stepping   Walk to left side with eyes open. Take even steps, leading with same foot. Make sure each foot lifts off the floor. Repeat in opposite direction. Repeat for 3-5 lengths of counter. Do 1-2 sessions per day.   Copyright  VHI. All rights reserved.         WALKING  Walking is a great form of exercise to increase your strength, endurance and overall fitness.  A walking program can help you start slowly and gradually build endurance as you go.  Everyone's ability is different, so each person's starting point will be different.  You do not have to follow them exactly.  The  are just samples. You should simply find out what's right for you and stick to that program.   In the beginning, you'll start off walking 2-3 times a day for short distances.  As you get stronger, you'll be walking further at just 1-2 times per day.  A. You Can Walk For A Certain Length Of Time Each Day    Walk 3 minutes 3 times per day.  Increase 1-2 minutes every 2-3 days (3 times per day).  Work up to 10-15 minutes (1-2 times per day).  Start off by doing this in your home, either in a hallway or a lap around rooms.

## 2017-04-06 NOTE — Therapy (Signed)
[facility] [address] [place], [place], [address] Phone: [phone]   Fax:  [phone]  Physical Therapy Treatment  Patient Details  Name: [name] MRN: [id number] Date of Birth: 1935-04-18 Referring Provider: [name], MD  Encounter Date: 04/06/2017      PT End of Session - 04/06/17 1248    Visit Number 2   Number of Visits 17   Date for PT Re-Evaluation 05/23/17   Authorization Type Medicare Primary, AARP 2nd-GCODE every 10th visit   PT Start Time [id number]  Late start due to previous eval   PT Stop Time 1014  Pt states several times he feels he's done for today-   PT Time Calculation (min) 36 min   Activity Tolerance Patient tolerated treatment well  No increase in lower extremity pain noted during eval   Behavior During Therapy [facility] for tasks assessed/performed      Past Medical History:  Diagnosis Date  . Arthritis   . CAD (coronary artery disease)   . Cataract   . Diabetes mellitus   . GERD (gastroesophageal reflux disease)   . Helicobacter pylori gastritis 01/2006, 03/2011   EGD - Pylera Tx 2010  . Hiccups   . History of kidney stones   . Hyperlipidemia   . Hypertension   . PAD (peripheral artery disease) ([place])     Past Surgical History:  Procedure Laterality Date  . CATARACT EXTRACTION     right  . COLONOSCOPY  2002, 2007, 06/09/2011   Dr. Evelina Bucy - [name], [place]: for FHx colon cancer, no adenomas; 2012: Gessner - normal  . CORONARY ARTERY BYPASS GRAFT  05-07-2008  . ENDARTERECTOMY FEMORAL Bilateral 03/23/2015   Procedure: ENDARTERECTOMY OF BILATERAL COMMON FEMORAL AND PROFUNDA ARTERIES;  Surgeon: Rosetta Posner, MD;  Location: [facility];  Service: Vascular;  Laterality: Bilateral;  . ESOPHAGOGASTRODUODENOSCOPY  02/20/06   Dr. Evelina Bucy, [name], [place]. pylori gastritis and GERD changes (pathology)  . EYE SURGERY Left    cataract  . HERNIA REPAIR  12/24/2010  . INSERTION OF ILIAC STENT Bilateral  03/23/2015   Procedure: LEFT EXTERNAL ILIAC AND BILATERAL COMMON ILIAC STENT;  Surgeon: Rosetta Posner, MD;  Location: [facility];  Service: Vascular;  Laterality: Bilateral;  . JOINT REPLACEMENT     right knee replacement  . PATCH ANGIOPLASTY Bilateral 03/23/2015   Procedure: PATCH ANGIOPLASTY OF BILATERAL COMMON FEMORAL ARTERIES;  Surgeon: Rosetta Posner, MD;  Location: [facility];  Service: Vascular;  Laterality: Bilateral;  . PERIPHERAL VASCULAR CATHETERIZATION N/A 03/11/2015   Procedure: Abdominal Aortogram w/Lower Extremity;  Surgeon: Rosetta Posner, MD;  Location: [facility];  Service: Cardiovascular;  Laterality: N/A;  . TOTAL KNEE ARTHROPLASTY  07-13-2012   right  . UPPER GASTROINTESTINAL ENDOSCOPY  03/09/2009   Dr. Carlean Purl: H. pylori gastritis (Pylera Tx) and 2 cm hiatal hernia    There were no vitals filed for this visit.      Subjective Assessment - 04/06/17 0940    Subjective no changes since eval.  No falls.  Always have pain in feet.   Patient is accompained by: Family member  wife   Pertinent History CAD, CABG, endarterectomy, GERD, DM, HTN   Currently in Pain? Yes   Pain Score 5    Pain Location Foot   Pain Orientation Right;Left   Pain Descriptors / Indicators Aching   Pain Onset More than a month ago   Aggravating Factors  walking too long   Pain Relieving  Factors [facility] Adult PT Treatment/Exercise - 04/06/17 0942      Ambulation/Gait   Ambulation/Gait Yes   Ambulation/Gait Assistance 5: Supervision   Ambulation Distance (Feet) 500 Feet  then 100 ft x 2   Assistive device None   Gait Pattern Step-through pattern;Trendelenburg;Wide base of support  veers to R and L; reaches out for walls   Ambulation Surface Level;Indoor   Gait Comments Pt walked 500 ft in 2:45.  Pt fatigued after this gait distance and time (no reports of increased of pain).  Discussed and provided information on walking program for home.     Exercises    Exercises Knee/Hip;Ankle     Knee/Hip Exercises: Seated   Long Arc Quad AROM;Strengthening;Right;Left;1 set;10 reps  Cues to slow pace and to control exercise   Heel Slides Strengthening;Right;Left;1 set;10 reps  resisted with green theraband   Marching Strengthening;Right;Left;1 set;10 reps  Cues for 3 sec hold; slow pace     Ankle Exercises: Seated   Heel Raises 10 reps;3 seconds   Toe Raise 10 reps;3 seconds             Balance Exercises - 04/06/17 1241      Balance Exercises: Standing   Tandem Gait Forward;Intermittent upper extremity support;2 reps  lengths of counter   Sidestepping Upper extremity support;2 reps  length of counter   Marching Limitations Marching in place x 10 reps   Other Standing Exercises Alternating hip kicks to the side x 10 reps, then to the back x 10 reps, with frequent cues for upright posture.  Alternating knee flexion (hamstring curls) x 10 reps, with cues for upright posture, all at counter with UE support           PT Education - 04/06/17 1247    Education provided Yes   Education Details HEP-balance (see instruction) and walking program progression   Person(s) Educated Patient   Methods Explanation;Demonstration;Handout   Comprehension Verbalized understanding;Returned demonstration;Verbal cues required          PT Short Term Goals - 03/25/17 1141      PT SHORT TERM GOAL #1   Title Pt will perform HEP with wife's supervision, for improved balance and gait.  TARGET 04/23/17   Time 4   Period Weeks   Status New     PT SHORT TERM GOAL #2   Title Pt will improve TUG score to less than or equal to 15 seconds for decreased fall risk.   Time 4   Period Weeks   Status New     PT SHORT TERM GOAL #3   Title Pt will improve Berg Balance test to at least 42/56 for decreased fall risk.   Time 4   Period Weeks   Status New     PT SHORT TERM GOAL #4   Title Pt will ambulate at least 15 minutes, indoor and outdoor surfaces (with  appropriate device), for improved gait in community.   Time 4   Period Weeks   Status New           PT Long Term Goals - 03/25/17 1143      PT LONG TERM GOAL #1   Title Pt will verbalize understanding of fall prevention in home environment.  TARGET 05/23/17   Time 8   Period Weeks   Status New  PT LONG TERM GOAL #2   Title Pt will improve TUG score to less than or equal to 13.5 seconds for decreased fall risk.   Time 8   Period Weeks   Status New     PT LONG TERM GOAL #3   Title Pt will imrpove DGI score to at least 19/24 for decreased fall risk.   Time 8   Period Weeks   Status New     PT LONG TERM GOAL #4   Title Pt will improve Berg Balance score to at least 47/56 for decreased fall risk.   Time 8   Period Weeks   Status New     PT LONG TERM GOAL #5   Title Pt will report improvement in overall functional mobility and balance, as evidenced by improved FOTO status score by at least 10%.   Time 8   Period Weeks   Status New               Plan - 04/06/17 1249    Clinical Impression Statement Skilled PT session today to initiated balance and walking HEP for home.  Also addressed lower extremity strengthening.  Pt requires cues throughout session for posture and slowed pace of exercises.  Pt asks several times during therapy about finishing session, wants to know how to measure progress of therapy.  He does not report increase in pain during session.  Pt would benefit from further skilled PT to address balance, strength and gait.   Rehab Potential Good   PT Frequency 2x / week   PT Duration 8 weeks  plus eval   PT Treatment/Interventions ADLs/Self Care Home Management;Patient/family education;Functional mobility training;Gait training;Neuromuscular re-education;Balance training;Therapeutic exercise;Therapeutic activities   PT Next Visit Plan Review balance HEP and walking program; progress lower extremity strengthening.  (Due to Trendelenburg pattern:  check  MMT of hip abductors vs. leg length?)   Consulted and Agree with Plan of Care Patient      Patient will benefit from skilled therapeutic intervention in order to improve the following deficits and impairments:  Abnormal gait, Decreased balance, Decreased mobility, Decreased strength, Difficulty walking, Postural dysfunction  Visit Diagnosis: Unsteadiness on feet  Other abnormalities of gait and mobility  Muscle weakness (generalized)     Problem List Patient Active Problem List   Diagnosis Date Noted  . Type 2 diabetes mellitus with vascular disease ([place]) 11/04/2015  . Left carotid bruit 11/04/2015  . PVD with claudication - s/p PV surgery May 2016 03/23/2015  . Intractable hiccups 02/16/2015  . Dehydration 02/16/2015  . Hyponatremia 02/16/2015  . Perineal abscess 06/20/2013  . Hypertension   . Hx of CABG 2009   . Hyperlipidemia   . Chronic constipation 06/27/2012    Charolett Yarrow W. 04/06/2017, 12:53 PM  Frazier Butt., PT  [facility] [address] [place], [place], [address] Phone: [phone]   Fax:  [phone]  Name: MURRIEL EIDEM MRN: [id number] Date of Birth: 08-Nov-1934

## 2017-04-13 ENCOUNTER — Ambulatory Visit: Payer: Medicare Other | Admitting: Physical Therapy

## 2017-04-13 ENCOUNTER — Encounter: Payer: Self-pay | Admitting: Physical Therapy

## 2017-04-13 DIAGNOSIS — R2689 Other abnormalities of gait and mobility: Secondary | ICD-10-CM | POA: Diagnosis not present

## 2017-04-13 DIAGNOSIS — M6281 Muscle weakness (generalized): Secondary | ICD-10-CM | POA: Diagnosis not present

## 2017-04-13 DIAGNOSIS — R2681 Unsteadiness on feet: Secondary | ICD-10-CM | POA: Diagnosis not present

## 2017-04-13 NOTE — Therapy (Signed)
[facility] [address] [place], [place], [address] Phone: [phone]   Fax:  [phone]  Physical Therapy Treatment  Patient Details  Name: [name] MRN: [id number] Date of Birth: Apr 04, 1935 Referring Provider: [name], MD  Encounter Date: 04/13/2017      PT End of Session - 04/13/17 1630    Visit Number 3   Number of Visits 17   Date for PT Re-Evaluation 05/23/17   Authorization Type Medicare Primary, AARP 2nd-GCODE every 10th visit   PT Start Time 1530   PT Stop Time 1615   PT Time Calculation (min) 45 min   Activity Tolerance Patient tolerated treatment well   Behavior During Therapy Flat affect      Past Medical History:  Diagnosis Date  . Arthritis   . CAD (coronary artery disease)   . Cataract   . Diabetes mellitus   . GERD (gastroesophageal reflux disease)   . Helicobacter pylori gastritis 01/2006, 03/2011   EGD - Pylera Tx 2010  . Hiccups   . History of kidney stones   . Hyperlipidemia   . Hypertension   . PAD (peripheral artery disease) ([place])     Past Surgical History:  Procedure Laterality Date  . CATARACT EXTRACTION     right  . COLONOSCOPY  2002, 2007, 06/09/2011   Dr. Evelina Bucy - [name], [place]: for FHx colon cancer, no adenomas; 2012: [facility] - normal  . CORONARY ARTERY BYPASS GRAFT  05-07-2008  . ENDARTERECTOMY FEMORAL Bilateral 03/23/2015   Procedure: ENDARTERECTOMY OF BILATERAL COMMON FEMORAL AND PROFUNDA ARTERIES;  Surgeon: Rosetta Posner, MD;  Location: [facility];  Service: Vascular;  Laterality: Bilateral;  . ESOPHAGOGASTRODUODENOSCOPY  02/20/06   Dr. Evelina Bucy, [name], [place]. pylori gastritis and GERD changes (pathology)  . EYE SURGERY Left    cataract  . HERNIA REPAIR  12/24/2010  . INSERTION OF ILIAC STENT Bilateral 03/23/2015   Procedure: LEFT EXTERNAL ILIAC AND BILATERAL COMMON ILIAC STENT;  Surgeon: Rosetta Posner, MD;  Location: [facility];  Service: Vascular;  Laterality:  Bilateral;  . JOINT REPLACEMENT     right knee replacement  . PATCH ANGIOPLASTY Bilateral 03/23/2015   Procedure: PATCH ANGIOPLASTY OF BILATERAL COMMON FEMORAL ARTERIES;  Surgeon: Rosetta Posner, MD;  Location: [facility];  Service: Vascular;  Laterality: Bilateral;  . PERIPHERAL VASCULAR CATHETERIZATION N/A 03/11/2015   Procedure: Abdominal Aortogram w/Lower Extremity;  Surgeon: Rosetta Posner, MD;  Location: [facility];  Service: Cardiovascular;  Laterality: N/A;  . TOTAL KNEE ARTHROPLASTY  07-13-2012   right  . UPPER GASTROINTESTINAL ENDOSCOPY  03/09/2009   Dr. Carlean Purl: H. pylori gastritis (Pylera Tx) and 2 cm hiatal hernia    There were no vitals filed for this visit.      Subjective Assessment - 04/13/17 1531    Subjective Pain in feet not too bad today. Doing fair with walking program. Maybe up to 5 minutes. Later in session, "do you think you've ever helped anyone with this?" States he doesn't know why he's here.   Patient is accompained by: Family member  wife   Pertinent History CAD, CABG, endarterectomy, GERD, DM, HTN   Patient Stated Goals unable to state   Currently in Pain? Yes   Pain Score 4    Pain Location Foot   Pain Orientation Right;Left   Pain Type Chronic pain   Pain Onset More than a month ago  [facility] Adult PT Treatment/Exercise - 04/13/17 1620      Ambulation/Gait   Ambulation/Gait Assistance 5: Supervision   Ambulation Distance (Feet) 120 Feet  50, 120, 50, 50   Assistive device None   Gait Pattern Step-through pattern;Trendelenburg;Wide base of support   Ambulation Surface Level;Indoor     Posture/Postural Control   Posture/Postural Control Postural limitations   Postural Limitations Rounded Shoulders;Forward head   Posture Comments nearly constant cues to look forward; when asked agrees he looks at his feet because he can't feel them     Knee/Hip Exercises: Standing   Hip Flexion Stengthening;Both;1 set;10  reps;Knee bent   Hip Abduction Stengthening;Both;1 set;10 reps;Knee straight   Abduction Limitations vc and tactile for upright torso   Hip Extension Stengthening;Both;1 set;10 reps;Knee bent   Extension Limitations vc for knee straight, however pt continued to bend knee             Balance Exercises - 04/13/17 1625      Balance Exercises: Standing   Standing Eyes Opened Wide (BOA);Narrow base of support (BOS);Foam/compliant surface;Head turns   Standing Eyes Closed Wide (BOA);Foam/compliant surface   Tandem Stance Eyes open;Upper extremity support 1;2 reps;10 secs   SLS Eyes open;Upper extremity support 1;Solid surface;3 reps;10 secs   Rockerboard Anterior/posterior;Lateral;Head turns;EO;Intermittent UE support   Balance Beam red/grey, blue; horizontal with eyes open, head turns, step off/on ant/post   Retro Gait Upper extremity support;3 reps  length of counter   Sidestepping Upper extremity support;4 reps  length of counter; also with bent knees/squat position   Marching Limitations Marching in place x 10 reps           PT Education - 04/13/17 1628    Education provided Yes   Education Details HEP from prior session-pt needed max vc for instruction and technique (no apparent carryover from prior session)   Person(s) Educated Patient   Methods Explanation;Demonstration;Tactile cues;Verbal cues;Handout   Comprehension Returned demonstration;Verbal cues required;Tactile cues required;Need further instruction          PT Short Term Goals - 03/25/17 1141      PT SHORT TERM GOAL #1   Title Pt will perform HEP with wife's supervision, for improved balance and gait.  TARGET 04/23/17   Time 4   Period Weeks   Status New     PT SHORT TERM GOAL #2   Title Pt will improve TUG score to less than or equal to 15 seconds for decreased fall risk.   Time 4   Period Weeks   Status New     PT SHORT TERM GOAL #3   Title Pt will improve Berg Balance test to at least 42/56 for  decreased fall risk.   Time 4   Period Weeks   Status New     PT SHORT TERM GOAL #4   Title Pt will ambulate at least 15 minutes, indoor and outdoor surfaces (with appropriate device), for improved gait in community.   Time 4   Period Weeks   Status New           PT Long Term Goals - 03/25/17 1143      PT LONG TERM GOAL #1   Title Pt will verbalize understanding of fall prevention in home environment.  TARGET 05/23/17   Time 8   Period Weeks   Status New     PT LONG TERM GOAL #2   Title Pt will improve TUG score to less than or equal to 13.5 seconds for  decreased fall risk.   Time 8   Period Weeks   Status New     PT LONG TERM GOAL #3   Title Pt will imrpove DGI score to at least 19/24 for decreased fall risk.   Time 8   Period Weeks   Status New     PT LONG TERM GOAL #4   Title Pt will improve Berg Balance score to at least 47/56 for decreased fall risk.   Time 8   Period Weeks   Status New     PT LONG TERM GOAL #5   Title Pt will report improvement in overall functional mobility and balance, as evidenced by improved FOTO status score by at least 10%.   Time 8   Period Weeks   Status New               Plan - 04/13/17 1631    Clinical Impression Statement Skilled session focused on correct technique and understanding of HEP (pt with very poor recall of technique), balance and gait training. Discussed with patient the importance of HEP in helping him to improve his balance and reduce his risk of falling (that he will not get full benefit of PT if he does not practice at home). Patient with very flat affect and clearly poor "buy-in" to benefits of PT and exercises. Will continue to attempt to progress patient as he is at high fall risk with h/o 2 recent falls.    Rehab Potential Fair   Clinical Impairments Affecting Rehab Potential ?cognition and ability to carryover instructions/HEP   PT Frequency 2x / week   PT Duration 8 weeks  plus eval   PT  Treatment/Interventions ADLs/Self Care Home Management;Patient/family education;Functional mobility training;Gait training;Neuromuscular re-education;Balance training;Therapeutic exercise;Therapeutic activities   PT Next Visit Plan Review balance HEP and walking program (required max vc last visit); progress lower extremity strengthening.  (Due to Trendelenburg pattern:  check MMT of hip abductors vs. leg length?) 6/14 pt denied leg length discrepancy but ran out of time to assess   Consulted and Agree with Plan of Care Patient      Patient will benefit from skilled therapeutic intervention in order to improve the following deficits and impairments:  Abnormal gait, Decreased balance, Decreased mobility, Decreased strength, Difficulty walking, Postural dysfunction  Visit Diagnosis: Unsteadiness on feet  Other abnormalities of gait and mobility  Muscle weakness (generalized)     Problem List Patient Active Problem List   Diagnosis Date Noted  . Type 2 diabetes mellitus with vascular disease ([place]) 11/04/2015  . Left carotid bruit 11/04/2015  . PVD with claudication - s/p PV surgery May 2016 03/23/2015  . Intractable hiccups 02/16/2015  . Dehydration 02/16/2015  . Hyponatremia 02/16/2015  . Perineal abscess 06/20/2013  . Hypertension   . Hx of CABG 2009   . Hyperlipidemia   . Chronic constipation 06/27/2012    Rexanne Mano, PT 04/13/2017, 4:37 PM  [facility] [address] [place], [place], [address] Phone: [phone]   Fax:  [phone]  Name: MONTRAY KLIEBERT MRN: [id number] Date of Birth: May 16, 1935

## 2017-04-19 ENCOUNTER — Ambulatory Visit: Payer: Medicare Other | Admitting: Physical Therapy

## 2017-04-19 DIAGNOSIS — R2681 Unsteadiness on feet: Secondary | ICD-10-CM | POA: Diagnosis not present

## 2017-04-19 DIAGNOSIS — M6281 Muscle weakness (generalized): Secondary | ICD-10-CM | POA: Diagnosis not present

## 2017-04-19 DIAGNOSIS — R2689 Other abnormalities of gait and mobility: Secondary | ICD-10-CM | POA: Diagnosis not present

## 2017-04-19 NOTE — Patient Instructions (Addendum)
Bridging    Slowly raise buttocks from floor, keeping stomach tight. Hold for 3 seconds.  Repeat _10__ times per set. Do __2__ sets per session. Do __1-2__ sessions per day.  http://orth.exer.us/1096   Copyright  VHI. All rights reserved.  Strengthening: Hip Abductor - Resisted    Lie on your back with knees bent.  With band looped around both legs above knees, push right thigh out to the side, keeping the left hip steady.  Hold for 3 seconds then relax bact to middle.  Repeat _10___ times per set.  Then repeat with left leg pulling out to the side.  Do __1-2__ sets per session.  http://orth.exer.us/688   Copyright  VHI. All rights reserved.

## 2017-04-20 NOTE — Therapy (Signed)
[name] [address] [place], [place], [address] Phone: [phone]   Fax:  [phone]  Physical Therapy Treatment  Patient Details  Name: [name] MRN: [id number] Date of Birth: 1935-04-20 Referring Provider: [name], MD  Encounter Date: 04/19/2017      PT End of Session - 04/20/17 1651    Visit Number 4   Number of Visits 17   Date for PT Re-Evaluation 05/23/17   Authorization Type Medicare Primary, AARP 2nd-GCODE every 10th visit   PT Start Time 1105   PT Stop Time 1145   PT Time Calculation (min) 40 min   Activity Tolerance Patient tolerated treatment well   Behavior During Therapy Flat affect      Past Medical History:  Diagnosis Date  . Arthritis   . CAD (coronary artery disease)   . Cataract   . Diabetes mellitus   . GERD (gastroesophageal reflux disease)   . Helicobacter pylori gastritis 01/2006, 03/2011   EGD - Pylera Tx 2010  . Hiccups   . History of kidney stones   . Hyperlipidemia   . Hypertension   . PAD (peripheral artery disease) ([place])     Past Surgical History:  Procedure Laterality Date  . CATARACT EXTRACTION     right  . COLONOSCOPY  2002, 2007, 06/09/2011   Dr. Evelina Bucy - [name], [place]: for FHx colon cancer, no adenomas; 2012: [facility] - normal  . CORONARY ARTERY BYPASS GRAFT  05-07-2008  . ENDARTERECTOMY FEMORAL Bilateral 03/23/2015   Procedure: ENDARTERECTOMY OF BILATERAL COMMON FEMORAL AND PROFUNDA ARTERIES;  Surgeon: Rosetta Posner, MD;  Location: [facility];  Service: Vascular;  Laterality: Bilateral;  . ESOPHAGOGASTRODUODENOSCOPY  02/20/06   Dr. Evelina Bucy, [name], [place]. pylori gastritis and GERD changes (pathology)  . EYE SURGERY Left    cataract  . HERNIA REPAIR  12/24/2010  . INSERTION OF ILIAC STENT Bilateral 03/23/2015   Procedure: LEFT EXTERNAL ILIAC AND BILATERAL COMMON ILIAC STENT;  Surgeon: Rosetta Posner, MD;  Location: [facility];  Service: Vascular;  Laterality:  Bilateral;  . JOINT REPLACEMENT     right knee replacement  . PATCH ANGIOPLASTY Bilateral 03/23/2015   Procedure: PATCH ANGIOPLASTY OF BILATERAL COMMON FEMORAL ARTERIES;  Surgeon: Rosetta Posner, MD;  Location: [facility];  Service: Vascular;  Laterality: Bilateral;  . PERIPHERAL VASCULAR CATHETERIZATION N/A 03/11/2015   Procedure: Abdominal Aortogram w/Lower Extremity;  Surgeon: Rosetta Posner, MD;  Location: [facility];  Service: Cardiovascular;  Laterality: N/A;  . TOTAL KNEE ARTHROPLASTY  07-13-2012   right  . UPPER GASTROINTESTINAL ENDOSCOPY  03/09/2009   Dr. Carlean Purl: H. pylori gastritis (Pylera Tx) and 2 cm hiatal hernia    There were no vitals filed for this visit.      Subjective Assessment - 04/19/17 1106    Subjective Sore from walking more.  About 5 minutes at a time, 3 times per day. "Oh, I have a cane, but I don't want to use it."   Patient is accompained by: Family member  wife   Pertinent History CAD, CABG, endarterectomy, GERD, DM, HTN   Patient Stated Goals unable to state   Currently in Pain? Yes   Pain Score 5    Pain Location Leg   Pain Orientation Right;Left   Pain Descriptors / Indicators Aching;Sore   Pain Onset More than a month ago   Pain Frequency Constant   Aggravating Factors  walking too long   Pain Relieving Factors sitting, resting  alleviates pain            [facility] PT Assessment - 04/20/17 0001      Observation/Other Assessments   Observations Assessed leg length due to pt's Trend[place]burg type gait pattern:  Measured from greater trochanter to medial malleolus:  RLE 86 cm.  LLE 85 cm.  Upon initial measurement, PT asks pt if he has ever had insert for LLD and he replies no.  Later on in session, when PT asks to try insert or heel wedge for LLE to address leg length discrepancy, pt replies he already has inserts from podiatrist.  Unable to determine from pt's report if the L insert addresses leg length.     Strength   Overall Strength Deficits   Assessed MMT of hip abduction due [place]rend[place]burg gait   Right Hip ABduction 3+/5   Left Hip ABduction 3-/5  brings leg into hip flexion with MMT position                     [facility] Adult PT Treatment/Exercise - 04/20/17 1404      Self-Care   Self-Care Other Self-Care Comments   Other Self-Care Comments  Discussed results of leg length measure, with LLE 1 cm shorter than RLE.  Discussed option for trying insert, but pt declines.  Discussed hip abductor weakness and its contribution to hip instability with gait.  (Began to address with addition to HEP).  Discussed pt's multi-factorial issues with gait and balance and PT recommends gait training with cane to improve safety with gait.  Pt declines gait training with either cane or rolling walker, because he states he will not use.     Exercises   Exercises Knee/Hip;Ankle     Knee/Hip Exercises: Standing   Hip Flexion Stengthening;Both;1 set;10 reps;Knee bent   Hip Abduction Stengthening;Both;1 set;10 reps;Knee straight   Abduction Limitations verbal cues for upright torso; pt keeps trunk flexed   Hip Extension Stengthening;Both;1 set;10 reps;Knee bent   Extension Limitations vc for knee straight, however pt continued to bend knee   Other Standing Knee Exercises Heel/toe raises x 10 reps     Knee/Hip Exercises: Supine   Bridges Limitations Bridge x 10 reps 3 second hold to side, with other leg stabilizing at midline   Other Supine Knee/Hip Exercises Hooklying hip abduction with green theraband x 10 reps     Knee/Hip Exercises: Sidelying   Hip ABduction AROM;Left;5 reps   Hip ABduction Limitations Attempted, but pt unable to perform correctly beyond 5 attempts, despite cues             Balance Exercises - 04/20/17 1408      Balance Exercises: Standing   Standing Eyes Opened Wide ([place]);Foam/compliant surface;Head turns;Narrow base of support (BOS)  Head nods x 5 reps UE support   Sidestepping Upper extremity support;4  reps  Cues for upright posture   Other Standing Exercises Standing on foam-lateral weightshifting>marching in place x 10 reps each with UE support and cues for upright posture (pt continues to hold posture forward flexed despite cues)           PT Education - 04/20/17 1650    Education provided Yes   Education Details HEP additions, discussion regarding hip weakness, leg length difference and recommendations for cane use    Person(s) Educated Patient   Methods Explanation;Handout   Comprehension Verbalized understanding  Pt reports he will not use cane or walker, he declines trial of shoe insert.  PT Short Term Goals - 03/25/17 1141      PT SHORT TERM GOAL #1   Title Pt will perform HEP with wife's supervision, for improved balance and gait.  TARGET 04/23/17   Time 4   Period Weeks   Status New     PT SHORT TERM GOAL #2   Title Pt will improve TUG score to less than or equal to 15 seconds for decreased fall risk.   Time 4   Period Weeks   Status New     PT SHORT TERM GOAL #3   Title Pt will improve Berg Balance test to at least 42/56 for decreased fall risk.   Time 4   Period Weeks   Status New     PT SHORT TERM GOAL #4   Title Pt will ambulate at least 15 minutes, indoor and outdoor surfaces (with appropriate device), for improved gait in community.   Time 4   Period Weeks   Status New           PT Long Term Goals - 03/25/17 1143      PT LONG TERM GOAL #1   Title Pt will verbalize understanding of fall prevention in home environment.  TARGET 05/23/17   Time 8   Period Weeks   Status New     PT LONG TERM GOAL #2   Title Pt will improve TUG score to less than or equal to 13.5 seconds for decreased fall risk.   Time 8   Period Weeks   Status New     PT LONG TERM GOAL #3   Title Pt will imrpove DGI score to at least 19/24 for decreased fall risk.   Time 8   Period Weeks   Status New     PT LONG TERM GOAL #4   Title Pt will improve Berg  Balance score to at least 47/56 for decreased fall risk.   Time 8   Period Weeks   Status New     PT LONG TERM GOAL #5   Title Pt will report improvement in overall functional mobility and balance, as evidenced by improved FOTO status score by at least 10%.   Time 8   Period Weeks   Status New               Plan - 04/20/17 1652    Clinical Impression Statement Skilled physical therapy session focused on assessing leg length difference and hip strength and its contribution to pt's gait pattern/decreased balance; also worked on balance  and strengthening exercises.  Pt's leg length difference (LLE shorter by 1 cm than RLE) as well as decreased L hip strength are likely contributors to pt's decreased balance and gait intability.  Pt does not appear interested in use of assistive device to address safety with gait, and unsure of pt's carryover and practice with exercises at home.  Will continue to attempt to address pt's balance, strength, and gait.   Rehab Potential Fair   Clinical Impairments Affecting Rehab Potential ?cognition and ability to carryover instructions/HEP   PT Frequency 2x / week   PT Duration 8 weeks  plus eval   PT Treatment/Interventions ADLs/Self Care Home Management;Patient/family education;Functional mobility training;Gait training;Neuromuscular re-education;Balance training;Therapeutic exercise;Therapeutic activities   PT Next Visit Plan Check HEP next visit to assess compliance with HEP; consider OTAGO to address strength and balance to prep for d/c in next several visits (based on pt's compliance)   Consulted and Agree with Plan of Care  Patient      Patient will benefit from skilled therapeutic intervention in order to improve the following deficits and impairments:  Abnormal gait, Decreased balance, Decreased mobility, Decreased strength, Difficulty walking, Postural dysfunction  Visit Diagnosis: Muscle weakness (generalized)  Unsteadiness on  feet     Problem List Patient Active Problem List   Diagnosis Date Noted  . Type 2 diabetes mellitus with vascular disease ([place]) 11/04/2015  . Left carotid bruit 11/04/2015  . PVD with claudication - s/p PV surgery May 2016 03/23/2015  . Intractable hiccups 02/16/2015  . Dehydration 02/16/2015  . Hyponatremia 02/16/2015  . Perineal abscess 06/20/2013  . Hypertension   . Hx of CABG 2009   . Hyperlipidemia   . Chronic constipation 06/27/2012    Bradden Tadros W. 04/20/2017, 4:59 PM Frazier Butt., PT   [facility] [address] [place], [place], [address] Phone: [phone]   Fax:  [phone]  Name: [name] MRN: [id number] Date of Birth: 1935-03-09

## 2017-04-21 ENCOUNTER — Ambulatory Visit: Payer: Medicare Other | Admitting: Physical Therapy

## 2017-04-21 ENCOUNTER — Encounter: Payer: Self-pay | Admitting: Physical Therapy

## 2017-04-21 DIAGNOSIS — R2689 Other abnormalities of gait and mobility: Secondary | ICD-10-CM

## 2017-04-21 DIAGNOSIS — M6281 Muscle weakness (generalized): Secondary | ICD-10-CM | POA: Diagnosis not present

## 2017-04-21 DIAGNOSIS — R2681 Unsteadiness on feet: Secondary | ICD-10-CM | POA: Diagnosis not present

## 2017-04-21 NOTE — Therapy (Signed)
[facility] [address] [place], [place], [address] Phone: [phone]   Fax:  [phone]  Physical Therapy Treatment and Discharge  Patient Details  Name: [name] MRN: [id number] Date of Birth: May 30, 1935 Referring Provider: [name], MD  Encounter Date: 04/21/2017      PT End of Session - 04/21/17 1219    Visit Number 5   Number of Visits 17   Date for PT Re-Evaluation 05/23/17   Authorization Type Medicare Primary, AARP 2nd-GCODE every 10th visit   PT Start Time 1102   PT Stop Time 1135  time subtracted for printer issues & couldn't print his HEP   PT Time Calculation (min) 33 min   Activity Tolerance Patient tolerated treatment well   Behavior During Therapy Flat affect      Past Medical History:  Diagnosis Date  . Arthritis   . CAD (coronary artery disease)   . Cataract   . Diabetes mellitus   . GERD (gastroesophageal reflux disease)   . Helicobacter pylori gastritis 01/2006, 03/2011   EGD - Pylera Tx 2010  . Hiccups   . History of kidney stones   . Hyperlipidemia   . Hypertension   . PAD (peripheral artery disease) ([place])     Past Surgical History:  Procedure Laterality Date  . CATARACT EXTRACTION     right  . COLONOSCOPY  2002, 2007, 06/09/2011   Dr. Evelina Bucy - [name], [place]: for FHx colon cancer, no adenomas; 2012: [facility] - normal  . CORONARY ARTERY BYPASS GRAFT  05-07-2008  . ENDARTERECTOMY FEMORAL Bilateral 03/23/2015   Procedure: ENDARTERECTOMY OF BILATERAL COMMON FEMORAL AND PROFUNDA ARTERIES;  Surgeon: Rosetta Posner, MD;  Location: [facility];  Service: Vascular;  Laterality: Bilateral;  . ESOPHAGOGASTRODUODENOSCOPY  02/20/06   Dr. Evelina Bucy, [name], [place]. pylori gastritis and GERD changes (pathology)  . EYE SURGERY Left    cataract  . HERNIA REPAIR  12/24/2010  . INSERTION OF ILIAC STENT Bilateral 03/23/2015   Procedure: LEFT EXTERNAL ILIAC AND BILATERAL COMMON ILIAC STENT;  Surgeon:  Rosetta Posner, MD;  Location: [facility];  Service: Vascular;  Laterality: Bilateral;  . JOINT REPLACEMENT     right knee replacement  . PATCH ANGIOPLASTY Bilateral 03/23/2015   Procedure: PATCH ANGIOPLASTY OF BILATERAL COMMON FEMORAL ARTERIES;  Surgeon: Rosetta Posner, MD;  Location: [facility];  Service: Vascular;  Laterality: Bilateral;  . PERIPHERAL VASCULAR CATHETERIZATION N/A 03/11/2015   Procedure: Abdominal Aortogram w/Lower Extremity;  Surgeon: Rosetta Posner, MD;  Location: [facility];  Service: Cardiovascular;  Laterality: N/A;  . TOTAL KNEE ARTHROPLASTY  07-13-2012   right  . UPPER GASTROINTESTINAL ENDOSCOPY  03/09/2009   Dr. Carlean Purl: H. pylori gastritis (Pylera Tx) and 2 cm hiatal hernia    There were no vitals filed for this visit.      Subjective Assessment - 04/21/17 1100    Subjective Denies any recent falls. States he wants to cancel the appointments for the next two weeks as he will be on vacation. He does not want to add any appointments beyond the ones he already has to make up the appts. (And by the end of session was in agreement to cancel all his remaining appointments)   Patient is accompained by: Family member  wife   Pertinent History CAD, CABG, endarterectomy, GERD, DM, HTN   Patient Stated Goals unable to state   Currently in Pain? No/denies   Pain Onset --  [facility] Adult PT Treatment/Exercise - 04/21/17 1158      Bed Mobility   Bed Mobility Supine to Sit;Sit to Supine   Supine to Sit 6: Modified independent (Device/Increase time)   Sit to Supine 6: Modified independent (Device/Increase time)     Transfers   Sit to Stand 6: Modified independent (Device/Increase time)   Stand to Sit 5: Supervision   Stand to Sit Details when tired, does not control descent     Ambulation/Gait   Ambulation/Gait Assistance 6: Modified independent (Device/Increase time);3: Mod assist   Ambulation/Gait Assistance Details pt using cane  modified independent majority of session; began walking without cane (including outside) and pt with unsteadiness noted in grass (wider BOS, arms out at his side). In clinic after 100 ft, he began staggering forward and reaching for furniture, started again and again with significant LOB that could have resulted in a fall if PT and wall had not supported him. Pt then reported he was tired and wanted to sit down. Made it to chair ~8 ft away. He denied any other symptoms. Denied need for water. After resting ~4 minutes was able to walk with SPC and supervision x 80 ft.    Ambulation Distance (Feet) 500 Feet  100,80   Assistive device Straight cane;None   Gait Pattern Step-through pattern;Trendelenburg;Wide base of support   Ambulation Surface Level;Unlevel;Indoor;Outdoor;Gravel;Grass     Knee/Hip Exercises: Standing   Hip Flexion Stengthening;5 reps;Knee bent   Hip Flexion Limitations vc to turn sideways to counter (pt trying to face counter and hold with bil UEs; once sideways had to cue to "march" alternating legs not unilateral hip flexion x 10 and then switch)   Hip Abduction Stengthening;Both;5 reps   Abduction Limitations assessing technique for pt's HEP   Hip Extension Stengthening;Both;1 set;10 reps   Extension Limitations vc for knee extension, stand upright (not lean forward on his hands at counter), pt would not even attempt to change his technique     Knee/Hip Exercises: Supine   Bridges Limitations bridge x 5; pt working from his handout for HEP and began to do exercise with legs fully extended on bed. Cued pt to look at the picture again and he insisted he was doing it like the picture. VC to bend knees   Other Supine Knee/Hip Exercises Hooklying hip abduction with green theraband x 10 reps             Balance Exercises - 04/21/17 1213      Balance Exercises: Standing   Standing Eyes Opened Wide (BOA);Head turns;Foam/compliant surface  intermittent UE support in //bars    SLS Eyes open;Upper extremity support 2;1 rep;10 secs   Stepping Strategy Anterior;Foam/compliant surface  red mat; attempted x 2 with min-mod assist for balance & sto   Step Over Hurdles / Cones // bars, 4"x2" black foam attempted forwrard x6 (only cleared his second foot x 1 despite cues; backwards x 2 bil UE support   Other Standing Exercises on blue airex, step forward to solid floor and back up onto foam. pt could not coordinate alternating steps and did 10 on Rt and 10 on Lt           PT Education - 04/21/17 1217    Education provided Yes   Education Details role of PT, importance of patient "buy-in" and participation (including HEP),    Person(s) Educated Patient   Comprehension Verbalized understanding          PT Short Term Goals - 04/21/17  Rose Emanuele #1   Title Pt will perform HEP with wife's supervision, for improved balance and gait.  TARGET 04/23/17   Time 4   Period Weeks   Status Unable to assess  pt discharged from PT prior to assessment     PT SHORT TERM GOAL #2   Title Pt will improve TUG score to less than or equal to 15 seconds for decreased fall risk.   Time 4   Period Weeks   Status Unable to assess  pt discharged from PT prior to assessment     PT SHORT TERM GOAL #3   Title Pt will improve Berg Balance test to at least 42/56 for decreased fall risk.   Time 4   Period Weeks   Status Unable to assess  pt discharged from PT prior to assessment     PT SHORT TERM GOAL #4   Title Pt will ambulate at least 15 minutes, indoor and outdoor surfaces (with appropriate device), for improved gait in community.   Time 4   Period Weeks   Status Unable to assess  pt discharged from PT prior to assessment           PT Long Term Goals - 04/21/17 1228      PT LONG TERM GOAL #1   Title Pt will verbalize understanding of fall prevention in home environment.  TARGET 05/23/17   Time 8   Period Weeks   Status Unable to assess  pt discharged  from PT prior to assessment     PT LONG TERM GOAL #2   Title Pt will improve TUG score to less than or equal to 13.5 seconds for decreased fall risk.   Time 8   Period Weeks   Status Unable to assess  pt discharged from PT prior to assessment     PT LONG TERM GOAL #3   Title Pt will imrpove DGI score to at least 19/24 for decreased fall risk.   Time 8   Period Weeks   Status Unable to assess  pt discharged from PT prior to assessment     PT LONG TERM GOAL #4   Title Pt will improve Berg Balance score to at least 47/56 for decreased fall risk.   Time 8   Period Weeks   Status Unable to assess  pt discharged from PT prior to assessment     PT LONG TERM GOAL #5   Title Pt will report improvement in overall functional mobility and balance, as evidenced by improved FOTO status score by at least 10%.   Time 8   Period Weeks   Status Unable to assess  pt discharged from PT prior to assessment               Plan - 04/21/17 1223    Clinical Impression Statement Session focused on reviewing HEP (with pt using his handout) to assess if pt doing exercises correctly at home. He required multiple cues (see note), yet continued to state he was doing exercises at home. Then stated he maybe only does them twice per week. Patient again made comments during session that he "doesn't believe in all this." Sat down and discussed his goals and how to proceed with PT. He was planning to take the next 2 weeks off for vacation and agreed he did not want to return to clinic for his final 2 appointments. Patient is being discharged from PT.  Rehab Potential Fair   Clinical Impairments Affecting Rehab Potential ?cognition and ability to carryover instructions/HEP   PT Duration --  plus eval   Consulted and Agree with Plan of Care Patient      Patient will benefit from skilled therapeutic intervention in order to improve the following deficits and impairments:     Visit Diagnosis: Muscle  weakness (generalized)  Unsteadiness on feet  Other abnormalities of gait and mobility       G-Codes - May 16, 2017 1231    Functional Assessment Tool Used (Outpatient Only) TUG 19.91 sec, DGI 14/24; Neuro QOL 31.3, FOT intake 36; 37/56 Berg; 2 falls in past 6 months   Functional Limitation Mobility: Walking and moving around   Mobility: Walking and Moving Around Goal Status [phone]) At least 20 percent but less than 40 percent impaired, limited or restricted   Mobility: Walking and Moving Around Discharge Status [phone]) At least 40 percent but less than 60 percent impaired, limited or restricted      Problem List Patient Active Problem List   Diagnosis Date Noted  . Type 2 diabetes mellitus with vascular disease ([place]) 11/04/2015  . Left carotid bruit 11/04/2015  . PVD with claudication - s/p PV surgery May 2016 03/23/2015  . Intractable hiccups 02/16/2015  . Dehydration 02/16/2015  . Hyponatremia 02/16/2015  . Perineal abscess 06/20/2013  . Hypertension   . Hx of CABG 2009   . Hyperlipidemia   . Chronic constipation 06/27/2012   PHYSICAL THERAPY DISCHARGE SUMMARY  Visits from Start of Care: 5  Current functional level related to goals / functional outcomes: Unable to assess as pt decided to not return    Remaining deficits: Imbalance, unsteady gait, weakness   Education / Equipment: HEP, theraband  Plan: Patient agrees to discharge.  Patient goals were not met. Patient is being discharged due to the patient's request.  ?????       Rexanne Mano, PT 2017-05-16, 12:38 PM  [facility] [address] [place], [place], [address] Phone: [phone]   Fax:  [phone]  Name: DEAVEN URWIN MRN: [id number] Date of Birth: 1935/06/28

## 2017-04-25 ENCOUNTER — Ambulatory Visit: Payer: Medicare Other | Admitting: Physical Therapy

## 2017-04-27 ENCOUNTER — Ambulatory Visit: Payer: Medicare Other | Admitting: Physical Therapy

## 2017-05-04 ENCOUNTER — Ambulatory Visit: Payer: Medicare Other | Admitting: Physical Therapy

## 2017-05-05 ENCOUNTER — Ambulatory Visit: Payer: Medicare Other | Admitting: Physical Therapy

## 2017-05-09 ENCOUNTER — Ambulatory Visit: Payer: Medicare Other | Admitting: Physical Therapy

## 2017-05-11 ENCOUNTER — Ambulatory Visit: Payer: Medicare Other | Admitting: Physical Therapy

## 2017-07-04 ENCOUNTER — Encounter (HOSPITAL_COMMUNITY): Payer: Medicare Other

## 2017-07-04 ENCOUNTER — Ambulatory Visit: Payer: Medicare Other | Admitting: Family

## 2017-07-06 DIAGNOSIS — I739 Peripheral vascular disease, unspecified: Secondary | ICD-10-CM | POA: Diagnosis not present

## 2017-07-06 DIAGNOSIS — I2510 Atherosclerotic heart disease of native coronary artery without angina pectoris: Secondary | ICD-10-CM | POA: Diagnosis not present

## 2017-07-06 DIAGNOSIS — Z6825 Body mass index (BMI) 25.0-25.9, adult: Secondary | ICD-10-CM | POA: Diagnosis not present

## 2017-07-06 DIAGNOSIS — G8310 Monoplegia of lower limb affecting unspecified side: Secondary | ICD-10-CM | POA: Diagnosis not present

## 2017-07-06 DIAGNOSIS — E1151 Type 2 diabetes mellitus with diabetic peripheral angiopathy without gangrene: Secondary | ICD-10-CM | POA: Diagnosis not present

## 2017-07-06 DIAGNOSIS — M6281 Muscle weakness (generalized): Secondary | ICD-10-CM | POA: Diagnosis not present

## 2017-07-06 DIAGNOSIS — I10 Essential (primary) hypertension: Secondary | ICD-10-CM | POA: Diagnosis not present

## 2017-07-06 DIAGNOSIS — E1149 Type 2 diabetes mellitus with other diabetic neurological complication: Secondary | ICD-10-CM | POA: Diagnosis not present

## 2017-07-06 DIAGNOSIS — Z23 Encounter for immunization: Secondary | ICD-10-CM | POA: Diagnosis not present

## 2017-07-06 DIAGNOSIS — E784 Other hyperlipidemia: Secondary | ICD-10-CM | POA: Diagnosis not present

## 2017-07-07 ENCOUNTER — Encounter: Payer: Self-pay | Admitting: Neurology

## 2017-07-07 ENCOUNTER — Other Ambulatory Visit: Payer: Self-pay | Admitting: Internal Medicine

## 2017-07-07 DIAGNOSIS — R29898 Other symptoms and signs involving the musculoskeletal system: Secondary | ICD-10-CM

## 2017-07-11 ENCOUNTER — Ambulatory Visit
Admission: RE | Admit: 2017-07-11 | Discharge: 2017-07-11 | Disposition: A | Discharge status: Home / Self Care | Payer: Medicare Other | Source: Ambulatory Visit | Attending: Internal Medicine | Admitting: Internal Medicine

## 2017-07-11 DIAGNOSIS — M48061 Spinal stenosis, lumbar region without neurogenic claudication: Secondary | ICD-10-CM | POA: Diagnosis not present

## 2017-07-11 DIAGNOSIS — R29898 Other symptoms and signs involving the musculoskeletal system: Secondary | ICD-10-CM

## 2017-07-20 DIAGNOSIS — M5440 Lumbago with sciatica, unspecified side: Secondary | ICD-10-CM | POA: Diagnosis not present

## 2017-07-20 DIAGNOSIS — M5442 Lumbago with sciatica, left side: Secondary | ICD-10-CM | POA: Diagnosis not present

## 2017-08-10 NOTE — Progress Notes (Signed)
[name] was seen today in the movement disorders clinic for neurologic consultation at the request of [name], MD.  The consultation is for the evaluation of shuffling gait and L leg weakness and to r/o PD vs neuropathy.  He is accompanied by his wife and 2 sons, who supplement the history.  Son states that they are more concerned about cognitive decline  Specific Symptoms:  Tremor: Yes.  , either hand, at rest, but rare and intermittently Family hx of similar:  Yes.  , father, sister, children have tremor in hand  Voice: weaker Sleep: sleeps well per patient but family denies; some trouble getting to sleep per patient after pushed; a lot of napping during day  Vivid Dreams:  No.  Acting out dreams:  No. Wet Pillows: No. Postural symptoms:  Yes.    Falls?  Yes.   (multiple per records.  [place] after leaving rehab in 03/2017 and hit head on fireplace; several falls since then; fell 2 times in the last 2 weeks - most falls when turning per wife.  Felt that PT made worse) Bradykinesia symptoms: slow movements, slowed thought processes, difficulty getting out of a chair and difficulty regaining balance Loss of smell:  No. Loss of taste:  Yes.   Urinary Incontinence:  Yes.  , because walking is so slow that cannot make it to bathroom.  Wears undergarments x 6 months x 1 year Difficulty Swallowing:  Yes.   x 1 year Handwriting, micrographia: No. Trouble with ADL's:  No. , but he is slow  Trouble buttoning clothing: No. Depression:  No. per patient but wife states that he has "zero" zest for life which is different Memory changes:  Yes.  , x 2 years in September.  Wife thinks due to crestor initiation.  Son states that both short and long term memory are impaired and wife states that his memory is okay but he is "confused."  Wife does finances x 3 months due to confusion.  Wife does pills x years.  Patient drives.   Hallucinations:  No.  visual distortions: No. N/V:  No. Lightheaded:   No.  Syncope: No. Diplopia:  No. Dyskinesia:  No.   MRI brain in 10/2016 and I reviewed that and there was mod small vessel disease.      ALLERGIES:   Allergies  Allergen Reactions  . Sulfonamide Derivatives Hives    CURRENT MEDICATIONS:  Outpatient Encounter Prescriptions as of 08/11/2017  Medication Sig  . aspirin 81 MG tablet Take 81 mg by mouth daily.    . Coenzyme Q10 (CO Q-10 PO) Take 300 mg by mouth 2 (two) times a week. Take on Mon and Fri.  . metFORMIN (GLUCOPHAGE) 1000 MG tablet Take 1 tablet by mouth Daily.   . metoprolol succinate (TOPROL-XL) 50 MG 24 hr tablet Take 50 mg by mouth daily.  . ramipril (ALTACE) 5 MG capsule Take 1 tablet by mouth Daily.   . [DISCONTINUED] metoprolol succinate (TOPROL-XL) 25 MG 24 hr tablet Take 25 mg by mouth daily.   No facility-administered encounter medications on file as of 08/11/2017.     PAST MEDICAL HISTORY:   Past Medical History:  Diagnosis Date  . Arthritis   . CAD (coronary artery disease)   . Cataract   . Diabetes mellitus   . GERD (gastroesophageal reflux disease)   . Helicobacter pylori gastritis 01/2006, 03/2011   EGD - Pylera Tx 2010  . Hiccups   . History of kidney stones   .  Hyperlipidemia   . Hypertension   . PAD (peripheral artery disease) ([place])     PAST SURGICAL HISTORY:   Past Surgical History:  Procedure Laterality Date  . CATARACT EXTRACTION     right  . COLONOSCOPY  2002, 2007, 06/09/2011   Dr. Evelina Bucy - [name], [place]: for FHx colon cancer, no adenomas; 2012: [facility] - normal  . CORONARY ARTERY BYPASS GRAFT  05-07-2008  . ENDARTERECTOMY FEMORAL Bilateral 03/23/2015   Procedure: ENDARTERECTOMY OF BILATERAL COMMON FEMORAL AND PROFUNDA ARTERIES;  Surgeon: Rosetta Posner, MD;  Location: [facility];  Service: Vascular;  Laterality: Bilateral;  . ESOPHAGOGASTRODUODENOSCOPY  02/20/06   Dr. Evelina Bucy, [name], [place]. pylori gastritis and GERD changes (pathology)  . EYE SURGERY Left    cataract  . HERNIA REPAIR   12/24/2010  . INSERTION OF ILIAC STENT Bilateral 03/23/2015   Procedure: LEFT EXTERNAL ILIAC AND BILATERAL COMMON ILIAC STENT;  Surgeon: Rosetta Posner, MD;  Location: [facility];  Service: Vascular;  Laterality: Bilateral;  . JOINT REPLACEMENT     right knee replacement  . PATCH ANGIOPLASTY Bilateral 03/23/2015   Procedure: PATCH ANGIOPLASTY OF BILATERAL COMMON FEMORAL ARTERIES;  Surgeon: Rosetta Posner, MD;  Location: [facility];  Service: Vascular;  Laterality: Bilateral;  . PERIPHERAL VASCULAR CATHETERIZATION N/A 03/11/2015   Procedure: Abdominal Aortogram w/Lower Extremity;  Surgeon: Rosetta Posner, MD;  Location: [facility];  Service: Cardiovascular;  Laterality: N/A;  . TOTAL KNEE ARTHROPLASTY  07-13-2012   right  . UPPER GASTROINTESTINAL ENDOSCOPY  03/09/2009   Dr. Carlean Purl: H. pylori gastritis (Pylera Tx) and 2 cm hiatal hernia    SOCIAL HISTORY:   Social History   Social History  . Marital status: Married    Spouse name: N/A  . Number of children: 2  . Years of education: N/A   Occupational History  . Retired Retired     Press photographer   Social History Main Topics  . Smoking status: Current Every Day Smoker    Packs/day: 0.50    Years: 40.00    Types: Cigarettes  . Smokeless tobacco: Never Used  . Alcohol use 0.0 oz/week     Comment: 1 time per month  . Drug use: No  . Sexual activity: Not on file   Other Topics Concern  . Not on file   Social History Narrative  . No narrative on file    FAMILY HISTORY:   Family Status  Relation Status  . Father Deceased  . Mother Deceased  . Sister Alive  . Brother Alive  . Sister Deceased  . Brother Deceased  . Son Alive    ROS:  A complete 10 system review of systems was obtained and was unremarkable apart from what is mentioned above.  PHYSICAL EXAMINATION:    VITALS:   Vitals:   08/11/17 0958  BP: (!) 84/60  Pulse: 80  SpO2: 96%  Weight: 170 lb (77.1 kg)  Height: 6\' 1"  (1.854 m)    GEN:  The patient appears stated age  and is in NAD. HEENT:  Normocephalic, atraumatic.  The mucous membranes are moist. The superficial temporal arteries are without ropiness or tenderness. CV:  RRR Lungs:  CTAB Neck/HEME:  There are no carotid bruits bilaterally.  Neurological examination:  Orientation: The patient is not able to copy a cube.  Scores 2/4 on clock drawing.   MMSE - Mini Mental State Exam 08/11/2017  Orientation to time 5  Orientation to Place 5  Registration 3  Attention/  Calculation 3  Recall 0  Language- name 2 objects 2  Language- repeat 1  Language- follow 3 step command 3  Language- read & follow direction 1  Write a sentence 0  Copy design 0  Total score 23     Cranial nerves: There is good facial symmetry. Pupils are pinpoint and nonreactive.  Fundoscopic exam is attempted but the disc margins are not well visualized bilaterally.  Extraocular muscles are intact. The visual fields are full to confrontational testing. The speech is fluent and clear. Soft palate rises symmetrically and there is no tongue deviation. Hearing is intact to conversational tone. Sensation: Sensation is intact to light and pinprick throughout (facial, trunk, extremities). Vibration is absent at the bilateral big toe and ankle. There is no extinction with double simultaneous stimulation. There is no sensory dermatomal level identified. Motor: Strength is 5/5 in the bilateral upper and lower extremities.   Shoulder shrug is equal and symmetric.  There is no pronator drift. Deep tendon reflexes: Deep tendon reflexes are 2/4 at the bilateral biceps, triceps, brachioradialis, patella and Absent at the bilateral achilles. Plantar responses are downgoing bilaterally. Frontal release:  There are bilateral palmomental and glabellar tap signs  Movement examination: Tone: There is normal tone in the bilateral upper extremities.  The tone in the lower extremities is normal .  Abnormal movements: none Coordination:  There is no  decremation with RAM's, with any form of RAMS, including alternating supination and pronation of the forearm, hand opening and closing, finger taps, heel taps and toe taps.  There is apraxia with commands of coordination Gait and Station: The patient has mild difficulty arising out of a deep-seated chair without the use of the hands.  The patient ambulates with a wide based and ataxic gait.  He is very unbalanced.    Labs:    I reviewed lab work from his primary care physician dated 07/06/2017.  Sedimentation rate was 3.  Acetylcholine receptor antibodies were negative.  CPK was 48.  ASSESSMENT/PLAN:  1.  Memory loss  -I do think that this is multifactorial.  I think that the patient likely has dementia, likely vascular in nature.  I also think that depression likely contributes, based on the contributions of his family.  Neurocognitive testing could be of value.  His family would like to proceed, but the patient did not want to.  They can certainly discuss this is a family and let us know if they would like to proceed.  If not heard he done so, I would recommend B12, folate, RPR.  2.  Low blood pressure  -BP was very low today.  Ramipril likely contributes but he is likely on this for renal protection for DM.  He is also on metoprolol.  He is to follow-up with his primary care physician.  He is asymptomatic.  3.  Peripheral neuropathy  -The patient has clinical examination evidence of a diffuse peripheral neuropathy, which certainly can affect gait and balance.  We discussed safety associated with peripheral neuropathy.  We discussed balance therapy and the importance of ambulatory assistive device for balance assistance.  They found that balance therapy made him worse.  I think that joining a gym like the [employer] [facility] could be of value for him and I gave him a brochure about their services.  -I talked to the patient about using a walker at all times.  We talked about risks of not doing so.  -I see no  evidence of Parkinson's disease.  Reassurance was provided in this regard.  4.  The patient was not eager to follow back up here.  He can certainly let us know if he needs [place] in the future.  Much greater than 50% of this 60 minute visit was spent in follow-up with the patient and family.     Cc:  [name], MD

## 2017-08-11 ENCOUNTER — Ambulatory Visit (INDEPENDENT_AMBULATORY_CARE_PROVIDER_SITE_OTHER): Payer: Medicare Other | Admitting: Neurology

## 2017-08-11 ENCOUNTER — Encounter: Payer: Self-pay | Admitting: Neurology

## 2017-08-11 VITALS — BP 84/60 | HR 80 | Ht 73.0 in | Wt 170.0 lb

## 2017-08-11 DIAGNOSIS — I952 Hypotension due to drugs: Secondary | ICD-10-CM | POA: Diagnosis not present

## 2017-08-11 DIAGNOSIS — R413 Other amnesia: Secondary | ICD-10-CM

## 2017-08-11 DIAGNOSIS — I70213 Atherosclerosis of native arteries of extremities with intermittent claudication, bilateral legs: Secondary | ICD-10-CM | POA: Diagnosis not present

## 2017-08-11 DIAGNOSIS — E1142 Type 2 diabetes mellitus with diabetic polyneuropathy: Secondary | ICD-10-CM

## 2017-08-11 NOTE — Patient Instructions (Signed)
1. Let us know if you want to proceed with Neurocognitive testing.   2. Follow up as needed.

## 2017-08-12 DIAGNOSIS — L03012 Cellulitis of left finger: Secondary | ICD-10-CM | POA: Diagnosis not present

## 2017-08-16 DIAGNOSIS — Z6824 Body mass index (BMI) 24.0-24.9, adult: Secondary | ICD-10-CM | POA: Diagnosis not present

## 2017-08-16 DIAGNOSIS — L0889 Other specified local infections of the skin and subcutaneous tissue: Secondary | ICD-10-CM | POA: Diagnosis not present

## 2017-08-17 DIAGNOSIS — S61211A Laceration without foreign body of left index finger without damage to nail, initial encounter: Secondary | ICD-10-CM | POA: Diagnosis not present

## 2017-08-24 DIAGNOSIS — S61211A Laceration without foreign body of left index finger without damage to nail, initial encounter: Secondary | ICD-10-CM | POA: Insufficient documentation

## 2017-08-24 DIAGNOSIS — S61211D Laceration without foreign body of left index finger without damage to nail, subsequent encounter: Secondary | ICD-10-CM | POA: Diagnosis not present

## 2017-09-28 DIAGNOSIS — L97519 Non-pressure chronic ulcer of other part of right foot with unspecified severity: Secondary | ICD-10-CM | POA: Diagnosis not present

## 2017-09-28 DIAGNOSIS — G5762 Lesion of plantar nerve, left lower limb: Secondary | ICD-10-CM | POA: Diagnosis not present

## 2017-09-28 DIAGNOSIS — G5761 Lesion of plantar nerve, right lower limb: Secondary | ICD-10-CM | POA: Diagnosis not present

## 2017-09-28 DIAGNOSIS — M7751 Other enthesopathy of right foot: Secondary | ICD-10-CM | POA: Diagnosis not present

## 2017-09-28 DIAGNOSIS — B351 Tinea unguium: Secondary | ICD-10-CM | POA: Diagnosis not present

## 2017-09-28 DIAGNOSIS — M79675 Pain in left toe(s): Secondary | ICD-10-CM | POA: Diagnosis not present

## 2017-09-28 DIAGNOSIS — M7752 Other enthesopathy of left foot: Secondary | ICD-10-CM | POA: Diagnosis not present

## 2017-11-08 DIAGNOSIS — E7849 Other hyperlipidemia: Secondary | ICD-10-CM | POA: Diagnosis not present

## 2017-11-08 DIAGNOSIS — I10 Essential (primary) hypertension: Secondary | ICD-10-CM | POA: Diagnosis not present

## 2017-11-08 DIAGNOSIS — Z125 Encounter for screening for malignant neoplasm of prostate: Secondary | ICD-10-CM | POA: Diagnosis not present

## 2017-11-08 DIAGNOSIS — E1149 Type 2 diabetes mellitus with other diabetic neurological complication: Secondary | ICD-10-CM | POA: Diagnosis not present

## 2017-11-08 DIAGNOSIS — R82998 Other abnormal findings in urine: Secondary | ICD-10-CM | POA: Diagnosis not present

## 2017-11-16 DIAGNOSIS — Z1389 Encounter for screening for other disorder: Secondary | ICD-10-CM | POA: Diagnosis not present

## 2017-11-16 DIAGNOSIS — G3184 Mild cognitive impairment, so stated: Secondary | ICD-10-CM | POA: Diagnosis not present

## 2017-11-16 DIAGNOSIS — I2510 Atherosclerotic heart disease of native coronary artery without angina pectoris: Secondary | ICD-10-CM | POA: Diagnosis not present

## 2017-11-16 DIAGNOSIS — I739 Peripheral vascular disease, unspecified: Secondary | ICD-10-CM | POA: Diagnosis not present

## 2017-11-16 DIAGNOSIS — E7849 Other hyperlipidemia: Secondary | ICD-10-CM | POA: Diagnosis not present

## 2017-11-16 DIAGNOSIS — Z6825 Body mass index (BMI) 25.0-25.9, adult: Secondary | ICD-10-CM | POA: Diagnosis not present

## 2017-11-16 DIAGNOSIS — M48061 Spinal stenosis, lumbar region without neurogenic claudication: Secondary | ICD-10-CM | POA: Diagnosis not present

## 2017-11-16 DIAGNOSIS — M6281 Muscle weakness (generalized): Secondary | ICD-10-CM | POA: Diagnosis not present

## 2017-11-16 DIAGNOSIS — Z0000 Encounter for general adult medical examination without abnormal findings: Secondary | ICD-10-CM | POA: Diagnosis not present

## 2017-11-16 DIAGNOSIS — I10 Essential (primary) hypertension: Secondary | ICD-10-CM | POA: Diagnosis not present

## 2017-11-16 DIAGNOSIS — E1151 Type 2 diabetes mellitus with diabetic peripheral angiopathy without gangrene: Secondary | ICD-10-CM | POA: Diagnosis not present

## 2017-11-16 DIAGNOSIS — E1149 Type 2 diabetes mellitus with other diabetic neurological complication: Secondary | ICD-10-CM | POA: Diagnosis not present

## 2017-11-16 DIAGNOSIS — G629 Polyneuropathy, unspecified: Secondary | ICD-10-CM | POA: Diagnosis not present

## 2018-01-19 DIAGNOSIS — M19071 Primary osteoarthritis, right ankle and foot: Secondary | ICD-10-CM | POA: Diagnosis not present

## 2018-01-19 DIAGNOSIS — L97519 Non-pressure chronic ulcer of other part of right foot with unspecified severity: Secondary | ICD-10-CM | POA: Diagnosis not present

## 2018-01-23 DIAGNOSIS — L97519 Non-pressure chronic ulcer of other part of right foot with unspecified severity: Secondary | ICD-10-CM | POA: Diagnosis not present

## 2018-01-30 DIAGNOSIS — L97519 Non-pressure chronic ulcer of other part of right foot with unspecified severity: Secondary | ICD-10-CM | POA: Diagnosis not present

## 2018-02-04 NOTE — Progress Notes (Signed)
[name] Date of Birth: [birth year] Medical Record #[id number]  History of Present Illness: [name] is seen for follow up CAD. He presented with unstable angina in 2009 and cardiac catheterization demonstrated severe left main and three-vessel coronary disease. He underwent coronary bypass surgery by Dr. Roxan Hockey.   He is intolerant of statins due to myalgias. Also intolerant to Zetia.  In April 2016 he was seen by Dr Early for increased claudication. He underwent bilateral femoral endarterectomy and patch angioplasty and bilateral iliac stenting on 03/23/2015.   On follow up today he states he is doing very well. He denies any chest pain or SOB. He is primarily limited now by peripheral neuropathy which affects his balance. He now walks with a cane.  Claudication is not much of a concern now.   Current Outpatient Medications on File Prior to Visit  Medication Sig Dispense Refill  . amoxicillin-clavulanate (AUGMENTIN) 500-125 MG tablet Take 1 tablet by mouth 2 (two) times daily.    Marland Kitchen aspirin 81 MG tablet Take 81 mg by mouth daily.      . Coenzyme Q10 (CO Q-10 PO) Take 300 mg by mouth 2 (two) times a week. Take on Mon and Fri.    . metFORMIN (GLUCOPHAGE) 1000 MG tablet Take 1 tablet by mouth Daily.     . metoprolol succinate (TOPROL-XL) 50 MG 24 hr tablet Take 50 mg by mouth daily.  1  . ramipril (ALTACE) 5 MG capsule Take 1 tablet by mouth Daily.      No current facility-administered medications on file prior to visit.     Allergies  Allergen Reactions  . Sulfonamide Derivatives Hives    Past Medical History:  Diagnosis Date  . Arthritis   . CAD (coronary artery disease)   . Cataract   . Diabetes mellitus   . GERD (gastroesophageal reflux disease)   . Helicobacter pylori gastritis 01/2006, 03/2011   EGD - Pylera Tx 2010  . Hiccups   . History of kidney stones   . Hyperlipidemia   . Hypertension   . PAD (peripheral artery disease) ([place])     Past Surgical History:   Procedure Laterality Date  . CATARACT EXTRACTION     right  . COLONOSCOPY  2002, 2007, 06/09/2011   Dr. Evelina Bucy - [name], [place]: for FHx colon cancer, no adenomas; 2012: [facility] - normal  . CORONARY ARTERY BYPASS GRAFT  05-07-2008  . ENDARTERECTOMY FEMORAL Bilateral 03/23/2015   Procedure: ENDARTERECTOMY OF BILATERAL COMMON FEMORAL AND PROFUNDA ARTERIES;  Surgeon: Rosetta Posner, MD;  Location: [facility];  Service: Vascular;  Laterality: Bilateral;  . ESOPHAGOGASTRODUODENOSCOPY  02/20/06   Dr. Evelina Bucy, [name], [place]. pylori gastritis and GERD changes (pathology)  . EYE SURGERY Left    cataract  . HERNIA REPAIR  12/24/2010  . INSERTION OF ILIAC STENT Bilateral 03/23/2015   Procedure: LEFT EXTERNAL ILIAC AND BILATERAL COMMON ILIAC STENT;  Surgeon: Rosetta Posner, MD;  Location: [facility];  Service: Vascular;  Laterality: Bilateral;  . JOINT REPLACEMENT     right knee replacement  . PATCH ANGIOPLASTY Bilateral 03/23/2015   Procedure: PATCH ANGIOPLASTY OF BILATERAL COMMON FEMORAL ARTERIES;  Surgeon: Rosetta Posner, MD;  Location: [facility];  Service: Vascular;  Laterality: Bilateral;  . PERIPHERAL VASCULAR CATHETERIZATION N/A 03/11/2015   Procedure: Abdominal Aortogram w/Lower Extremity;  Surgeon: Rosetta Posner, MD;  Location: [facility];  Service: Cardiovascular;  Laterality: N/A;  . TOTAL KNEE ARTHROPLASTY  07-13-2012   right  .  UPPER GASTROINTESTINAL ENDOSCOPY  03/09/2009   Dr. Carlean Purl: H. pylori gastritis (Pylera Tx) and 2 cm hiatal hernia    Social History   Tobacco Use  Smoking Status Current Every Day Smoker  . Packs/day: 0.50  . Years: 40.00  . Pack years: 20.00  . Types: Cigarettes  Smokeless Tobacco Never Used    Social History   Substance and Sexual Activity  Alcohol Use Yes  . Alcohol/week: 0.0 oz   Comment: 1 time per month    Family History  Problem Relation Age of Onset  . Heart disease Father   . Diabetes Father   . Heart attack Father   . Hypertension Father   .  Varicose Veins Father   . Cancer Sister        colon  . Diabetes Sister   . Other Sister        varicose veins  . Heart attack Sister   . Heart disease Sister   . Hyperlipidemia Sister   . Hypertension Sister   . Varicose Veins Sister   . Cancer Brother        prostate  . Diabetes Brother   . Heart disease Brother   . Hyperlipidemia Brother   . Hypertension Brother   . Other Brother        varicose veins  . Heart attack Brother   . Varicose Veins Brother   . Cancer Sister        lukemia  . Myasthenia gravis Sister   . Colon cancer Sister   . Leukemia Sister   . Heart attack Brother   . Crohn's disease Son     Review of Systems: The review of systems is positive chronic back problems and imbalance.  All other systems were reviewed and are negative.  Physical Exam: BP 120/68 (BP Location: Left Arm, Patient Position: Sitting, Cuff Size: Normal)   Pulse 75   Ht 6\' 1"  (1.854 m)   Wt 181 lb (82.1 kg)   BMI 23.88 kg/m  GENERAL:  Well appearing HEENT:  PERRL, EOMI, sclera are clear. Oropharynx is clear. NECK:  No jugular venous distention, carotid upstroke brisk and symmetric, no bruits, no thyromegaly or adenopathy LUNGS:  Clear to auscultation bilaterally CHEST:  Unremarkable HEART:  RRR,  PMI not displaced or sustained,S1 and S2 within normal limits, no S3, no S4: no clicks, no rubs, no murmurs ABD:  Soft, nontender. BS +, no masses or bruits. No hepatomegaly, no splenomegaly EXT:  1 + pulses throughout, no edema, no cyanosis no clubbing SKIN:  Warm and dry.  No rashes NEURO:  Alert and oriented x 3. Cranial nerves II through XII intact. PSYCH:  Cognitively intact    LABORATORY DATA:  Lab Results  Component Value Date   WBC 10.2 03/24/2015   HGB 12.3 (L) 03/24/2015   HCT 36.0 (L) 03/24/2015   PLT 175 03/24/2015   GLUCOSE 122 (H) 03/24/2015   ALT 14 (L) 03/20/2015   AST 22 03/20/2015   NA 130 (L) 03/24/2015   K 4.3 03/24/2015   CL 97 (L) 03/24/2015    CREATININE 0.74 03/24/2015   BUN 11 03/24/2015   CO2 26 03/24/2015   TSH 1.156 02/16/2015   INR 1.13 03/20/2015   HGBA1C 7.3 (H) 03/20/2015   Labs dated 11/03/16: cholesterol 190, triglycerides 104, HDL 47, LDL 122. A1c 6.5%. CMET and TSH normal. Dated 11/08/17: Cholesterol 190, triglycerides 71, HDL 51, LDL 125. A1c 6.4%. Normal CBC, chemistries and TSH.  ECG today  demonstrates sinus rhythm with a rate of 75 beats per minute with first-degree AV block. PACs.  It is otherwise normal.I have personally reviewed and interpreted this study.    Assessment / Plan: 1. Coronary disease status post CABG in 2009. Currently asymptomatic.  Myoview study in 4/14 was low risk with a small fixed inferobasal defect. EF 64%. Continue aspirin, ACE inhibitor, and metoprolol.   2. Diabetes mellitus type 2. On metformin.  3. Hypertension, controlled.  4. Hyperlipidemia. Intolerant of statins and Zetia. We discussed starting a PCSK 9 inhibitor but he is not interested at this time.   5. Peripheral arterial disease s/p bilateral femoral endarterectomy and stenting of bilateral iliac arteries in May 2016.  Recommend  regular/daily walking program as much as possible.  6. AAA- small. 3.0 cm.  I will follow up in one year.

## 2018-02-05 ENCOUNTER — Encounter: Payer: Self-pay | Admitting: Cardiology

## 2018-02-05 ENCOUNTER — Ambulatory Visit (INDEPENDENT_AMBULATORY_CARE_PROVIDER_SITE_OTHER): Payer: Medicare Other | Admitting: Cardiology

## 2018-02-05 VITALS — BP 120/68 | HR 75 | Ht 73.0 in | Wt 181.0 lb

## 2018-02-05 DIAGNOSIS — I70213 Atherosclerosis of native arteries of extremities with intermittent claudication, bilateral legs: Secondary | ICD-10-CM

## 2018-02-05 DIAGNOSIS — I25810 Atherosclerosis of coronary artery bypass graft(s) without angina pectoris: Secondary | ICD-10-CM | POA: Diagnosis not present

## 2018-02-05 DIAGNOSIS — Z951 Presence of aortocoronary bypass graft: Secondary | ICD-10-CM

## 2018-02-05 DIAGNOSIS — E1159 Type 2 diabetes mellitus with other circulatory complications: Secondary | ICD-10-CM

## 2018-02-05 NOTE — Patient Instructions (Signed)
Continue your current therapy  I will see you in one year   

## 2018-02-06 DIAGNOSIS — L97519 Non-pressure chronic ulcer of other part of right foot with unspecified severity: Secondary | ICD-10-CM | POA: Diagnosis not present

## 2018-02-06 DIAGNOSIS — M869 Osteomyelitis, unspecified: Secondary | ICD-10-CM | POA: Diagnosis not present

## 2018-02-13 ENCOUNTER — Encounter (INDEPENDENT_AMBULATORY_CARE_PROVIDER_SITE_OTHER): Payer: Self-pay | Admitting: Orthopedic Surgery

## 2018-02-13 ENCOUNTER — Ambulatory Visit (INDEPENDENT_AMBULATORY_CARE_PROVIDER_SITE_OTHER): Payer: Medicare Other | Admitting: Orthopedic Surgery

## 2018-02-13 DIAGNOSIS — L97511 Non-pressure chronic ulcer of other part of right foot limited to breakdown of skin: Secondary | ICD-10-CM

## 2018-02-13 DIAGNOSIS — E1159 Type 2 diabetes mellitus with other circulatory complications: Secondary | ICD-10-CM

## 2018-02-13 DIAGNOSIS — I70213 Atherosclerosis of native arteries of extremities with intermittent claudication, bilateral legs: Secondary | ICD-10-CM | POA: Diagnosis not present

## 2018-02-13 DIAGNOSIS — M6701 Short Achilles tendon (acquired), right ankle: Secondary | ICD-10-CM | POA: Diagnosis not present

## 2018-02-13 NOTE — Progress Notes (Signed)
Office Visit Note   Patient: [name]           Date of Birth: 1935-07-10           MRN: [id number] Visit Date: 02/13/2018              Requested by: [name], MD [address] [place], [place] [id number] PCP: [name], MD  Chief Complaint  Patient presents with  . Right Foot - Pain      HPI: Patient is a 83 year old gentleman with diabetic insensate neuropathy peripheral vascular disease who presents with an ulcer over the medial border right third toe PIP joint.  Patient is undergone excellent conservative therapy and is currently on amoxicillin and presents at this time for initial evaluation.  Assessment & Plan: Visit Diagnoses:  1. Type 2 diabetes mellitus with vascular disease ([place])   2. Ulcer of toe of right foot, limited to breakdown of skin ([place])     Plan: Patient was given instruction and demonstrated heel cord stretching he will do this 5 times a day a minute at a time he was recommended to use a Mirena or alpaca wool sock to look away the moisture from his foot is spacer was placed in the second webspace.  Patient was instructed to get a pair of wider longer sneakers such as Hoka or new balance.  Patient currently is an bearing on the tip of his toes at the end of the shoe.  His foot is approximately  2 cm wider than his orthotics.  Follow-Up Instructions: Return in about 1 month (around 03/13/2018).   Ortho Exam  Patient is alert, oriented, no adenopathy, well-dressed, normal affect, normal respiratory effort. Examination patient does not have a strong palpable pulse the Doppler was used and he has a strong biphasic dorsalis pedis and posterior tibial pulse.  Patient has no sausage digit swelling of the toes there is no cellulitis.  Patient has a Waggoner grade 1 ulcer over the medial border third toe PIP joint second webspace.  There is no cellulitis no odor no drainage there is good granulation tissue at the base the wound is 5 mm in diameter 1 mm deep  there is no exposed bone or tendon.  Radiographs are reviewed which shows multiple lytic bony lesions throughout the forefoot.  The lytic lesions in the third toe do not seem diagnostic at this time for osteomyelitis.  Patient does have a N bearing ulcer on the tip of the second toe secondary to an bearing in his shoe the callus was removed and there is no ulcer. Patient has heel cord contracture with dorsiflexion about 10 degrees short of neutral with his knee extended. Imaging: No results found. No images are attached to the encounter.  Labs: Lab Results  Component Value Date   HGBA1C 7.3 (H) 03/20/2015   REPTSTATUS 12/23/2010 FINAL 12/21/2010   CULT NO STAPHYLOCOCCUS AUREUS ISOLATED 12/21/2010    @LABSALLVALUES (HGBA1)@  There is no height or weight on file to calculate BMI.  Orders:  No orders of the defined types were placed in this encounter.  No orders of the defined types were placed in this encounter.    Procedures: No procedures performed  Clinical Data: No additional findings.  ROS:  All other systems negative, except as noted in the HPI. Review of Systems  Objective: Vital Signs: There were no vitals taken for this visit.  Specialty Comments:  No specialty comments available.  PMFS History: Patient Active Problem List  Diagnosis Date Noted  . Type 2 diabetes mellitus with vascular disease ([place]) 11/04/2015  . Left carotid bruit 11/04/2015  . PVD with claudication - s/p PV surgery May 2016 03/23/2015  . Intractable hiccups 02/16/2015  . Dehydration 02/16/2015  . Hyponatremia 02/16/2015  . Perineal abscess 06/20/2013  . Hypertension   . Hx of CABG 2009   . Hyperlipidemia   . Chronic constipation 06/27/2012   Past Medical History:  Diagnosis Date  . Arthritis   . CAD (coronary artery disease)   . Cataract   . Diabetes mellitus   . GERD (gastroesophageal reflux disease)   . Helicobacter pylori gastritis 01/2006, 03/2011   EGD - Pylera Tx 2010  .  Hiccups   . History of kidney stones   . Hyperlipidemia   . Hypertension   . PAD (peripheral artery disease) ([facility])     Family History  Problem Relation Age of Onset  . Heart disease Father   . Diabetes Father   . Heart attack Father   . Hypertension Father   . Varicose Veins Father   . Cancer Sister        colon  . Diabetes Sister   . Other Sister        varicose veins  . Heart attack Sister   . Heart disease Sister   . Hyperlipidemia Sister   . Hypertension Sister   . Varicose Veins Sister   . Cancer Brother        prostate  . Diabetes Brother   . Heart disease Brother   . Hyperlipidemia Brother   . Hypertension Brother   . Other Brother        varicose veins  . Heart attack Brother   . Varicose Veins Brother   . Cancer Sister        lukemia  . Myasthenia gravis Sister   . Colon cancer Sister   . Leukemia Sister   . Heart attack Brother   . Crohn's disease Son     Past Surgical History:  Procedure Laterality Date  . CATARACT EXTRACTION     right  . COLONOSCOPY  2002, 2007, 06/09/2011   Dr. Evelina Bucy - [name], [place]: for FHx colon cancer, no adenomas; 2012: [facility] - normal  . CORONARY ARTERY BYPASS GRAFT  05-07-2008  . ENDARTERECTOMY FEMORAL Bilateral 03/23/2015   Procedure: ENDARTERECTOMY OF BILATERAL COMMON FEMORAL AND PROFUNDA ARTERIES;  Surgeon: Rosetta Posner, MD;  Location: [facility];  Service: Vascular;  Laterality: Bilateral;  . ESOPHAGOGASTRODUODENOSCOPY  02/20/06   Dr. Evelina Bucy, [name], [place]. pylori gastritis and GERD changes (pathology)  . EYE SURGERY Left    cataract  . HERNIA REPAIR  12/24/2010  . INSERTION OF ILIAC STENT Bilateral 03/23/2015   Procedure: LEFT EXTERNAL ILIAC AND BILATERAL COMMON ILIAC STENT;  Surgeon: Rosetta Posner, MD;  Location: [facility];  Service: Vascular;  Laterality: Bilateral;  . JOINT REPLACEMENT     right knee replacement  . PATCH ANGIOPLASTY Bilateral 03/23/2015   Procedure: PATCH ANGIOPLASTY OF BILATERAL COMMON FEMORAL ARTERIES;   Surgeon: Rosetta Posner, MD;  Location: [facility];  Service: Vascular;  Laterality: Bilateral;  . PERIPHERAL VASCULAR CATHETERIZATION N/A 03/11/2015   Procedure: Abdominal Aortogram w/Lower Extremity;  Surgeon: Rosetta Posner, MD;  Location: [facility];  Service: Cardiovascular;  Laterality: N/A;  . TOTAL KNEE ARTHROPLASTY  07-13-2012   right  . UPPER GASTROINTESTINAL ENDOSCOPY  03/09/2009   Dr. Carlean Purl: H. pylori gastritis (Pylera Tx) and 2 cm hiatal  hernia   Social History   Occupational History  . Occupation: Retired    Fish farm manager: RETIRED     Comment: sales  Tobacco Use  . Smoking status: Current Every Day Smoker    Packs/day: 0.50    Years: 40.00    Pack years: 20.00    Types: Cigarettes  . Smokeless tobacco: Never Used  Substance and Sexual Activity  . Alcohol use: Yes    Alcohol/week: 0.0 oz    Comment: 1 time per month  . Drug use: No  . Sexual activity: Not on file

## 2018-02-20 DIAGNOSIS — L97519 Non-pressure chronic ulcer of other part of right foot with unspecified severity: Secondary | ICD-10-CM | POA: Diagnosis not present

## 2018-03-08 DIAGNOSIS — H11002 Unspecified pterygium of left eye: Secondary | ICD-10-CM | POA: Diagnosis not present

## 2018-03-09 DIAGNOSIS — L97519 Non-pressure chronic ulcer of other part of right foot with unspecified severity: Secondary | ICD-10-CM | POA: Diagnosis not present

## 2018-03-09 DIAGNOSIS — L97511 Non-pressure chronic ulcer of other part of right foot limited to breakdown of skin: Secondary | ICD-10-CM | POA: Diagnosis not present

## 2018-03-15 ENCOUNTER — Ambulatory Visit (INDEPENDENT_AMBULATORY_CARE_PROVIDER_SITE_OTHER): Payer: Medicare Other | Admitting: Orthopedic Surgery

## 2018-03-19 ENCOUNTER — Ambulatory Visit (INDEPENDENT_AMBULATORY_CARE_PROVIDER_SITE_OTHER): Payer: Medicare Other | Admitting: Orthopedic Surgery

## 2018-03-19 DIAGNOSIS — H11002 Unspecified pterygium of left eye: Secondary | ICD-10-CM | POA: Diagnosis not present

## 2018-03-23 DIAGNOSIS — H0100B Unspecified blepharitis left eye, upper and lower eyelids: Secondary | ICD-10-CM | POA: Diagnosis not present

## 2018-03-23 DIAGNOSIS — H04123 Dry eye syndrome of bilateral lacrimal glands: Secondary | ICD-10-CM | POA: Diagnosis not present

## 2018-03-23 DIAGNOSIS — H11002 Unspecified pterygium of left eye: Secondary | ICD-10-CM | POA: Diagnosis not present

## 2018-04-03 DIAGNOSIS — L97511 Non-pressure chronic ulcer of other part of right foot limited to breakdown of skin: Secondary | ICD-10-CM | POA: Diagnosis not present

## 2018-04-24 DIAGNOSIS — M86171 Other acute osteomyelitis, right ankle and foot: Secondary | ICD-10-CM | POA: Diagnosis not present

## 2018-04-24 DIAGNOSIS — L97511 Non-pressure chronic ulcer of other part of right foot limited to breakdown of skin: Secondary | ICD-10-CM | POA: Diagnosis not present

## 2018-04-30 DIAGNOSIS — H6123 Impacted cerumen, bilateral: Secondary | ICD-10-CM | POA: Diagnosis not present

## 2018-04-30 DIAGNOSIS — R066 Hiccough: Secondary | ICD-10-CM | POA: Diagnosis not present

## 2018-04-30 DIAGNOSIS — Z6825 Body mass index (BMI) 25.0-25.9, adult: Secondary | ICD-10-CM | POA: Diagnosis not present

## 2018-05-16 DIAGNOSIS — K920 Hematemesis: Secondary | ICD-10-CM | POA: Diagnosis not present

## 2018-05-16 DIAGNOSIS — I2510 Atherosclerotic heart disease of native coronary artery without angina pectoris: Secondary | ICD-10-CM | POA: Diagnosis not present

## 2018-05-16 DIAGNOSIS — R066 Hiccough: Secondary | ICD-10-CM | POA: Diagnosis not present

## 2018-05-16 DIAGNOSIS — E1151 Type 2 diabetes mellitus with diabetic peripheral angiopathy without gangrene: Secondary | ICD-10-CM | POA: Diagnosis not present

## 2018-05-16 DIAGNOSIS — Z6825 Body mass index (BMI) 25.0-25.9, adult: Secondary | ICD-10-CM | POA: Diagnosis not present

## 2018-05-16 DIAGNOSIS — I739 Peripheral vascular disease, unspecified: Secondary | ICD-10-CM | POA: Diagnosis not present

## 2018-05-16 DIAGNOSIS — E11621 Type 2 diabetes mellitus with foot ulcer: Secondary | ICD-10-CM | POA: Diagnosis not present

## 2018-05-16 DIAGNOSIS — E1149 Type 2 diabetes mellitus with other diabetic neurological complication: Secondary | ICD-10-CM | POA: Diagnosis not present

## 2018-05-16 DIAGNOSIS — I10 Essential (primary) hypertension: Secondary | ICD-10-CM | POA: Diagnosis not present

## 2018-06-22 ENCOUNTER — Other Ambulatory Visit: Payer: Self-pay

## 2018-06-22 DIAGNOSIS — I739 Peripheral vascular disease, unspecified: Secondary | ICD-10-CM

## 2018-07-10 ENCOUNTER — Ambulatory Visit (INDEPENDENT_AMBULATORY_CARE_PROVIDER_SITE_OTHER)
Admission: RE | Admit: 2018-07-10 | Discharge: 2018-07-10 | Disposition: A | Discharge status: Home / Self Care | Payer: Medicare Other | Source: Ambulatory Visit | Attending: Vascular Surgery | Admitting: Vascular Surgery

## 2018-07-10 ENCOUNTER — Other Ambulatory Visit: Payer: Self-pay

## 2018-07-10 ENCOUNTER — Encounter: Payer: Self-pay | Admitting: Family

## 2018-07-10 ENCOUNTER — Ambulatory Visit (INDEPENDENT_AMBULATORY_CARE_PROVIDER_SITE_OTHER): Payer: Medicare Other | Admitting: Family

## 2018-07-10 ENCOUNTER — Ambulatory Visit (HOSPITAL_COMMUNITY)
Admission: RE | Admit: 2018-07-10 | Discharge: 2018-07-10 | Disposition: A | Discharge status: Home / Self Care | Payer: Medicare Other | Source: Ambulatory Visit | Attending: Vascular Surgery | Admitting: Vascular Surgery

## 2018-07-10 VITALS — BP 134/80 | HR 49 | Temp 97.0°F | Resp 20 | Ht 73.0 in | Wt 179.0 lb

## 2018-07-10 DIAGNOSIS — I714 Abdominal aortic aneurysm, without rupture, unspecified: Secondary | ICD-10-CM

## 2018-07-10 DIAGNOSIS — I779 Disorder of arteries and arterioles, unspecified: Secondary | ICD-10-CM

## 2018-07-10 DIAGNOSIS — I739 Peripheral vascular disease, unspecified: Secondary | ICD-10-CM | POA: Insufficient documentation

## 2018-07-10 DIAGNOSIS — F17200 Nicotine dependence, unspecified, uncomplicated: Secondary | ICD-10-CM | POA: Diagnosis not present

## 2018-07-10 NOTE — Progress Notes (Signed)
[facility] [place]   CC: Follow up peripheral artery occlusive disease  History of Present Illness [name] is a 83 y.o. male who is s/p bilateral femoral endarterectomy and patch angioplasty and bilateral iliac stenting on 03/23/2015 by Dr. Donnetta Hutching. He has done quite well since surgery. He did see Dr. Scot Dock was some concern of some fullness in his groins postop. This was seen with ultrasound and found to be a postoperative changes only. He is quite pleased with his result. He reports that he has was able to walk less than 1 block prior to surgery and had improved to walking 2 miles on the track, 3 days/week. He was also lifting weights. He reported that his stamina had improved a great deal.  He has a very complex past history. He also has a small infrarenal abdominal aortic aneurysm which is been followed with serial exams. He has lumbar disc disease and some arthritic changes; he has had ESI's in the past. He denies any abdominal pain, denies any new back pain.  The plantar surfaces of his feel sore at times, he attributes this to DM neuropathy, denies non healing wounds.   Pletal was stopped at a previous visit as pt had no more claudication symptoms at that time; he declines resumption of the Pletal to help his returned claudication.  He denies any history of stroke or TIA.  Dr. Donnetta Hutching evaluated pt on 07-26-16 with pt c/o pain and numbness in his left foot and that he had fallen in the prior 2 weeks. Pt was seen to rule out new acute arterial insufficiency. His main complaint was of numbness from his knee distally in his left foot. He reported that he has severe degenerative disc disease in his back and has been told that he is not an operative candidate.  On 07-26-16 pt underwent repeat noninvasive studies in our office and this was no change since his study in 06/28/2016. He has falsely elevated pressures related to calcification of 0.8 on the right and 0.87 on  the left with monophasic waveforms in the posterior tibial dorsalis pedis bilaterally. The patient has known SFA occlusion at its origin with reconstitution of his tibial vessels. Does not have any evidence that would suggest acute ischemia as the cause for his neurologic change. Dr. Donnetta Hutching suspects this is more related to primary neurologic issue possibly related to degenerative disc disease. Pt was comfortable with this information and will touch base with [name] for further direction will see him as scheduled for protocol follow-up of his arterial insufficiency.  Diabetic: Yes, pt states his last A1C was 6.0 Tobacco use: smoker (1/2 ppd, started smoking at age 70 yrs), he expressed no intent to quit  Pt meds include: Statin :No, has myalgias from statin Betablocker: Yes ASA: Yes Other anticoagulants/antiplatelets: no    Past Medical History:  Diagnosis Date  . Arthritis   . CAD (coronary artery disease)   . Cataract   . Diabetes mellitus   . GERD (gastroesophageal reflux disease)   . Helicobacter pylori gastritis 01/2006, 03/2011   EGD - Pylera Tx 2010  . Hiccups   . History of kidney stones   . Hyperlipidemia   . Hypertension   . PAD (peripheral artery disease) (HCC)     Social History Social History   Tobacco Use  . Smoking status: Current Every Day Smoker    Packs/day: 0.50    Years: 40.00    Pack years: 20.00  Types: Cigarettes  . Smokeless tobacco: Never Used  Substance Use Topics  . Alcohol use: Yes    Alcohol/week: 0.0 standard drinks    Comment: 1 time per month  . Drug use: No    Family History Family History  Problem Relation Age of Onset  . Heart disease Father   . Diabetes Father   . Heart attack Father   . Hypertension Father   . Varicose Veins Father   . Cancer Sister        colon  . Diabetes Sister   . Other Sister        varicose veins  . Heart attack Sister   . Heart disease Sister   . Hyperlipidemia Sister   . Hypertension Sister    . Varicose Veins Sister   . Cancer Brother        prostate  . Diabetes Brother   . Heart disease Brother   . Hyperlipidemia Brother   . Hypertension Brother   . Other Brother        varicose veins  . Heart attack Brother   . Varicose Veins Brother   . Cancer Sister        lukemia  . Myasthenia gravis Sister   . Colon cancer Sister   . Leukemia Sister   . Heart attack Brother   . Crohn's disease Son     Past Surgical History:  Procedure Laterality Date  . CATARACT EXTRACTION     right  . COLONOSCOPY  2002, 2007, 06/09/2011   Dr. Evelina Bucy - [name], [place]: for FHx colon cancer, no adenomas; 2012: [facility] - normal  . CORONARY ARTERY BYPASS GRAFT  05-07-2008  . ENDARTERECTOMY FEMORAL Bilateral 03/23/2015   Procedure: ENDARTERECTOMY OF BILATERAL COMMON FEMORAL AND PROFUNDA ARTERIES;  Surgeon: Rosetta Posner, MD;  Location: [facility];  Service: Vascular;  Laterality: Bilateral;  . ESOPHAGOGASTRODUODENOSCOPY  02/20/06   Dr. Evelina Bucy, [name], [place]. pylori gastritis and GERD changes (pathology)  . EYE SURGERY Left    cataract  . HERNIA REPAIR  12/24/2010  . INSERTION OF ILIAC STENT Bilateral 03/23/2015   Procedure: LEFT EXTERNAL ILIAC AND BILATERAL COMMON ILIAC STENT;  Surgeon: Rosetta Posner, MD;  Location: [facility];  Service: Vascular;  Laterality: Bilateral;  . JOINT REPLACEMENT     right knee replacement  . PATCH ANGIOPLASTY Bilateral 03/23/2015   Procedure: PATCH ANGIOPLASTY OF BILATERAL COMMON FEMORAL ARTERIES;  Surgeon: Rosetta Posner, MD;  Location: [facility];  Service: Vascular;  Laterality: Bilateral;  . PERIPHERAL VASCULAR CATHETERIZATION N/A 03/11/2015   Procedure: Abdominal Aortogram w/Lower Extremity;  Surgeon: Rosetta Posner, MD;  Location: [facility];  Service: Cardiovascular;  Laterality: N/A;  . TOTAL KNEE ARTHROPLASTY  07-13-2012   right  . UPPER GASTROINTESTINAL ENDOSCOPY  03/09/2009   Dr. Carlean Purl: H. pylori gastritis (Pylera Tx) and 2 cm hiatal hernia    Allergies   Allergen Reactions  . Sulfonamide Derivatives Hives    Current Outpatient Medications  Medication Sig Dispense Refill  . aspirin 81 MG tablet Take 81 mg by mouth daily.      . Coenzyme Q10 (CO Q-10 PO) Take 300 mg by mouth 2 (two) times a week. Take on Mon and Fri.    . metFORMIN (GLUCOPHAGE) 1000 MG tablet Take 1 tablet by mouth Daily.     . metoprolol tartrate (LOPRESSOR) 25 MG tablet Take 25 mg by mouth 2 (two) times daily.    . ramipril (ALTACE) 5 MG capsule Take  1 tablet by mouth Daily.      No current facility-administered medications for this visit.     ROS: See HPI for pertinent positives and negatives.   Physical Examination  Vitals:   07/10/18 1116 07/10/18 1123  BP: 132/68 134/80  Pulse: (!) 49   Resp: 20   Temp: (!) 97 F (36.1 C)   TempSrc: Oral   SpO2: 100%   Weight: 179 lb (81.2 kg)   Height: 6\' 1"  (1.854 m)    Body mass index is 23.62 kg/m.  General: A&O x 3, WDWN, male. Gait: using rolling walker with seat, slow, steady HENT: No gross abnormalities.  Eyes: PERRLA. Pulmonary: Respirations are non labored, CTAB, fair air movement in all fields Cardiac: regular rhythm, bradycardic (on a beta blocker), no detected murmur.         Carotid Bruits Right Left   Negative Positive    Radial pulses are 2+ palpable bilaterally   Adominal aortic pulse is not palpable                         VASCULAR EXAM: Extremities without ischemic changes, without Gangrene; without open wounds. Small varicosities in both lower legs, hemosiderin deposits around right ankle. Several toenails are thick and mycotic.                                                                                                           LE Pulses Right Left       FEMORAL  2+ palpable  2+ palpable        POPLITEAL  not palpable   not palpable       POSTERIOR TIBIAL  not palpable   not palpable        DORSALIS PEDIS      ANTERIOR TIBIAL not palpable  not palpable    Abdomen: soft,  NT, no palpable masses. Skin: no rashes, no cellulitis, no ulcers noted. Musculoskeletal: no muscle wasting or atrophy.  Neurologic: A&O X 3; appropriate affect, Sensation is normal; MOTOR FUNCTION:  moving all extremities equally, motor strength 5/5 throughout. Speech is fluent/normal. CN 2-12 intact except with mild hearing loss. Psychiatric: Thought content is normal, mood appropriate for clinical situation.     ASSESSMENT: NAZEER ROMNEY is a 83 y.o. male who is s/p bilateral femoral endarterectomy and patch angioplasty and bilateral iliac stenting on 03/23/2015. He does not seem to walk enough to elicit claudication symptoms, states his legs feel numb, he uses a rolling walker with a seat to help prevent falling.  He no longer participates in the graduated walking program.  He has a left carotid artery bruit, has no history of stroke or TIA. He also has a small infrarenal abdominal aortic aneurysm which is been followed with serial exams. He has no abdominal pain, denies any new back pain.   Fortunately his DM is in control, but unfortunately he continues to smoke and does not seem interested in quitting.  His atherosclerotic risk factors also include dyslipidemia with statin  intolerance, and CAD. He takes a daily ASA.   DATA  Aortoiliac Duplex (07-10-18): Limited visualization of the proximal abdominal aorta, mid abdominal aorta, and distal abdominal aorta due to excessive bowel gas and patient movement.  Highest velocity in the right iliac artery system was 226 cm/s PSV at the mid CIA. Highest velocity in the left iliac artery system was 235 cm/s in the mid EIA. Monophasic waveforms throughput.   06-28-16 aortoiliac Duplex suggested a  3.2 cm mid aortic aneurysm, no significant change from 06/01/15. Limited visualization of the abdominal vasculature and stents due to overlying bowel gas. No significant stenosis of the bilateral iliac stents, based on limited visualization, biphasic  waveforms throughout.    ABI (Date: 07/10/2018):  R:   ABI: 1.38 (was 0.80 on 07-26-16),   PT: mono  DP: mono  TBI:  0.38, toe pressure 48 (was 0.39)  L:   ABI: 1.14 (was 0.87),   PT: mono  DP: mono  TBI: 0.36, toe pressure 45 (was 0.39) Falsely elevated bilateral ABI with all monophasic waveforms suggest calcified arterial walls; waveforms were all monophasic on 07-26-16.  Bilateral TBI's remain stable and abnormally low.     06-28-16 Carotid Duplex suggests no CCA stenosis, no ECA stenosis, no ICA stenosis bilaterally. Both vertebral arteries remain antegrade, both subclavian artery waveforms remain multiphasic.     PLAN:  Pt wife requested referral for pt to podiatrist to trim his thick toenails and perform DM feet exams; referral given.  The patient was counseled re smoking cessation and declined several free resources re smoking cessation. Based on the patient's vascular studies and examination, pt will return to clinic in 1 year with ABI's and AAA duplex.   Daily seated leg exercises demonstrated and discussed.   I discussed in depth with the patient the nature of atherosclerosis, and emphasized the importance of maximal medical management including strict control of blood pressure, blood glucose, and lipid levels, obtaining regular exercise, and cessation of smoking.  The patient is aware that without maximal medical management the underlying atherosclerotic disease process will progress, limiting the benefit of any interventions.  The patient was given information about PAD including signs, symptoms, treatment, what symptoms should prompt the patient to seek immediate medical care, and risk reduction measures to take.  Stephen Chambers, RN, MSN, FNP-C [facility] Phone: [phone]  Clinic MD: Bishop Dublin  07/10/18 11:32 AM

## 2018-07-10 NOTE — Patient Instructions (Signed)
Steps to Quit Smoking Smoking tobacco can be bad for your health. It can also affect almost every organ in your body. Smoking puts you and people around you at risk for many serious long-lasting (chronic) diseases. Quitting smoking is hard, but it is one of the best things that you can do for your health. It is never too late to quit. What are the benefits of quitting smoking? When you quit smoking, you lower your risk for getting serious diseases and conditions. They can include:  Lung cancer or lung disease.  Heart disease.  Stroke.  Heart attack.  Not being able to have children (infertility).  Weak bones (osteoporosis) and broken bones (fractures).  If you have coughing, wheezing, and shortness of breath, those symptoms may get better when you quit. You may also get sick less often. If you are pregnant, quitting smoking can help to lower your chances of having a baby of low birth weight. What can I do to help me quit smoking? Talk with your doctor about what can help you quit smoking. Some things you can do (strategies) include:  Quitting smoking totally, instead of slowly cutting back how much you smoke over a period of time.  Going to in-person counseling. You are more likely to quit if you go to many counseling sessions.  Using resources and support systems, such as: ? Online chats with a counselor. ? Phone quitlines. ? Printed self-help materials. ? Support groups or group counseling. ? Text messaging programs. ? Mobile phone apps or applications.  Taking medicines. Some of these medicines may have nicotine in them. If you are pregnant or breastfeeding, do not take any medicines to quit smoking unless your doctor says it is okay. Talk with your doctor about counseling or other things that can help you.  Talk with your doctor about using more than one strategy at the same time, such as taking medicines while you are also going to in-person counseling. This can help make  quitting easier. What things can I do to make it easier to quit? Quitting smoking might feel very hard at first, but there is a lot that you can do to make it easier. Take these steps:  Talk to your family and friends. Ask them to support and encourage you.  Call phone quitlines, reach out to support groups, or work with a counselor.  Ask people who smoke to not smoke around you.  Avoid places that make you want (trigger) to smoke, such as: ? Bars. ? Parties. ? Smoke-break areas at work.  Spend time with people who do not smoke.  Lower the stress in your life. Stress can make you want to smoke. Try these things to help your stress: ? Getting regular exercise. ? Deep-breathing exercises. ? Yoga. ? Meditating. ? Doing a body scan. To do this, close your eyes, focus on one area of your body at a time from head to toe, and notice which parts of your body are tense. Try to relax the muscles in those areas.  Download or buy apps on your mobile phone or tablet that can help you stick to your quit plan. There are many free apps, such as QuitGuide from the CDC (Centers for Disease Control and Prevention). You can find more support from smokefree.gov and other websites.  This information is not intended to replace advice given to you by your health care provider. Make sure you discuss any questions you have with your health care provider. Document Released: 08/13/2009 Document   Revised: 06/14/2016 Document Reviewed: 03/03/2015 Elsevier Interactive Patient Education  2018 Elsevier Inc.     Peripheral Vascular Disease Peripheral vascular disease (PVD) is a disease of the blood vessels that are not part of your heart and brain. A simple term for PVD is poor circulation. In most cases, PVD narrows the blood vessels that carry blood from your heart to the rest of your body. This can result in a decreased supply of blood to your arms, legs, and internal organs, like your stomach or kidneys.  However, it most often affects a person's lower legs and feet. There are two types of PVD.  Organic PVD. This is the more common type. It is caused by damage to the structure of blood vessels.  Functional PVD. This is caused by conditions that make blood vessels contract and tighten (spasm).  Without treatment, PVD tends to get worse over time. PVD can also lead to acute ischemic limb. This is when an arm or limb suddenly has trouble getting enough blood. This is a medical emergency. Follow these instructions at home:  Take medicines only as told by your doctor.  Do not use any tobacco products, including cigarettes, chewing tobacco, or electronic cigarettes. If you need help quitting, ask your doctor.  Lose weight if you are overweight, and maintain a healthy weight as told by your doctor.  Eat a diet that is low in fat and cholesterol. If you need help, ask your doctor.  Exercise regularly. Ask your doctor for some good activities for you.  Take good care of your feet. ? Wear comfortable shoes that fit well. ? Check your feet often for any cuts or sores. Contact a doctor if:  You have cramps in your legs while walking.  You have leg pain when you are at rest.  You have coldness in a leg or foot.  Your skin changes.  You are unable to get or have an erection (erectile dysfunction).  You have cuts or sores on your feet that are not healing. Get help right away if:  Your arm or leg turns cold and blue.  Your arms or legs become red, warm, swollen, painful, or numb.  You have chest pain or trouble breathing.  You suddenly have weakness in your face, arm, or leg.  You become very confused or you cannot speak.  You suddenly have a very bad headache.  You suddenly cannot see. This information is not intended to replace advice given to you by your health care provider. Make sure you discuss any questions you have with your health care provider. Document Released:  01/11/2010 Document Revised: 03/24/2016 Document Reviewed: 03/27/2014 Elsevier Interactive Patient Education  2017 Elsevier Inc.  

## 2018-07-12 ENCOUNTER — Encounter: Payer: Self-pay | Admitting: Podiatry

## 2018-07-12 ENCOUNTER — Ambulatory Visit (INDEPENDENT_AMBULATORY_CARE_PROVIDER_SITE_OTHER): Payer: Medicare Other | Admitting: Podiatry

## 2018-07-12 DIAGNOSIS — M79674 Pain in right toe(s): Secondary | ICD-10-CM | POA: Diagnosis not present

## 2018-07-12 DIAGNOSIS — L84 Corns and callosities: Secondary | ICD-10-CM | POA: Diagnosis not present

## 2018-07-12 DIAGNOSIS — M2010 Hallux valgus (acquired), unspecified foot: Secondary | ICD-10-CM

## 2018-07-12 DIAGNOSIS — B351 Tinea unguium: Secondary | ICD-10-CM | POA: Diagnosis not present

## 2018-07-12 DIAGNOSIS — M2041 Other hammer toe(s) (acquired), right foot: Secondary | ICD-10-CM

## 2018-07-12 DIAGNOSIS — M79675 Pain in left toe(s): Secondary | ICD-10-CM | POA: Diagnosis not present

## 2018-07-12 DIAGNOSIS — I739 Peripheral vascular disease, unspecified: Secondary | ICD-10-CM | POA: Diagnosis not present

## 2018-07-12 DIAGNOSIS — I779 Disorder of arteries and arterioles, unspecified: Secondary | ICD-10-CM

## 2018-07-12 DIAGNOSIS — M2042 Other hammer toe(s) (acquired), left foot: Secondary | ICD-10-CM

## 2018-07-12 DIAGNOSIS — E1149 Type 2 diabetes mellitus with other diabetic neurological complication: Secondary | ICD-10-CM | POA: Diagnosis not present

## 2018-07-12 NOTE — Progress Notes (Signed)
Subjective:    Patient ID: [name], male    DOB: 05/21/1935, 83 y.o.   MRN: [id number]  HPI  83 year old male presents the office today for concerns of very thick, elongated toenails that he cannot trim himself.  He has had a wound to the right second toe which is been treated for by Dr. Gershon Mussel was antibiotics for 3 months before the area healed.  He was referred to [place] by vascular surgery as Dr. Gershon Mussel does not cut toenails.  He also has a bunion that causes irritation on the right side.  Currently denies any ulcerations.  No other concerns today.  He has a history of stents to his lower extremities he also has neuropathy.    Review of Systems  All other systems reviewed and are negative.  Past Medical History:  Diagnosis Date  . Arthritis   . CAD (coronary artery disease)   . Cataract   . Diabetes mellitus   . GERD (gastroesophageal reflux disease)   . Helicobacter pylori gastritis 01/2006, 03/2011   EGD - Pylera Tx 2010  . Hiccups   . History of kidney stones   . Hyperlipidemia   . Hypertension   . PAD (peripheral artery disease) ([facility])     Past Surgical History:  Procedure Laterality Date  . CATARACT EXTRACTION     right  . COLONOSCOPY  2002, 2007, 06/09/2011   Dr. Evelina Bucy - [name], [place]: for FHx colon cancer, no adenomas; 2012: [facility] - normal  . CORONARY ARTERY BYPASS GRAFT  05-07-2008  . ENDARTERECTOMY FEMORAL Bilateral 03/23/2015   Procedure: ENDARTERECTOMY OF BILATERAL COMMON FEMORAL AND PROFUNDA ARTERIES;  Surgeon: Rosetta Posner, MD;  Location: [facility];  Service: Vascular;  Laterality: Bilateral;  . ESOPHAGOGASTRODUODENOSCOPY  02/20/06   Dr. Evelina Bucy, [name], [place]. pylori gastritis and GERD changes (pathology)  . EYE SURGERY Left    cataract  . HERNIA REPAIR  12/24/2010  . INSERTION OF ILIAC STENT Bilateral 03/23/2015   Procedure: LEFT EXTERNAL ILIAC AND BILATERAL COMMON ILIAC STENT;  Surgeon: Rosetta Posner, MD;  Location: [facility];  Service: Vascular;  Laterality:  Bilateral;  . JOINT REPLACEMENT     right knee replacement  . PATCH ANGIOPLASTY Bilateral 03/23/2015   Procedure: PATCH ANGIOPLASTY OF BILATERAL COMMON FEMORAL ARTERIES;  Surgeon: Rosetta Posner, MD;  Location: [facility];  Service: Vascular;  Laterality: Bilateral;  . PERIPHERAL VASCULAR CATHETERIZATION N/A 03/11/2015   Procedure: Abdominal Aortogram w/Lower Extremity;  Surgeon: Rosetta Posner, MD;  Location: [facility];  Service: Cardiovascular;  Laterality: N/A;  . TOTAL KNEE ARTHROPLASTY  07-13-2012   right  . UPPER GASTROINTESTINAL ENDOSCOPY  03/09/2009   Dr. Carlean Purl: H. pylori gastritis (Pylera Tx) and 2 cm hiatal hernia     Current Outpatient Medications:  .  aspirin 81 MG tablet, Take 81 mg by mouth daily.  , Disp: , Rfl:  .  Coenzyme Q10 (CO Q-10 PO), Take 300 mg by mouth 2 (two) times a week. Take on Mon and Fri., Disp: , Rfl:  .  metFORMIN (GLUCOPHAGE) 1000 MG tablet, Take 1 tablet by mouth Daily. , Disp: , Rfl:  .  metoprolol tartrate (LOPRESSOR) 25 MG tablet, Take 25 mg by mouth 2 (two) times daily., Disp: , Rfl:  .  ramipril (ALTACE) 5 MG capsule, Take 1 tablet by mouth Daily. , Disp: , Rfl:   Allergies  Allergen Reactions  . Rosuvastatin   . Sulfonamide Derivatives Hives  Objective:   Physical Exam  General: AAO x3, NAD  Dermatological: Nails are hypertrophic, dystrophic, brittle, discolored, elongated 10. No surrounding redness or drainage. Tenderness nails 1-5 bilaterally.  Occlusion of the distal right second toe.  Upon debridement no underlying ulceration drainage or any signs of infection.  Skin irritation along the bunion on the right side but there is no skin breakdown.  No open lesions or pre-ulcerative lesions are identified today.  Vascular: Dorsalis Pedis artery and Posterior Tibial artery pedal pulses are 2/4 bilateral with immedate capillary fill time.  There is no pain with calf compression, swelling, warmth, erythema.   Neruologic: Sensation  decreased with Derrel Nip monofilament.  Musculoskeletal: Significant bunion deformities present bilaterally there is erythema on the right first metatarsal head from irritation from shoes but there is no skin breakdown.  Hammertoe contractures are present.    Gait: Unassisted, Nonantalgic    Assessment & Plan:  83 year old male with symptomatic hyperkeratotic lesion with ulceration, onychomycosis -Treatment options discussed including all alternatives, risks, and complications -Etiology of symptoms were discussed -Nails debrided 10 without complications or bleeding. -Hyperkeratotic lesion sharply debrided x1 without any complications or bleeding. -Given his history of diabetes with neuropathy and PAD, history of ulceration and digital deformity I do think he will benefit from diabetic shoes.  I will have him follow-up with [facility] for this.  Paperwork for for precertification. -Daily foot inspection -Follow-up in 3 months or sooner if any problems arise. In the meantime, encouraged to call the office with any questions, concerns, change in symptoms.   Celesta Gentile, DPM

## 2018-07-23 ENCOUNTER — Ambulatory Visit: Payer: Medicare Other | Admitting: Orthotics

## 2018-07-23 DIAGNOSIS — L84 Corns and callosities: Secondary | ICD-10-CM

## 2018-07-23 DIAGNOSIS — M2041 Other hammer toe(s) (acquired), right foot: Secondary | ICD-10-CM

## 2018-07-23 DIAGNOSIS — M2042 Other hammer toe(s) (acquired), left foot: Secondary | ICD-10-CM

## 2018-07-23 DIAGNOSIS — M2010 Hallux valgus (acquired), unspecified foot: Secondary | ICD-10-CM

## 2018-07-23 DIAGNOSIS — I739 Peripheral vascular disease, unspecified: Secondary | ICD-10-CM

## 2018-07-23 DIAGNOSIS — E1149 Type 2 diabetes mellitus with other diabetic neurological complication: Secondary | ICD-10-CM

## 2018-07-23 NOTE — Progress Notes (Signed)

## 2018-07-31 DIAGNOSIS — Z23 Encounter for immunization: Secondary | ICD-10-CM | POA: Diagnosis not present

## 2018-08-21 ENCOUNTER — Ambulatory Visit: Payer: Medicare Other | Admitting: Orthotics

## 2018-08-28 ENCOUNTER — Ambulatory Visit (INDEPENDENT_AMBULATORY_CARE_PROVIDER_SITE_OTHER): Payer: Medicare Other | Admitting: Orthotics

## 2018-08-28 DIAGNOSIS — M2041 Other hammer toe(s) (acquired), right foot: Secondary | ICD-10-CM

## 2018-08-28 DIAGNOSIS — L84 Corns and callosities: Secondary | ICD-10-CM | POA: Diagnosis not present

## 2018-08-28 DIAGNOSIS — M2010 Hallux valgus (acquired), unspecified foot: Secondary | ICD-10-CM

## 2018-08-28 DIAGNOSIS — E1149 Type 2 diabetes mellitus with other diabetic neurological complication: Secondary | ICD-10-CM

## 2018-08-28 DIAGNOSIS — M2042 Other hammer toe(s) (acquired), left foot: Secondary | ICD-10-CM

## 2018-08-28 DIAGNOSIS — I739 Peripheral vascular disease, unspecified: Secondary | ICD-10-CM

## 2018-08-28 DIAGNOSIS — E1159 Type 2 diabetes mellitus with other circulatory complications: Secondary | ICD-10-CM

## 2018-08-28 NOTE — Progress Notes (Signed)

## 2018-09-11 ENCOUNTER — Ambulatory Visit: Payer: Medicare Other | Admitting: Podiatry

## 2018-09-13 ENCOUNTER — Ambulatory Visit (INDEPENDENT_AMBULATORY_CARE_PROVIDER_SITE_OTHER): Payer: Medicare Other | Admitting: Podiatry

## 2018-09-13 ENCOUNTER — Ambulatory Visit (INDEPENDENT_AMBULATORY_CARE_PROVIDER_SITE_OTHER): Payer: Medicare Other | Admitting: Orthotics

## 2018-09-13 DIAGNOSIS — B351 Tinea unguium: Secondary | ICD-10-CM

## 2018-09-13 DIAGNOSIS — E1149 Type 2 diabetes mellitus with other diabetic neurological complication: Secondary | ICD-10-CM

## 2018-09-13 DIAGNOSIS — L84 Corns and callosities: Secondary | ICD-10-CM | POA: Diagnosis not present

## 2018-09-13 DIAGNOSIS — M79675 Pain in left toe(s): Secondary | ICD-10-CM | POA: Diagnosis not present

## 2018-09-13 DIAGNOSIS — M79674 Pain in right toe(s): Secondary | ICD-10-CM

## 2018-09-13 DIAGNOSIS — E1159 Type 2 diabetes mellitus with other circulatory complications: Secondary | ICD-10-CM

## 2018-09-13 DIAGNOSIS — M2042 Other hammer toe(s) (acquired), left foot: Secondary | ICD-10-CM

## 2018-09-13 DIAGNOSIS — M2041 Other hammer toe(s) (acquired), right foot: Secondary | ICD-10-CM

## 2018-09-13 DIAGNOSIS — M2010 Hallux valgus (acquired), unspecified foot: Secondary | ICD-10-CM

## 2018-09-13 NOTE — Progress Notes (Signed)

## 2018-09-14 NOTE — Progress Notes (Signed)
Subjective: 83 y.o. returns the office today for painful, elongated, thickened toenails which he cannot trim himself. Denies any redness or drainage around the nails. He is scheduled to see Liliane Channel today to PUO.  Denies any acute changes since last appointment and no new complaints today. Denies any systemic complaints such as fevers, chills, nausea, vomiting.   PCP: [name], MD  Objective: AAO 3, NAD DP/PT pulses decreased, CRT less than 3 seconds Nails hypertrophic, dystrophic, elongated, brittle, discolored 10. There is tenderness overlying the nails 1-5 bilaterally. There is no surrounding erythema or drainage along the nail sites. Hyperkeratotic lesion right distal second toe.  Upon debridement no underlying ulceration, drainage or any signs of infection noted today. No open lesions or pre-ulcerative lesions are identified. No other areas of tenderness bilateral lower extremities. No overlying edema, erythema, increased warmth. No pain with calf compression, swelling, warmth, erythema.  Assessment: Patient presents with symptomatic onychomycosis, hyperkeratotic lesion, type 2 diabetes with PVD  Plan: -Treatment options including alternatives, risks, complications were discussed -Nails sharply debrided 10 without complication/bleeding. -Hyperkeratotic lesion sharply debrided x1 without any complications or bleeding. -Discussed daily foot inspection. If there are any changes, to call the office immediately.  -Follow-up in 3 months or sooner if any problems are to arise. In the meantime, encouraged to call the office with any questions, concerns, changes symptoms.  Celesta Gentile, DPM

## 2018-11-13 ENCOUNTER — Ambulatory Visit: Payer: Medicare Other | Admitting: Podiatry

## 2018-11-14 DIAGNOSIS — E7849 Other hyperlipidemia: Secondary | ICD-10-CM | POA: Diagnosis not present

## 2018-11-14 DIAGNOSIS — Z125 Encounter for screening for malignant neoplasm of prostate: Secondary | ICD-10-CM | POA: Diagnosis not present

## 2018-11-14 DIAGNOSIS — E1149 Type 2 diabetes mellitus with other diabetic neurological complication: Secondary | ICD-10-CM | POA: Diagnosis not present

## 2018-11-14 DIAGNOSIS — R82998 Other abnormal findings in urine: Secondary | ICD-10-CM | POA: Diagnosis not present

## 2018-11-19 ENCOUNTER — Encounter: Payer: Self-pay | Admitting: Podiatry

## 2018-11-19 ENCOUNTER — Ambulatory Visit (INDEPENDENT_AMBULATORY_CARE_PROVIDER_SITE_OTHER): Payer: Medicare Other | Admitting: Podiatry

## 2018-11-19 DIAGNOSIS — M79674 Pain in right toe(s): Secondary | ICD-10-CM | POA: Diagnosis not present

## 2018-11-19 DIAGNOSIS — L84 Corns and callosities: Secondary | ICD-10-CM

## 2018-11-19 DIAGNOSIS — M79675 Pain in left toe(s): Secondary | ICD-10-CM

## 2018-11-19 DIAGNOSIS — B351 Tinea unguium: Secondary | ICD-10-CM

## 2018-11-19 DIAGNOSIS — E1159 Type 2 diabetes mellitus with other circulatory complications: Secondary | ICD-10-CM

## 2018-11-19 MED ORDER — CLOTRIMAZOLE-BETAMETHASONE 1-0.05 % EX CREA: 1.0000 "application " | TOPICAL_CREAM | Freq: Two times a day (BID) | CUTANEOUS | 0 refills | Status: DC

## 2018-11-21 DIAGNOSIS — I2510 Atherosclerotic heart disease of native coronary artery without angina pectoris: Secondary | ICD-10-CM | POA: Diagnosis not present

## 2018-11-21 DIAGNOSIS — E1151 Type 2 diabetes mellitus with diabetic peripheral angiopathy without gangrene: Secondary | ICD-10-CM | POA: Diagnosis not present

## 2018-11-21 DIAGNOSIS — Z1389 Encounter for screening for other disorder: Secondary | ICD-10-CM | POA: Diagnosis not present

## 2018-11-21 DIAGNOSIS — E1149 Type 2 diabetes mellitus with other diabetic neurological complication: Secondary | ICD-10-CM | POA: Diagnosis not present

## 2018-11-21 DIAGNOSIS — I7389 Other specified peripheral vascular diseases: Secondary | ICD-10-CM | POA: Diagnosis not present

## 2018-11-21 DIAGNOSIS — E7849 Other hyperlipidemia: Secondary | ICD-10-CM | POA: Diagnosis not present

## 2018-11-21 DIAGNOSIS — Z6826 Body mass index (BMI) 26.0-26.9, adult: Secondary | ICD-10-CM | POA: Diagnosis not present

## 2018-11-21 DIAGNOSIS — I10 Essential (primary) hypertension: Secondary | ICD-10-CM | POA: Diagnosis not present

## 2018-11-21 DIAGNOSIS — G6289 Other specified polyneuropathies: Secondary | ICD-10-CM | POA: Diagnosis not present

## 2018-11-21 DIAGNOSIS — G3184 Mild cognitive impairment, so stated: Secondary | ICD-10-CM | POA: Diagnosis not present

## 2018-11-21 DIAGNOSIS — Z0000 Encounter for general adult medical examination without abnormal findings: Secondary | ICD-10-CM | POA: Diagnosis not present

## 2018-11-21 DIAGNOSIS — M6281 Muscle weakness (generalized): Secondary | ICD-10-CM | POA: Diagnosis not present

## 2018-11-26 NOTE — Progress Notes (Signed)
Subjective: 84 y.o. returns the office today for painful, elongated, thickened toenails which he cannot trim himself as well as for calluses to his feet.  Denies any redness or drainage from the toenail or callus sites.  Denies any acute changes since last appointment and no new complaints today. Denies any systemic complaints such as fevers, chills, nausea, vomiting.   He sees vascular surgery for PAD.  He has had bilateral femoral endarterectomy and patch angioplasty and bilateral iliac stenting on Mar 23, 2015.  PCP: Stephen Redwood, MD  Objective: AAO 3, NAD DP/PT pulses decreased, CRT less than 3 seconds Nails hypertrophic, dystrophic, elongated, brittle, discolored 10. There is tenderness overlying the nails 1-5 bilaterally. There is no surrounding erythema or drainage along the nail sites. Hyperkeratotic lesion right distal second toe.  Upon debridement no underlying ulceration, drainage or any signs of infection noted today. No open lesions or pre-ulcerative lesions are identified. No other areas of tenderness bilateral lower extremities. No overlying edema, erythema, increased warmth. No pain with calf compression, swelling, warmth, erythema.  Assessment: Patient presents with symptomatic onychomycosis, hyperkeratotic lesion, type 2 diabetes with PVD  Plan: -Treatment options including alternatives, risks, complications were discussed -Nails sharply debrided 10 without complication/bleeding. -Hyperkeratotic lesion sharply debrided x1 without any complications or bleeding. -Discussed daily foot inspection. If there are any changes, to call the office immediately.  -Follow-up in 3 months or sooner if any problems are to arise. In the meantime, encouraged to call the office with any questions, concerns, changes symptoms.  Celesta Gentile, DPM

## 2019-01-21 ENCOUNTER — Ambulatory Visit: Payer: Medicare Other | Admitting: Podiatry

## 2019-02-18 ENCOUNTER — Ambulatory Visit: Payer: Medicare Other | Admitting: Podiatry

## 2019-03-29 ENCOUNTER — Ambulatory Visit (INDEPENDENT_AMBULATORY_CARE_PROVIDER_SITE_OTHER): Payer: Medicare Other | Admitting: Podiatry

## 2019-03-29 ENCOUNTER — Other Ambulatory Visit: Payer: Self-pay

## 2019-03-29 ENCOUNTER — Encounter: Payer: Self-pay | Admitting: Podiatry

## 2019-03-29 VITALS — Temp 96.6°F

## 2019-03-29 DIAGNOSIS — B353 Tinea pedis: Secondary | ICD-10-CM

## 2019-03-29 DIAGNOSIS — L84 Corns and callosities: Secondary | ICD-10-CM

## 2019-03-29 DIAGNOSIS — B351 Tinea unguium: Secondary | ICD-10-CM

## 2019-03-29 DIAGNOSIS — M79674 Pain in right toe(s): Secondary | ICD-10-CM

## 2019-03-29 DIAGNOSIS — E1151 Type 2 diabetes mellitus with diabetic peripheral angiopathy without gangrene: Secondary | ICD-10-CM | POA: Diagnosis not present

## 2019-03-29 MED ORDER — CICLOPIROX OLAMINE 0.77 % EX CREA: TOPICAL_CREAM | CUTANEOUS | 1 refills | Status: DC

## 2019-03-29 NOTE — Patient Instructions (Addendum)
Athlete's Foot  Athlete's foot (tinea pedis) is a fungal infection of the skin on your feet. It often occurs on the skin that is between or underneath the toes. It can also occur on the soles of your feet. The infection can spread from person to person (is contagious). It can also spread when a person's bare feet come in contact with the fungus on shower floors or on items such as shoes. What are the causes? This condition is caused by a fungus that grows in warm, moist places. You can get athlete's foot by sharing shoes, shower stalls, towels, and wet floors with someone who is infected. Not washing your feet or changing your socks often enough can also lead to athlete's foot. What increases the risk? This condition is more likely to develop in:  Men.  People who have a weak body defense system (immune system).  People who have diabetes.  People who use public showers, such as at a gym.  People who wear heavy-duty shoes, such as industrial or military shoes.  Seasons with warm, humid weather. What are the signs or symptoms? Symptoms of this condition include:  Itchy areas between your toes or on the soles of your feet.  White, flaky, or scaly areas between your toes or on the soles of your feet.  Very itchy small blisters between your toes or on the soles of your feet.  Small cuts in your skin. These cuts can become infected.  Thick or discolored toenails. How is this diagnosed? This condition may be diagnosed with a physical exam and a review of your medical history. Your health care provider may also take a skin or toenail sample to examine under a microscope. How is this treated? This condition is treated with antifungal medicines. These may be applied as powders, ointments, or creams. In severe cases, an oral antifungal medicine may be given. Follow these instructions at home: Medicines  Apply or take over-the-counter and prescription medicines only as told by your health  care provider.  Apply your antifungal medicine as told by your health care provider. Do not stop using the antifungal even if your condition improves. Foot care  Do not scratch your feet.  Keep your feet dry: ? Wear cotton or wool socks. Change your socks every day or if they become wet. ? Wear shoes that allow air to flow, such as sandals or canvas tennis shoes.  Wash and dry your feet, including the area between your toes. Also, wash and dry your feet: ? Every day or as told by your health care provider. ? After exercising. General instructions  Do not let others use towels, shoes, nail clippers, or other personal items that touch your feet.  Protect your feet by wearing sandals in wet areas, such as locker rooms and shared showers.  Keep all follow-up visits as told by your health care provider. This is important.  If you have diabetes, keep your blood sugar under control. Contact a health care provider if:  You have a fever.  You have swelling, soreness, warmth, or redness in your foot.  Your feet are not getting better with treatment.  Your symptoms get worse.  You have new symptoms. Summary  Athlete's foot (tinea pedis) is a fungal infection of the skin on your feet. It often occurs on skin that is between or underneath the toes.  This condition is caused by a fungus that grows in warm, moist places.  Symptoms include white, flaky, or scaly areas between   your toes or on the soles of your feet.  This condition is treated with antifungal medicines.  Keep your feet clean. Always dry them thoroughly. This information is not intended to replace advice given to you by your health care provider. Make sure you discuss any questions you have with your health care provider. Document Released: 10/14/2000 Document Revised: 08/07/2017 Document Reviewed: 08/07/2017 Elsevier Interactive Patient Education  2019 [employer]  Diabetes Mellitus and Foot Care Foot care is an  important part of your health, especially when you have diabetes. Diabetes may cause you to have problems because of poor blood flow (circulation) to your feet and legs, which can cause your skin to:  Become thinner and drier.  Break more easily.  Heal more slowly.  Peel and crack. You may also have nerve damage (neuropathy) in your legs and feet, causing decreased feeling in them. This means that you may not notice minor injuries to your feet that could lead to more serious problems. Noticing and addressing any potential problems early is the best way to prevent future foot problems. How to care for your feet Foot hygiene  Wash your feet daily with warm water and mild soap. Do not use hot water. Then, pat your feet and the areas between your toes until they are completely dry. Do not soak your feet as this can dry your skin.  Trim your toenails straight across. Do not dig under them or around the cuticle. File the edges of your nails with an emery board or nail file.  Apply a moisturizing lotion or petroleum jelly to the skin on your feet and to dry, brittle toenails. Use lotion that does not contain alcohol and is unscented. Do not apply lotion between your toes. Shoes and socks  Wear clean socks or stockings every day. Make sure they are not too tight. Do not wear knee-high stockings since they may decrease blood flow to your legs.  Wear shoes that fit properly and have enough cushioning. Always look in your shoes before you put them on to be sure there are no objects inside.  To break in new shoes, wear them for just a few hours a day. This prevents injuries on your feet. Wounds, scrapes, corns, and calluses  Check your feet daily for blisters, cuts, bruises, sores, and redness. If you cannot see the bottom of your feet, use a mirror or ask someone for help.  Do not cut corns or calluses or try to remove them with medicine.  If you find a minor scrape, cut, or break in the skin on  your feet, keep it and the skin around it clean and dry. You may clean these areas with mild soap and water. Do not clean the area with peroxide, alcohol, or iodine.  If you have a wound, scrape, corn, or callus on your foot, look at it several times a day to make sure it is healing and not infected. Check for: ? Redness, swelling, or pain. ? Fluid or blood. ? Warmth. ? Pus or a bad smell. General instructions  Do not cross your legs. This may decrease blood flow to your feet.  Do not use heating pads or hot water bottles on your feet. They may burn your skin. If you have lost feeling in your feet or legs, you may not know this is happening until it is too late.  Protect your feet from hot and cold by wearing shoes, such as at the beach or on hot pavement.    Schedule a complete foot exam at least once a year (annually) or more often if you have foot problems. If you have foot problems, report any cuts, sores, or bruises to your health care provider immediately. Contact a health care provider if:  You have a medical condition that increases your risk of infection and you have any cuts, sores, or bruises on your feet.  You have an injury that is not healing.  You have redness on your legs or feet.  You feel burning or tingling in your legs or feet.  You have pain or cramps in your legs and feet.  Your legs or feet are numb.  Your feet always feel cold.  You have pain around a toenail. Get help right away if:  You have a wound, scrape, corn, or callus on your foot and: ? You have pain, swelling, or redness that gets worse. ? You have fluid or blood coming from the wound, scrape, corn, or callus. ? Your wound, scrape, corn, or callus feels warm to the touch. ? You have pus or a bad smell coming from the wound, scrape, corn, or callus. ? You have a fever. ? You have a red line going up your leg. Summary  Check your feet every day for cuts, sores, red spots, swelling, and  blisters.  Moisturize feet and legs daily.  Wear shoes that fit properly and have enough cushioning.  If you have foot problems, report any cuts, sores, or bruises to your health care provider immediately.  Schedule a complete foot exam at least once a year (annually) or more often if you have foot problems. This information is not intended to replace advice given to you by your health care provider. Make sure you discuss any questions you have with your health care provider. Document Released: 10/14/2000 Document Revised: 11/29/2017 Document Reviewed: 11/18/2016 Elsevier Interactive Patient Education  2019 Elsevier Inc.  

## 2019-03-31 ENCOUNTER — Encounter: Payer: Self-pay | Admitting: Podiatry

## 2019-03-31 NOTE — Progress Notes (Signed)
Subjective:  [name] presents to clinic today with cc of  painful, thick, discolored, elongated toenails 1-5 b/l that become tender and cannot cut because of thickness. Pain is aggravated when wearing enclosed shoe gear.  Stephen Redwood, MD is his PCP.   Medications reviewed.  Allergies  Allergen Reactions  . Rosuvastatin   . Sulfonamide Derivatives Hives     Objective: Vitals:   03/29/19 1239  Temp: (!) 96.6 F (35.9 C)    Physical Examination:  Vascular Examination: Capillary refill time less than 3 seconds x 10 digits.  DP/PT pulses decreased b/l.  Digital hair absent b/l.  No edema noted b/l.  Skin temperature gradient WNL b/l.  Dermatological Examination: Skin with normal turgor, texture and tone b/l.  No open wounds b/l.  No interdigital macerations noted b/l.  Elongated, thick, discolored brittle toenails with subungual debris and pain on dorsal palpation of nailbeds 1-5 b/l.  Hyperkeratotic lesion distal tip right 2nd digit.  No erythema, no edema, no drainage, no flocculence noted.  Diffuse scaling noted peripherally and plantarly b/l feet with mild foot odor.  No interdigital macerations.  No blisters, no weeping. No signs of secondary bacterial infection noted.  Musculoskeletal Examination: Muscle strength 5/5 to all muscle groups b/l  No pain, crepitus or joint discomfort with active/passive ROM.  Neurological Examination: Sensation intact 5/5 b/l with 10 gram monofilament.  Vibratory sensation intact b/l.  Proprioceptive sensation intact b/l.  Assessment: Mycotic nail infection with pain 1-5 b/l Corn distal tip right 2nd digit Tinea pedis b/l NIDDM with PAD  Plan: 1. Toenails 1-5 b/l were debrided in length and girth without iatrogenic laceration. 2. Corn(s) pared distal tip right 2nd digit utilizing sterile scalpel blade without incident. 3. Prescription written for ciclopirox cream 0.77%.  Patient is to apply to both feet and  between toes twice daily for 4 weeks. 4. Continue soft, supportive shoe gear daily. 5.  Report any pedal injuries to medical professional. 6. Follow up 9 weeks. 7. Patient/POA to call should there be a question/concern in there interim.

## 2019-05-02 ENCOUNTER — Telehealth: Payer: Self-pay | Admitting: Cardiology

## 2019-05-02 NOTE — Telephone Encounter (Signed)
LVM, reminding pt of his appt with Dr Jordan on 05-06-19. °

## 2019-05-03 NOTE — Progress Notes (Signed)
[name] Date of Birth: September 25, 1935 Medical Record #[id number]  History of Present Illness: [name] is seen for follow up CAD. He presented with unstable angina in 2009 and cardiac catheterization demonstrated severe left main and three-vessel coronary disease. He underwent coronary bypass surgery by Dr. Roxan Hockey.   He is intolerant of statins due to myalgias. Also intolerant to Zetia.  In April 2016 he was seen by Dr Early for increased claudication. He underwent bilateral femoral endarterectomy and patch angioplasty and bilateral iliac stenting on 03/23/2015. Last dopplers in September 2019 were stable.   On follow up today he is seen with his son. He states he is doing very well. He denies any chest pain or SOB. He is primarily limited now by peripheral neuropathy which affects his balance. He now walks with a walker. Denies any claudication.  Current Outpatient Medications on File Prior to Visit  Medication Sig Dispense Refill  . amoxicillin (AMOXIL) 500 MG capsule TAKE 4 CAPSULES BY MOUTH 30 MINUTES PRIOR TO DENTAL WORK.  1  . aspirin 81 MG tablet Take 81 mg by mouth daily.      . ciclopirox (LOPROX) 0.77 % cream Apply to both feet and between toes bid x 4 weeks. 30 g 1  . clotrimazole-betamethasone (LOTRISONE) cream Apply 1 application topically 2 (two) times daily. 30 g 0  . Coenzyme Q10 (CO Q-10 PO) Take 300 mg by mouth 2 (two) times a week. Take on Mon and Fri.    . metFORMIN (GLUCOPHAGE) 1000 MG tablet Take 1 tablet by mouth Daily.     . metoprolol succinate (TOPROL-XL) 50 MG 24 hr tablet Take 50 mg by mouth daily.  2  . metoprolol tartrate (LOPRESSOR) 25 MG tablet Take 25 mg by mouth 2 (two) times daily.    . ramipril (ALTACE) 5 MG capsule Take 1 tablet by mouth Daily.      No current facility-administered medications on file prior to visit.     Allergies  Allergen Reactions  . Rosuvastatin   . Sulfonamide Derivatives Hives    Past Medical History:  Diagnosis Date   . Arthritis   . CAD (coronary artery disease)   . Cataract   . Diabetes mellitus   . GERD (gastroesophageal reflux disease)   . Helicobacter pylori gastritis 01/2006, 03/2011   EGD - Pylera Tx 2010  . Hiccups   . History of kidney stones   . Hyperlipidemia   . Hypertension   . PAD (peripheral artery disease) ([place])     Past Surgical History:  Procedure Laterality Date  . CATARACT EXTRACTION     right  . COLONOSCOPY  2002, 2007, 06/09/2011   Dr. Evelina Bucy - [name], [place]: for FHx colon cancer, no adenomas; 2012: [facility] - normal  . CORONARY ARTERY BYPASS GRAFT  05-07-2008  . ENDARTERECTOMY FEMORAL Bilateral 03/23/2015   Procedure: ENDARTERECTOMY OF BILATERAL COMMON FEMORAL AND PROFUNDA ARTERIES;  Surgeon: Rosetta Posner, MD;  Location: [facility];  Service: Vascular;  Laterality: Bilateral;  . ESOPHAGOGASTRODUODENOSCOPY  02/20/06   Dr. Evelina Bucy, [name], [place]. pylori gastritis and GERD changes (pathology)  . EYE SURGERY Left    cataract  . HERNIA REPAIR  12/24/2010  . INSERTION OF ILIAC STENT Bilateral 03/23/2015   Procedure: LEFT EXTERNAL ILIAC AND BILATERAL COMMON ILIAC STENT;  Surgeon: Rosetta Posner, MD;  Location: [facility];  Service: Vascular;  Laterality: Bilateral;  . JOINT REPLACEMENT     right knee replacement  . PATCH ANGIOPLASTY Bilateral 03/23/2015  Procedure: PATCH ANGIOPLASTY OF BILATERAL COMMON FEMORAL ARTERIES;  Surgeon: Rosetta Posner, MD;  Location: [facility];  Service: Vascular;  Laterality: Bilateral;  . PERIPHERAL VASCULAR CATHETERIZATION N/A 03/11/2015   Procedure: Abdominal Aortogram w/Lower Extremity;  Surgeon: Rosetta Posner, MD;  Location: [facility];  Service: Cardiovascular;  Laterality: N/A;  . TOTAL KNEE ARTHROPLASTY  07-13-2012   right  . UPPER GASTROINTESTINAL ENDOSCOPY  03/09/2009   Dr. Carlean Purl: H. pylori gastritis (Pylera Tx) and 2 cm hiatal hernia    Social History   Tobacco Use  Smoking Status Current Every Day Smoker  . Packs/day: 0.50  . Years: 40.00  .  Pack years: 20.00  . Types: Cigarettes  Smokeless Tobacco Never Used    Social History   Substance and Sexual Activity  Alcohol Use Yes  . Alcohol/week: 0.0 standard drinks   Comment: 1 time per month    Family History  Problem Relation Age of Onset  . Heart disease Father   . Diabetes Father   . Heart attack Father   . Hypertension Father   . Varicose Veins Father   . Cancer Sister        colon  . Diabetes Sister   . Other Sister        varicose veins  . Heart attack Sister   . Heart disease Sister   . Hyperlipidemia Sister   . Hypertension Sister   . Varicose Veins Sister   . Cancer Brother        prostate  . Diabetes Brother   . Heart disease Brother   . Hyperlipidemia Brother   . Hypertension Brother   . Other Brother        varicose veins  . Heart attack Brother   . Varicose Veins Brother   . Cancer Sister        lukemia  . Myasthenia gravis Sister   . Colon cancer Sister   . Leukemia Sister   . Heart attack Brother   . Crohn's disease Son     Review of Systems: The review of systems is positive chronic back problems and imbalance.  All other systems were reviewed and are negative.  Physical Exam: BP 132/65   Pulse (!) 52   Temp 97.9 F (36.6 C)   Wt 187 lb (84.8 kg)   BMI 24.67 kg/m  GENERAL:  Well appearing WM in NAD HEENT:  PERRL, EOMI, sclera are clear. Oropharynx is clear. NECK:  No jugular venous distention, carotid upstroke brisk and symmetric, no bruits, no thyromegaly or adenopathy LUNGS:  Clear to auscultation bilaterally CHEST:  Unremarkable HEART:  RRR,  PMI not displaced or sustained,S1 and S2 within normal limits, no S3, no S4: no clicks, no rubs, no murmurs ABD:  Soft, nontender. BS +, no masses or bruits. No hepatomegaly, no splenomegaly EXT:  1 + pulses throughout, no edema, no cyanosis no clubbing SKIN:  Warm and dry.  No rashes NEURO:  Alert and oriented x 3. Cranial nerves II through XII intact. PSYCH:  Cognitively  intact    LABORATORY DATA:  Lab Results  Component Value Date   WBC 10.2 03/24/2015   HGB 12.3 (L) 03/24/2015   HCT 36.0 (L) 03/24/2015   PLT 175 03/24/2015   GLUCOSE 122 (H) 03/24/2015   ALT 14 (L) 03/20/2015   AST 22 03/20/2015   NA 130 (L) 03/24/2015   K 4.3 03/24/2015   CL 97 (L) 03/24/2015   CREATININE 0.74 03/24/2015  BUN 11 03/24/2015   CO2 26 03/24/2015   TSH 1.156 02/16/2015   INR 1.13 03/20/2015   HGBA1C 7.3 (H) 03/20/2015   Labs dated 11/03/16: cholesterol 190, triglycerides 104, HDL 47, LDL 122. A1c 6.5%. CMET and TSH normal. Dated 11/08/17: Cholesterol 190, triglycerides 71, HDL 51, LDL 125. A1c 6.4%. Normal CBC, chemistries and TSH. Dated 11/14/18: cholesterol 197, triglycerides 73, HDL 52, LDL 130. A1c 6.1%. CMET, CBC, TSH normal  ECG today demonstrates sinus rhythm with a rate of 52 beats per minute with first-degree AV block. PACs. Rightward axis. I have personally reviewed and interpreted this study.    Assessment / Plan: 1. Coronary disease status post CABG in 2009. Currently asymptomatic.  Myoview study in 4/14 was low risk with a small fixed inferobasal defect. EF 64%. Continue aspirin, ACE inhibitor, and metoprolol.   2. Diabetes mellitus type 2. On metformin. Peripheral neuropathy. A1c 6.1%.   3. Hypertension, controlled.  4. Hyperlipidemia. Intolerant of statins and Zetia. Not interested in PCSK 9 inhibitor. Discussed adding Bemedoic acid and he wants to research this more.   5. Peripheral arterial disease s/p bilateral femoral endarterectomy and stenting of bilateral iliac arteries in May 2016.    6. AAA- small. 3.0 cm.  I will follow up in one year.

## 2019-05-06 ENCOUNTER — Other Ambulatory Visit: Payer: Self-pay

## 2019-05-06 ENCOUNTER — Ambulatory Visit (INDEPENDENT_AMBULATORY_CARE_PROVIDER_SITE_OTHER): Payer: Medicare Other | Admitting: Cardiology

## 2019-05-06 ENCOUNTER — Encounter: Payer: Self-pay | Admitting: Cardiology

## 2019-05-06 VITALS — BP 132/65 | HR 52 | Temp 97.9°F | Wt 187.0 lb

## 2019-05-06 DIAGNOSIS — E1159 Type 2 diabetes mellitus with other circulatory complications: Secondary | ICD-10-CM

## 2019-05-06 DIAGNOSIS — I70213 Atherosclerosis of native arteries of extremities with intermittent claudication, bilateral legs: Secondary | ICD-10-CM | POA: Diagnosis not present

## 2019-05-06 DIAGNOSIS — Z951 Presence of aortocoronary bypass graft: Secondary | ICD-10-CM | POA: Diagnosis not present

## 2019-05-06 DIAGNOSIS — I739 Peripheral vascular disease, unspecified: Secondary | ICD-10-CM

## 2019-05-06 DIAGNOSIS — I25810 Atherosclerosis of coronary artery bypass graft(s) without angina pectoris: Secondary | ICD-10-CM | POA: Diagnosis not present

## 2019-05-06 NOTE — Patient Instructions (Signed)
Consider Bempedoic acid for cholesterol therapy  Continue your current medication  followup in  One year

## 2019-05-08 DIAGNOSIS — K219 Gastro-esophageal reflux disease without esophagitis: Secondary | ICD-10-CM | POA: Diagnosis not present

## 2019-05-08 DIAGNOSIS — K5909 Other constipation: Secondary | ICD-10-CM | POA: Diagnosis not present

## 2019-05-08 DIAGNOSIS — M6281 Muscle weakness (generalized): Secondary | ICD-10-CM | POA: Diagnosis not present

## 2019-05-15 DIAGNOSIS — Z951 Presence of aortocoronary bypass graft: Secondary | ICD-10-CM | POA: Diagnosis not present

## 2019-05-15 DIAGNOSIS — E785 Hyperlipidemia, unspecified: Secondary | ICD-10-CM | POA: Diagnosis not present

## 2019-05-15 DIAGNOSIS — E1151 Type 2 diabetes mellitus with diabetic peripheral angiopathy without gangrene: Secondary | ICD-10-CM | POA: Diagnosis not present

## 2019-05-15 DIAGNOSIS — Z7982 Long term (current) use of aspirin: Secondary | ICD-10-CM | POA: Diagnosis not present

## 2019-05-15 DIAGNOSIS — Z96651 Presence of right artificial knee joint: Secondary | ICD-10-CM | POA: Diagnosis not present

## 2019-05-15 DIAGNOSIS — K5909 Other constipation: Secondary | ICD-10-CM | POA: Diagnosis not present

## 2019-05-15 DIAGNOSIS — Z7984 Long term (current) use of oral hypoglycemic drugs: Secondary | ICD-10-CM | POA: Diagnosis not present

## 2019-05-15 DIAGNOSIS — Z9181 History of falling: Secondary | ICD-10-CM | POA: Diagnosis not present

## 2019-05-15 DIAGNOSIS — I2510 Atherosclerotic heart disease of native coronary artery without angina pectoris: Secondary | ICD-10-CM | POA: Diagnosis not present

## 2019-05-15 DIAGNOSIS — M4800 Spinal stenosis, site unspecified: Secondary | ICD-10-CM | POA: Diagnosis not present

## 2019-05-15 DIAGNOSIS — M1712 Unilateral primary osteoarthritis, left knee: Secondary | ICD-10-CM | POA: Diagnosis not present

## 2019-05-15 DIAGNOSIS — K219 Gastro-esophageal reflux disease without esophagitis: Secondary | ICD-10-CM | POA: Diagnosis not present

## 2019-05-17 DIAGNOSIS — M1712 Unilateral primary osteoarthritis, left knee: Secondary | ICD-10-CM | POA: Diagnosis not present

## 2019-05-17 DIAGNOSIS — E1151 Type 2 diabetes mellitus with diabetic peripheral angiopathy without gangrene: Secondary | ICD-10-CM | POA: Diagnosis not present

## 2019-05-17 DIAGNOSIS — M4800 Spinal stenosis, site unspecified: Secondary | ICD-10-CM | POA: Diagnosis not present

## 2019-05-17 DIAGNOSIS — Z96651 Presence of right artificial knee joint: Secondary | ICD-10-CM | POA: Diagnosis not present

## 2019-05-17 DIAGNOSIS — I2510 Atherosclerotic heart disease of native coronary artery without angina pectoris: Secondary | ICD-10-CM | POA: Diagnosis not present

## 2019-05-17 DIAGNOSIS — K219 Gastro-esophageal reflux disease without esophagitis: Secondary | ICD-10-CM | POA: Diagnosis not present

## 2019-05-21 DIAGNOSIS — K219 Gastro-esophageal reflux disease without esophagitis: Secondary | ICD-10-CM | POA: Diagnosis not present

## 2019-05-21 DIAGNOSIS — M1712 Unilateral primary osteoarthritis, left knee: Secondary | ICD-10-CM | POA: Diagnosis not present

## 2019-05-21 DIAGNOSIS — M4800 Spinal stenosis, site unspecified: Secondary | ICD-10-CM | POA: Diagnosis not present

## 2019-05-21 DIAGNOSIS — Z96651 Presence of right artificial knee joint: Secondary | ICD-10-CM | POA: Diagnosis not present

## 2019-05-21 DIAGNOSIS — E1151 Type 2 diabetes mellitus with diabetic peripheral angiopathy without gangrene: Secondary | ICD-10-CM | POA: Diagnosis not present

## 2019-05-21 DIAGNOSIS — I2510 Atherosclerotic heart disease of native coronary artery without angina pectoris: Secondary | ICD-10-CM | POA: Diagnosis not present

## 2019-05-24 DIAGNOSIS — M4800 Spinal stenosis, site unspecified: Secondary | ICD-10-CM | POA: Diagnosis not present

## 2019-05-24 DIAGNOSIS — M1712 Unilateral primary osteoarthritis, left knee: Secondary | ICD-10-CM | POA: Diagnosis not present

## 2019-05-24 DIAGNOSIS — I2510 Atherosclerotic heart disease of native coronary artery without angina pectoris: Secondary | ICD-10-CM | POA: Diagnosis not present

## 2019-05-24 DIAGNOSIS — E1151 Type 2 diabetes mellitus with diabetic peripheral angiopathy without gangrene: Secondary | ICD-10-CM | POA: Diagnosis not present

## 2019-05-24 DIAGNOSIS — K219 Gastro-esophageal reflux disease without esophagitis: Secondary | ICD-10-CM | POA: Diagnosis not present

## 2019-05-24 DIAGNOSIS — Z96651 Presence of right artificial knee joint: Secondary | ICD-10-CM | POA: Diagnosis not present

## 2019-05-28 DIAGNOSIS — E1151 Type 2 diabetes mellitus with diabetic peripheral angiopathy without gangrene: Secondary | ICD-10-CM | POA: Diagnosis not present

## 2019-05-28 DIAGNOSIS — K219 Gastro-esophageal reflux disease without esophagitis: Secondary | ICD-10-CM | POA: Diagnosis not present

## 2019-05-28 DIAGNOSIS — Z96651 Presence of right artificial knee joint: Secondary | ICD-10-CM | POA: Diagnosis not present

## 2019-05-28 DIAGNOSIS — M1712 Unilateral primary osteoarthritis, left knee: Secondary | ICD-10-CM | POA: Diagnosis not present

## 2019-05-28 DIAGNOSIS — M4800 Spinal stenosis, site unspecified: Secondary | ICD-10-CM | POA: Diagnosis not present

## 2019-05-28 DIAGNOSIS — I2510 Atherosclerotic heart disease of native coronary artery without angina pectoris: Secondary | ICD-10-CM | POA: Diagnosis not present

## 2019-05-30 DIAGNOSIS — M4800 Spinal stenosis, site unspecified: Secondary | ICD-10-CM | POA: Diagnosis not present

## 2019-05-30 DIAGNOSIS — E1151 Type 2 diabetes mellitus with diabetic peripheral angiopathy without gangrene: Secondary | ICD-10-CM | POA: Diagnosis not present

## 2019-05-30 DIAGNOSIS — K219 Gastro-esophageal reflux disease without esophagitis: Secondary | ICD-10-CM | POA: Diagnosis not present

## 2019-05-30 DIAGNOSIS — Z96651 Presence of right artificial knee joint: Secondary | ICD-10-CM | POA: Diagnosis not present

## 2019-05-30 DIAGNOSIS — I2510 Atherosclerotic heart disease of native coronary artery without angina pectoris: Secondary | ICD-10-CM | POA: Diagnosis not present

## 2019-05-30 DIAGNOSIS — M1712 Unilateral primary osteoarthritis, left knee: Secondary | ICD-10-CM | POA: Diagnosis not present

## 2019-06-04 ENCOUNTER — Ambulatory Visit: Payer: Medicare Other | Admitting: Podiatry

## 2019-06-04 ENCOUNTER — Other Ambulatory Visit: Payer: Medicare Other | Admitting: Orthotics

## 2019-06-04 DIAGNOSIS — E1151 Type 2 diabetes mellitus with diabetic peripheral angiopathy without gangrene: Secondary | ICD-10-CM | POA: Diagnosis not present

## 2019-06-04 DIAGNOSIS — I2510 Atherosclerotic heart disease of native coronary artery without angina pectoris: Secondary | ICD-10-CM | POA: Diagnosis not present

## 2019-06-04 DIAGNOSIS — K219 Gastro-esophageal reflux disease without esophagitis: Secondary | ICD-10-CM | POA: Diagnosis not present

## 2019-06-04 DIAGNOSIS — Z96651 Presence of right artificial knee joint: Secondary | ICD-10-CM | POA: Diagnosis not present

## 2019-06-04 DIAGNOSIS — M1712 Unilateral primary osteoarthritis, left knee: Secondary | ICD-10-CM | POA: Diagnosis not present

## 2019-06-04 DIAGNOSIS — M4800 Spinal stenosis, site unspecified: Secondary | ICD-10-CM | POA: Diagnosis not present

## 2019-06-06 DIAGNOSIS — Z96651 Presence of right artificial knee joint: Secondary | ICD-10-CM | POA: Diagnosis not present

## 2019-06-06 DIAGNOSIS — M1712 Unilateral primary osteoarthritis, left knee: Secondary | ICD-10-CM | POA: Diagnosis not present

## 2019-06-06 DIAGNOSIS — K219 Gastro-esophageal reflux disease without esophagitis: Secondary | ICD-10-CM | POA: Diagnosis not present

## 2019-06-06 DIAGNOSIS — E1151 Type 2 diabetes mellitus with diabetic peripheral angiopathy without gangrene: Secondary | ICD-10-CM | POA: Diagnosis not present

## 2019-06-06 DIAGNOSIS — I2510 Atherosclerotic heart disease of native coronary artery without angina pectoris: Secondary | ICD-10-CM | POA: Diagnosis not present

## 2019-06-06 DIAGNOSIS — M4800 Spinal stenosis, site unspecified: Secondary | ICD-10-CM | POA: Diagnosis not present

## 2019-06-10 ENCOUNTER — Ambulatory Visit: Payer: Medicare Other | Admitting: Orthotics

## 2019-06-10 ENCOUNTER — Other Ambulatory Visit: Payer: Self-pay

## 2019-06-10 ENCOUNTER — Ambulatory Visit (INDEPENDENT_AMBULATORY_CARE_PROVIDER_SITE_OTHER): Payer: Medicare Other | Admitting: Podiatry

## 2019-06-10 ENCOUNTER — Encounter: Payer: Self-pay | Admitting: Podiatry

## 2019-06-10 VITALS — Temp 98.1°F

## 2019-06-10 DIAGNOSIS — B351 Tinea unguium: Secondary | ICD-10-CM

## 2019-06-10 DIAGNOSIS — L84 Corns and callosities: Secondary | ICD-10-CM

## 2019-06-10 DIAGNOSIS — M2042 Other hammer toe(s) (acquired), left foot: Secondary | ICD-10-CM

## 2019-06-10 DIAGNOSIS — E1159 Type 2 diabetes mellitus with other circulatory complications: Secondary | ICD-10-CM

## 2019-06-10 DIAGNOSIS — B353 Tinea pedis: Secondary | ICD-10-CM | POA: Diagnosis not present

## 2019-06-10 DIAGNOSIS — M79675 Pain in left toe(s): Secondary | ICD-10-CM

## 2019-06-10 DIAGNOSIS — M79674 Pain in right toe(s): Secondary | ICD-10-CM | POA: Diagnosis not present

## 2019-06-10 DIAGNOSIS — E1151 Type 2 diabetes mellitus with diabetic peripheral angiopathy without gangrene: Secondary | ICD-10-CM | POA: Diagnosis not present

## 2019-06-10 DIAGNOSIS — M2041 Other hammer toe(s) (acquired), right foot: Secondary | ICD-10-CM

## 2019-06-10 MED ORDER — CLOTRIMAZOLE-BETAMETHASONE 1-0.05 % EX CREA: TOPICAL_CREAM | CUTANEOUS | 0 refills | Status: DC

## 2019-06-10 NOTE — Patient Instructions (Addendum)
Diabetes Mellitus and Foot Care Foot care is an important part of your health, especially when you have diabetes. Diabetes may cause you to have problems because of poor blood flow (circulation) to your feet and legs, which can cause your skin to:  Become thinner and drier.  Break more easily.  Heal more slowly.  Peel and crack. You may also have nerve damage (neuropathy) in your legs and feet, causing decreased feeling in them. This means that you may not notice minor injuries to your feet that could lead to more serious problems. Noticing and addressing any potential problems early is the best way to prevent future foot problems. How to care for your feet Foot hygiene  Wash your feet daily with warm water and mild soap. Do not use hot water. Then, pat your feet and the areas between your toes until they are completely dry. Do not soak your feet as this can dry your skin.  Trim your toenails straight across. Do not dig under them or around the cuticle. File the edges of your nails with an emery board or nail file.  Apply a moisturizing lotion or petroleum jelly to the skin on your feet and to dry, brittle toenails. Use lotion that does not contain alcohol and is unscented. Do not apply lotion between your toes. Shoes and socks  Wear clean socks or stockings every day. Make sure they are not too tight. Do not wear knee-high stockings since they may decrease blood flow to your legs.  Wear shoes that fit properly and have enough cushioning. Always look in your shoes before you put them on to be sure there are no objects inside.  To break in new shoes, wear them for just a few hours a day. This prevents injuries on your feet. Wounds, scrapes, corns, and calluses  Check your feet daily for blisters, cuts, bruises, sores, and redness. If you cannot see the bottom of your feet, use a mirror or ask someone for help.  Do not cut corns or calluses or try to remove them with medicine.  If you  find a minor scrape, cut, or break in the skin on your feet, keep it and the skin around it clean and dry. You may clean these areas with mild soap and water. Do not clean the area with peroxide, alcohol, or iodine.  If you have a wound, scrape, corn, or callus on your foot, look at it several times a day to make sure it is healing and not infected. Check for: ? Redness, swelling, or pain. ? Fluid or blood. ? Warmth. ? Pus or a bad smell. General instructions  Do not cross your legs. This may decrease blood flow to your feet.  Do not use heating pads or hot water bottles on your feet. They may burn your skin. If you have lost feeling in your feet or legs, you may not know this is happening until it is too late.  Protect your feet from hot and cold by wearing shoes, such as at the beach or on hot pavement.  Schedule a complete foot exam at least once a year (annually) or more often if you have foot problems. If you have foot problems, report any cuts, sores, or bruises to your health care provider immediately. Contact a health care provider if:  You have a medical condition that increases your risk of infection and you have any cuts, sores, or bruises on your feet.  You have an injury that is not   healing.  You have redness on your legs or feet.  You feel burning or tingling in your legs or feet.  You have pain or cramps in your legs and feet.  Your legs or feet are numb.  Your feet always feel cold.  You have pain around a toenail. Get help right away if:  You have a wound, scrape, corn, or callus on your foot and: ? You have pain, swelling, or redness that gets worse. ? You have fluid or blood coming from the wound, scrape, corn, or callus. ? Your wound, scrape, corn, or callus feels warm to the touch. ? You have pus or a bad smell coming from the wound, scrape, corn, or callus. ? You have a fever. ? You have a red line going up your leg. Summary  Check your feet every day  for cuts, sores, red spots, swelling, and blisters.  Moisturize feet and legs daily.  Wear shoes that fit properly and have enough cushioning.  If you have foot problems, report any cuts, sores, or bruises to your health care provider immediately.  Schedule a complete foot exam at least once a year (annually) or more often if you have foot problems. This information is not intended to replace advice given to you by your health care provider. Make sure you discuss any questions you have with your health care provider. Document Released: 10/14/2000 Document Revised: 11/29/2017 Document Reviewed: 11/18/2016 Elsevier Patient Education  2020 [employer]  Corns and Calluses Corns are small areas of thickened skin that occur on the top, sides, or tip of a toe. They contain a cone-shaped core with a point that can press on a nerve below. This causes pain.  Calluses are areas of thickened skin that can occur anywhere on the body, including the hands, fingers, palms, soles of the feet, and heels. Calluses are usually larger than corns. What are the causes? Corns and calluses are caused by rubbing (friction) or pressure, such as from shoes that are too tight or do not fit properly. What increases the risk? Corns are more likely to develop in people who have misshapen toes (toe deformities), such as hammer toes. Calluses can occur with friction to any area of the skin. They are more likely to develop in people who:  Work with their hands.  Wear shoes that fit poorly, are too tight, or are high-heeled.  Have toe deformities. What are the signs or symptoms? Symptoms of a corn or callus include:  A hard growth on the skin.  Pain or tenderness under the skin.  Redness and swelling.  Increased discomfort while wearing tight-fitting shoes, if your feet are affected. If a corn or callus becomes infected, symptoms may include:  Redness and swelling that gets worse.  Pain.  Fluid, blood, or  pus draining from the corn or callus. How is this diagnosed? Corns and calluses may be diagnosed based on your symptoms, your medical history, and a physical exam. How is this treated? Treatment for corns and calluses may include:  Removing the cause of the friction or pressure. This may involve: ? Changing your shoes. ? Wearing shoe inserts (orthotics) or other protective layers in your shoes, such as a corn pad. ? Wearing gloves.  Applying medicine to the skin (topical medicine) to help soften skin in the hardened, thickened areas.  Removing layers of dead skin with a file to reduce the size of the corn or callus.  Removing the corn or callus with a scalpel or   laser.  Taking antibiotic medicines, if your corn or callus is infected.  Having surgery, if a toe deformity is the cause. Follow these instructions at home:   Take over-the-counter and prescription medicines only as told by your health care provider.  If you were prescribed an antibiotic, take it as told by your health care provider. Do not stop taking it even if your condition starts to improve.  Wear shoes that fit well. Avoid wearing high-heeled shoes and shoes that are too tight or too loose.  Wear any padding, protective layers, gloves, or orthotics as told by your health care provider.  Soak your hands or feet and then use a file or pumice stone to soften your corn or callus. Do this as told by your health care provider.  Check your corn or callus every day for symptoms of infection. Contact a health care provider if you:  Notice that your symptoms do not improve with treatment.  Have redness or swelling that gets worse.  Notice that your corn or callus becomes painful.  Have fluid, blood, or pus coming from your corn or callus.  Have new symptoms. Summary  Corns are small areas of thickened skin that occur on the top, sides, or tip of a toe.  Calluses are areas of thickened skin that can occur anywhere  on the body, including the hands, fingers, palms, and soles of the feet. Calluses are usually larger than corns.  Corns and calluses are caused by rubbing (friction) or pressure, such as from shoes that are too tight or do not fit properly.  Treatment may include wearing any padding, protective layers, gloves, or orthotics as told by your health care provider. This information is not intended to replace advice given to you by your health care provider. Make sure you discuss any questions you have with your health care provider. Document Released: 07/23/2004 Document Revised: 02/06/2019 Document Reviewed: 08/30/2017 Elsevier Patient Education  2020 [employer]  Onychomycosis/Fungal Toenails  WHAT IS IT? An infection that lies within the keratin of your nail plate that is caused by a fungus.  WHY ME? Fungal infections affect all ages, sexes, races, and creeds.  There may be many factors that predispose you to a fungal infection such as age, coexisting medical conditions such as diabetes, or an autoimmune disease; stress, medications, fatigue, genetics, etc.  Bottom line: fungus thrives in a warm, moist environment and your shoes offer such a location.  IS IT CONTAGIOUS? Theoretically, yes.  You do not want to share shoes, nail clippers or files with someone who has fungal toenails.  Walking around barefoot in the same room or sleeping in the same bed is unlikely to transfer the organism.  It is important to realize, however, that fungus can spread easily from one nail to the next on the same foot.  HOW DO WE TREAT THIS?  There are several ways to treat this condition.  Treatment may depend on many factors such as age, medications, pregnancy, liver and kidney conditions, etc.  It is best to ask your doctor which options are available to you.  1. No treatment.   Unlike many other medical concerns, you can live with this condition.  However for many people this can be a painful condition and may  lead to ingrown toenails or a bacterial infection.  It is recommended that you keep the nails cut short to help reduce the amount of fungal nail. 2. Topical treatment.  These range from herbal remedies to prescription   strength nail lacquers.  About 40-50% effective, topicals require twice daily application for approximately 9 to 12 months or until an entirely new nail has grown out.  The most effective topicals are medical grade medications available through physicians offices. 3. Oral antifungal medications.  With an 80-90% cure rate, the most common oral medication requires 3 to 4 months of therapy and stays in your system for a year as the new nail grows out.  Oral antifungal medications do require blood work to make sure it is a safe drug for you.  A liver function panel will be performed prior to starting the medication and after the first month of treatment.  It is important to have the blood work performed to avoid any harmful side effects.  In general, this medication safe but blood work is required. 4. Laser Therapy.  This treatment is performed by applying a specialized laser to the affected nail plate.  This therapy is noninvasive, fast, and non-painful.  It is not covered by insurance and is therefore, out of pocket.  The results have been very good with a 80-95% cure rate.  [facility] is the only practice in the area to offer this therapy. 5. Permanent Nail Avulsion.  Removing the entire nail so that a new nail will not grow back. 

## 2019-06-11 NOTE — Progress Notes (Signed)
Patient presents today for diabetic shoe measurement and foam casting.  Goals of diabetic shoes/inserts to offer protection from conditions secondary to DM2, offer relief from sheer forces that could lead to ulcerations, protect the foot, and offer greater stability. Patient is under supervision of DPM Physician managing patients DM2: Patient has following documented conditions to qualify for diabetic shoes/inserts: Patient measured with brannock device: 

## 2019-06-13 DIAGNOSIS — K219 Gastro-esophageal reflux disease without esophagitis: Secondary | ICD-10-CM | POA: Diagnosis not present

## 2019-06-13 DIAGNOSIS — M1712 Unilateral primary osteoarthritis, left knee: Secondary | ICD-10-CM | POA: Diagnosis not present

## 2019-06-13 DIAGNOSIS — Z96651 Presence of right artificial knee joint: Secondary | ICD-10-CM | POA: Diagnosis not present

## 2019-06-13 DIAGNOSIS — E1151 Type 2 diabetes mellitus with diabetic peripheral angiopathy without gangrene: Secondary | ICD-10-CM | POA: Diagnosis not present

## 2019-06-13 DIAGNOSIS — M4800 Spinal stenosis, site unspecified: Secondary | ICD-10-CM | POA: Diagnosis not present

## 2019-06-13 DIAGNOSIS — I2510 Atherosclerotic heart disease of native coronary artery without angina pectoris: Secondary | ICD-10-CM | POA: Diagnosis not present

## 2019-06-14 DIAGNOSIS — Z7982 Long term (current) use of aspirin: Secondary | ICD-10-CM | POA: Diagnosis not present

## 2019-06-14 DIAGNOSIS — I2510 Atherosclerotic heart disease of native coronary artery without angina pectoris: Secondary | ICD-10-CM | POA: Diagnosis not present

## 2019-06-14 DIAGNOSIS — Z9181 History of falling: Secondary | ICD-10-CM | POA: Diagnosis not present

## 2019-06-14 DIAGNOSIS — K5909 Other constipation: Secondary | ICD-10-CM | POA: Diagnosis not present

## 2019-06-14 DIAGNOSIS — M1712 Unilateral primary osteoarthritis, left knee: Secondary | ICD-10-CM | POA: Diagnosis not present

## 2019-06-14 DIAGNOSIS — K219 Gastro-esophageal reflux disease without esophagitis: Secondary | ICD-10-CM | POA: Diagnosis not present

## 2019-06-14 DIAGNOSIS — Z96651 Presence of right artificial knee joint: Secondary | ICD-10-CM | POA: Diagnosis not present

## 2019-06-14 DIAGNOSIS — M4800 Spinal stenosis, site unspecified: Secondary | ICD-10-CM | POA: Diagnosis not present

## 2019-06-14 DIAGNOSIS — E1151 Type 2 diabetes mellitus with diabetic peripheral angiopathy without gangrene: Secondary | ICD-10-CM | POA: Diagnosis not present

## 2019-06-14 DIAGNOSIS — Z951 Presence of aortocoronary bypass graft: Secondary | ICD-10-CM | POA: Diagnosis not present

## 2019-06-14 DIAGNOSIS — E785 Hyperlipidemia, unspecified: Secondary | ICD-10-CM | POA: Diagnosis not present

## 2019-06-14 DIAGNOSIS — Z7984 Long term (current) use of oral hypoglycemic drugs: Secondary | ICD-10-CM | POA: Diagnosis not present

## 2019-06-17 DIAGNOSIS — K219 Gastro-esophageal reflux disease without esophagitis: Secondary | ICD-10-CM | POA: Diagnosis not present

## 2019-06-17 DIAGNOSIS — I2510 Atherosclerotic heart disease of native coronary artery without angina pectoris: Secondary | ICD-10-CM | POA: Diagnosis not present

## 2019-06-17 DIAGNOSIS — Z96651 Presence of right artificial knee joint: Secondary | ICD-10-CM | POA: Diagnosis not present

## 2019-06-17 DIAGNOSIS — E1151 Type 2 diabetes mellitus with diabetic peripheral angiopathy without gangrene: Secondary | ICD-10-CM | POA: Diagnosis not present

## 2019-06-17 DIAGNOSIS — M4800 Spinal stenosis, site unspecified: Secondary | ICD-10-CM | POA: Diagnosis not present

## 2019-06-17 DIAGNOSIS — M1712 Unilateral primary osteoarthritis, left knee: Secondary | ICD-10-CM | POA: Diagnosis not present

## 2019-06-17 NOTE — Progress Notes (Signed)
Subjective: [name] is a 84 y.o. y.o. male who presents for preventative foot care today with h/o NIDDM and PAD.  Patient's wife is present during the visit and notes lesions plantar arch right foot and posteromedial heel left foot.  She states these are new. She denies any drainage from the area.  He is also here for management of painful, discolored, thick toenails and painful corn right 2nd digit which interfere with daily activities.   [name], MD is his PCP.    Allergies  Allergen Reactions  . Rosuvastatin   . Sulfonamide Derivatives Hives    Objective: Vitals:   06/10/19 1508  Temp: 98.1 F (36.7 C)    Vascular Examination: Capillary refill time <3 seconds x 10 digits.  Dorsalis pedis and Posterior tibial pulses decreased b/l.  No digital hair x 10 digits  Skin temperature warm to cool b/l  Dermatological Examination: Skin with normal turgor, texture and tone b/l.  Toenails 1-5 b/l discolored, thick, dystrophic with subungual debris and pain with palpation to nailbeds due to thickness of nails.  Yellow scaly skin eruptions noted right plantar arch and posteromedial left heel with underlying erythema consistent with tinea pedis. No underlying blisters, no weeping, no underlying wounds. No evidence of underlying abscess. No lymphagitis. No pain on palpation.  Hyperkeratotic lesion noted distal tip right 2nd digit. No erythema, no edema, no drainage, no flocculence.  Musculoskeletal: Muscle strength 5/5 to all LE muscle groups.  Hammertoe deformity right 2nd digit.  Neurological: Sensation intact b/l 5/5 with 10 gram monofilament.  Vibratory sensation intact b/l.  Assessment: 1. Painful onychomycosis toenails 1-5 b/l 2. Tinea pedis b/l 3. Corn right 2nd digiit 4. NIDDM with Peripheral arterial disease  Plan: 1. Discuss diabetic foot care principles. Literature dispensed. 2. Toenails 1-5 b/l were debrided in length and girth without iatrogenic  bleeding. 3. Hyperkeratotic lesion(s) right 2nd digit  pared with sterile chisel blade and gently smoothed with burr. 4. For tinea, discontinue Ciclopirox Cream. Start Lotrisone Cream bid x 4 weeks. 5. Patient to continue soft, supportive shoe gear daily. 6. Patient to report any pedal injuries to medical professional immediately. 7. Follow up 9 weeks. 8. Patient/POA to call should there be a concern in the interim.

## 2019-06-20 DIAGNOSIS — K219 Gastro-esophageal reflux disease without esophagitis: Secondary | ICD-10-CM | POA: Diagnosis not present

## 2019-06-20 DIAGNOSIS — I2510 Atherosclerotic heart disease of native coronary artery without angina pectoris: Secondary | ICD-10-CM | POA: Diagnosis not present

## 2019-06-20 DIAGNOSIS — M4800 Spinal stenosis, site unspecified: Secondary | ICD-10-CM | POA: Diagnosis not present

## 2019-06-20 DIAGNOSIS — E1151 Type 2 diabetes mellitus with diabetic peripheral angiopathy without gangrene: Secondary | ICD-10-CM | POA: Diagnosis not present

## 2019-06-20 DIAGNOSIS — M1712 Unilateral primary osteoarthritis, left knee: Secondary | ICD-10-CM | POA: Diagnosis not present

## 2019-06-20 DIAGNOSIS — Z96651 Presence of right artificial knee joint: Secondary | ICD-10-CM | POA: Diagnosis not present

## 2019-06-27 DIAGNOSIS — Z96651 Presence of right artificial knee joint: Secondary | ICD-10-CM | POA: Diagnosis not present

## 2019-06-27 DIAGNOSIS — M1712 Unilateral primary osteoarthritis, left knee: Secondary | ICD-10-CM | POA: Diagnosis not present

## 2019-06-27 DIAGNOSIS — E1151 Type 2 diabetes mellitus with diabetic peripheral angiopathy without gangrene: Secondary | ICD-10-CM | POA: Diagnosis not present

## 2019-06-27 DIAGNOSIS — K219 Gastro-esophageal reflux disease without esophagitis: Secondary | ICD-10-CM | POA: Diagnosis not present

## 2019-06-27 DIAGNOSIS — I2510 Atherosclerotic heart disease of native coronary artery without angina pectoris: Secondary | ICD-10-CM | POA: Diagnosis not present

## 2019-06-27 DIAGNOSIS — M4800 Spinal stenosis, site unspecified: Secondary | ICD-10-CM | POA: Diagnosis not present

## 2019-07-05 ENCOUNTER — Ambulatory Visit: Payer: Medicare Other | Admitting: Orthotics

## 2019-07-05 ENCOUNTER — Other Ambulatory Visit: Payer: Self-pay

## 2019-07-05 DIAGNOSIS — M2041 Other hammer toe(s) (acquired), right foot: Secondary | ICD-10-CM

## 2019-07-05 DIAGNOSIS — E1159 Type 2 diabetes mellitus with other circulatory complications: Secondary | ICD-10-CM

## 2019-07-05 DIAGNOSIS — M79675 Pain in left toe(s): Secondary | ICD-10-CM

## 2019-07-05 DIAGNOSIS — E1140 Type 2 diabetes mellitus with diabetic neuropathy, unspecified: Secondary | ICD-10-CM | POA: Diagnosis not present

## 2019-07-05 DIAGNOSIS — M2042 Other hammer toe(s) (acquired), left foot: Secondary | ICD-10-CM | POA: Diagnosis not present

## 2019-07-05 DIAGNOSIS — L84 Corns and callosities: Secondary | ICD-10-CM | POA: Diagnosis not present

## 2019-07-05 DIAGNOSIS — B351 Tinea unguium: Secondary | ICD-10-CM

## 2019-07-17 NOTE — Progress Notes (Signed)
Patient had appointment today for definitive and final diabetic shoe fitting and delivery.  Patient was seen by Betha, C.Ped, [facility].   The inserts fit well and accomplished full contact with the plantar surface of the foot bilateral; the shoes fit well and offered forefoot freedom, no noticible heel slippage, and good toe clearance w/ the insert in place.  Patient was advised to monitor of any skin irritation, breakdown.  Patient was satisfied with fit and function. 

## 2019-08-15 DIAGNOSIS — Z23 Encounter for immunization: Secondary | ICD-10-CM | POA: Diagnosis not present

## 2019-08-16 ENCOUNTER — Other Ambulatory Visit: Payer: Self-pay

## 2019-08-16 ENCOUNTER — Ambulatory Visit (INDEPENDENT_AMBULATORY_CARE_PROVIDER_SITE_OTHER): Payer: Medicare Other | Admitting: Podiatry

## 2019-08-16 ENCOUNTER — Ambulatory Visit (INDEPENDENT_AMBULATORY_CARE_PROVIDER_SITE_OTHER): Payer: Medicare Other

## 2019-08-16 ENCOUNTER — Encounter: Payer: Self-pay | Admitting: Podiatry

## 2019-08-16 DIAGNOSIS — B351 Tinea unguium: Secondary | ICD-10-CM | POA: Diagnosis not present

## 2019-08-16 DIAGNOSIS — M79675 Pain in left toe(s): Secondary | ICD-10-CM | POA: Diagnosis not present

## 2019-08-16 DIAGNOSIS — L97411 Non-pressure chronic ulcer of right heel and midfoot limited to breakdown of skin: Secondary | ICD-10-CM | POA: Diagnosis not present

## 2019-08-16 DIAGNOSIS — E11621 Type 2 diabetes mellitus with foot ulcer: Secondary | ICD-10-CM

## 2019-08-16 DIAGNOSIS — L89890 Pressure ulcer of other site, unstageable: Secondary | ICD-10-CM | POA: Diagnosis not present

## 2019-08-16 DIAGNOSIS — E1159 Type 2 diabetes mellitus with other circulatory complications: Secondary | ICD-10-CM

## 2019-08-16 DIAGNOSIS — B353 Tinea pedis: Secondary | ICD-10-CM

## 2019-08-16 DIAGNOSIS — M79674 Pain in right toe(s): Secondary | ICD-10-CM

## 2019-08-16 MED ORDER — MUPIROCIN 2 % EX OINT: TOPICAL_OINTMENT | CUTANEOUS | 1 refills | Status: AC

## 2019-08-16 NOTE — Patient Instructions (Addendum)
APPLY MUPIROCIN OINTMENT DAILY AND COVER WITH A BANDAID   Diabetes Mellitus and Foot Care Foot care is an important part of your health, especially when you have diabetes. Diabetes may cause you to have problems because of poor blood flow (circulation) to your feet and legs, which can cause your skin to:  Become thinner and drier.  Break more easily.  Heal more slowly.  Peel and crack. You may also have nerve damage (neuropathy) in your legs and feet, causing decreased feeling in them. This means that you may not notice minor injuries to your feet that could lead to more serious problems. Noticing and addressing any potential problems early is the best way to prevent future foot problems. How to care for your feet Foot hygiene  Wash your feet daily with warm water and mild soap. Do not use hot water. Then, pat your feet and the areas between your toes until they are completely dry. Do not soak your feet as this can dry your skin.  Trim your toenails straight across. Do not dig under them or around the cuticle. File the edges of your nails with an emery board or nail file.  Apply a moisturizing lotion or petroleum jelly to the skin on your feet and to dry, brittle toenails. Use lotion that does not contain alcohol and is unscented. Do not apply lotion between your toes. Shoes and socks  Wear clean socks or stockings every day. Make sure they are not too tight. Do not wear knee-high stockings since they may decrease blood flow to your legs.  Wear shoes that fit properly and have enough cushioning. Always look in your shoes before you put them on to be sure there are no objects inside.  To break in new shoes, wear them for just a few hours a day. This prevents injuries on your feet. Wounds, scrapes, corns, and calluses  Check your feet daily for blisters, cuts, bruises, sores, and redness. If you cannot see the bottom of your feet, use a mirror or ask someone for help.  Do not cut corns  or calluses or try to remove them with medicine.  If you find a minor scrape, cut, or break in the skin on your feet, keep it and the skin around it clean and dry. You may clean these areas with mild soap and water. Do not clean the area with peroxide, alcohol, or iodine.  If you have a wound, scrape, corn, or callus on your foot, look at it several times a day to make sure it is healing and not infected. Check for: ? Redness, swelling, or pain. ? Fluid or blood. ? Warmth. ? Pus or a bad smell. General instructions  Do not cross your legs. This may decrease blood flow to your feet.  Do not use heating pads or hot water bottles on your feet. They may burn your skin. If you have lost feeling in your feet or legs, you may not know this is happening until it is too late.  Protect your feet from hot and cold by wearing shoes, such as at the beach or on hot pavement.  Schedule a complete foot exam at least once a year (annually) or more often if you have foot problems. If you have foot problems, report any cuts, sores, or bruises to your health care provider immediately. Contact a health care provider if:  You have a medical condition that increases your risk of infection and you have any cuts, sores, or bruises  on your feet.  You have an injury that is not healing.  You have redness on your legs or feet.  You feel burning or tingling in your legs or feet.  You have pain or cramps in your legs and feet.  Your legs or feet are numb.  Your feet always feel cold.  You have pain around a toenail. Get help right away if:  You have a wound, scrape, corn, or callus on your foot and: ? You have pain, swelling, or redness that gets worse. ? You have fluid or blood coming from the wound, scrape, corn, or callus. ? Your wound, scrape, corn, or callus feels warm to the touch. ? You have pus or a bad smell coming from the wound, scrape, corn, or callus. ? You have a fever. ? You have a red  line going up your leg. Summary  Check your feet every day for cuts, sores, red spots, swelling, and blisters.  Moisturize feet and legs daily.  Wear shoes that fit properly and have enough cushioning.  If you have foot problems, report any cuts, sores, or bruises to your health care provider immediately.  Schedule a complete foot exam at least once a year (annually) or more often if you have foot problems. This information is not intended to replace advice given to you by your health care provider. Make sure you discuss any questions you have with your health care provider. Document Released: 10/14/2000 Document Revised: 11/29/2017 Document Reviewed: 11/18/2016 Elsevier Patient Education  2020 [employer].

## 2019-08-21 NOTE — Progress Notes (Signed)
Subjective: Patient presents today with diabetes and cc of painful, discolored, thick toenails which interfere with daily activities. Pain is aggravated when wearing enclosed shoe gear. Pain is getting progressively worse and relieved with periodic professional debridement.  His wife is present during the visit. She states he received his new diabetic shoes. She is concerned of wound development on top of his right foot. She feels the new shoes has caused this wound.  She denies any drainage from area. She denies any fever, chills, night sweats, nausea or vomiting. He has continued to wear the diabetic shoes.  Wife also states plaque on bottom of right foot has resolved with Lotrisone Cream.  [name], MD is his PCP.  Allergies  Allergen Reactions  . Rosuvastatin   . Sulfonamide Derivatives Hives    Objective:  Vascular Examination: Capillary refill time <3 seconds x 10 digits.  Dorsalis pedis pulses decreased b/l.  Posterior tibial pulses decreased b/l.  Digital hair absent x 10 digits.  Skin temperature gradient warm to cool b/l.  Dermatological Examination: Skin thin and atrophic b/l.  Toenails 1-5 b/l discolored, thick, dystrophic with subungual debris and pain with palpation to nailbeds due to thickness of nails.  Unstageable pressure ulceration located dorsal right 1st metatarsal head.  Measurements carried out today of 0.5 x 0.7 cm with overlying scab. Blanchable erythema, no edema, no warmth or abnormal coolness. No odor.   Yellow scaly plaque resolved right plantar arch.   Right medial heel with  scaling noted.  No blisters, no weeping. No signs of secondary bacterial infection noted.    Musculoskeletal: Muscle strength 5/5 to all LE muscle groups b/l.  He has a pair of leather extra depth shoes. It appears he is getting pressure at dorsal crease of shoe.   Neurological: Sensation intact 5/5 b/l with 10 gram monofilament.  Xray right foot reveals no gas in  tissues. No bone erosion.  Assessment: 1. Painful onychomycosis toenails 1-5 b/l 2. Pressure ulcer right 1st metatarsal, noninfected in diabetic patient 3.   Tinea pedis right heel 4.   NIDDM with PAD  Plan: 1. Toenails 1-5 b/l were debrided in length and girth without iatrogenic bleeding. 2. Ulcer was cleansed right 1st metatarsal. Triple antibiotic ointment and bandaid was applied. 3. Xray right foot taken/reviewed with patient/wife. 4. Discontinue diabetic shoes until they can be evaluated by Pedorthist for modification.  5. Surgical shoe was dispensed for right foot. 6. For tinea pedis left heel, apply Lotrisone Cream to area twice daily for 4 weeks. 7. Wife was given instructions on offloading and dressing change/aftercare and was instructed to call immediately if any signs or symptoms of infection arise.  8. Patient instructed to report to emergency department with worsening appearance of ulcer/toe/foot, increased pain, foul odor, increased redness, swelling, drainage, fever, chills, nightsweats, nausea, vomiting, increased blood sugar.  9. Patient/POA related understanding. 10. Follow up one week for ulcer recheck. Also schedule with Pedorthist in one week. 11. Patient/POA to call should there be a concern in the interim.

## 2019-08-26 ENCOUNTER — Ambulatory Visit: Payer: Medicare Other | Admitting: Podiatry

## 2019-08-26 ENCOUNTER — Other Ambulatory Visit: Payer: Medicare Other | Admitting: Orthotics

## 2019-10-03 DIAGNOSIS — E1151 Type 2 diabetes mellitus with diabetic peripheral angiopathy without gangrene: Secondary | ICD-10-CM | POA: Diagnosis not present

## 2019-10-03 DIAGNOSIS — R34 Anuria and oliguria: Secondary | ICD-10-CM | POA: Diagnosis not present

## 2019-10-03 DIAGNOSIS — M6281 Muscle weakness (generalized): Secondary | ICD-10-CM | POA: Diagnosis not present

## 2019-10-03 DIAGNOSIS — E871 Hypo-osmolality and hyponatremia: Secondary | ICD-10-CM | POA: Diagnosis not present

## 2019-10-08 DIAGNOSIS — E7849 Other hyperlipidemia: Secondary | ICD-10-CM | POA: Diagnosis not present

## 2019-11-11 DIAGNOSIS — E860 Dehydration: Secondary | ICD-10-CM | POA: Diagnosis not present

## 2019-11-11 DIAGNOSIS — I10 Essential (primary) hypertension: Secondary | ICD-10-CM | POA: Diagnosis not present

## 2019-11-11 DIAGNOSIS — M6281 Muscle weakness (generalized): Secondary | ICD-10-CM | POA: Diagnosis not present

## 2019-11-11 DIAGNOSIS — E871 Hypo-osmolality and hyponatremia: Secondary | ICD-10-CM | POA: Diagnosis not present

## 2019-11-11 DIAGNOSIS — K5909 Other constipation: Secondary | ICD-10-CM | POA: Diagnosis not present

## 2019-11-11 DIAGNOSIS — G250 Essential tremor: Secondary | ICD-10-CM | POA: Diagnosis not present

## 2019-11-11 DIAGNOSIS — N390 Urinary tract infection, site not specified: Secondary | ICD-10-CM | POA: Diagnosis not present

## 2019-11-14 DIAGNOSIS — G250 Essential tremor: Secondary | ICD-10-CM | POA: Diagnosis not present

## 2019-11-14 DIAGNOSIS — Z9181 History of falling: Secondary | ICD-10-CM | POA: Diagnosis not present

## 2019-11-14 DIAGNOSIS — Z7982 Long term (current) use of aspirin: Secondary | ICD-10-CM | POA: Diagnosis not present

## 2019-11-14 DIAGNOSIS — Z792 Long term (current) use of antibiotics: Secondary | ICD-10-CM | POA: Diagnosis not present

## 2019-11-14 DIAGNOSIS — E1151 Type 2 diabetes mellitus with diabetic peripheral angiopathy without gangrene: Secondary | ICD-10-CM | POA: Diagnosis not present

## 2019-11-14 DIAGNOSIS — G8929 Other chronic pain: Secondary | ICD-10-CM | POA: Diagnosis not present

## 2019-11-14 DIAGNOSIS — F17210 Nicotine dependence, cigarettes, uncomplicated: Secondary | ICD-10-CM | POA: Diagnosis not present

## 2019-11-14 DIAGNOSIS — R531 Weakness: Secondary | ICD-10-CM | POA: Diagnosis not present

## 2019-11-14 DIAGNOSIS — Z7984 Long term (current) use of oral hypoglycemic drugs: Secondary | ICD-10-CM | POA: Diagnosis not present

## 2019-11-14 DIAGNOSIS — I714 Abdominal aortic aneurysm, without rupture: Secondary | ICD-10-CM | POA: Diagnosis not present

## 2019-11-14 DIAGNOSIS — E860 Dehydration: Secondary | ICD-10-CM | POA: Diagnosis not present

## 2019-11-14 DIAGNOSIS — I10 Essential (primary) hypertension: Secondary | ICD-10-CM | POA: Diagnosis not present

## 2019-11-14 DIAGNOSIS — N390 Urinary tract infection, site not specified: Secondary | ICD-10-CM | POA: Diagnosis not present

## 2019-11-14 DIAGNOSIS — I2510 Atherosclerotic heart disease of native coronary artery without angina pectoris: Secondary | ICD-10-CM | POA: Diagnosis not present

## 2019-11-14 DIAGNOSIS — M170 Bilateral primary osteoarthritis of knee: Secondary | ICD-10-CM | POA: Diagnosis not present

## 2019-11-14 DIAGNOSIS — M4800 Spinal stenosis, site unspecified: Secondary | ICD-10-CM | POA: Diagnosis not present

## 2019-11-14 DIAGNOSIS — Z955 Presence of coronary angioplasty implant and graft: Secondary | ICD-10-CM | POA: Diagnosis not present

## 2019-11-19 DIAGNOSIS — M4800 Spinal stenosis, site unspecified: Secondary | ICD-10-CM | POA: Diagnosis not present

## 2019-11-19 DIAGNOSIS — R531 Weakness: Secondary | ICD-10-CM | POA: Diagnosis not present

## 2019-11-19 DIAGNOSIS — G250 Essential tremor: Secondary | ICD-10-CM | POA: Diagnosis not present

## 2019-11-19 DIAGNOSIS — N390 Urinary tract infection, site not specified: Secondary | ICD-10-CM | POA: Diagnosis not present

## 2019-11-19 DIAGNOSIS — G8929 Other chronic pain: Secondary | ICD-10-CM | POA: Diagnosis not present

## 2019-11-19 DIAGNOSIS — E860 Dehydration: Secondary | ICD-10-CM | POA: Diagnosis not present

## 2019-11-21 DIAGNOSIS — R531 Weakness: Secondary | ICD-10-CM | POA: Diagnosis not present

## 2019-11-21 DIAGNOSIS — G250 Essential tremor: Secondary | ICD-10-CM | POA: Diagnosis not present

## 2019-11-21 DIAGNOSIS — R82998 Other abnormal findings in urine: Secondary | ICD-10-CM | POA: Diagnosis not present

## 2019-11-21 DIAGNOSIS — E7849 Other hyperlipidemia: Secondary | ICD-10-CM | POA: Diagnosis not present

## 2019-11-21 DIAGNOSIS — I10 Essential (primary) hypertension: Secondary | ICD-10-CM | POA: Diagnosis not present

## 2019-11-21 DIAGNOSIS — M4800 Spinal stenosis, site unspecified: Secondary | ICD-10-CM | POA: Diagnosis not present

## 2019-11-21 DIAGNOSIS — G8929 Other chronic pain: Secondary | ICD-10-CM | POA: Diagnosis not present

## 2019-11-21 DIAGNOSIS — E1149 Type 2 diabetes mellitus with other diabetic neurological complication: Secondary | ICD-10-CM | POA: Diagnosis not present

## 2019-11-21 DIAGNOSIS — Z125 Encounter for screening for malignant neoplasm of prostate: Secondary | ICD-10-CM | POA: Diagnosis not present

## 2019-11-21 DIAGNOSIS — N390 Urinary tract infection, site not specified: Secondary | ICD-10-CM | POA: Diagnosis not present

## 2019-11-21 DIAGNOSIS — E860 Dehydration: Secondary | ICD-10-CM | POA: Diagnosis not present

## 2019-11-22 ENCOUNTER — Other Ambulatory Visit: Payer: Self-pay

## 2019-11-22 ENCOUNTER — Encounter: Payer: Self-pay | Admitting: Podiatry

## 2019-11-22 ENCOUNTER — Ambulatory Visit (INDEPENDENT_AMBULATORY_CARE_PROVIDER_SITE_OTHER): Payer: Medicare Other | Admitting: Podiatry

## 2019-11-22 DIAGNOSIS — M79674 Pain in right toe(s): Secondary | ICD-10-CM

## 2019-11-22 DIAGNOSIS — B351 Tinea unguium: Secondary | ICD-10-CM

## 2019-11-22 DIAGNOSIS — M79675 Pain in left toe(s): Secondary | ICD-10-CM

## 2019-11-22 DIAGNOSIS — E1151 Type 2 diabetes mellitus with diabetic peripheral angiopathy without gangrene: Secondary | ICD-10-CM | POA: Diagnosis not present

## 2019-11-22 NOTE — Patient Instructions (Signed)
Diabetes Mellitus and Foot Care Foot care is an important part of your health, especially when you have diabetes. Diabetes may cause you to have problems because of poor blood flow (circulation) to your feet and legs, which can cause your skin to:  Become thinner and drier.  Break more easily.  Heal more slowly.  Peel and crack. You may also have nerve damage (neuropathy) in your legs and feet, causing decreased feeling in them. This means that you may not notice minor injuries to your feet that could lead to more serious problems. Noticing and addressing any potential problems early is the best way to prevent future foot problems. How to care for your feet Foot hygiene  Wash your feet daily with warm water and mild soap. Do not use hot water. Then, pat your feet and the areas between your toes until they are completely dry. Do not soak your feet as this can dry your skin.  Trim your toenails straight across. Do not dig under them or around the cuticle. File the edges of your nails with an emery board or nail file.  Apply a moisturizing lotion or petroleum jelly to the skin on your feet and to dry, brittle toenails. Use lotion that does not contain alcohol and is unscented. Do not apply lotion between your toes. Shoes and socks  Wear clean socks or stockings every day. Make sure they are not too tight. Do not wear knee-high stockings since they may decrease blood flow to your legs.  Wear shoes that fit properly and have enough cushioning. Always look in your shoes before you put them on to be sure there are no objects inside.  To break in new shoes, wear them for just a few hours a day. This prevents injuries on your feet. Wounds, scrapes, corns, and calluses  Check your feet daily for blisters, cuts, bruises, sores, and redness. If you cannot see the bottom of your feet, use a mirror or ask someone for help.  Do not cut corns or calluses or try to remove them with medicine.  If you  find a minor scrape, cut, or break in the skin on your feet, keep it and the skin around it clean and dry. You may clean these areas with mild soap and water. Do not clean the area with peroxide, alcohol, or iodine.  If you have a wound, scrape, corn, or callus on your foot, look at it several times a day to make sure it is healing and not infected. Check for: ? Redness, swelling, or pain. ? Fluid or blood. ? Warmth. ? Pus or a bad smell. General instructions  Do not cross your legs. This may decrease blood flow to your feet.  Do not use heating pads or hot water bottles on your feet. They may burn your skin. If you have lost feeling in your feet or legs, you may not know this is happening until it is too late.  Protect your feet from hot and cold by wearing shoes, such as at the beach or on hot pavement.  Schedule a complete foot exam at least once a year (annually) or more often if you have foot problems. If you have foot problems, report any cuts, sores, or bruises to your health care provider immediately. Contact a health care provider if:  You have a medical condition that increases your risk of infection and you have any cuts, sores, or bruises on your feet.  You have an injury that is not   healing.  You have redness on your legs or feet.  You feel burning or tingling in your legs or feet.  You have pain or cramps in your legs and feet.  Your legs or feet are numb.  Your feet always feel cold.  You have pain around a toenail. Get help right away if:  You have a wound, scrape, corn, or callus on your foot and: ? You have pain, swelling, or redness that gets worse. ? You have fluid or blood coming from the wound, scrape, corn, or callus. ? Your wound, scrape, corn, or callus feels warm to the touch. ? You have pus or a bad smell coming from the wound, scrape, corn, or callus. ? You have a fever. ? You have a red line going up your leg. Summary  Check your feet every day  for cuts, sores, red spots, swelling, and blisters.  Moisturize feet and legs daily.  Wear shoes that fit properly and have enough cushioning.  If you have foot problems, report any cuts, sores, or bruises to your health care provider immediately.  Schedule a complete foot exam at least once a year (annually) or more often if you have foot problems. This information is not intended to replace advice given to you by your health care provider. Make sure you discuss any questions you have with your health care provider. Document Revised: 07/10/2019 Document Reviewed: 11/18/2016 Elsevier Patient Education  2020 Elsevier Inc.  

## 2019-11-26 DIAGNOSIS — N390 Urinary tract infection, site not specified: Secondary | ICD-10-CM | POA: Diagnosis not present

## 2019-11-26 DIAGNOSIS — E860 Dehydration: Secondary | ICD-10-CM | POA: Diagnosis not present

## 2019-11-26 DIAGNOSIS — M4800 Spinal stenosis, site unspecified: Secondary | ICD-10-CM | POA: Diagnosis not present

## 2019-11-26 DIAGNOSIS — R531 Weakness: Secondary | ICD-10-CM | POA: Diagnosis not present

## 2019-11-26 DIAGNOSIS — G250 Essential tremor: Secondary | ICD-10-CM | POA: Diagnosis not present

## 2019-11-26 DIAGNOSIS — G8929 Other chronic pain: Secondary | ICD-10-CM | POA: Diagnosis not present

## 2019-11-27 DIAGNOSIS — F0390 Unspecified dementia without behavioral disturbance: Secondary | ICD-10-CM | POA: Diagnosis not present

## 2019-11-27 DIAGNOSIS — I739 Peripheral vascular disease, unspecified: Secondary | ICD-10-CM | POA: Diagnosis not present

## 2019-11-27 DIAGNOSIS — I2510 Atherosclerotic heart disease of native coronary artery without angina pectoris: Secondary | ICD-10-CM | POA: Diagnosis not present

## 2019-11-27 DIAGNOSIS — I10 Essential (primary) hypertension: Secondary | ICD-10-CM | POA: Diagnosis not present

## 2019-11-27 DIAGNOSIS — Z1339 Encounter for screening examination for other mental health and behavioral disorders: Secondary | ICD-10-CM | POA: Diagnosis not present

## 2019-11-27 DIAGNOSIS — E785 Hyperlipidemia, unspecified: Secondary | ICD-10-CM | POA: Diagnosis not present

## 2019-11-27 DIAGNOSIS — E1151 Type 2 diabetes mellitus with diabetic peripheral angiopathy without gangrene: Secondary | ICD-10-CM | POA: Diagnosis not present

## 2019-11-27 DIAGNOSIS — Z0000 Encounter for general adult medical examination without abnormal findings: Secondary | ICD-10-CM | POA: Diagnosis not present

## 2019-11-27 DIAGNOSIS — F17200 Nicotine dependence, unspecified, uncomplicated: Secondary | ICD-10-CM | POA: Diagnosis not present

## 2019-11-27 DIAGNOSIS — I714 Abdominal aortic aneurysm, without rupture: Secondary | ICD-10-CM | POA: Diagnosis not present

## 2019-11-27 DIAGNOSIS — K5909 Other constipation: Secondary | ICD-10-CM | POA: Diagnosis not present

## 2019-11-27 DIAGNOSIS — Z1331 Encounter for screening for depression: Secondary | ICD-10-CM | POA: Diagnosis not present

## 2019-11-27 DIAGNOSIS — G629 Polyneuropathy, unspecified: Secondary | ICD-10-CM | POA: Diagnosis not present

## 2019-11-27 DIAGNOSIS — M6281 Muscle weakness (generalized): Secondary | ICD-10-CM | POA: Diagnosis not present

## 2019-11-28 ENCOUNTER — Ambulatory Visit: Payer: Medicare Other

## 2019-11-28 NOTE — Progress Notes (Signed)
Subjective: [name] presents today for follow up of preventative diabetic foot care with and painful mycotic nails b/l that are difficult to trim. Pain interferes with ambulation. Aggravating factors include wearing enclosed shoe gear. Pain is relieved with periodic professional debridement.   Allergies  Allergen Reactions  . Rosuvastatin   . Sulfonamide Derivatives Hives     Objective: There were no vitals filed for this visit.  Vascular Examination:  Capillary fill time to digits <3s b/l, faintly palpable DP pulses b/l, faintly palpable PT pulses b/l, pedal hair absent b/l and skin temperature gradient warm to cool b/l  Dermatological Examination: Pedal skin is thin shiny, atrophic bilaterally, no open wounds bilaterally, no interdigital macerations bilaterally and toenails 1-5 b/l elongated, dystrophic, thickened, crumbly with subungual debris  Musculoskeletal: Normal muscle strength 5/5 to all lower extremity muscle groups bilaterally, no gross bony deformities bilaterally and no pain crepitus or joint limitation noted with ROM b/l  Neurological: Protective sensation intact 5/5 intact bilaterally with 10g monofilament b/l  Assessment: 1. Pain due to onychomycosis of toenails of both feet   2. Type II diabetes mellitus with peripheral circulatory disorder (HCC)      Plan: -Continue diabetic foot care principles. Literature dispensed on today.  -Toenails 1-5 b/l were debrided in length and girth without iatrogenic bleeding. -Patient to continue soft, supportive shoe gear daily. -Patient to report any pedal injuries to medical professional immediately. -Patient/POA to call should there be question/concern in the interim.  Return in about 3 months (around 02/20/2020) for diabetic nail trim.

## 2019-11-29 DIAGNOSIS — M4800 Spinal stenosis, site unspecified: Secondary | ICD-10-CM | POA: Diagnosis not present

## 2019-11-29 DIAGNOSIS — R531 Weakness: Secondary | ICD-10-CM | POA: Diagnosis not present

## 2019-11-29 DIAGNOSIS — G250 Essential tremor: Secondary | ICD-10-CM | POA: Diagnosis not present

## 2019-11-29 DIAGNOSIS — N390 Urinary tract infection, site not specified: Secondary | ICD-10-CM | POA: Diagnosis not present

## 2019-11-29 DIAGNOSIS — G8929 Other chronic pain: Secondary | ICD-10-CM | POA: Diagnosis not present

## 2019-11-29 DIAGNOSIS — E860 Dehydration: Secondary | ICD-10-CM | POA: Diagnosis not present

## 2019-12-03 DIAGNOSIS — R531 Weakness: Secondary | ICD-10-CM | POA: Diagnosis not present

## 2019-12-03 DIAGNOSIS — G250 Essential tremor: Secondary | ICD-10-CM | POA: Diagnosis not present

## 2019-12-03 DIAGNOSIS — G8929 Other chronic pain: Secondary | ICD-10-CM | POA: Diagnosis not present

## 2019-12-03 DIAGNOSIS — N390 Urinary tract infection, site not specified: Secondary | ICD-10-CM | POA: Diagnosis not present

## 2019-12-03 DIAGNOSIS — M4800 Spinal stenosis, site unspecified: Secondary | ICD-10-CM | POA: Diagnosis not present

## 2019-12-03 DIAGNOSIS — E860 Dehydration: Secondary | ICD-10-CM | POA: Diagnosis not present

## 2019-12-06 DIAGNOSIS — G8929 Other chronic pain: Secondary | ICD-10-CM | POA: Diagnosis not present

## 2019-12-06 DIAGNOSIS — G250 Essential tremor: Secondary | ICD-10-CM | POA: Diagnosis not present

## 2019-12-06 DIAGNOSIS — E860 Dehydration: Secondary | ICD-10-CM | POA: Diagnosis not present

## 2019-12-06 DIAGNOSIS — M4800 Spinal stenosis, site unspecified: Secondary | ICD-10-CM | POA: Diagnosis not present

## 2019-12-06 DIAGNOSIS — N390 Urinary tract infection, site not specified: Secondary | ICD-10-CM | POA: Diagnosis not present

## 2019-12-06 DIAGNOSIS — R531 Weakness: Secondary | ICD-10-CM | POA: Diagnosis not present

## 2019-12-07 ENCOUNTER — Ambulatory Visit: Payer: Medicare Other | Attending: Internal Medicine

## 2019-12-07 DIAGNOSIS — Z23 Encounter for immunization: Secondary | ICD-10-CM

## 2019-12-07 NOTE — Progress Notes (Signed)
   Covid-19 Vaccination Clinic  Name:  [name]    MRN: [id number] DOB: 1935/04/29  12/07/2019  [name] was observed post Covid-19 immunization for 15 minutes without incidence. He was provided with Vaccine Information Sheet and instruction to access the V-Safe system.   [name] was instructed to call 911 with any severe reactions post vaccine: Marland Kitchen Difficulty breathing  . Swelling of your face and throat  . A fast heartbeat  . A bad rash all over your body  . Dizziness and weakness    Immunizations Administered    Name Date Dose VIS Date Route   Pfizer COVID-19 Vaccine 12/07/2019  9:18 AM 0.3 mL 10/11/2019 Intramuscular   Manufacturer: [place]   Lot: [id number]   [place]: [id number]

## 2019-12-10 DIAGNOSIS — G250 Essential tremor: Secondary | ICD-10-CM | POA: Diagnosis not present

## 2019-12-10 DIAGNOSIS — N390 Urinary tract infection, site not specified: Secondary | ICD-10-CM | POA: Diagnosis not present

## 2019-12-10 DIAGNOSIS — G8929 Other chronic pain: Secondary | ICD-10-CM | POA: Diagnosis not present

## 2019-12-10 DIAGNOSIS — E860 Dehydration: Secondary | ICD-10-CM | POA: Diagnosis not present

## 2019-12-10 DIAGNOSIS — R531 Weakness: Secondary | ICD-10-CM | POA: Diagnosis not present

## 2019-12-10 DIAGNOSIS — M4800 Spinal stenosis, site unspecified: Secondary | ICD-10-CM | POA: Diagnosis not present

## 2019-12-13 DIAGNOSIS — G250 Essential tremor: Secondary | ICD-10-CM | POA: Diagnosis not present

## 2019-12-13 DIAGNOSIS — R531 Weakness: Secondary | ICD-10-CM | POA: Diagnosis not present

## 2019-12-13 DIAGNOSIS — G8929 Other chronic pain: Secondary | ICD-10-CM | POA: Diagnosis not present

## 2019-12-13 DIAGNOSIS — E860 Dehydration: Secondary | ICD-10-CM | POA: Diagnosis not present

## 2019-12-13 DIAGNOSIS — M4800 Spinal stenosis, site unspecified: Secondary | ICD-10-CM | POA: Diagnosis not present

## 2019-12-13 DIAGNOSIS — N390 Urinary tract infection, site not specified: Secondary | ICD-10-CM | POA: Diagnosis not present

## 2019-12-14 DIAGNOSIS — R531 Weakness: Secondary | ICD-10-CM | POA: Diagnosis not present

## 2019-12-14 DIAGNOSIS — I10 Essential (primary) hypertension: Secondary | ICD-10-CM | POA: Diagnosis not present

## 2019-12-14 DIAGNOSIS — I714 Abdominal aortic aneurysm, without rupture: Secondary | ICD-10-CM | POA: Diagnosis not present

## 2019-12-14 DIAGNOSIS — E860 Dehydration: Secondary | ICD-10-CM | POA: Diagnosis not present

## 2019-12-14 DIAGNOSIS — I2510 Atherosclerotic heart disease of native coronary artery without angina pectoris: Secondary | ICD-10-CM | POA: Diagnosis not present

## 2019-12-14 DIAGNOSIS — G250 Essential tremor: Secondary | ICD-10-CM | POA: Diagnosis not present

## 2019-12-14 DIAGNOSIS — Z792 Long term (current) use of antibiotics: Secondary | ICD-10-CM | POA: Diagnosis not present

## 2019-12-14 DIAGNOSIS — N390 Urinary tract infection, site not specified: Secondary | ICD-10-CM | POA: Diagnosis not present

## 2019-12-14 DIAGNOSIS — Z9181 History of falling: Secondary | ICD-10-CM | POA: Diagnosis not present

## 2019-12-14 DIAGNOSIS — F17210 Nicotine dependence, cigarettes, uncomplicated: Secondary | ICD-10-CM | POA: Diagnosis not present

## 2019-12-14 DIAGNOSIS — E1151 Type 2 diabetes mellitus with diabetic peripheral angiopathy without gangrene: Secondary | ICD-10-CM | POA: Diagnosis not present

## 2019-12-14 DIAGNOSIS — Z7984 Long term (current) use of oral hypoglycemic drugs: Secondary | ICD-10-CM | POA: Diagnosis not present

## 2019-12-14 DIAGNOSIS — Z7982 Long term (current) use of aspirin: Secondary | ICD-10-CM | POA: Diagnosis not present

## 2019-12-14 DIAGNOSIS — Z955 Presence of coronary angioplasty implant and graft: Secondary | ICD-10-CM | POA: Diagnosis not present

## 2019-12-14 DIAGNOSIS — M4800 Spinal stenosis, site unspecified: Secondary | ICD-10-CM | POA: Diagnosis not present

## 2019-12-14 DIAGNOSIS — M170 Bilateral primary osteoarthritis of knee: Secondary | ICD-10-CM | POA: Diagnosis not present

## 2019-12-14 DIAGNOSIS — G8929 Other chronic pain: Secondary | ICD-10-CM | POA: Diagnosis not present

## 2019-12-19 ENCOUNTER — Ambulatory Visit: Payer: Medicare Other

## 2019-12-24 DIAGNOSIS — G250 Essential tremor: Secondary | ICD-10-CM | POA: Diagnosis not present

## 2019-12-24 DIAGNOSIS — E860 Dehydration: Secondary | ICD-10-CM | POA: Diagnosis not present

## 2019-12-24 DIAGNOSIS — M4800 Spinal stenosis, site unspecified: Secondary | ICD-10-CM | POA: Diagnosis not present

## 2019-12-24 DIAGNOSIS — G8929 Other chronic pain: Secondary | ICD-10-CM | POA: Diagnosis not present

## 2019-12-24 DIAGNOSIS — N390 Urinary tract infection, site not specified: Secondary | ICD-10-CM | POA: Diagnosis not present

## 2019-12-24 DIAGNOSIS — R531 Weakness: Secondary | ICD-10-CM | POA: Diagnosis not present

## 2019-12-31 ENCOUNTER — Ambulatory Visit: Payer: Medicare Other

## 2019-12-31 ENCOUNTER — Ambulatory Visit: Payer: Medicare Other | Attending: Internal Medicine

## 2019-12-31 DIAGNOSIS — Z23 Encounter for immunization: Secondary | ICD-10-CM | POA: Insufficient documentation

## 2019-12-31 NOTE — Progress Notes (Signed)
   Covid-19 Vaccination Clinic  Name:  [name]    MRN: [id number] DOB: 1935/03/06  12/31/2019  [name] was observed post Covid-19 immunization for 15 minutes without incident. He was provided with Vaccine Information Sheet and instruction to access the V-Safe system.   [name] was instructed to call 911 with any severe reactions post vaccine: Marland Kitchen Difficulty breathing  . Swelling of face and throat  . A fast heartbeat  . A bad rash all over body  . Dizziness and weakness   Immunizations Administered    Name Date Dose VIS Date Route   Pfizer COVID-19 Vaccine 12/31/2019  4:20 PM 0.3 mL 10/11/2019 Intramuscular   Manufacturer: [place]   Lot: [id number]   [place]: [id number]

## 2020-01-02 DIAGNOSIS — M4800 Spinal stenosis, site unspecified: Secondary | ICD-10-CM | POA: Diagnosis not present

## 2020-01-02 DIAGNOSIS — G250 Essential tremor: Secondary | ICD-10-CM | POA: Diagnosis not present

## 2020-01-02 DIAGNOSIS — R531 Weakness: Secondary | ICD-10-CM | POA: Diagnosis not present

## 2020-01-02 DIAGNOSIS — N390 Urinary tract infection, site not specified: Secondary | ICD-10-CM | POA: Diagnosis not present

## 2020-01-02 DIAGNOSIS — E860 Dehydration: Secondary | ICD-10-CM | POA: Diagnosis not present

## 2020-01-02 DIAGNOSIS — G8929 Other chronic pain: Secondary | ICD-10-CM | POA: Diagnosis not present

## 2020-01-07 DIAGNOSIS — M4800 Spinal stenosis, site unspecified: Secondary | ICD-10-CM | POA: Diagnosis not present

## 2020-01-07 DIAGNOSIS — G8929 Other chronic pain: Secondary | ICD-10-CM | POA: Diagnosis not present

## 2020-01-07 DIAGNOSIS — N390 Urinary tract infection, site not specified: Secondary | ICD-10-CM | POA: Diagnosis not present

## 2020-01-07 DIAGNOSIS — R531 Weakness: Secondary | ICD-10-CM | POA: Diagnosis not present

## 2020-01-07 DIAGNOSIS — G250 Essential tremor: Secondary | ICD-10-CM | POA: Diagnosis not present

## 2020-01-07 DIAGNOSIS — E860 Dehydration: Secondary | ICD-10-CM | POA: Diagnosis not present

## 2020-02-03 ENCOUNTER — Other Ambulatory Visit: Payer: Self-pay | Admitting: Internal Medicine

## 2020-02-03 DIAGNOSIS — R29810 Facial weakness: Secondary | ICD-10-CM | POA: Diagnosis not present

## 2020-02-03 DIAGNOSIS — N1831 Chronic kidney disease, stage 3a: Secondary | ICD-10-CM | POA: Diagnosis not present

## 2020-02-03 DIAGNOSIS — R471 Dysarthria and anarthria: Secondary | ICD-10-CM | POA: Diagnosis not present

## 2020-02-03 DIAGNOSIS — R200 Anesthesia of skin: Secondary | ICD-10-CM

## 2020-02-03 DIAGNOSIS — I129 Hypertensive chronic kidney disease with stage 1 through stage 4 chronic kidney disease, or unspecified chronic kidney disease: Secondary | ICD-10-CM | POA: Diagnosis not present

## 2020-02-03 DIAGNOSIS — R296 Repeated falls: Secondary | ICD-10-CM | POA: Diagnosis not present

## 2020-02-03 DIAGNOSIS — R202 Paresthesia of skin: Secondary | ICD-10-CM | POA: Diagnosis not present

## 2020-02-06 ENCOUNTER — Ambulatory Visit (HOSPITAL_COMMUNITY): Admission: RE | Admit: 2020-02-06 | Payer: Medicare Other | Source: Ambulatory Visit

## 2020-02-11 ENCOUNTER — Ambulatory Visit (HOSPITAL_COMMUNITY)
Admission: RE | Admit: 2020-02-11 | Discharge: 2020-02-11 | Disposition: A | Discharge status: Home / Self Care | Payer: Medicare Other | Source: Ambulatory Visit | Attending: Internal Medicine | Admitting: Internal Medicine

## 2020-02-11 ENCOUNTER — Other Ambulatory Visit: Payer: Self-pay

## 2020-02-11 DIAGNOSIS — R29810 Facial weakness: Secondary | ICD-10-CM | POA: Diagnosis not present

## 2020-02-11 DIAGNOSIS — R200 Anesthesia of skin: Secondary | ICD-10-CM | POA: Diagnosis not present

## 2020-02-11 MED ORDER — GADOBUTROL 1 MMOL/ML IV SOLN
8.0000 mL | Freq: Once | INTRAVENOUS | Status: AC | PRN
  Administered 2020-02-11: 8 mL via INTRAVENOUS

## 2020-02-18 ENCOUNTER — Other Ambulatory Visit: Payer: Self-pay

## 2020-02-18 ENCOUNTER — Encounter: Payer: Self-pay | Admitting: Neurology

## 2020-02-18 ENCOUNTER — Ambulatory Visit (INDEPENDENT_AMBULATORY_CARE_PROVIDER_SITE_OTHER): Payer: Medicare Other | Admitting: Neurology

## 2020-02-18 ENCOUNTER — Telehealth: Payer: Self-pay | Admitting: Neurology

## 2020-02-18 VITALS — BP 134/69 | HR 59 | Temp 97.6°F | Ht 73.0 in

## 2020-02-18 DIAGNOSIS — G301 Alzheimer's disease with late onset: Secondary | ICD-10-CM

## 2020-02-18 DIAGNOSIS — I6521 Occlusion and stenosis of right carotid artery: Secondary | ICD-10-CM | POA: Diagnosis not present

## 2020-02-18 DIAGNOSIS — R29810 Facial weakness: Secondary | ICD-10-CM | POA: Diagnosis not present

## 2020-02-18 DIAGNOSIS — G459 Transient cerebral ischemic attack, unspecified: Secondary | ICD-10-CM

## 2020-02-18 DIAGNOSIS — F0280 Dementia in other diseases classified elsewhere without behavioral disturbance: Secondary | ICD-10-CM

## 2020-02-18 MED ORDER — CLOPIDOGREL BISULFATE 75 MG PO TABS: 75.0000 mg | ORAL_TABLET | Freq: Every day | ORAL | 11 refills | Status: DC

## 2020-02-18 NOTE — Telephone Encounter (Signed)
Medicare/aarp order sent to [facility]. No auth they will reach out to the patient to schedule.  

## 2020-02-18 NOTE — Progress Notes (Signed)
[facility] [address] [place]. [place] [address] [phone]       OFFICE CONSULT NOTE  [name] Date of Birth:  [birth year] Medical Record Number:  [id number]   Referring MD: [name]. [name]  Reason for Referral: TIA  HPI: [name] is a 85 year old pleasant Caucasian male seen today for initial office consultation visit for TIA.  History is obtained from patient and his wife as well as review of referral notes and I personally reviewed available imaging films in PACS.  He has a past medical history of hypertension, hyperlipidemia, peripheral arterial disease, diabetes, coronary artery disease and arthritis who presented on 01/31/2020 with sudden onset of garbled speech and right facial droop noticed by his wife.  This lasted only about 3 to 5 minutes and resolved.  By the time EMS arrived he was already getting better and he refused to go to the ER.Marland Kitchen  He also complained of some numbness in the right hand but he is not quite sure to his wife insist on it.  He subsequently saw his primary care physician who ordered an MRI scan of the brain which was done on 02/12/2020 which shows old bilateral remote lacunar infarcts but no acute infarct.  There is subtle enhancement of the right 7th nerve but this is of unclear significance.  Patient denies any prior known history of strokes or TIAs.  He has been started on aspirin which is tolerating well without bruising or bleeding.  He states his sugars have all been under good control however I cannot find any recent hemoglobin A1c or lipid lipid profile in the computer.  He does have history of statin intolerance and has not been taking statins.  He has some mild memory loss and cognitive decline at baseline last Mini-Mental was 21/30 in January in Dr. Raul Clark office.  He was started on Aricept but could not tolerate it due to diarrhea and discontinued it.  He does have neuropathy and and some gait difficulty which is longstanding but does  use a walker and walks reasonably well without significant balance issues or frequent falls.  He had EKG done on 03/03/2020 in Dr. Raul Clark office which showed sinus arrhythmia with first-degree AV block.  No A. fib.  Lab work on 02/03/2020 showed elevated random glucose of 185 mg percent with CBC and chemistries were unremarkable except slightly low sodium of 132.  ROS:   14 system review of systems is positive for memory loss, numbness, normal speech, facial weakness, gait and balance difficulties all other systems negative  PMH:  Past Medical History:  Diagnosis Date  . Arthritis   . CAD (coronary artery disease)   . Cataract   . Diabetes mellitus   . GERD (gastroesophageal reflux disease)   . Helicobacter pylori gastritis 01/2006, 03/2011   EGD - Pylera Tx 2010  . Hiccups   . History of kidney stones   . Hyperlipidemia   . Hypertension   . PAD (peripheral artery disease) ([place])     Social History:  Social History   Socioeconomic History  . Marital status: Married    Spouse name: Not on file  . Number of children: 2  . Years of education: Not on file  . Highest education level: Not on file  Occupational History  . Occupation: Retired    Fish farm manager: RETIRED     Comment: sales  Tobacco Use  . Smoking status: Current Every Day Smoker    Packs/day: 0.50    Years:  40.00    Pack years: 20.00    Types: Cigarettes  . Smokeless tobacco: Never Used  Substance and Sexual Activity  . Alcohol use: Yes    Alcohol/week: 0.0 standard drinks    Comment: 1 time per month  . Drug use: No  . Sexual activity: Not on file  Other Topics Concern  . Not on file  Social History Narrative  . Not on file   Social Determinants of Health   Financial Resource Strain:   . Difficulty of Paying Living Expenses:   Food Insecurity:   . Worried About Charity fundraiser in the Last Year:   . Arboriculturist in the Last Year:   Transportation Needs:   . Film/video editor (Medical):   Marland Kitchen Lack of  Transportation (Non-Medical):   Physical Activity:   . Days of Exercise per Week:   . Minutes of Exercise per Session:   Stress:   . Feeling of Stress :   Social Connections:   . Frequency of Communication with Friends and Family:   . Frequency of Social Gatherings with Friends and Family:   . Attends Religious Services:   . Active Member of Clubs or Organizations:   . Attends Archivist Meetings:   Marland Kitchen Marital Status:   Intimate Partner Violence:   . Fear of Current or Ex-Partner:   . Emotionally Abused:   Marland Kitchen Physically Abused:   . Sexually Abused:     Medications:   Current Outpatient Medications on File Prior to Visit  Medication Sig Dispense Refill  . amoxicillin (AMOXIL) 500 MG capsule TAKE 4 CAPSULES BY MOUTH 30 MINUTES PRIOR TO DENTAL WORK.  1  . aspirin 81 MG tablet Take 81 mg by mouth daily.      . clotrimazole-betamethasone (LOTRISONE) cream Apply to both feet and between toes bid x 4 weeks. 45 g 0  . Coenzyme Q10 (CO Q-10 PO) Take 300 mg by mouth 2 (two) times a week. Take on Mon and Fri.    . metFORMIN (GLUCOPHAGE) 1000 MG tablet Take 1 tablet by mouth Daily.     . metoprolol succinate (TOPROL-XL) 50 MG 24 hr tablet Take 50 mg by mouth daily.  2  . pantoprazole (PROTONIX) 40 MG tablet Take 40 mg by mouth daily.    . ramipril (ALTACE) 5 MG capsule Take 1 tablet by mouth Daily.      No current facility-administered medications on file prior to visit.    Allergies:   Allergies  Allergen Reactions  . Rosuvastatin   . Sulfonamide Derivatives Hives    Physical Exam General: well developed, well nourished elderly Caucasian male, seated, in no evident distress Head: head normocephalic and atraumatic.   Neck: supple with no carotid or supraclavicular bruits Cardiovascular: regular rate and rhythm, no murmurs Musculoskeletal: no deformity Skin:  no rash/petichiae Vascular:  Normal pulses all extremities  Neurologic Exam Mental Status: Awake and fully  alert. Oriented to place and time. Recent and remote memory diminished attention span, concentration and fund of knowledge poor. Mood and affect appropriate.  Recall 0/3.  Able to name only 2 animals which can walk on 4 legs.  Clock drawing 2/4.  Unable to copy intersecting pentagons Cranial Nerves: Fundoscopic exam reveals sharp disc margins. Pupils equal, briskly reactive to light. Extraocular movements full without nystagmus. Visual fields full to confrontation. Hearing diminished bilaterally. Facial sensation intact. Face, tongue, palate moves normally and symmetrically.  Motor: Normal bulk and tone. Normal strength  in all tested extremity muscles in upper extremities but mild bilateral hip flexor weakness left greater than right and bilateral ankle dorsiflexor weakness left greater than right.  Mild left foot drop. Sensory.:  Diminished touch pinprick sensation in both lower extremities from ankle down as well as vibration. Coordination: Rapid alternating movements normal in all extremities. Finger-to-nose and heel-to-shin performed accurately bilaterally. Gait and Station: Not tested as patient is in wheelchair and did not bring his walker. Reflexes: 1+ and symmetric except both ankle jerks are absent.. Toes downgoing.   NIHSS  0 Modified Rankin 3   ASSESSMENT: 85 year old Caucasian male with a transient episode of facial droop and face and arm numbness likely due to left hemispheric subcortical TIA from small vessel disease.  MRI scan shows multiple silent lacunar infarcts.  Multiple vascular risk factors of hypertension hyperlipidemia diabetes and age.  Also longstanding cognitive impairment and memory loss likely due to mild Alzheimer's dementia.  He also has longstanding diabetic peripheral neuropathy and chronic gait difficulty     PLAN: I had a long d/w patient and his wifeabout his recent TIA and dementia,risk for recurrent stroke/TIAs, personally independently reviewed imaging studies  and stroke evaluation results and answered questions.recommend aspirin 81 mg and Plavix 75 mg daily for 2 more weeks and then discontinue aspirin and stay on Plavix alone for secondary stroke prevention and maintain strict control of hypertension with blood pressure goal below 130/90, diabetes with hemoglobin A1c goal below 6.5% and lipids with LDL cholesterol goal below 70 mg/dL. I also advised the patient to eat a healthy diet with plenty of whole grains, cereals, fruits and vegetables, exercise regularly and maintain ideal body weight.  Check MRI of the brain and neck, echocardiogram, lipid profile and hemoglobin A1c.  Also check memory panel labs and EEG.  Patient was counseled to quit smoking.  We also discussed treatment options for his dementia and memory loss but patient is quite sensitive and will not be able to tolerate cholinesterase inhibitors.  He may consider trial of Namenda or participation in [place] Alzheimer's dementia study.  There will be given information to review at home and decide.  Greater than 50% time during this 50-minute consultation visit was spent on counseling and coordination of care about his TIA and dementia and discussion about evaluation and treatment and answering questions followup in the future with me in 2 months or call earlier if necessary.  Antony Contras, MD  [facility] [address] [place], [place] [address]  Phone [phone] Fax [phone] Note: This document was prepared with digital dictation and possible smart phrase technology. Any transcriptional errors that result from this process are unintentional.

## 2020-02-18 NOTE — Patient Instructions (Addendum)
I had a long d/w patient and his wifeabout his recent TIA and dementia,risk for recurrent stroke/TIAs, personally independently reviewed imaging studies and stroke evaluation results and answered questions.recommend aspirin 81 mg and Plavix 75 mg daily for 2 more weeks and then discontinue aspirin and stay on Plavix alone for secondary stroke prevention and maintain strict control of hypertension with blood pressure goal below 130/90, diabetes with hemoglobin A1c goal below 6.5% and lipids with LDL cholesterol goal below 70 mg/dL. I also advised the patient to eat a healthy diet with plenty of whole grains, cereals, fruits and vegetables, exercise regularly and maintain ideal body weight.  Check MRI of the brain and neck, echocardiogram, lipid profile and hemoglobin A1c.  Also check memory panel labs and EEG.  Patient was counseled to quit smoking.  We also discussed treatment options for his dementia and memory loss but patient is quite sensitive and will not be able to tolerate cholinesterase inhibitors.  He may consider trial of Namenda or participation in [facility]'s dementia study.  There will be given information to review at home and decide.  Followup in the future with me in 2 months or call earlier if necessary.   Stroke Prevention Some medical conditions and behaviors are associated with a higher chance of having a stroke. You can help prevent a stroke by making nutrition, lifestyle, and other changes, including managing any medical conditions you may have. What nutrition changes can be made?   Eat healthy foods. You can do this by: ? Choosing foods high in fiber, such as fresh fruits and vegetables and whole grains. ? Eating at least 5 or more servings of fruits and vegetables a day. Try to fill half of your plate at each meal with fruits and vegetables. ? Choosing lean protein foods, such as lean cuts of meat, poultry without skin, fish, tofu, beans, and nuts. ? Eating low-fat  dairy products. ? Avoiding foods that are high in salt (sodium). This can help lower blood pressure. ? Avoiding foods that have saturated fat, trans fat, and cholesterol. This can help prevent high cholesterol. ? Avoiding processed and premade foods.  Follow your health care provider's specific guidelines for losing weight, controlling high blood pressure (hypertension), lowering high cholesterol, and managing diabetes. These may include: ? Reducing your daily calorie intake. ? Limiting your daily sodium intake to 1,500 milligrams (mg). ? Using only healthy fats for cooking, such as olive oil, canola oil, or sunflower oil. ? Counting your daily carbohydrate intake. What lifestyle changes can be made?  Maintain a healthy weight. Talk to your health care provider about your ideal weight.  Get at least 30 minutes of moderate physical activity at least 5 days a week. Moderate activity includes brisk walking, biking, and swimming.  Do not use any products that contain nicotine or tobacco, such as cigarettes and e-cigarettes. If you need help quitting, ask your health care provider. It may also be helpful to avoid exposure to secondhand smoke.  Limit alcohol intake to no more than 1 drink a day for nonpregnant women and 2 drinks a day for men. One drink equals 12 oz of beer, 5 oz of wine, or 1 oz of hard liquor.  Stop any illegal drug use.  Avoid taking birth control pills. Talk to your health care provider about the risks of taking birth control pills if: ? You are over 16 years old. ? You smoke. ? You get migraines. ? You have ever had a blood clot. What  other changes can be made?  Manage your cholesterol levels. ? Eating a healthy diet is important for preventing high cholesterol. If cholesterol cannot be managed through diet alone, you may also need to take medicines. ? Take any prescribed medicines to control your cholesterol as told by your health care provider.  Manage your  diabetes. ? Eating a healthy diet and exercising regularly are important parts of managing your blood sugar. If your blood sugar cannot be managed through diet and exercise, you may need to take medicines. ? Take any prescribed medicines to control your diabetes as told by your health care provider.  Control your hypertension. ? To reduce your risk of stroke, try to keep your blood pressure below 130/80. ? Eating a healthy diet and exercising regularly are an important part of controlling your blood pressure. If your blood pressure cannot be managed through diet and exercise, you may need to take medicines. ? Take any prescribed medicines to control hypertension as told by your health care provider. ? Ask your health care provider if you should monitor your blood pressure at home. ? Have your blood pressure checked every year, even if your blood pressure is normal. Blood pressure increases with age and some medical conditions.  Get evaluated for sleep disorders (sleep apnea). Talk to your health care provider about getting a sleep evaluation if you snore a lot or have excessive sleepiness.  Take over-the-counter and prescription medicines only as told by your health care provider. Aspirin or blood thinners (antiplatelets or anticoagulants) may be recommended to reduce your risk of forming blood clots that can lead to stroke.  Make sure that any other medical conditions you have, such as atrial fibrillation or atherosclerosis, are managed. What are the warning signs of a stroke? The warning signs of a stroke can be easily remembered as BEFAST.  B is for balance. Signs include: ? Dizziness. ? Loss of balance or coordination. ? Sudden trouble walking.  E is for eyes. Signs include: ? A sudden change in vision. ? Trouble seeing.  F is for face. Signs include: ? Sudden weakness or numbness of the face. ? The face or eyelid drooping to one side.  A is for arms. Signs include: ? Sudden  weakness or numbness of the arm, usually on one side of the body.  S is for speech. Signs include: ? Trouble speaking (aphasia). ? Trouble understanding.  T is for time. ? These symptoms may represent a serious problem that is an emergency. Do not wait to see if the symptoms will go away. Get medical help right away. Call your local emergency services (911 in the [place] Do not drive yourself to the hospital.  Other signs of stroke may include: ? A sudden, severe headache with no known cause. ? Nausea or vomiting. ? Seizure. Where to find more information For more information, visit:  American Stroke Association: www.strokeassociation.org  National Stroke Association: www.stroke.org Summary  You can prevent a stroke by eating healthy, exercising, not smoking, limiting alcohol intake, and managing any medical conditions you may have.  Do not use any products that contain nicotine or tobacco, such as cigarettes and e-cigarettes. If you need help quitting, ask your health care provider. It may also be helpful to avoid exposure to secondhand smoke.  Remember BEFAST for warning signs of stroke. Get help right away if you or a loved one has any of these signs. This information is not intended to replace advice given to you by your health  care provider. Make sure you discuss any questions you have with your health care provider. Document Revised: 09/29/2017 Document Reviewed: 11/22/2016 Elsevier Patient Education  2020 [employer].

## 2020-02-19 ENCOUNTER — Other Ambulatory Visit (HOSPITAL_COMMUNITY): Payer: Self-pay | Admitting: Neurology

## 2020-02-19 LAB — LIPID PANEL
Chol/HDL Ratio: 4 ratio (ref 0.0–5.0)
Cholesterol, Total: 170 mg/dL (ref 100–199)
HDL: 42 mg/dL (ref >39)
LDL Chol Calc (NIH): 107 mg/dL — ABNORMAL HIGH (ref 0–99)
Triglycerides: 114 mg/dL (ref 0–149)
VLDL Cholesterol Cal: 21 mg/dL (ref 5–40)

## 2020-02-19 LAB — DEMENTIA PANEL
Homocysteine: 17.5 umol/L (ref 0.0–21.3)
RPR Ser Ql: NONREACTIVE
TSH: 2.09 u[IU]/mL (ref 0.450–4.500)
Vitamin B-12: 453 pg/mL (ref 232–1245)

## 2020-02-19 LAB — HEMOGLOBIN A1C
Est. average glucose Bld gHb Est-mCnc: 148 mg/dL
Hgb A1c MFr Bld: 6.8 % — ABNORMAL HIGH (ref 4.8–5.6)

## 2020-02-19 MED ORDER — ATORVASTATIN CALCIUM 40 MG PO TABS: 40.0000 mg | ORAL_TABLET | Freq: Every day | ORAL | 11 refills | Status: DC

## 2020-02-19 NOTE — Progress Notes (Signed)
Kindly inform the patient that cholesterol profile is not satisfactory and he needs to start taking Lipitor 40 mg daily.  His control for sugar is also not satisfactory and he needs to see primary care physician to discuss treatment for this.  Available lab work for [facility] which are back so far are all normal

## 2020-02-24 ENCOUNTER — Telehealth: Payer: Self-pay | Admitting: Neurology

## 2020-02-24 NOTE — Telephone Encounter (Signed)
Lubke,Jean(wife, not found to be on DPR) has called to report that pt had a brain scan and possibly a neck scan just on 04-13th.  She states she is unsure if the insurance will cover this recent requested scan if it is similar to what pt just had on 04-13.  Wife informed RN will call and speak with pt once this is looked into.

## 2020-02-24 NOTE — Telephone Encounter (Signed)
Noted, informed them that it is a different exam than what the MRI is and the insurance should cover it.

## 2020-02-25 ENCOUNTER — Ambulatory Visit: Payer: Medicare Other | Admitting: Podiatry

## 2020-02-25 ENCOUNTER — Telehealth: Payer: Self-pay

## 2020-02-25 NOTE — Telephone Encounter (Signed)
I called pts wife about his labs. I stated that cholesterol profile is not satisfactory and he needs to start taking Lipitor 40 mg daily. His control for sugar is also not satisfactory and he needs to see primary care physician to discuss treatment for this. Available lab work for [facility] which are back so far are all normal.The wife stated pt cannot tolerate any oral statin medication.He tried crestor 4 years ago  and it cause him to have some memory issues and weakness in legs. Also the PCP tried to prescribed a statin but they decline.She stated they dont want to take the lipitor. I stated message will be sent to Dr. Leonie Man and wife verbalized understanding.

## 2020-02-25 NOTE — Telephone Encounter (Signed)
-----   Message from Garvin Fila, MD sent at 02/19/2020  4:13 PM EDT ----- [name] inform the patient that cholesterol profile is not satisfactory and he needs to start taking Lipitor 40 mg daily.  His control for sugar is also not satisfactory and he needs to see primary care physician to discuss treatment for this.  Available lab work for [facility] which are back so far are all normal

## 2020-02-26 ENCOUNTER — Other Ambulatory Visit: Payer: Self-pay

## 2020-02-26 ENCOUNTER — Encounter: Payer: Self-pay | Admitting: Podiatry

## 2020-02-26 ENCOUNTER — Ambulatory Visit (INDEPENDENT_AMBULATORY_CARE_PROVIDER_SITE_OTHER): Payer: Medicare Other | Admitting: Podiatry

## 2020-02-26 DIAGNOSIS — M79674 Pain in right toe(s): Secondary | ICD-10-CM | POA: Diagnosis not present

## 2020-02-26 DIAGNOSIS — B351 Tinea unguium: Secondary | ICD-10-CM

## 2020-02-26 DIAGNOSIS — M79675 Pain in left toe(s): Secondary | ICD-10-CM | POA: Diagnosis not present

## 2020-02-26 NOTE — Patient Instructions (Signed)
Diabetes Mellitus and Foot Care Foot care is an important part of your health, especially when you have diabetes. Diabetes may cause you to have problems because of poor blood flow (circulation) to your feet and legs, which can cause your skin to:  Become thinner and drier.  Break more easily.  Heal more slowly.  Peel and crack. You may also have nerve damage (neuropathy) in your legs and feet, causing decreased feeling in them. This means that you may not notice minor injuries to your feet that could lead to more serious problems. Noticing and addressing any potential problems early is the best way to prevent future foot problems. How to care for your feet Foot hygiene  Wash your feet daily with warm water and mild soap. Do not use hot water. Then, pat your feet and the areas between your toes until they are completely dry. Do not soak your feet as this can dry your skin.  Trim your toenails straight across. Do not dig under them or around the cuticle. File the edges of your nails with an emery board or nail file.  Apply a moisturizing lotion or petroleum jelly to the skin on your feet and to dry, brittle toenails. Use lotion that does not contain alcohol and is unscented. Do not apply lotion between your toes. Shoes and socks  Wear clean socks or stockings every day. Make sure they are not too tight. Do not wear knee-high stockings since they may decrease blood flow to your legs.  Wear shoes that fit properly and have enough cushioning. Always look in your shoes before you put them on to be sure there are no objects inside.  To break in new shoes, wear them for just a few hours a day. This prevents injuries on your feet. Wounds, scrapes, corns, and calluses  Check your feet daily for blisters, cuts, bruises, sores, and redness. If you cannot see the bottom of your feet, use a mirror or ask someone for help.  Do not cut corns or calluses or try to remove them with medicine.  If you  find a minor scrape, cut, or break in the skin on your feet, keep it and the skin around it clean and dry. You may clean these areas with mild soap and water. Do not clean the area with peroxide, alcohol, or iodine.  If you have a wound, scrape, corn, or callus on your foot, look at it several times a day to make sure it is healing and not infected. Check for: ? Redness, swelling, or pain. ? Fluid or blood. ? Warmth. ? Pus or a bad smell. General instructions  Do not cross your legs. This may decrease blood flow to your feet.  Do not use heating pads or hot water bottles on your feet. They may burn your skin. If you have lost feeling in your feet or legs, you may not know this is happening until it is too late.  Protect your feet from hot and cold by wearing shoes, such as at the beach or on hot pavement.  Schedule a complete foot exam at least once a year (annually) or more often if you have foot problems. If you have foot problems, report any cuts, sores, or bruises to your health care provider immediately. Contact a health care provider if:  You have a medical condition that increases your risk of infection and you have any cuts, sores, or bruises on your feet.  You have an injury that is not   healing.  You have redness on your legs or feet.  You feel burning or tingling in your legs or feet.  You have pain or cramps in your legs and feet.  Your legs or feet are numb.  Your feet always feel cold.  You have pain around a toenail. Get help right away if:  You have a wound, scrape, corn, or callus on your foot and: ? You have pain, swelling, or redness that gets worse. ? You have fluid or blood coming from the wound, scrape, corn, or callus. ? Your wound, scrape, corn, or callus feels warm to the touch. ? You have pus or a bad smell coming from the wound, scrape, corn, or callus. ? You have a fever. ? You have a red line going up your leg. Summary  Check your feet every day  for cuts, sores, red spots, swelling, and blisters.  Moisturize feet and legs daily.  Wear shoes that fit properly and have enough cushioning.  If you have foot problems, report any cuts, sores, or bruises to your health care provider immediately.  Schedule a complete foot exam at least once a year (annually) or more often if you have foot problems. This information is not intended to replace advice given to you by your health care provider. Make sure you discuss any questions you have with your health care provider. Document Revised: 07/10/2019 Document Reviewed: 11/18/2016 Elsevier Patient Education  2020 [employer]  Peripheral Vascular Disease Peripheral vascular disease (PVD) is a disease of the blood vessels. A simple term for PVD is poor circulation. In most cases, PVD narrows the blood vessels that carry blood from your heart to the rest of your body. This can result in a decreased supply of blood to your arms, legs, and internal organs, like your stomach or kidneys. However, it most often affects a person's lower legs and feet. There are two types of PVD.  Organic PVD. This is the more common type. It is caused by damage to the structure of blood vessels.  Functional PVD. This is caused by conditions that make blood vessels contract and tighten (spasm). Without treatment, PVD tends to get worse over time. PVD can also lead to acute limb ischemia. This is when an arm or leg suddenly has trouble getting enough blood. This is a medical emergency. What are the causes?  Each type of PVD has many different causes. The most common cause of PVD is buildup of a fatty material (plaque) inside your arteries (atherosclerosis). Small amounts of plaque can break off from the walls of the blood vessels and become lodged in a smaller artery. This blocks blood flow and can cause acute limb ischemia. Other common causes of PVD include:  Blood clots that form inside of blood vessels.  Injuries  to blood vessels.  Diseases that cause inflammation of blood vessels or cause blood vessel spasms.  Health behaviors and health history that increase your risk of developing PVD. What increases the risk? You are more likely to develop this condition if:  You have a family history of PVD.  You have certain medical conditions, including: ? High cholesterol. ? Diabetes. ? High blood pressure (hypertension). ? Coronary heart disease. ? Past problems with blood clots. ? Past injury, such as burns or a broken bone. These may have damaged blood vessels in your limbs. ? Buerger disease. This is caused by inflamed blood vessels in your hands and feet. ? Some forms of arthritis. ? Rare birth defects   that affect the arteries in your legs. ? Kidney disease.  You use tobacco or smoke.  You do not get enough exercise.  You are obese.  You are age 50 or older. What are the signs or symptoms? This condition may cause different symptoms. Your symptoms depend on what part of your body is not getting enough blood. Some common signs and symptoms include:  Cramps in your lower legs. This may be a symptom of poor leg circulation (claudication).  Pain and weakness in your legs. This happens while you are physically active but goes away when you rest (intermittent claudication).  Leg pain when at rest.  Leg numbness, tingling, or weakness.  Coldness in a leg or foot, especially when compared with the other leg.  Skin or hair changes. These can include: ? Hair loss. ? Shiny skin. ? Pale or bluish skin. ? Thick toenails.  Inability to get or maintain an erection (erectile dysfunction).  Fatigue. People with PVD are more likely to develop ulcers and sores on their toes, feet, or legs. These may take longer than normal to heal. How is this diagnosed? This condition is diagnosed based on:  Your signs and symptoms.  A physical exam and your medical history.  Other tests to find out what  is causing your PVD and to determine its severity. Tests may include: ? Blood pressure recordings from your arms and legs and measurements of the strength of your pulses (pulse volume recordings). ? Imaging studies using sound waves to take pictures of the blood flow through your blood vessels (Doppler ultrasound). ? Injecting a dye into your blood vessels before having imaging studies using:  X-rays (angiogram or arteriogram).  Computer-generated X-rays (CT angiogram).  A powerful electromagnetic field and a computer (magnetic resonance angiogram or MRA). How is this treated? Treatment for PVD depends on the cause of your condition and how severe your symptoms are. It also depends on your age. Underlying causes need to be treated and controlled. These include long-term (chronic) conditions, such as diabetes, high cholesterol, and high blood pressure. Treatment includes:  Lifestyle changes, such as: ? Quitting smoking. ? Exercising regularly. ? Following a low-fat, low-cholesterol diet.  Taking medicines, such as: ? Blood thinners to prevent blood clots. ? Medicines to improve blood flow. ? Medicines to improve your blood cholesterol levels.  Surgical procedures, such as: ? A procedure that uses an inflated balloon to open a blocked artery and improve blood flow (angioplasty). ? A procedure to put in a wire mesh tube to keep a blocked artery open (stent implant). ? Surgery to reroute blood flow around a blocked artery (peripheral bypass surgery). ? Surgery to remove dead tissue from an infected wound on the affected limb. ? Amputation. This is surgical removal of the affected limb. It may be necessary in cases of acute limb ischemia where there has been no improvement through medical or surgical treatments. Follow these instructions at home: Lifestyle  Do not use any products that contain nicotine or tobacco, such as cigarettes and e-cigarettes. If you need help quitting, ask your  health care provider.  Lose weight if you are overweight, and maintain a healthy weight as discussed by your health care provider.  Eat a diet that is low in fat and cholesterol. If you need help, ask your health care provider.  Exercise regularly. Ask your health care provider to suggest some good activities for you. General instructions  Take over-the-counter and prescription medicines only as told by your health   care provider.  Take good care of your feet: ? Wear comfortable shoes that fit well. ? Check your feet often for any cuts or sores.  Keep all follow-up visits as told by your health care provider. This is important. Contact a health care provider if:  You have cramps in your legs while walking.  You have leg pain when you are at rest.  You have coldness in a leg or foot.  Your skin changes.  You have erectile dysfunction.  You have cuts or sores on your feet that are not healing. Get help right away if:  Your arm or leg turns cold, numb, and blue.  Your arms or legs become red, warm, swollen, painful, or numb.  You have chest pain or trouble breathing.  You suddenly have weakness in your face, arm, or leg.  You become very confused or lose the ability to speak.  You suddenly have a very bad headache or lose your vision. Summary  Peripheral vascular disease (PVD) is a disease of the blood vessels.  In most cases, PVD narrows the blood vessels that carry blood from your heart to the rest of your body.  PVD may cause different symptoms. Your symptoms depend on what part of your body is not getting enough blood.  Treatment for PVD depends on the cause of your condition and how severe your symptoms are. This information is not intended to replace advice given to you by your health care provider. Make sure you discuss any questions you have with your health care provider. Document Revised: 09/29/2017 Document Reviewed: 11/24/2016 Elsevier Patient Education   2020 [employer] Inc.  

## 2020-03-02 NOTE — Progress Notes (Signed)
Subjective: [name] presents today for follow up of at risk foot care. Pt has h/o NIDDM with PAD and painful mycotic nails b/l that are difficult to trim. Pain interferes with ambulation. Aggravating factors include wearing enclosed shoe gear. Pain is relieved with periodic professional debridement.   Allergies  Allergen Reactions  . Rosuvastatin   . Sulfonamide Derivatives Hives     Objective: There were no vitals filed for this visit.  Pt 85 y.o. year old male  in NAD. AAO x 3.   Vascular Examination:  Capillary fill time to digits <3 seconds b/l. Palpable DP pulses b/l. Faintly palpable PT pulses b/l. Pedal hair absent b/l Skin temperature gradient warm to cool b/l.  Dermatological Examination: Pedal skin is thin shiny, atrophic bilaterally. No open wounds bilaterally. No interdigital macerations bilaterally. Toenails 1-5 b/l elongated, dystrophic, thickened, crumbly with subungual debris and tenderness to dorsal palpation.  Musculoskeletal: Normal muscle strength 5/5 to all lower extremity muscle groups bilaterally. No gross bony deformities bilaterally. No pain crepitus or joint limitation noted with ROM b/l.  Neurological: Protective sensation intact 5/5 intact bilaterally with 10g monofilament b/l. Vibratory sensation intact b/l.  Assessment: 1. Pain due to onychomycosis of toenails of both feet    Plan: -No new findings. No new orders. -Continue diabetic foot care principles. Literature dispensed on today.  -Toenails 1-5 b/l were debrided in length and girth with sterile nail nippers and dremel without iatrogenic bleeding.  -Patient to continue soft, supportive shoe gear daily. -Patient to report any pedal injuries to medical professional immediately. -Patient/POA to call should there be question/concern in the interim.  Return in about 3 months (around 05/27/2020) for diabetic nail trim.  Marzetta Board, DPM

## 2020-03-02 NOTE — Telephone Encounter (Signed)
I called the patient's listed number and spoke to his wife.  She stated that patient had such adverse reaction to Crestor 4 years ago that they do not want to try any statins.  I discussed the possibility of starting him on PCSK9 inhibitor injections but they declined that as well at the current time.

## 2020-03-03 ENCOUNTER — Ambulatory Visit (HOSPITAL_COMMUNITY): Payer: Medicare Other | Attending: Cardiology

## 2020-03-03 ENCOUNTER — Other Ambulatory Visit: Payer: Self-pay

## 2020-03-03 DIAGNOSIS — G459 Transient cerebral ischemic attack, unspecified: Secondary | ICD-10-CM | POA: Insufficient documentation

## 2020-03-06 NOTE — Progress Notes (Signed)
Kindly inform the patient that echocardiogram study shows normal pumping function of the heart.  No worrisome finding

## 2020-03-11 ENCOUNTER — Encounter: Payer: Self-pay | Admitting: *Deleted

## 2020-03-16 ENCOUNTER — Other Ambulatory Visit: Payer: Self-pay

## 2020-03-16 ENCOUNTER — Ambulatory Visit (INDEPENDENT_AMBULATORY_CARE_PROVIDER_SITE_OTHER): Payer: Medicare Other | Admitting: Neurology

## 2020-03-16 DIAGNOSIS — R410 Disorientation, unspecified: Secondary | ICD-10-CM

## 2020-03-16 DIAGNOSIS — G459 Transient cerebral ischemic attack, unspecified: Secondary | ICD-10-CM

## 2020-03-23 ENCOUNTER — Ambulatory Visit
Admission: RE | Admit: 2020-03-23 | Discharge: 2020-03-23 | Disposition: A | Discharge status: Home / Self Care | Payer: Medicare Other | Source: Ambulatory Visit | Attending: Neurology | Admitting: Neurology

## 2020-03-23 ENCOUNTER — Other Ambulatory Visit: Payer: Self-pay

## 2020-03-23 DIAGNOSIS — G459 Transient cerebral ischemic attack, unspecified: Secondary | ICD-10-CM | POA: Diagnosis not present

## 2020-03-23 MED ORDER — GADOBENATE DIMEGLUMINE 529 MG/ML IV SOLN
17.0000 mL | Freq: Once | INTRAVENOUS | Status: AC | PRN
  Administered 2020-03-23: 17 mL via INTRAVENOUS

## 2020-03-24 NOTE — Progress Notes (Signed)
Kindly inform the patient that EEG study was normal

## 2020-04-27 ENCOUNTER — Ambulatory Visit (INDEPENDENT_AMBULATORY_CARE_PROVIDER_SITE_OTHER): Payer: Medicare Other | Admitting: Neurology

## 2020-04-27 ENCOUNTER — Encounter: Payer: Self-pay | Admitting: Neurology

## 2020-04-27 ENCOUNTER — Other Ambulatory Visit: Payer: Self-pay

## 2020-04-27 VITALS — BP 123/72 | HR 55 | Ht 74.0 in | Wt 193.0 lb

## 2020-04-27 DIAGNOSIS — I6521 Occlusion and stenosis of right carotid artery: Secondary | ICD-10-CM

## 2020-04-27 NOTE — Progress Notes (Signed)
[facility] [address] [place]. [place] [address] [phone]       OFFICE FOLLOW UP VISIT NOTE  [name] Date of Birth:  01/06/1935 Medical Record Number:  [id number]   Referring MD: Marden Noble. Brigitte Pulse  Reason for Referral: TIA  HPI: Initial consult 02/18/2020 : [name] is a 85 year old pleasant Caucasian male seen today for initial office consultation visit for TIA.  History is obtained from patient and his wife as well as review of referral notes and I personally reviewed available imaging films in PACS.  He has a past medical history of hypertension, hyperlipidemia, peripheral arterial disease, diabetes, coronary artery disease and arthritis who presented on 01/31/2020 with sudden onset of garbled speech and right facial droop noticed by his wife.  This lasted only about 3 to 5 minutes and resolved.  By the time EMS arrived he was already getting better and he refused to go to the ER.Marland Kitchen  He also complained of some numbness in the right hand but he is not quite sure to his wife insist on it.  He subsequently saw his primary care physician who ordered an MRI scan of the brain which was done on 02/12/2020 which shows old bilateral remote lacunar infarcts but no acute infarct.  There is subtle enhancement of the right 7th nerve but this is of unclear significance.  Patient denies any prior known history of strokes or TIAs.  He has been started on aspirin which is tolerating well without bruising or bleeding.  He states his sugars have all been under good control however I cannot find any recent hemoglobin A1c or lipid lipid profile in the computer.  He does have history of statin intolerance and has not been taking statins.  He has some mild memory loss and cognitive decline at baseline last Mini-Mental was 21/30 in January in [name] office.  He was started on Aricept but could not tolerate it due to diarrhea and discontinued it.  He does have neuropathy and and some gait  difficulty which is longstanding but does use a walker and walks reasonably well without significant balance issues or frequent falls.  He had EKG done on 03/03/2020 in [name] office which showed sinus arrhythmia with first-degree AV block.  No A. fib.  Lab work on 02/03/2020 showed elevated random glucose of 185 mg percent with CBC and chemistries were unremarkable except slightly low sodium of 132. Update 04/27/2020 : He returns for follow-up after last visit 2 months ago.  He is accompanied by his son.  He states his is doing well he has had no recurrent TIA or stroke symptoms.  He underwent MRI scan of the brain on 02/12/2020 which showed old lacunar infarct in no acute infarct.  MRA of the brain and neck done on 03/24/2020 showed 80% proximal right ICA and 30% left proximal ICA stenosis.  Lab work on 02/18/2020 showed hemoglobin A1c 6.8 and LDL cholesterol 170 mg percent.  Patient has refused statin in the past because of intolerance to Crestor and developed muscle aches and pains as well as memory loss.  He and his wife are quite adamant they did not want to try any other statin or other cholesterol medication.  Patient has not had any previous carotid ultrasound and does not have known history of carotid stenosis.  He continues to smoke.  His blood pressures under good control and today it is 123/72.  He is tolerating Plavix well without bruising or bleeding.  States  his sugars are under good control. ROS:   14 system review of systems is positive for memory loss, numbness, normal speech, facial weakness, gait and balance difficulties all other systems negative  PMH:  Past Medical History:  Diagnosis Date  . Arthritis   . CAD (coronary artery disease)   . Cataract   . Diabetes mellitus   . GERD (gastroesophageal reflux disease)   . Helicobacter pylori gastritis 01/2006, 03/2011   EGD - Pylera Tx 2010  . Hiccups   . History of kidney stones   . Hyperlipidemia   . Hypertension   . PAD (peripheral  artery disease) ([place])     Social History:  Social History   Socioeconomic History  . Marital status: Married    Spouse name: Not on file  . Number of children: 2  . Years of education: Not on file  . Highest education level: Not on file  Occupational History  . Occupation: Retired    Fish farm manager: RETIRED     Comment: sales  Tobacco Use  . Smoking status: Current Every Day Smoker    Packs/day: 1.00    Years: 40.00    Pack years: 40.00    Types: Cigarettes  . Smokeless tobacco: Never Used  Vaping Use  . Vaping Use: Never used  Substance and Sexual Activity  . Alcohol use: Yes    Alcohol/week: 0.0 standard drinks    Comment: 1 time per month  . Drug use: No  . Sexual activity: Not on file  Other Topics Concern  . Not on file  Social History Narrative   Lives at home with wife    Right handed   Caffeine: 5 cups coffee per day    Social Determinants of Health   Financial Resource Strain:   . Difficulty of Paying Living Expenses:   Food Insecurity:   . Worried About Charity fundraiser in the Last Year:   . Arboriculturist in the Last Year:   Transportation Needs:   . Film/video editor (Medical):   Marland Kitchen Lack of Transportation (Non-Medical):   Physical Activity:   . Days of Exercise per Week:   . Minutes of Exercise per Session:   Stress:   . Feeling of Stress :   Social Connections:   . Frequency of Communication with Friends and Family:   . Frequency of Social Gatherings with Friends and Family:   . Attends Religious Services:   . Active Member of Clubs or Organizations:   . Attends Archivist Meetings:   Marland Kitchen Marital Status:   Intimate Partner Violence:   . Fear of Current or Ex-Partner:   . Emotionally Abused:   Marland Kitchen Physically Abused:   . Sexually Abused:     Medications:   Current Outpatient Medications on File Prior to Visit  Medication Sig Dispense Refill  . clopidogrel (PLAVIX) 75 MG tablet Take 1 tablet (75 mg total) by mouth daily. 30  tablet 11  . Coenzyme Q10 (CO Q-10 PO) Take 300 mg by mouth 2 (two) times a week. Take on Mon and Fri.    . metFORMIN (GLUCOPHAGE) 1000 MG tablet Take 1 tablet by mouth Daily.     . metoprolol succinate (TOPROL-XL) 50 MG 24 hr tablet Take 25 mg by mouth daily.   2  . pantoprazole (PROTONIX) 40 MG tablet Take 40 mg by mouth daily.    . ramipril (ALTACE) 5 MG capsule Take 1 tablet by mouth Daily.     Marland Kitchen  amoxicillin (AMOXIL) 500 MG capsule TAKE 4 CAPSULES BY MOUTH 30 MINUTES PRIOR TO DENTAL WORK.  1  . aspirin 81 MG tablet Take 81 mg by mouth daily.   (Patient not taking: Reported on 04/27/2020)     No current facility-administered medications on file prior to visit.    Allergies:   Allergies  Allergen Reactions  . Rosuvastatin   . Sulfonamide Derivatives Hives    Physical Exam General: well developed, well nourished elderly Caucasian male, seated, in no evident distress Head: head normocephalic and atraumatic.   Neck: supple with no carotid or supraclavicular bruits Cardiovascular: regular rate and rhythm, no murmurs Musculoskeletal: no deformity Skin:  no rash/petichiae Vascular:  Normal pulses all extremities  Neurologic Exam Mental Status: Awake and fully alert. Oriented to place and time. Recent and remote memory diminished attention span, concentration and fund of knowledge poor. Mood and affect appropriate.  Mini-Mental status exam not done Cranial Nerves: Fundoscopic exam not done. Pupils equal, briskly reactive to light. Extraocular movements full without nystagmus. Visual fields full to confrontation. Hearing mildly diminished bilaterally. Facial sensation intact. Face, tongue, palate moves normally and symmetrically.  Motor: Normal bulk and tone. Normal strength in all tested extremity muscles in upper extremities but mild bilateral hip flexor weakness left greater than right and bilateral ankle dorsiflexor weakness left greater than right.  Mild left foot drop. Sensory.:   Diminished touch pinprick sensation in both lower extremities from ankle down as well as vibration. Coordination: Rapid alternating movements normal in all extremities. Finger-to-nose and heel-to-shin performed accurately bilaterally. Gait and Station: Not tested as patient is in wheelchair and did not bring his walker. Reflexes: 1+ and symmetric except both ankle jerks are absent.. Toes downgoing.      ASSESSMENT: 85 year old Caucasian male with a transient episode of facial droop and face and arm numbness likely due to left hemispheric subcortical TIA from small vessel disease.  MRI scan shows multiple silent lacunar infarcts and MRA shows high-grade proximal right carotid stenosis.  Multiple vascular risk factors of hypertension hyperlipidemia diabetes and age.  Also longstanding cognitive impairment and memory loss likely due to mild Alzheimer's dementia.  He also has longstanding diabetic peripheral neuropathy and chronic gait difficulty     PLAN: I had a long discussion with the patient and his son regarding his recent left brain TIA as well as discussed results of the MRI scan and MRA of the neck which show 80% right carotid stenosis.  I recommend further evaluation of this by checking carotid ultrasound and if this is confirmed he may need referral to vascular surgeon to consider an surgical or endovascular treatment.  I counseled the patient to quit smoking completely and continue Plavix for stroke prevention with strict control of hypertension and blood pressure goal below 130/90, lipids with LDL cholesterol goal below 70 mg percent and diabetes with hemoglobin A1c goal below 6.5%.  The patient has history of intolerance to Crestor and is refusing to try a second statin.  He may benefit with consideration for starting Repatha the new PCSK9 inhibitor injection which has a different side effect profile.  He will return for follow-up in the future in 6 months or call earlier if necessary.Greater  than 50% time during this 30-minute   visit was spent on counseling and coordination of care about his TIA , carotid stenosis and dementia and discussion about evaluation and treatment , carotid revascularization risk benefit,and answering questions  .  Return for follow-up in 6 months or  call earlier if necessary. Antony Contras, MD  [facility] [address] [place], [place] [address]  Phone [phone] Fax [phone] Note: This document was prepared with digital dictation and possible smart phrase technology. Any transcriptional errors that result from this process are unintentional.

## 2020-04-27 NOTE — Patient Instructions (Signed)
I had a long discussion with the patient and his son regarding his recent left brain TIA as well as discussed results of the MRI scan and MRA of the neck which show 80% right carotid stenosis.  I recommend further evaluation of this by checking carotid ultrasound and if this is confirmed he may need referral to vascular surgeon to consider an surgical or endovascular treatment.  I counseled the patient to quit smoking completely and continue Plavix for stroke prevention with strict control of hypertension and blood pressure goal below 130/90, lipids with LDL cholesterol goal below 70 mg percent and diabetes with hemoglobin A1c goal below 6.5%.  The patient has history of intolerance to Crestor and is refusing to try a second statin.  He may benefit with consideration for starting Repatha the new PCSK9 inhibitor injection which has a different side effect profile.  He will return for follow-up in the future in 6 months or call earlier if necessary. Evolocumab injection What is this medicine? EVOLOCUMAB (e voe LOK ue mab) is known as a PCSK9 inhibitor. It is used to lower the level of cholesterol in the blood. It may be used alone or in combination with other cholesterol-lowering drugs. This drug may also be used to reduce the risk of heart attack, stroke, and certain types of heart surgery in patients with heart disease. This medicine may be used for other purposes; ask your health care provider or pharmacist if you have questions. COMMON BRAND NAME(S): Repatha What should I tell my health care provider before I take this medicine? They need to know if you have any of these conditions:  an unusual or allergic reaction to evolocumab, other medicines, latex, foods, dyes, or preservatives  pregnant or trying to get pregnant  breast-feeding How should I use this medicine? This medicine is for injection under the skin. You will be taught how to prepare and give this medicine. Use exactly as directed. Take  your medicine at regular intervals. Do not take your medicine more often than directed. It is important that you put your used needles and syringes in a special sharps container. Do not put them in a trash can. If you do not have a sharps container, call your pharmacist or health care provider to get one. Talk to your pediatrician regarding the use of this medicine in children. While this drug may be prescribed for children as young as 13 years for selected conditions, precautions do apply. Overdosage: If you think you have taken too much of this medicine contact a poison control center or emergency room at once. NOTE: This medicine is only for you. Do not share this medicine with others. What if I miss a dose? If you miss a dose, take it as soon as you can if there are more than 7 days until the next scheduled dose, or skip the missed dose and take the next dose according to your original schedule. Do not take double or extra doses. What may interact with this medicine? Interactions are not expected. This list may not describe all possible interactions. Give your health care provider a list of all the medicines, herbs, non-prescription drugs, or dietary supplements you use. Also tell them if you smoke, drink alcohol, or use illegal drugs. Some items may interact with your medicine. What should I watch for while using this medicine? Visit your health care provider for regular checks on your progress. Tell your health care provider if your symptoms do not start to get better or  if they get worse. You may need blood work done while you are taking this drug. Do not wear the on-body infuser during an MRI. What side effects may I notice from receiving this medicine? Side effects that you should report to your doctor or health care professional as soon as possible:  allergic reactions like skin rash, itching or hives, swelling of the face, lips, or tongue  signs and symptoms of high blood sugar such as  dizziness; dry mouth; dry skin; fruity breath; nausea; stomach pain; increased hunger or thirst; increased urination  signs and symptoms of infection like fever or chills; cough; sore throat; pain or trouble passing urine Side effects that usually do not require medical attention (report to your doctor or health care professional if they continue or are bothersome):  diarrhea  nausea  muscle pain  pain, redness, or irritation at site where injected This list may not describe all possible side effects. Call your doctor for medical advice about side effects. You may report side effects to FDA at 1-800-FDA-1088. Where should I keep my medicine? Keep out of the reach of children. You will be instructed on how to store this medicine. Throw away any unused medicine after the expiration date on the label. NOTE: This sheet is a summary. It may not cover all possible information. If you have questions about this medicine, talk to your doctor, pharmacist, or health care provider.  2020 Elsevier/Gold Standard (2019-08-20 16:22:29)

## 2020-05-07 ENCOUNTER — Ambulatory Visit (HOSPITAL_COMMUNITY)
Admission: RE | Admit: 2020-05-07 | Discharge: 2020-05-07 | Disposition: A | Discharge status: Home / Self Care | Payer: Medicare Other | Source: Ambulatory Visit | Attending: Neurology | Admitting: Neurology

## 2020-05-07 ENCOUNTER — Other Ambulatory Visit: Payer: Self-pay

## 2020-05-07 DIAGNOSIS — I6521 Occlusion and stenosis of right carotid artery: Secondary | ICD-10-CM | POA: Insufficient documentation

## 2020-05-07 NOTE — Progress Notes (Signed)
Carotid duplex has been completed.   Preliminary results in CV Proc.   Abram Sander 05/07/2020 1:20 PM

## 2020-05-19 ENCOUNTER — Encounter: Payer: Self-pay | Admitting: *Deleted

## 2020-05-19 NOTE — Progress Notes (Signed)
Kindly inform the patient her carotid ultrasound study showed no significant blockages of either carotid arteries in the neck.

## 2020-05-29 ENCOUNTER — Ambulatory Visit: Payer: Medicare Other | Admitting: Podiatry

## 2020-06-05 DIAGNOSIS — I739 Peripheral vascular disease, unspecified: Secondary | ICD-10-CM | POA: Diagnosis not present

## 2020-06-05 DIAGNOSIS — I129 Hypertensive chronic kidney disease with stage 1 through stage 4 chronic kidney disease, or unspecified chronic kidney disease: Secondary | ICD-10-CM | POA: Diagnosis not present

## 2020-06-05 DIAGNOSIS — E1151 Type 2 diabetes mellitus with diabetic peripheral angiopathy without gangrene: Secondary | ICD-10-CM | POA: Diagnosis not present

## 2020-06-05 DIAGNOSIS — F0390 Unspecified dementia without behavioral disturbance: Secondary | ICD-10-CM | POA: Diagnosis not present

## 2020-06-05 DIAGNOSIS — Z8673 Personal history of transient ischemic attack (TIA), and cerebral infarction without residual deficits: Secondary | ICD-10-CM | POA: Diagnosis not present

## 2020-06-05 DIAGNOSIS — E785 Hyperlipidemia, unspecified: Secondary | ICD-10-CM | POA: Diagnosis not present

## 2020-06-05 DIAGNOSIS — I25810 Atherosclerosis of coronary artery bypass graft(s) without angina pectoris: Secondary | ICD-10-CM | POA: Diagnosis not present

## 2020-06-05 DIAGNOSIS — N1831 Chronic kidney disease, stage 3a: Secondary | ICD-10-CM | POA: Diagnosis not present

## 2020-06-05 DIAGNOSIS — E1149 Type 2 diabetes mellitus with other diabetic neurological complication: Secondary | ICD-10-CM | POA: Diagnosis not present

## 2020-06-05 DIAGNOSIS — E871 Hypo-osmolality and hyponatremia: Secondary | ICD-10-CM | POA: Diagnosis not present

## 2020-07-02 ENCOUNTER — Ambulatory Visit: Payer: Medicare Other | Admitting: Cardiology

## 2020-07-14 ENCOUNTER — Telehealth: Payer: Self-pay | Admitting: Cardiology

## 2020-07-14 NOTE — Telephone Encounter (Signed)
I did not need this encounter. °

## 2020-08-08 DIAGNOSIS — Z23 Encounter for immunization: Secondary | ICD-10-CM | POA: Diagnosis not present

## 2020-08-25 ENCOUNTER — Ambulatory Visit: Payer: Medicare Other | Admitting: Podiatry

## 2020-08-27 DIAGNOSIS — Z23 Encounter for immunization: Secondary | ICD-10-CM | POA: Diagnosis not present

## 2020-10-27 ENCOUNTER — Ambulatory Visit: Payer: Medicare Other | Admitting: Neurology

## 2020-11-03 ENCOUNTER — Ambulatory Visit: Payer: Medicare Other | Admitting: Podiatry

## 2020-11-24 DIAGNOSIS — Z125 Encounter for screening for malignant neoplasm of prostate: Secondary | ICD-10-CM | POA: Diagnosis not present

## 2020-11-24 DIAGNOSIS — E1149 Type 2 diabetes mellitus with other diabetic neurological complication: Secondary | ICD-10-CM | POA: Diagnosis not present

## 2020-11-24 DIAGNOSIS — E785 Hyperlipidemia, unspecified: Secondary | ICD-10-CM | POA: Diagnosis not present

## 2020-11-27 DIAGNOSIS — I10 Essential (primary) hypertension: Secondary | ICD-10-CM | POA: Diagnosis not present

## 2020-11-30 DIAGNOSIS — I739 Peripheral vascular disease, unspecified: Secondary | ICD-10-CM | POA: Diagnosis not present

## 2020-11-30 DIAGNOSIS — E1151 Type 2 diabetes mellitus with diabetic peripheral angiopathy without gangrene: Secondary | ICD-10-CM | POA: Diagnosis not present

## 2020-11-30 DIAGNOSIS — M6281 Muscle weakness (generalized): Secondary | ICD-10-CM | POA: Diagnosis not present

## 2020-11-30 DIAGNOSIS — Z0000 Encounter for general adult medical examination without abnormal findings: Secondary | ICD-10-CM | POA: Diagnosis not present

## 2020-11-30 DIAGNOSIS — I714 Abdominal aortic aneurysm, without rupture: Secondary | ICD-10-CM | POA: Diagnosis not present

## 2020-11-30 DIAGNOSIS — E785 Hyperlipidemia, unspecified: Secondary | ICD-10-CM | POA: Diagnosis not present

## 2020-11-30 DIAGNOSIS — F0390 Unspecified dementia without behavioral disturbance: Secondary | ICD-10-CM | POA: Diagnosis not present

## 2020-11-30 DIAGNOSIS — I129 Hypertensive chronic kidney disease with stage 1 through stage 4 chronic kidney disease, or unspecified chronic kidney disease: Secondary | ICD-10-CM | POA: Diagnosis not present

## 2020-11-30 DIAGNOSIS — R82998 Other abnormal findings in urine: Secondary | ICD-10-CM | POA: Diagnosis not present

## 2020-11-30 DIAGNOSIS — E1149 Type 2 diabetes mellitus with other diabetic neurological complication: Secondary | ICD-10-CM | POA: Diagnosis not present

## 2020-11-30 DIAGNOSIS — M48061 Spinal stenosis, lumbar region without neurogenic claudication: Secondary | ICD-10-CM | POA: Diagnosis not present

## 2020-11-30 DIAGNOSIS — Z8673 Personal history of transient ischemic attack (TIA), and cerebral infarction without residual deficits: Secondary | ICD-10-CM | POA: Diagnosis not present

## 2020-11-30 DIAGNOSIS — N1831 Chronic kidney disease, stage 3a: Secondary | ICD-10-CM | POA: Diagnosis not present

## 2020-12-11 DIAGNOSIS — E1151 Type 2 diabetes mellitus with diabetic peripheral angiopathy without gangrene: Secondary | ICD-10-CM | POA: Diagnosis not present

## 2020-12-11 DIAGNOSIS — Z7984 Long term (current) use of oral hypoglycemic drugs: Secondary | ICD-10-CM | POA: Diagnosis not present

## 2020-12-11 DIAGNOSIS — F17200 Nicotine dependence, unspecified, uncomplicated: Secondary | ICD-10-CM | POA: Diagnosis not present

## 2020-12-11 DIAGNOSIS — N1831 Chronic kidney disease, stage 3a: Secondary | ICD-10-CM | POA: Diagnosis not present

## 2020-12-11 DIAGNOSIS — M48061 Spinal stenosis, lumbar region without neurogenic claudication: Secondary | ICD-10-CM | POA: Diagnosis not present

## 2020-12-11 DIAGNOSIS — E1122 Type 2 diabetes mellitus with diabetic chronic kidney disease: Secondary | ICD-10-CM | POA: Diagnosis not present

## 2020-12-11 DIAGNOSIS — I129 Hypertensive chronic kidney disease with stage 1 through stage 4 chronic kidney disease, or unspecified chronic kidney disease: Secondary | ICD-10-CM | POA: Diagnosis not present

## 2020-12-11 DIAGNOSIS — I25810 Atherosclerosis of coronary artery bypass graft(s) without angina pectoris: Secondary | ICD-10-CM | POA: Diagnosis not present

## 2020-12-11 DIAGNOSIS — F0390 Unspecified dementia without behavioral disturbance: Secondary | ICD-10-CM | POA: Diagnosis not present

## 2020-12-11 DIAGNOSIS — Z7902 Long term (current) use of antithrombotics/antiplatelets: Secondary | ICD-10-CM | POA: Diagnosis not present

## 2020-12-11 DIAGNOSIS — E1142 Type 2 diabetes mellitus with diabetic polyneuropathy: Secondary | ICD-10-CM | POA: Diagnosis not present

## 2020-12-11 DIAGNOSIS — Z9181 History of falling: Secondary | ICD-10-CM | POA: Diagnosis not present

## 2020-12-15 DIAGNOSIS — E1142 Type 2 diabetes mellitus with diabetic polyneuropathy: Secondary | ICD-10-CM | POA: Diagnosis not present

## 2020-12-15 DIAGNOSIS — E1151 Type 2 diabetes mellitus with diabetic peripheral angiopathy without gangrene: Secondary | ICD-10-CM | POA: Diagnosis not present

## 2020-12-15 DIAGNOSIS — N1831 Chronic kidney disease, stage 3a: Secondary | ICD-10-CM | POA: Diagnosis not present

## 2020-12-15 DIAGNOSIS — E1122 Type 2 diabetes mellitus with diabetic chronic kidney disease: Secondary | ICD-10-CM | POA: Diagnosis not present

## 2020-12-15 DIAGNOSIS — I129 Hypertensive chronic kidney disease with stage 1 through stage 4 chronic kidney disease, or unspecified chronic kidney disease: Secondary | ICD-10-CM | POA: Diagnosis not present

## 2020-12-15 DIAGNOSIS — M48061 Spinal stenosis, lumbar region without neurogenic claudication: Secondary | ICD-10-CM | POA: Diagnosis not present

## 2020-12-17 DIAGNOSIS — E1142 Type 2 diabetes mellitus with diabetic polyneuropathy: Secondary | ICD-10-CM | POA: Diagnosis not present

## 2020-12-17 DIAGNOSIS — M48061 Spinal stenosis, lumbar region without neurogenic claudication: Secondary | ICD-10-CM | POA: Diagnosis not present

## 2020-12-17 DIAGNOSIS — I129 Hypertensive chronic kidney disease with stage 1 through stage 4 chronic kidney disease, or unspecified chronic kidney disease: Secondary | ICD-10-CM | POA: Diagnosis not present

## 2020-12-17 DIAGNOSIS — N1831 Chronic kidney disease, stage 3a: Secondary | ICD-10-CM | POA: Diagnosis not present

## 2020-12-17 DIAGNOSIS — E1122 Type 2 diabetes mellitus with diabetic chronic kidney disease: Secondary | ICD-10-CM | POA: Diagnosis not present

## 2020-12-17 DIAGNOSIS — E1151 Type 2 diabetes mellitus with diabetic peripheral angiopathy without gangrene: Secondary | ICD-10-CM | POA: Diagnosis not present

## 2020-12-21 ENCOUNTER — Ambulatory Visit: Payer: Medicare Other | Admitting: Neurology

## 2020-12-23 DIAGNOSIS — E1122 Type 2 diabetes mellitus with diabetic chronic kidney disease: Secondary | ICD-10-CM | POA: Diagnosis not present

## 2020-12-23 DIAGNOSIS — M48061 Spinal stenosis, lumbar region without neurogenic claudication: Secondary | ICD-10-CM | POA: Diagnosis not present

## 2020-12-23 DIAGNOSIS — I129 Hypertensive chronic kidney disease with stage 1 through stage 4 chronic kidney disease, or unspecified chronic kidney disease: Secondary | ICD-10-CM | POA: Diagnosis not present

## 2020-12-23 DIAGNOSIS — E1151 Type 2 diabetes mellitus with diabetic peripheral angiopathy without gangrene: Secondary | ICD-10-CM | POA: Diagnosis not present

## 2020-12-23 DIAGNOSIS — N1831 Chronic kidney disease, stage 3a: Secondary | ICD-10-CM | POA: Diagnosis not present

## 2020-12-23 DIAGNOSIS — E1142 Type 2 diabetes mellitus with diabetic polyneuropathy: Secondary | ICD-10-CM | POA: Diagnosis not present

## 2020-12-25 DIAGNOSIS — N1831 Chronic kidney disease, stage 3a: Secondary | ICD-10-CM | POA: Diagnosis not present

## 2020-12-25 DIAGNOSIS — E1122 Type 2 diabetes mellitus with diabetic chronic kidney disease: Secondary | ICD-10-CM | POA: Diagnosis not present

## 2020-12-25 DIAGNOSIS — E1151 Type 2 diabetes mellitus with diabetic peripheral angiopathy without gangrene: Secondary | ICD-10-CM | POA: Diagnosis not present

## 2020-12-25 DIAGNOSIS — M48061 Spinal stenosis, lumbar region without neurogenic claudication: Secondary | ICD-10-CM | POA: Diagnosis not present

## 2020-12-25 DIAGNOSIS — E1142 Type 2 diabetes mellitus with diabetic polyneuropathy: Secondary | ICD-10-CM | POA: Diagnosis not present

## 2020-12-25 DIAGNOSIS — I129 Hypertensive chronic kidney disease with stage 1 through stage 4 chronic kidney disease, or unspecified chronic kidney disease: Secondary | ICD-10-CM | POA: Diagnosis not present

## 2020-12-29 DIAGNOSIS — E1151 Type 2 diabetes mellitus with diabetic peripheral angiopathy without gangrene: Secondary | ICD-10-CM | POA: Diagnosis not present

## 2020-12-29 DIAGNOSIS — N1831 Chronic kidney disease, stage 3a: Secondary | ICD-10-CM | POA: Diagnosis not present

## 2020-12-29 DIAGNOSIS — E1122 Type 2 diabetes mellitus with diabetic chronic kidney disease: Secondary | ICD-10-CM | POA: Diagnosis not present

## 2020-12-29 DIAGNOSIS — E1142 Type 2 diabetes mellitus with diabetic polyneuropathy: Secondary | ICD-10-CM | POA: Diagnosis not present

## 2020-12-29 DIAGNOSIS — M48061 Spinal stenosis, lumbar region without neurogenic claudication: Secondary | ICD-10-CM | POA: Diagnosis not present

## 2020-12-29 DIAGNOSIS — I129 Hypertensive chronic kidney disease with stage 1 through stage 4 chronic kidney disease, or unspecified chronic kidney disease: Secondary | ICD-10-CM | POA: Diagnosis not present

## 2020-12-31 DIAGNOSIS — N1831 Chronic kidney disease, stage 3a: Secondary | ICD-10-CM | POA: Diagnosis not present

## 2020-12-31 DIAGNOSIS — I129 Hypertensive chronic kidney disease with stage 1 through stage 4 chronic kidney disease, or unspecified chronic kidney disease: Secondary | ICD-10-CM | POA: Diagnosis not present

## 2020-12-31 DIAGNOSIS — E1122 Type 2 diabetes mellitus with diabetic chronic kidney disease: Secondary | ICD-10-CM | POA: Diagnosis not present

## 2020-12-31 DIAGNOSIS — M48061 Spinal stenosis, lumbar region without neurogenic claudication: Secondary | ICD-10-CM | POA: Diagnosis not present

## 2020-12-31 DIAGNOSIS — E1142 Type 2 diabetes mellitus with diabetic polyneuropathy: Secondary | ICD-10-CM | POA: Diagnosis not present

## 2020-12-31 DIAGNOSIS — E1151 Type 2 diabetes mellitus with diabetic peripheral angiopathy without gangrene: Secondary | ICD-10-CM | POA: Diagnosis not present

## 2021-01-05 DIAGNOSIS — E1142 Type 2 diabetes mellitus with diabetic polyneuropathy: Secondary | ICD-10-CM | POA: Diagnosis not present

## 2021-01-05 DIAGNOSIS — M48061 Spinal stenosis, lumbar region without neurogenic claudication: Secondary | ICD-10-CM | POA: Diagnosis not present

## 2021-01-05 DIAGNOSIS — E1151 Type 2 diabetes mellitus with diabetic peripheral angiopathy without gangrene: Secondary | ICD-10-CM | POA: Diagnosis not present

## 2021-01-05 DIAGNOSIS — N1831 Chronic kidney disease, stage 3a: Secondary | ICD-10-CM | POA: Diagnosis not present

## 2021-01-05 DIAGNOSIS — E1122 Type 2 diabetes mellitus with diabetic chronic kidney disease: Secondary | ICD-10-CM | POA: Diagnosis not present

## 2021-01-05 DIAGNOSIS — I129 Hypertensive chronic kidney disease with stage 1 through stage 4 chronic kidney disease, or unspecified chronic kidney disease: Secondary | ICD-10-CM | POA: Diagnosis not present

## 2021-01-08 DIAGNOSIS — E1151 Type 2 diabetes mellitus with diabetic peripheral angiopathy without gangrene: Secondary | ICD-10-CM | POA: Diagnosis not present

## 2021-01-08 DIAGNOSIS — M48061 Spinal stenosis, lumbar region without neurogenic claudication: Secondary | ICD-10-CM | POA: Diagnosis not present

## 2021-01-08 DIAGNOSIS — I129 Hypertensive chronic kidney disease with stage 1 through stage 4 chronic kidney disease, or unspecified chronic kidney disease: Secondary | ICD-10-CM | POA: Diagnosis not present

## 2021-01-08 DIAGNOSIS — E1122 Type 2 diabetes mellitus with diabetic chronic kidney disease: Secondary | ICD-10-CM | POA: Diagnosis not present

## 2021-01-08 DIAGNOSIS — N1831 Chronic kidney disease, stage 3a: Secondary | ICD-10-CM | POA: Diagnosis not present

## 2021-01-08 DIAGNOSIS — E1142 Type 2 diabetes mellitus with diabetic polyneuropathy: Secondary | ICD-10-CM | POA: Diagnosis not present

## 2021-01-10 DIAGNOSIS — F0390 Unspecified dementia without behavioral disturbance: Secondary | ICD-10-CM | POA: Diagnosis not present

## 2021-01-10 DIAGNOSIS — Z7902 Long term (current) use of antithrombotics/antiplatelets: Secondary | ICD-10-CM | POA: Diagnosis not present

## 2021-01-10 DIAGNOSIS — Z7984 Long term (current) use of oral hypoglycemic drugs: Secondary | ICD-10-CM | POA: Diagnosis not present

## 2021-01-10 DIAGNOSIS — E1122 Type 2 diabetes mellitus with diabetic chronic kidney disease: Secondary | ICD-10-CM | POA: Diagnosis not present

## 2021-01-10 DIAGNOSIS — M48061 Spinal stenosis, lumbar region without neurogenic claudication: Secondary | ICD-10-CM | POA: Diagnosis not present

## 2021-01-10 DIAGNOSIS — I25810 Atherosclerosis of coronary artery bypass graft(s) without angina pectoris: Secondary | ICD-10-CM | POA: Diagnosis not present

## 2021-01-10 DIAGNOSIS — N1831 Chronic kidney disease, stage 3a: Secondary | ICD-10-CM | POA: Diagnosis not present

## 2021-01-10 DIAGNOSIS — I129 Hypertensive chronic kidney disease with stage 1 through stage 4 chronic kidney disease, or unspecified chronic kidney disease: Secondary | ICD-10-CM | POA: Diagnosis not present

## 2021-01-10 DIAGNOSIS — E1151 Type 2 diabetes mellitus with diabetic peripheral angiopathy without gangrene: Secondary | ICD-10-CM | POA: Diagnosis not present

## 2021-01-10 DIAGNOSIS — Z9181 History of falling: Secondary | ICD-10-CM | POA: Diagnosis not present

## 2021-01-10 DIAGNOSIS — E1142 Type 2 diabetes mellitus with diabetic polyneuropathy: Secondary | ICD-10-CM | POA: Diagnosis not present

## 2021-01-10 DIAGNOSIS — F17200 Nicotine dependence, unspecified, uncomplicated: Secondary | ICD-10-CM | POA: Diagnosis not present

## 2021-01-14 DIAGNOSIS — E1142 Type 2 diabetes mellitus with diabetic polyneuropathy: Secondary | ICD-10-CM | POA: Diagnosis not present

## 2021-01-14 DIAGNOSIS — M48061 Spinal stenosis, lumbar region without neurogenic claudication: Secondary | ICD-10-CM | POA: Diagnosis not present

## 2021-01-14 DIAGNOSIS — E1151 Type 2 diabetes mellitus with diabetic peripheral angiopathy without gangrene: Secondary | ICD-10-CM | POA: Diagnosis not present

## 2021-01-14 DIAGNOSIS — N1831 Chronic kidney disease, stage 3a: Secondary | ICD-10-CM | POA: Diagnosis not present

## 2021-01-14 DIAGNOSIS — I129 Hypertensive chronic kidney disease with stage 1 through stage 4 chronic kidney disease, or unspecified chronic kidney disease: Secondary | ICD-10-CM | POA: Diagnosis not present

## 2021-01-14 DIAGNOSIS — E1122 Type 2 diabetes mellitus with diabetic chronic kidney disease: Secondary | ICD-10-CM | POA: Diagnosis not present

## 2021-01-19 DIAGNOSIS — E1151 Type 2 diabetes mellitus with diabetic peripheral angiopathy without gangrene: Secondary | ICD-10-CM | POA: Diagnosis not present

## 2021-01-19 DIAGNOSIS — N1831 Chronic kidney disease, stage 3a: Secondary | ICD-10-CM | POA: Diagnosis not present

## 2021-01-19 DIAGNOSIS — M48061 Spinal stenosis, lumbar region without neurogenic claudication: Secondary | ICD-10-CM | POA: Diagnosis not present

## 2021-01-19 DIAGNOSIS — E1122 Type 2 diabetes mellitus with diabetic chronic kidney disease: Secondary | ICD-10-CM | POA: Diagnosis not present

## 2021-01-19 DIAGNOSIS — I129 Hypertensive chronic kidney disease with stage 1 through stage 4 chronic kidney disease, or unspecified chronic kidney disease: Secondary | ICD-10-CM | POA: Diagnosis not present

## 2021-01-19 DIAGNOSIS — E1142 Type 2 diabetes mellitus with diabetic polyneuropathy: Secondary | ICD-10-CM | POA: Diagnosis not present

## 2021-01-26 DIAGNOSIS — I129 Hypertensive chronic kidney disease with stage 1 through stage 4 chronic kidney disease, or unspecified chronic kidney disease: Secondary | ICD-10-CM | POA: Diagnosis not present

## 2021-01-26 DIAGNOSIS — E1151 Type 2 diabetes mellitus with diabetic peripheral angiopathy without gangrene: Secondary | ICD-10-CM | POA: Diagnosis not present

## 2021-01-26 DIAGNOSIS — E1122 Type 2 diabetes mellitus with diabetic chronic kidney disease: Secondary | ICD-10-CM | POA: Diagnosis not present

## 2021-01-26 DIAGNOSIS — M48061 Spinal stenosis, lumbar region without neurogenic claudication: Secondary | ICD-10-CM | POA: Diagnosis not present

## 2021-01-26 DIAGNOSIS — N1831 Chronic kidney disease, stage 3a: Secondary | ICD-10-CM | POA: Diagnosis not present

## 2021-01-26 DIAGNOSIS — E1142 Type 2 diabetes mellitus with diabetic polyneuropathy: Secondary | ICD-10-CM | POA: Diagnosis not present

## 2021-01-28 DIAGNOSIS — I10 Essential (primary) hypertension: Secondary | ICD-10-CM | POA: Diagnosis not present

## 2021-01-28 DIAGNOSIS — K219 Gastro-esophageal reflux disease without esophagitis: Secondary | ICD-10-CM | POA: Diagnosis not present

## 2021-01-28 DIAGNOSIS — E785 Hyperlipidemia, unspecified: Secondary | ICD-10-CM | POA: Diagnosis not present

## 2021-01-28 DIAGNOSIS — N1831 Chronic kidney disease, stage 3a: Secondary | ICD-10-CM | POA: Diagnosis not present

## 2021-01-28 DIAGNOSIS — E1151 Type 2 diabetes mellitus with diabetic peripheral angiopathy without gangrene: Secondary | ICD-10-CM | POA: Diagnosis not present

## 2021-01-28 DIAGNOSIS — I129 Hypertensive chronic kidney disease with stage 1 through stage 4 chronic kidney disease, or unspecified chronic kidney disease: Secondary | ICD-10-CM | POA: Diagnosis not present

## 2021-01-28 DIAGNOSIS — E1122 Type 2 diabetes mellitus with diabetic chronic kidney disease: Secondary | ICD-10-CM | POA: Diagnosis not present

## 2021-01-29 ENCOUNTER — Other Ambulatory Visit: Payer: Self-pay

## 2021-01-29 ENCOUNTER — Ambulatory Visit (INDEPENDENT_AMBULATORY_CARE_PROVIDER_SITE_OTHER): Payer: Medicare Other | Admitting: Podiatry

## 2021-01-29 ENCOUNTER — Encounter: Payer: Self-pay | Admitting: Podiatry

## 2021-01-29 DIAGNOSIS — B351 Tinea unguium: Secondary | ICD-10-CM

## 2021-01-29 DIAGNOSIS — M79674 Pain in right toe(s): Secondary | ICD-10-CM | POA: Diagnosis not present

## 2021-01-29 DIAGNOSIS — M79675 Pain in left toe(s): Secondary | ICD-10-CM

## 2021-01-29 DIAGNOSIS — E1151 Type 2 diabetes mellitus with diabetic peripheral angiopathy without gangrene: Secondary | ICD-10-CM

## 2021-01-29 NOTE — Patient Instructions (Signed)
[facility] [employer] [phone])

## 2021-02-01 DIAGNOSIS — E1122 Type 2 diabetes mellitus with diabetic chronic kidney disease: Secondary | ICD-10-CM | POA: Diagnosis not present

## 2021-02-01 DIAGNOSIS — E1151 Type 2 diabetes mellitus with diabetic peripheral angiopathy without gangrene: Secondary | ICD-10-CM | POA: Diagnosis not present

## 2021-02-01 DIAGNOSIS — I129 Hypertensive chronic kidney disease with stage 1 through stage 4 chronic kidney disease, or unspecified chronic kidney disease: Secondary | ICD-10-CM | POA: Diagnosis not present

## 2021-02-01 DIAGNOSIS — M48061 Spinal stenosis, lumbar region without neurogenic claudication: Secondary | ICD-10-CM | POA: Diagnosis not present

## 2021-02-01 DIAGNOSIS — E1142 Type 2 diabetes mellitus with diabetic polyneuropathy: Secondary | ICD-10-CM | POA: Diagnosis not present

## 2021-02-01 DIAGNOSIS — N1831 Chronic kidney disease, stage 3a: Secondary | ICD-10-CM | POA: Diagnosis not present

## 2021-02-04 NOTE — Progress Notes (Signed)
Subjective: [name] presents today for follow up of at risk foot care. Pt has h/o NIDDM with PAD and painful mycotic nails b/l that are difficult to trim. Pain interferes with ambulation. Aggravating factors include wearing enclosed shoe gear. Pain is relieved with periodic professional debridement.   Wife is accompanying him on today's visit. She is requesting information on Podiatry services that will provide care in their home as it is becoming more difficult to get him to his appointments.  PCP is Dr. Lendon Ka. Carlynn Herald., and last visit was January, 2022.  Allergies  Allergen Reactions  . Atorvastatin     Other reaction(s): myalgias  . Other     Other reaction(s): vomiting Other reaction(s): Unknown Other reaction(s): myalgias and confusion on 20mg  1/2 daily  . Rosuvastatin   . Simvastatin     Other reaction(s): myalgias  . Sulfonamide Derivatives Hives     Objective: There were no vitals filed for this visit.  Pt 86 y.o. year old male  in NAD. AAO x 3.   Vascular Examination:  Capillary fill time to digits <3 seconds b/l. Palpable DP pulses b/l. Faintly palpable PT pulses b/l. Pedal hair absent b/l Skin temperature gradient warm to cool b/l.  Dermatological Examination: Pedal skin is thin shiny, atrophic bilaterally. No open wounds bilaterally. No interdigital macerations bilaterally. Toenails 1-5 b/l elongated, dystrophic, thickened, crumbly with subungual debris and tenderness to dorsal palpation.  Musculoskeletal: Normal muscle strength 5/5 to all lower extremity muscle groups bilaterally. No pain crepitus or joint limitation noted with ROM b/l. No gross bony deformities bilaterally. Utilizes wheelchair for mobility assistance.  Neurological: Protective sensation intact 5/5 intact bilaterally with 10g monofilament b/l. Vibratory sensation intact b/l.  Assessment: 1. Pain due to onychomycosis of toenails of both feet   2. Type II diabetes mellitus with peripheral  circulatory disorder (HCC)    Plan: -No new findings. No new orders. -Continue diabetic foot care principles. -Toenails 1-5 b/l were debrided in length and girth with sterile nail nippers and dremel without iatrogenic bleeding.  -Patient to report any pedal injuries to medical professional immediately. -Provided information to patient/POA on [facility] to provide Podiatry services in-home/facility-. -Patient/POA to call should there be question/concern in the interim.  Return in about 6 months (around 07/31/2021).  Marzetta Board, DPM

## 2021-02-07 ENCOUNTER — Other Ambulatory Visit: Payer: Self-pay | Admitting: Neurology

## 2021-03-01 ENCOUNTER — Ambulatory Visit: Payer: Medicare Other | Admitting: Neurology

## 2021-04-29 DIAGNOSIS — E1122 Type 2 diabetes mellitus with diabetic chronic kidney disease: Secondary | ICD-10-CM | POA: Diagnosis not present

## 2021-04-29 DIAGNOSIS — N1831 Chronic kidney disease, stage 3a: Secondary | ICD-10-CM | POA: Diagnosis not present

## 2021-04-29 DIAGNOSIS — I129 Hypertensive chronic kidney disease with stage 1 through stage 4 chronic kidney disease, or unspecified chronic kidney disease: Secondary | ICD-10-CM | POA: Diagnosis not present

## 2021-04-29 DIAGNOSIS — E785 Hyperlipidemia, unspecified: Secondary | ICD-10-CM | POA: Diagnosis not present

## 2021-07-09 ENCOUNTER — Emergency Department (HOSPITAL_COMMUNITY): Payer: Medicare Other

## 2021-07-09 ENCOUNTER — Encounter (HOSPITAL_COMMUNITY): Payer: Self-pay | Admitting: Radiology

## 2021-07-09 ENCOUNTER — Inpatient Hospital Stay (HOSPITAL_COMMUNITY)
Admission: EM | Admit: 2021-07-09 | Discharge: 2021-07-13 | DRG: 193 | Disposition: A | Discharge status: Nursing Home (Includes ICF) | Payer: Medicare Other | Attending: Internal Medicine | Admitting: Internal Medicine

## 2021-07-09 DIAGNOSIS — Z20822 Contact with and (suspected) exposure to covid-19: Secondary | ICD-10-CM | POA: Diagnosis present

## 2021-07-09 DIAGNOSIS — G9389 Other specified disorders of brain: Secondary | ICD-10-CM | POA: Diagnosis not present

## 2021-07-09 DIAGNOSIS — Z7902 Long term (current) use of antithrombotics/antiplatelets: Secondary | ICD-10-CM | POA: Diagnosis not present

## 2021-07-09 DIAGNOSIS — I517 Cardiomegaly: Secondary | ICD-10-CM | POA: Diagnosis not present

## 2021-07-09 DIAGNOSIS — Z95820 Peripheral vascular angioplasty status with implants and grafts: Secondary | ICD-10-CM

## 2021-07-09 DIAGNOSIS — Z8349 Family history of other endocrine, nutritional and metabolic diseases: Secondary | ICD-10-CM

## 2021-07-09 DIAGNOSIS — Z79899 Other long term (current) drug therapy: Secondary | ICD-10-CM

## 2021-07-09 DIAGNOSIS — N1832 Chronic kidney disease, stage 3b: Secondary | ICD-10-CM | POA: Diagnosis present

## 2021-07-09 DIAGNOSIS — I129 Hypertensive chronic kidney disease with stage 1 through stage 4 chronic kidney disease, or unspecified chronic kidney disease: Secondary | ICD-10-CM | POA: Diagnosis present

## 2021-07-09 DIAGNOSIS — J189 Pneumonia, unspecified organism: Secondary | ICD-10-CM | POA: Diagnosis not present

## 2021-07-09 DIAGNOSIS — K4090 Unilateral inguinal hernia, without obstruction or gangrene, not specified as recurrent: Secondary | ICD-10-CM | POA: Diagnosis not present

## 2021-07-09 DIAGNOSIS — E785 Hyperlipidemia, unspecified: Secondary | ICD-10-CM | POA: Diagnosis present

## 2021-07-09 DIAGNOSIS — I70203 Unspecified atherosclerosis of native arteries of extremities, bilateral legs: Secondary | ICD-10-CM | POA: Diagnosis present

## 2021-07-09 DIAGNOSIS — G9341 Metabolic encephalopathy: Secondary | ICD-10-CM | POA: Diagnosis present

## 2021-07-09 DIAGNOSIS — M1990 Unspecified osteoarthritis, unspecified site: Secondary | ICD-10-CM | POA: Diagnosis present

## 2021-07-09 DIAGNOSIS — Z800 Family history of malignant neoplasm of digestive organs: Secondary | ICD-10-CM

## 2021-07-09 DIAGNOSIS — Z6824 Body mass index (BMI) 24.0-24.9, adult: Secondary | ICD-10-CM

## 2021-07-09 DIAGNOSIS — K219 Gastro-esophageal reflux disease without esophagitis: Secondary | ICD-10-CM | POA: Diagnosis present

## 2021-07-09 DIAGNOSIS — Z8249 Family history of ischemic heart disease and other diseases of the circulatory system: Secondary | ICD-10-CM | POA: Diagnosis not present

## 2021-07-09 DIAGNOSIS — I2510 Atherosclerotic heart disease of native coronary artery without angina pectoris: Secondary | ICD-10-CM | POA: Diagnosis present

## 2021-07-09 DIAGNOSIS — N1831 Chronic kidney disease, stage 3a: Secondary | ICD-10-CM | POA: Diagnosis not present

## 2021-07-09 DIAGNOSIS — Z7401 Bed confinement status: Secondary | ICD-10-CM | POA: Diagnosis not present

## 2021-07-09 DIAGNOSIS — N179 Acute kidney failure, unspecified: Secondary | ICD-10-CM | POA: Diagnosis present

## 2021-07-09 DIAGNOSIS — I959 Hypotension, unspecified: Secondary | ICD-10-CM | POA: Diagnosis not present

## 2021-07-09 DIAGNOSIS — R531 Weakness: Secondary | ICD-10-CM

## 2021-07-09 DIAGNOSIS — J181 Lobar pneumonia, unspecified organism: Secondary | ICD-10-CM | POA: Diagnosis present

## 2021-07-09 DIAGNOSIS — F0390 Unspecified dementia without behavioral disturbance: Secondary | ICD-10-CM | POA: Diagnosis present

## 2021-07-09 DIAGNOSIS — Z833 Family history of diabetes mellitus: Secondary | ICD-10-CM

## 2021-07-09 DIAGNOSIS — Z951 Presence of aortocoronary bypass graft: Secondary | ICD-10-CM

## 2021-07-09 DIAGNOSIS — I739 Peripheral vascular disease, unspecified: Secondary | ICD-10-CM | POA: Diagnosis not present

## 2021-07-09 DIAGNOSIS — F17210 Nicotine dependence, cigarettes, uncomplicated: Secondary | ICD-10-CM | POA: Diagnosis present

## 2021-07-09 DIAGNOSIS — E871 Hypo-osmolality and hyponatremia: Secondary | ICD-10-CM | POA: Diagnosis present

## 2021-07-09 DIAGNOSIS — I10 Essential (primary) hypertension: Secondary | ICD-10-CM | POA: Diagnosis present

## 2021-07-09 DIAGNOSIS — R627 Adult failure to thrive: Secondary | ICD-10-CM | POA: Diagnosis present

## 2021-07-09 DIAGNOSIS — Z7984 Long term (current) use of oral hypoglycemic drugs: Secondary | ICD-10-CM

## 2021-07-09 DIAGNOSIS — G459 Transient cerebral ischemic attack, unspecified: Secondary | ICD-10-CM | POA: Diagnosis not present

## 2021-07-09 DIAGNOSIS — E1122 Type 2 diabetes mellitus with diabetic chronic kidney disease: Secondary | ICD-10-CM | POA: Diagnosis present

## 2021-07-09 DIAGNOSIS — R112 Nausea with vomiting, unspecified: Secondary | ICD-10-CM | POA: Diagnosis not present

## 2021-07-09 DIAGNOSIS — Z888 Allergy status to other drugs, medicaments and biological substances status: Secondary | ICD-10-CM

## 2021-07-09 DIAGNOSIS — Z806 Family history of leukemia: Secondary | ICD-10-CM | POA: Diagnosis not present

## 2021-07-09 DIAGNOSIS — Z66 Do not resuscitate: Secondary | ICD-10-CM | POA: Diagnosis present

## 2021-07-09 DIAGNOSIS — M6281 Muscle weakness (generalized): Secondary | ICD-10-CM | POA: Diagnosis present

## 2021-07-09 DIAGNOSIS — E1151 Type 2 diabetes mellitus with diabetic peripheral angiopathy without gangrene: Secondary | ICD-10-CM | POA: Diagnosis present

## 2021-07-09 DIAGNOSIS — R4182 Altered mental status, unspecified: Secondary | ICD-10-CM | POA: Diagnosis not present

## 2021-07-09 DIAGNOSIS — Z87442 Personal history of urinary calculi: Secondary | ICD-10-CM

## 2021-07-09 DIAGNOSIS — Z882 Allergy status to sulfonamides status: Secondary | ICD-10-CM

## 2021-07-09 DIAGNOSIS — N200 Calculus of kidney: Secondary | ICD-10-CM | POA: Diagnosis not present

## 2021-07-09 DIAGNOSIS — R410 Disorientation, unspecified: Secondary | ICD-10-CM | POA: Diagnosis not present

## 2021-07-09 LAB — CBC WITH DIFFERENTIAL/PLATELET
Abs Immature Granulocytes: 0.04 10*3/uL (ref 0.00–0.07)
Basophils Absolute: 0.1 10*3/uL (ref 0.0–0.1)
Basophils Relative: 1 %
Eosinophils Absolute: 0.2 10*3/uL (ref 0.0–0.5)
Eosinophils Relative: 2 %
HCT: 42 % (ref 39.0–52.0)
Hemoglobin: 14.2 g/dL (ref 13.0–17.0)
Immature Granulocytes: 1 %
Lymphocytes Relative: 27 %
Lymphs Abs: 2.3 10*3/uL (ref 0.7–4.0)
MCH: 30.1 pg (ref 26.0–34.0)
MCHC: 33.8 g/dL (ref 30.0–36.0)
MCV: 89.2 fL (ref 80.0–100.0)
Monocytes Absolute: 0.5 10*3/uL (ref 0.1–1.0)
Monocytes Relative: 6 %
Neutro Abs: 5.3 10*3/uL (ref 1.7–7.7)
Neutrophils Relative %: 63 %
Platelets: 161 10*3/uL (ref 150–400)
RBC: 4.71 MIL/uL (ref 4.22–5.81)
RDW: 12.8 % (ref 11.5–15.5)
WBC: 8.4 10*3/uL (ref 4.0–10.5)
nRBC: 0 % (ref 0.0–0.2)

## 2021-07-09 LAB — URINALYSIS, ROUTINE W REFLEX MICROSCOPIC
Bilirubin Urine: NEGATIVE
Glucose, UA: NEGATIVE mg/dL
Hgb urine dipstick: NEGATIVE
Ketones, ur: NEGATIVE mg/dL
Leukocytes,Ua: NEGATIVE
Nitrite: NEGATIVE
Protein, ur: NEGATIVE mg/dL
Specific Gravity, Urine: 1.01 (ref 1.005–1.030)
pH: 6 (ref 5.0–8.0)

## 2021-07-09 LAB — RESP PANEL BY RT-PCR (FLU A&B, COVID) ARPGX2
Influenza A by PCR: NEGATIVE
Influenza B by PCR: NEGATIVE
SARS Coronavirus 2 by RT PCR: NEGATIVE

## 2021-07-09 LAB — COMPREHENSIVE METABOLIC PANEL
ALT: 14 U/L (ref 0–44)
AST: 22 U/L (ref 15–41)
Albumin: 4.4 g/dL (ref 3.5–5.0)
Alkaline Phosphatase: 68 U/L (ref 38–126)
Anion gap: 11 (ref 5–15)
BUN: 35 mg/dL — ABNORMAL HIGH (ref 8–23)
CO2: 25 mmol/L (ref 22–32)
Calcium: 9.3 mg/dL (ref 8.9–10.3)
Chloride: 94 mmol/L — ABNORMAL LOW (ref 98–111)
Creatinine, Ser: 1.79 mg/dL — ABNORMAL HIGH (ref 0.61–1.24)
GFR, Estimated: 37 mL/min — ABNORMAL LOW (ref >60)
Glucose, Bld: 101 mg/dL — ABNORMAL HIGH (ref 70–99)
Potassium: 5 mmol/L (ref 3.5–5.1)
Sodium: 130 mmol/L — ABNORMAL LOW (ref 135–145)
Total Bilirubin: 1 mg/dL (ref 0.3–1.2)
Total Protein: 7.6 g/dL (ref 6.5–8.1)

## 2021-07-09 LAB — TROPONIN I (HIGH SENSITIVITY): Troponin I (High Sensitivity): 8 ng/L (ref <18)

## 2021-07-09 LAB — TSH: TSH: 2.793 u[IU]/mL (ref 0.350–4.500)

## 2021-07-09 LAB — BRAIN NATRIURETIC PEPTIDE: B Natriuretic Peptide: 88.9 pg/mL (ref 0.0–100.0)

## 2021-07-09 MED ORDER — SODIUM CHLORIDE 0.9 % IV SOLN
500.0000 mg | Freq: Once | INTRAVENOUS | Status: AC
  Administered 2021-07-09: 500 mg via INTRAVENOUS
  Filled 2021-07-09: qty 500

## 2021-07-09 MED ORDER — SODIUM CHLORIDE 0.9 % IV SOLN
1.0000 g | Freq: Once | INTRAVENOUS | Status: AC
  Administered 2021-07-09: 1 g via INTRAVENOUS
  Filled 2021-07-09: qty 10

## 2021-07-09 MED ORDER — SODIUM CHLORIDE 0.9 % IV SOLN: Freq: Once | INTRAVENOUS | Status: AC

## 2021-07-09 NOTE — ED Notes (Signed)
Pt wife reports pt coming from their town home with main c/o generalized weakness x 3 days. Usually able to get up and ambulate with walker and not able to get out of the bed now.

## 2021-07-09 NOTE — ED Triage Notes (Signed)
Ems brings pt in from home for generalized weakness for the past 3 days. Family states pt is unable to walk due to bilateral leg weakness.

## 2021-07-09 NOTE — ED Provider Notes (Addendum)
New weakness for 3 days. Wife cannot assist with mobility. No fall, no known trauma. Physical Exam  BP (!) 132/92 (BP Location: Left Arm)   Pulse (!) 48   Temp 97.8 F (36.6 C) (Oral)   Resp 14   Ht '6\' 2"'$  (1.88 m)   Wt 87.1 kg   SpO2 100%   BMI 24.65 kg/m   Physical Exam  ED Course/Procedures     Procedures  MDM  Probable admission. Patient rehydrated.  Continues to be weak.  Can sit up but not ambulate. Chest x-ray shows suspicious area for possible developing pneumonia.  Reviewed with Dr. Cyd Silence for admission.  Will opt to treat with Rocephin and Zithromax and admit for further evaluation and management.       Charlesetta Shanks, MD 07/09/21 1533    Charlesetta Shanks, MD 07/09/21 2221

## 2021-07-09 NOTE — ED Notes (Signed)
ED TO INPATIENT HANDOFF REPORT  Name/Age/Gender [name] 86 y.o. male  Code Status Code Status History     Date Active Date Inactive Code Status Order ID Comments User Context   03/23/2015 1505 03/24/2015 1421 Full Code [id number]  Ansel Bong Inpatient   03/11/2015 1513 03/11/2015 2313 Full Code [id number]  Rosetta Posner, MD Inpatient   02/16/2015 1736 02/17/2015 1434 Full Code [id number]  Kelvin Cellar, MD Inpatient      Questions for Most Recent Historical Code Status (Order [id number])        Home/SNF/Other Home  Chief Complaint Pneumonia of left lower lobe due to infectious organism [J18.9]  Level of Care/Admitting Diagnosis ED Disposition     ED Disposition  Admit   Condition  --   [place]: [place] [[id number]]  Level of Care: Telemetry [5]  Admit to tele based on following criteria: Monitor for Ischemic changes  May admit patient to [facility] or [facility] if equivalent level of care is available:: No  Covid Evaluation: Asymptomatic Screening Protocol (No Symptoms)  Diagnosis: Pneumonia of left lower lobe due to infectious organism [id number]  Admitting Physician: Vernelle Emerald [id number]  Attending Physician: Vernelle Emerald [id number]  Estimated length of stay: 3 - 4 days  Certification:: I certify this patient will need inpatient services for at least 2 midnights          Medical History Past Medical History:  Diagnosis Date   Arthritis    CAD (coronary artery disease)    Cataract    Diabetes mellitus    GERD (gastroesophageal reflux disease)    Helicobacter pylori gastritis 01/2006, 03/2011   EGD - Pylera Tx 2010   Hiccups    History of kidney stones    Hyperlipidemia    Hypertension    PAD (peripheral artery disease) (HCC)     Allergies Allergies  Allergen Reactions   Atorvastatin Other (See Comments)    Muscle pain   Rosuvastatin Other (See Comments)    Muscle pain   Simvastatin  Other (See Comments)    Muscle pain   Sulfonamide Derivatives Hives    IV Location/Drains/Wounds Patient Lines/Drains/Airways Status     Active Line/Drains/Airways     Name Placement date Placement time Site Days   Peripheral IV 07/09/21 22 G Left Forearm 07/09/21  --  Forearm  less than 1   Peripheral IV 07/09/21 22 G Right Hand 07/09/21  1502  Hand  less than 1   Incision (Closed) 03/23/15 Groin Bilateral 03/23/15  1106  -- 2300            Labs/Imaging Results for orders placed or performed during the hospital encounter of 07/09/21 (from the past 48 hour(s))  CBC with Differential/Platelet     Status: None   Collection Time: 07/09/21  3:10 PM  Result Value Ref Range   WBC 8.4 4.0 - 10.5 K/uL   RBC 4.71 4.22 - 5.81 MIL/uL   Hemoglobin 14.2 13.0 - 17.0 g/dL   HCT 42.0 39.0 - 52.0 %   MCV 89.2 80.0 - 100.0 fL   MCH 30.1 26.0 - 34.0 pg   MCHC 33.8 30.0 - 36.0 g/dL   RDW 12.8 11.5 - 15.5 %   Platelets 161 150 - 400 K/uL   nRBC 0.0 0.0 - 0.2 %   Neutrophils Relative % 63 %   Neutro Abs 5.3 1.7 - 7.7 K/uL   Lymphocytes Relative 27 %  Lymphs Abs 2.3 0.7 - 4.0 K/uL   Monocytes Relative 6 %   Monocytes Absolute 0.5 0.1 - 1.0 K/uL   Eosinophils Relative 2 %   Eosinophils Absolute 0.2 0.0 - 0.5 K/uL   Basophils Relative 1 %   Basophils Absolute 0.1 0.0 - 0.1 K/uL   Immature Granulocytes 1 %   Abs Immature Granulocytes 0.04 0.00 - 0.07 K/uL    Comment: Performed at [facility], [place] [address], [place], [place] [address]  Comprehensive metabolic panel     Status: Abnormal   Collection Time: 07/09/21  3:10 PM  Result Value Ref Range   Sodium 130 (L) 135 - 145 mmol/L   Potassium 5.0 3.5 - 5.1 mmol/L   Chloride 94 (L) 98 - 111 mmol/L   CO2 25 22 - 32 mmol/L   Glucose, Bld 101 (H) 70 - 99 mg/dL    Comment: Glucose reference range applies only to samples taken after fasting for at least 8 hours.   BUN 35 (H) 8 - 23 mg/dL   Creatinine, Ser 1.79 (H) 0.61 -  1.24 mg/dL   Calcium 9.3 8.9 - 10.3 mg/dL   Total Protein 7.6 6.5 - 8.1 g/dL   Albumin 4.4 3.5 - 5.0 g/dL   AST 22 15 - 41 U/L   ALT 14 0 - 44 U/L   Alkaline Phosphatase 68 38 - 126 U/L   Total Bilirubin 1.0 0.3 - 1.2 mg/dL   GFR, Estimated 37 (L) >60 mL/min    Comment: (NOTE) Calculated using the CKD-EPI Creatinine Equation (2021)    Anion gap 11 5 - 15    Comment: Performed at [facility], [place] [address], [place], [place] [address]  Troponin I (High Sensitivity)     Status: None   Collection Time: 07/09/21  3:10 PM  Result Value Ref Range   Troponin I (High Sensitivity) 8 <18 ng/L    Comment: (NOTE) Elevated high sensitivity troponin I (hsTnI) values and significant  changes across serial measurements may suggest ACS but many other  chronic and acute conditions are known to elevate hsTnI results.  Refer to the "Links" section for chest pain algorithms and additional  guidance. Performed at [facility], [place] [address] [place], [place] [address]   Brain natriuretic peptide     Status: None   Collection Time: 07/09/21  3:10 PM  Result Value Ref Range   B Natriuretic Peptide 88.9 0.0 - 100.0 pg/mL    Comment: Performed at [facility], [place] [address] [place], [place] [address]  TSH     Status: None   Collection Time: 07/09/21  3:10 PM  Result Value Ref Range   TSH 2.793 0.350 - 4.500 uIU/mL    Comment: Performed by a 3rd Generation assay with a functional sensitivity of <=0.01 uIU/mL. Performed at [facility], [place] [address] [place], [place] [address]   Resp Panel by RT-PCR (Flu A&B, Covid) Nasopharyngeal Swab     Status: None   Collection Time: 07/09/21  3:59 PM   Specimen: Nasopharyngeal Swab; Nasopharyngeal(NP) swabs in vial transport medium  Result Value Ref Range   SARS Coronavirus 2 by RT PCR NEGATIVE NEGATIVE    Comment: (NOTE) SARS-CoV-2 target nucleic acids are NOT DETECTED.  The  SARS-CoV-2 RNA is generally detectable in upper respiratory specimens during the acute phase of infection. The lowest concentration of SARS-CoV-2 viral copies this assay can detect is 138 copies/mL. A negative result  does not preclude SARS-Cov-2 infection and should not be used as the sole basis for treatment or other patient management decisions. A negative result may occur with  improper specimen collection/handling, submission of specimen other than nasopharyngeal swab, presence of viral mutation(s) within the areas targeted by this assay, and inadequate number of viral copies(<138 copies/mL). A negative result must be combined with clinical observations, patient history, and epidemiological information. The expected result is Negative.  Fact Sheet for Patients:  EntrepreneurPulse.com.au  Fact Sheet for Healthcare Providers:  IncredibleEmployment.be  This test is no t yet approved or cleared by the [place] FDA and  has been authorized for detection and/or diagnosis of SARS-CoV-2 by FDA under an Emergency Use Authorization (EUA). This EUA will remain  in effect (meaning this test can be used) for the duration of the COVID-19 declaration under Section 564(b)(1) of the Act, 21 U.S.C.section 360bbb-3(b)(1), unless the authorization is terminated  or revoked sooner.       Influenza A by PCR NEGATIVE NEGATIVE   Influenza B by PCR NEGATIVE NEGATIVE    Comment: (NOTE) The Xpert Xpress SARS-CoV-2/FLU/RSV plus assay is intended as an aid in the diagnosis of influenza from Nasopharyngeal swab specimens and should not be used as a sole basis for treatment. Nasal washings and aspirates are unacceptable for Xpert Xpress SARS-CoV-2/FLU/RSV testing.  Fact Sheet for Patients: EntrepreneurPulse.com.au  Fact Sheet for Healthcare Providers: IncredibleEmployment.be  This test is not yet approved or cleared by the  [place] FDA and has been authorized for detection and/or diagnosis of SARS-CoV-2 by FDA under an Emergency Use Authorization (EUA). This EUA will remain in effect (meaning this test can be used) for the duration of the COVID-19 declaration under Section 564(b)(1) of the Act, 21 U.S.C. section 360bbb-3(b)(1), unless the authorization is terminated or revoked.  Performed at [facility], [place] [address] [place], [place] [address]   Urinalysis, Routine w reflex microscopic Urine, Clean Catch     Status: None   Collection Time: 07/09/21  9:45 PM  Result Value Ref Range   Color, Urine YELLOW YELLOW   APPearance CLEAR CLEAR   Specific Gravity, Urine 1.010 1.005 - 1.030   pH 6.0 5.0 - 8.0   Glucose, UA NEGATIVE NEGATIVE mg/dL   Hgb urine dipstick NEGATIVE NEGATIVE   Bilirubin Urine NEGATIVE NEGATIVE   Ketones, ur NEGATIVE NEGATIVE mg/dL   Protein, ur NEGATIVE NEGATIVE mg/dL   Nitrite NEGATIVE NEGATIVE   Leukocytes,Ua NEGATIVE NEGATIVE    Comment: Microscopic not done on urines with negative protein, blood, leukocytes, nitrite, or glucose < 500 mg/dL. Performed at [facility], [place] [address] [place], [place] [address]    CT ABDOMEN PELVIS WO CONTRAST  Result Date: 07/09/2021 CLINICAL DATA:  Nausea, vomiting, weakness EXAM: CT ABDOMEN AND PELVIS WITHOUT CONTRAST TECHNIQUE: Multidetector CT imaging of the abdomen and pelvis was performed following the standard protocol without IV contrast. IV contrast not administered due to impaired renal function. Sagittal and coronal MPR images reconstructed from axial data set. COMPARISON:  06/15/2012 FINDINGS: Lower chest: Emphysematous and bronchitic changes at lung bases. Interstitial lung disease changes LEFT lower lobe, minimally RIGHT base, progressive since 2013. Thickening at base of LEFT major fissure. Hepatobiliary: Gallbladder and liver normal appearance Pancreas: Atrophic without mass Spleen: Normal  appearance Adrenals/Urinary Tract: Small nonobstructing calculus at inferior pole LEFT kidney. Adrenal glands, kidneys, ureters, and bladder otherwise normal appearance. Stomach/Bowel: Normal appendix, retrocecal. Increased stool throughout colon and rectum. Stomach and small bowel  loops normal appearance. No evidence of bowel obstruction or wall thickening. Vascular/Lymphatic: Extensive atherosclerotic calcifications aorta, iliac arteries, femoral arteries, visceral arteries, and coronary arteries. Fusiform aneurysmal dilatation mid abdominal aorta 4.1 x 3.2 cm previously 3.8 x 3.0 cm. Suspected high-grade narrowings of the common iliac arteries bilaterally. Stent identified RIGHT external iliac artery, suspect LEFT external iliac artery as well. No adenopathy. Reproductive: Minimal prostatic enlargement. Seminal vesicles unremarkable. Other: Small LEFT inguinal hernia containing fat. No free air or free fluid. No inflammatory process. Musculoskeletal: Osseous demineralization with degenerative disc and facet disease changes lumbar spine. BILATERAL spondylolysis L4 with grade 2 spondylolisthesis L4-L5. IMPRESSION: Increased stool throughout colon and rectum. Small LEFT inguinal hernia containing fat. Extensive atherosclerotic disease changes with fusiform aneurysmal dilatation of mid abdominal aorta 4.1 x 3.2 cm; recommend follow-up every 12 months and vascular consultation. This recommendation follows ACR consensus guidelines: White Paper of the ACR Incidental Findings Committee II on Vascular Findings. J Am Coll Radiol 2013; 10:789-794. Suspected high-grade stenoses of the common iliac arteries bilaterally. Small nonobstructing LEFT renal calculus. BILATERAL spondylolysis L4 with grade 2 spondylolisthesis L4-L5. Emphysematous changes with progressive interstitial lung disease at lung bases particularly on LEFT. Emphysema (ICD10-J43.9). Aortic Atherosclerosis (ICD10-I70.0). Aortic aneurysm NOS (ICD10-I71.9).  Electronically Signed   By: Lavonia Dana M.D.   On: 07/09/2021 19:01   CT HEAD WO CONTRAST (5MM)  Result Date: 07/09/2021 CLINICAL DATA:  [place] EXAM: CT HEAD WITHOUT CONTRAST TECHNIQUE: Contiguous axial images were obtained from the base of the skull through the vertex without intravenous contrast. COMPARISON:  Brain MRA 03/23/2020, brain MRI 02/11/2020 FINDINGS: Image quality is degraded by motion artifact. Brain: There is no evidence of acute intracranial hemorrhage, extra-axial fluid collection, or infarct. There is global parenchymal volume loss with commensurate enlargement of the ventricular system, similar to the prior MRI. There is confluent hypodensity in the subcortical and periventricular white matter likely reflecting sequela of advanced chronic white matter microangiopathy. There is no mass lesion. There is no midline shift. Vascular: There is calcification of the bilateral cavernous ICAs. Skull: Normal. Negative for definite fracture or focal lesion. Sinuses/Orbits: The imaged paranasal sinuses are clear. Bilateral lens implants are in place. The globes and orbits are otherwise unremarkable. Other: None. IMPRESSION: 1. No acute intracranial pathology, within the confines of motion degraded images. 2. Overall unchanged parenchymal volume loss and chronic white matter microangiopathy. 3 Electronically Signed   By: Valetta Mole M.D.   On: 07/09/2021 15:17   DG Chest Portable 1 View  Result Date: 07/09/2021 CLINICAL DATA:  Weakness, generalized weakness for 3 days, unable to walk due to bilateral leg weakness. EXAM: PORTABLE CHEST 1 VIEW COMPARISON:  CT of the chest abdomen and pelvis from 2016. FINDINGS: Post median sternotomy for CABG.  EKG leads project over the chest. Cardiomediastinal contours with mild cardiac enlargement, accentuated by portable technique and low lung volumes. No lobar consolidative process. There is however patchy opacities overlying the LEFT hemidiaphragm and LEFT heart  border and increased density in the retrocardiac region. No pneumothorax. On limited assessment there is no acute skeletal process. IMPRESSION: Findings suspicious for developing LEFT lower lobe pneumonia. Post median sternotomy for CABG. Electronically Signed   By: Zetta Bills M.D.   On: 07/09/2021 14:20    Pending Labs Unresulted Labs (From admission, onward)     Start     Ordered   07/09/21 1341  CBC with Differential  ONCE - STAT,   STAT        07/09/21  1341            Vitals/Pain Today's Vitals   07/09/21 1927 07/09/21 2030 07/09/21 2058 07/09/21 2206  BP: (!) 145/64 130/60 125/90 (!) 134/118  Pulse: 63 65 (!) 52 (!) 55  Resp: '18 18 19 19  '$ Temp:      TempSrc:      SpO2: 100% 99% 99% 98%  Weight:      Height:      PainSc:        Isolation Precautions Airborne and Contact precautions  Medications Medications  cefTRIAXone (ROCEPHIN) 1 g in sodium chloride 0.9 % 100 mL IVPB (1 g Intravenous New Bag/Given 07/09/21 2301)  azithromycin (ZITHROMAX) 500 mg in sodium chloride 0.9 % 250 mL IVPB (has no administration in time range)  0.9 %  sodium chloride infusion ( Intravenous New Bag/Given 07/09/21 1827)    Mobility walks with device normally. Nonambulatory for last 3 days.

## 2021-07-09 NOTE — ED Provider Notes (Signed)
[facility] DEPT Provider Note   CSN: [id number] Arrival date & time: 07/09/21  1152     History Chief Complaint  Patient presents with   Weakness    [name] is a 86 y.o. male.  Patient is an 86 yo male presenting with wife for generalized weakness. Patient's wife states he in normally able to walk with a walker at home but has had progressive generalized weakness over the last three days resulting in inability to ambulate on his own requiring two assistants to get him up to the bathroom or out to the car. Wife states she lives alone with him, has scheduled nursing home placement for next week, but can't even get him to the car for his TB test for clearance. Denies any chest or sob. Denies fevers, chills, coughing. Admits to two episodes of nausea and vomiting yesterday. Otherwise no sick contacts. No urinary complaints.   The history is provided by the patient and the spouse. No language interpreter was used.  Weakness Associated symptoms: nausea and vomiting   Associated symptoms: no abdominal pain, no arthralgias, no chest pain, no cough, no dysuria, no fever, no seizures and no shortness of breath       Past Medical History:  Diagnosis Date   Arthritis    CAD (coronary artery disease)    Cataract    Diabetes mellitus    GERD (gastroesophageal reflux disease)    Helicobacter pylori gastritis 01/2006, 03/2011   EGD - Pylera Tx 2010   Hiccups    History of kidney stones    Hyperlipidemia    Hypertension    PAD (peripheral artery disease) ([place])     Patient Active Problem List   Diagnosis Date Noted   Acute metabolic encephalopathy 99991111   Mixed diabetic hyperlipidemia associated with type 2 diabetes mellitus ([place]) 07/10/2021   GERD without esophagitis 07/10/2021   Chronic kidney disease, stage 3a ([place]) 07/10/2021   Dementia without behavioral disturbance ([place]) 07/10/2021   Coronary artery disease involving native heart without  angina pectoris 07/10/2021   Pneumonia of left lower lobe due to infectious organism 07/09/2021   Laceration of left index finger without foreign body without damage to nail 08/24/2017   Type 2 diabetes mellitus with stage 3a chronic kidney disease, without long-term current use of insulin ([place]) 11/04/2015   Left carotid bruit 11/04/2015   PVD with claudication - s/p PV surgery May 2016 03/23/2015   Intractable hiccups 02/16/2015   Dehydration 02/16/2015   Hyponatremia 02/16/2015   Perineal abscess 06/20/2013   Essential hypertension    Hx of CABG 2009    Hyperlipidemia    Chronic constipation 06/27/2012    Past Surgical History:  Procedure Laterality Date   CATARACT EXTRACTION     right   COLONOSCOPY  2002, 2007, 06/09/2011   Dr. Evelina Bucy - [name], [place]: for FHx colon cancer, no adenomas; 2012: [facility] - normal   CORONARY ARTERY BYPASS GRAFT  05-07-2008   ENDARTERECTOMY FEMORAL Bilateral 03/23/2015   Procedure: ENDARTERECTOMY OF BILATERAL COMMON FEMORAL AND PROFUNDA ARTERIES;  Surgeon: Rosetta Posner, MD;  Location: [facility];  Service: Vascular;  Laterality: Bilateral;   ESOPHAGOGASTRODUODENOSCOPY  02/20/06   Dr. Evelina Bucy, [name], [place]. pylori gastritis and GERD changes (pathology)   EYE SURGERY Left    cataract   HERNIA REPAIR  12/24/2010   INSERTION OF ILIAC STENT Bilateral 03/23/2015   Procedure: LEFT EXTERNAL ILIAC AND BILATERAL COMMON ILIAC STENT;  Surgeon: Rosetta Posner, MD;  Location: [facility];  Service: Vascular;  Laterality: Bilateral;   JOINT REPLACEMENT     right knee replacement   PATCH ANGIOPLASTY Bilateral 03/23/2015   Procedure: PATCH ANGIOPLASTY OF BILATERAL COMMON FEMORAL ARTERIES;  Surgeon: Rosetta Posner, MD;  Location: [facility];  Service: Vascular;  Laterality: Bilateral;   PERIPHERAL VASCULAR CATHETERIZATION N/A 03/11/2015   Procedure: Abdominal Aortogram w/Lower Extremity;  Surgeon: Rosetta Posner, MD;  Location: [facility];  Service: Cardiovascular;  Laterality: N/A;    TOTAL KNEE ARTHROPLASTY  07-13-2012   right   UPPER GASTROINTESTINAL ENDOSCOPY  03/09/2009   Dr. Carlean Purl: H. pylori gastritis (Pylera Tx) and 2 cm hiatal hernia       Family History  Problem Relation Age of Onset   Heart disease Father    Diabetes Father    Heart attack Father    Hypertension Father    Varicose Veins Father    Cancer Sister        colon   Diabetes Sister    Other Sister        varicose veins   Heart attack Sister    Heart disease Sister    Hyperlipidemia Sister    Hypertension Sister    Varicose Veins Sister    Cancer Brother        prostate   Diabetes Brother    Heart disease Brother    Hyperlipidemia Brother    Hypertension Brother    Other Brother        varicose veins   Heart attack Brother    Varicose Veins Brother    Cancer Sister        lukemia   Myasthenia gravis Sister    Colon cancer Sister    Leukemia Sister    Heart attack Brother    Crohn's disease Son     Social History   Tobacco Use   Smoking status: Every Day    Packs/day: 1.00    Years: 40.00    Pack years: 40.00    Types: Cigarettes   Smokeless tobacco: Never  Vaping Use   Vaping Use: Never used  Substance Use Topics   Alcohol use: Yes    Alcohol/week: 0.0 standard drinks    Comment: 1 time per month   Drug use: No    Home Medications Prior to Admission medications   Medication Sig Start Date End Date Taking? Authorizing Provider  ALEVE 220 MG tablet Take 220-440 mg by mouth 2 (two) times daily as needed (for pain).   Yes [provider]  amoxicillin (AMOXIL) 500 MG capsule Take 2,000 mg by mouth See admin instructions. Take 2,000 mg by mouth 30 minutes prior to dental appointments 07/24/18  Yes [provider]  clopidogrel (PLAVIX) 75 MG tablet Take 75 mg by mouth daily with supper.   Yes [provider]  metFORMIN (GLUCOPHAGE) 1000 MG tablet Take 1,500 mg by mouth daily with supper. 02/16/11  Yes [provider]  metoprolol  succinate (TOPROL-XL) 25 MG 24 hr tablet Take 25 mg by mouth daily with supper.   Yes [provider]  pantoprazole (PROTONIX) 40 MG tablet Take 40 mg by mouth daily before supper. 04/14/20  Yes [provider]  ramipril (ALTACE) 5 MG capsule Take 5 mg by mouth daily with supper.   Yes [provider]  glucose blood (ONETOUCH VERIO) test strip Check sugars daily Dx: E11.49 09/11/18   [provider]  Misc. Devices [facility]) MISC Dx: Muscle Weakness 11/27/19  [provider]  OneTouch Delica Lancets 99991111 MISC Pt to check BS daily  Dx: E11.49 02/24/20   [provider]    Allergies    Atorvastatin, Rosuvastatin, Simvastatin, and Sulfonamide derivatives  Review of Systems   Review of Systems  Constitutional:  Negative for chills and fever.  HENT:  Negative for ear pain and sore throat.   Eyes:  Negative for pain and visual disturbance.  Respiratory:  Negative for cough and shortness of breath.   Cardiovascular:  Negative for chest pain and palpitations.  Gastrointestinal:  Positive for nausea and vomiting. Negative for abdominal pain.  Genitourinary:  Negative for dysuria and hematuria.  Musculoskeletal:  Negative for arthralgias and back pain.  Skin:  Negative for color change and rash.  Neurological:  Positive for weakness. Negative for seizures and syncope.  All other systems reviewed and are negative.  Physical Exam Updated Vital Signs BP 113/73 (BP Location: Right Arm)   Pulse 78   Temp 97.8 F (36.6 C) (Axillary)   Resp 17   Ht '6\' 2"'$  (1.88 m)   Wt 87.1 kg   SpO2 94%   BMI 24.65 kg/m   Physical Exam Vitals and nursing note reviewed.  Constitutional:      Appearance: He is well-developed.  HENT:     Head: Normocephalic and atraumatic.  Eyes:     Conjunctiva/sclera: Conjunctivae normal.  Cardiovascular:     Rate and Rhythm: Normal rate and regular rhythm.     Heart sounds: No murmur heard. Pulmonary:     Effort:  Pulmonary effort is normal. No respiratory distress.     Breath sounds: Normal breath sounds.  Abdominal:     Palpations: Abdomen is soft.     Tenderness: There is no abdominal tenderness.  Musculoskeletal:     Cervical back: Neck supple.  Skin:    General: Skin is warm and dry.  Neurological:     Mental Status: He is alert.     GCS: GCS eye subscore is 4. GCS verbal subscore is 5. GCS motor subscore is 6.     Sensory: Sensation is intact.     Motor: Motor function is intact.     Gait: Gait abnormal.    ED Results / Procedures / Treatments   Labs (all labs ordered are listed, but only abnormal results are displayed) Labs Reviewed  COMPREHENSIVE METABOLIC PANEL - Abnormal; Notable for the following components:      Result Value   Sodium 130 (*)    Chloride 94 (*)    Glucose, Bld 101 (*)    BUN 35 (*)    Creatinine, Ser 1.79 (*)    GFR, Estimated 37 (*)    All other components within normal limits  HEMOGLOBIN A1C - Abnormal; Notable for the following components:   Hgb A1c MFr Bld 6.3 (*)    All other components within normal limits  COMPREHENSIVE METABOLIC PANEL - Abnormal; Notable for the following components:   Sodium 130 (*)    Chloride 95 (*)    Glucose, Bld 104 (*)    BUN 31 (*)    Creatinine, Ser 1.54 (*)    Calcium 8.7 (*)    GFR, Estimated 44 (*)    All other components within normal limits  CBC WITH DIFFERENTIAL/PLATELET - Abnormal; Notable for the following components:   RBC 3.95 (*)    Hemoglobin 11.8 (*)    HCT 34.3 (*)    All other components within normal limits  RESP  PANEL BY RT-PCR (FLU A&B, COVID) ARPGX2  CULTURE, BLOOD (ROUTINE X 2)  CULTURE, BLOOD (ROUTINE X 2)  EXPECTORATED SPUTUM ASSESSMENT W GRAM STAIN, RFLX TO RESP C  URINALYSIS, ROUTINE W REFLEX MICROSCOPIC  CBC WITH DIFFERENTIAL/PLATELET  BRAIN NATRIURETIC PEPTIDE  TSH  C-REACTIVE PROTEIN  PROCALCITONIN  MAGNESIUM  GLUCOSE, CAPILLARY  CBC WITH DIFFERENTIAL/PLATELET  AMMONIA  VITAMIN  B12  FOLATE  SODIUM, URINE, RANDOM  STREP PNEUMONIAE URINARY ANTIGEN  OSMOLALITY, URINE  TROPONIN I (HIGH SENSITIVITY)  TROPONIN I (HIGH SENSITIVITY)    EKG None  Radiology CT ABDOMEN PELVIS WO CONTRAST  Result Date: 07/09/2021 CLINICAL DATA:  Nausea, vomiting, weakness EXAM: CT ABDOMEN AND PELVIS WITHOUT CONTRAST TECHNIQUE: Multidetector CT imaging of the abdomen and pelvis was performed following the standard protocol without IV contrast. IV contrast not administered due to impaired renal function. Sagittal and coronal MPR images reconstructed from axial data set. COMPARISON:  06/15/2012 FINDINGS: Lower chest: Emphysematous and bronchitic changes at lung bases. Interstitial lung disease changes LEFT lower lobe, minimally RIGHT base, progressive since 2013. Thickening at base of LEFT major fissure. Hepatobiliary: Gallbladder and liver normal appearance Pancreas: Atrophic without mass Spleen: Normal appearance Adrenals/Urinary Tract: Small nonobstructing calculus at inferior pole LEFT kidney. Adrenal glands, kidneys, ureters, and bladder otherwise normal appearance. Stomach/Bowel: Normal appendix, retrocecal. Increased stool throughout colon and rectum. Stomach and small bowel loops normal appearance. No evidence of bowel obstruction or wall thickening. Vascular/Lymphatic: Extensive atherosclerotic calcifications aorta, iliac arteries, femoral arteries, visceral arteries, and coronary arteries. Fusiform aneurysmal dilatation mid abdominal aorta 4.1 x 3.2 cm previously 3.8 x 3.0 cm. Suspected high-grade narrowings of the common iliac arteries bilaterally. Stent identified RIGHT external iliac artery, suspect LEFT external iliac artery as well. No adenopathy. Reproductive: Minimal prostatic enlargement. Seminal vesicles unremarkable. Other: Small LEFT inguinal hernia containing fat. No free air or free fluid. No inflammatory process. Musculoskeletal: Osseous demineralization with degenerative disc and  facet disease changes lumbar spine. BILATERAL spondylolysis L4 with grade 2 spondylolisthesis L4-L5. IMPRESSION: Increased stool throughout colon and rectum. Small LEFT inguinal hernia containing fat. Extensive atherosclerotic disease changes with fusiform aneurysmal dilatation of mid abdominal aorta 4.1 x 3.2 cm; recommend follow-up every 12 months and vascular consultation. This recommendation follows ACR consensus guidelines: White Paper of the ACR Incidental Findings Committee II on Vascular Findings. J Am Coll Radiol 2013; 10:789-794. Suspected high-grade stenoses of the common iliac arteries bilaterally. Small nonobstructing LEFT renal calculus. BILATERAL spondylolysis L4 with grade 2 spondylolisthesis L4-L5. Emphysematous changes with progressive interstitial lung disease at lung bases particularly on LEFT. Emphysema (ICD10-J43.9). Aortic Atherosclerosis (ICD10-I70.0). Aortic aneurysm NOS (ICD10-I71.9). Electronically Signed   By: Lavonia Dana M.D.   On: 07/09/2021 19:01   CT HEAD WO CONTRAST (5MM)  Result Date: 07/09/2021 CLINICAL DATA:  [place]irium EXAM: CT HEAD WITHOUT CONTRAST TECHNIQUE: Contiguous axial images were obtained from the base of the skull through the vertex without intravenous contrast. COMPARISON:  Brain MRA 03/23/2020, brain MRI 02/11/2020 FINDINGS: Image quality is degraded by motion artifact. Brain: There is no evidence of acute intracranial hemorrhage, extra-axial fluid collection, or infarct. There is global parenchymal volume loss with commensurate enlargement of the ventricular system, similar to the prior MRI. There is confluent hypodensity in the subcortical and periventricular white matter likely reflecting sequela of advanced chronic white matter microangiopathy. There is no mass lesion. There is no midline shift. Vascular: There is calcification of the bilateral cavernous ICAs. Skull: Normal. Negative for definite fracture or focal lesion. Sinuses/Orbits: The imaged paranasal  sinuses are clear. Bilateral lens implants are in place. The globes and orbits are otherwise unremarkable. Other: None. IMPRESSION: 1. No acute intracranial pathology, within the confines of motion degraded images. 2. Overall unchanged parenchymal volume loss and chronic white matter microangiopathy. 3 Electronically Signed   By: Valetta Mole M.D.   On: 07/09/2021 15:17   DG Chest Portable 1 View  Result Date: 07/09/2021 CLINICAL DATA:  Weakness, generalized weakness for 3 days, unable to walk due to bilateral leg weakness. EXAM: PORTABLE CHEST 1 VIEW COMPARISON:  CT of the chest abdomen and pelvis from 2016. FINDINGS: Post median sternotomy for CABG.  EKG leads project over the chest. Cardiomediastinal contours with mild cardiac enlargement, accentuated by portable technique and low lung volumes. No lobar consolidative process. There is however patchy opacities overlying the LEFT hemidiaphragm and LEFT heart border and increased density in the retrocardiac region. No pneumothorax. On limited assessment there is no acute skeletal process. IMPRESSION: Findings suspicious for developing LEFT lower lobe pneumonia. Post median sternotomy for CABG. Electronically Signed   By: Zetta Bills M.D.   On: 07/09/2021 14:20    Procedures Procedures   Medications Ordered in ED Medications  metoprolol succinate (TOPROL-XL) 24 hr tablet 25 mg (has no administration in time range)  pantoprazole (PROTONIX) EC tablet 40 mg (has no administration in time range)  clopidogrel (PLAVIX) tablet 75 mg (has no administration in time range)  insulin aspart (novoLOG) injection 0-15 Units (0 Units Subcutaneous Not Given 07/10/21 0841)  cefTRIAXone (ROCEPHIN) 2 g in sodium chloride 0.9 % 100 mL IVPB (has no administration in time range)  azithromycin (ZITHROMAX) 500 mg in sodium chloride 0.9 % 250 mL IVPB (has no administration in time range)  enoxaparin (LOVENOX) injection 40 mg (has no administration in time range)   acetaminophen (TYLENOL) tablet 650 mg (has no administration in time range)    Or  acetaminophen (TYLENOL) suppository 650 mg (has no administration in time range)  ondansetron (ZOFRAN) tablet 4 mg (has no administration in time range)    Or  ondansetron (ZOFRAN) injection 4 mg (has no administration in time range)  polyethylene glycol (MIRALAX / GLYCOLAX) packet 17 g (has no administration in time range)  0.9 %  sodium chloride infusion ( Intravenous New Bag/Given 07/10/21 0156)  0.9 %  sodium chloride infusion ( Intravenous New Bag/Given 07/09/21 1827)  cefTRIAXone (ROCEPHIN) 1 g in sodium chloride 0.9 % 100 mL IVPB (0 g Intravenous Stopped 07/09/21 2332)  azithromycin (ZITHROMAX) 500 mg in sodium chloride 0.9 % 250 mL IVPB (500 mg Intravenous New Bag/Given 07/09/21 2334)    ED Course  I have reviewed the triage vital signs and the nursing notes.  Pertinent labs & imaging results that were available during my care of the patient were reviewed by me and considered in my medical decision making (see chart for details).    MDM Rules/Calculators/A&P  9:27 AM 86 yo male presenting with wife for generalized weakness. Patient is alert but confused on exam. Soft abdomen. No acute findings. No current signs or symptoms of sepsis. Pending laboratory workup.   AMS/generalized weakness: -CT head demonstrates no acute process -EKG, cardiovascular workup pending -Hgb pending. Denies hx of bleeding -TSH pending -Hx of nausea/vomiting yesterday. Ct abdomen pending. -UA pending   Patient signed out to oncoming physician while awaiting workup results.       Final Clinical Impression(s) / ED Diagnoses   Rx / DC Orders ED Discharge Orders     None  Campbell Stall P, DO [id number] [phone]

## 2021-07-09 NOTE — ED Notes (Signed)
This pt was able to sit at the edge of the bed but was unable to stand up. Nurse and provider are aware.

## 2021-07-09 NOTE — ED Notes (Signed)
Pt linens changed. Repositioned. Condom catheter placed with clean brief.

## 2021-07-10 ENCOUNTER — Encounter (HOSPITAL_COMMUNITY): Payer: Self-pay | Admitting: Internal Medicine

## 2021-07-10 ENCOUNTER — Other Ambulatory Visit: Payer: Self-pay

## 2021-07-10 DIAGNOSIS — K219 Gastro-esophageal reflux disease without esophagitis: Secondary | ICD-10-CM | POA: Diagnosis present

## 2021-07-10 DIAGNOSIS — G9341 Metabolic encephalopathy: Secondary | ICD-10-CM | POA: Diagnosis present

## 2021-07-10 DIAGNOSIS — E782 Mixed hyperlipidemia: Secondary | ICD-10-CM | POA: Insufficient documentation

## 2021-07-10 DIAGNOSIS — I2510 Atherosclerotic heart disease of native coronary artery without angina pectoris: Secondary | ICD-10-CM | POA: Diagnosis present

## 2021-07-10 DIAGNOSIS — N1831 Chronic kidney disease, stage 3a: Secondary | ICD-10-CM | POA: Diagnosis present

## 2021-07-10 DIAGNOSIS — E1169 Type 2 diabetes mellitus with other specified complication: Secondary | ICD-10-CM | POA: Insufficient documentation

## 2021-07-10 DIAGNOSIS — F0390 Unspecified dementia without behavioral disturbance: Secondary | ICD-10-CM | POA: Diagnosis present

## 2021-07-10 LAB — COMPREHENSIVE METABOLIC PANEL
ALT: 12 U/L (ref 0–44)
AST: 19 U/L (ref 15–41)
Albumin: 3.5 g/dL (ref 3.5–5.0)
Alkaline Phosphatase: 59 U/L (ref 38–126)
Anion gap: 11 (ref 5–15)
BUN: 31 mg/dL — ABNORMAL HIGH (ref 8–23)
CO2: 24 mmol/L (ref 22–32)
Calcium: 8.7 mg/dL — ABNORMAL LOW (ref 8.9–10.3)
Chloride: 95 mmol/L — ABNORMAL LOW (ref 98–111)
Creatinine, Ser: 1.54 mg/dL — ABNORMAL HIGH (ref 0.61–1.24)
GFR, Estimated: 44 mL/min — ABNORMAL LOW (ref >60)
Glucose, Bld: 104 mg/dL — ABNORMAL HIGH (ref 70–99)
Potassium: 4.6 mmol/L (ref 3.5–5.1)
Sodium: 130 mmol/L — ABNORMAL LOW (ref 135–145)
Total Bilirubin: 0.8 mg/dL (ref 0.3–1.2)
Total Protein: 6.6 g/dL (ref 6.5–8.1)

## 2021-07-10 LAB — CBC WITH DIFFERENTIAL/PLATELET
Abs Immature Granulocytes: 0.04 10*3/uL (ref 0.00–0.07)
Basophils Absolute: 0.1 10*3/uL (ref 0.0–0.1)
Basophils Relative: 1 %
Eosinophils Absolute: 0.3 10*3/uL (ref 0.0–0.5)
Eosinophils Relative: 3 %
HCT: 34.3 % — ABNORMAL LOW (ref 39.0–52.0)
Hemoglobin: 11.8 g/dL — ABNORMAL LOW (ref 13.0–17.0)
Immature Granulocytes: 1 %
Lymphocytes Relative: 30 %
Lymphs Abs: 2.4 10*3/uL (ref 0.7–4.0)
MCH: 29.9 pg (ref 26.0–34.0)
MCHC: 34.4 g/dL (ref 30.0–36.0)
MCV: 86.8 fL (ref 80.0–100.0)
Monocytes Absolute: 0.6 10*3/uL (ref 0.1–1.0)
Monocytes Relative: 8 %
Neutro Abs: 4.7 10*3/uL (ref 1.7–7.7)
Neutrophils Relative %: 57 %
Platelets: 168 10*3/uL (ref 150–400)
RBC: 3.95 MIL/uL — ABNORMAL LOW (ref 4.22–5.81)
RDW: 12.8 % (ref 11.5–15.5)
WBC: 8.1 10*3/uL (ref 4.0–10.5)
nRBC: 0 % (ref 0.0–0.2)

## 2021-07-10 LAB — GLUCOSE, CAPILLARY
Glucose-Capillary: 96 mg/dL (ref 70–99)
Glucose-Capillary: 151 mg/dL — ABNORMAL HIGH (ref 70–99)
Glucose-Capillary: 65 mg/dL — ABNORMAL LOW (ref 70–99)
Glucose-Capillary: 93 mg/dL (ref 70–99)
Glucose-Capillary: 157 mg/dL — ABNORMAL HIGH (ref 70–99)

## 2021-07-10 LAB — STREP PNEUMONIAE URINARY ANTIGEN: Strep Pneumo Urinary Antigen: NEGATIVE

## 2021-07-10 LAB — MAGNESIUM: Magnesium: 1.7 mg/dL (ref 1.7–2.4)

## 2021-07-10 LAB — TROPONIN I (HIGH SENSITIVITY): Troponin I (High Sensitivity): 9 ng/L (ref <18)

## 2021-07-10 LAB — PROCALCITONIN: Procalcitonin: <0.1 ng/mL

## 2021-07-10 LAB — SODIUM, URINE, RANDOM: Sodium, Ur: 98 mmol/L

## 2021-07-10 LAB — AMMONIA: Ammonia: 21 umol/L (ref 9–35)

## 2021-07-10 LAB — VITAMIN B12: Vitamin B-12: 295 pg/mL (ref 180–914)

## 2021-07-10 LAB — OSMOLALITY, URINE: Osmolality, Ur: 329 mOsm/kg (ref 300–900)

## 2021-07-10 LAB — HEMOGLOBIN A1C
Hgb A1c MFr Bld: 6.3 % — ABNORMAL HIGH (ref 4.8–5.6)
Mean Plasma Glucose: 134.11 mg/dL

## 2021-07-10 LAB — FOLATE: Folate: 17.1 ng/mL (ref >5.9)

## 2021-07-10 LAB — C-REACTIVE PROTEIN: CRP: <0.5 mg/dL (ref <1.0)

## 2021-07-10 MED ORDER — ACETAMINOPHEN 325 MG PO TABS: 650.0000 mg | ORAL_TABLET | Freq: Four times a day (QID) | ORAL | Status: DC | PRN

## 2021-07-10 MED ORDER — ACETAMINOPHEN 650 MG RE SUPP: 650.0000 mg | Freq: Four times a day (QID) | RECTAL | Status: DC | PRN

## 2021-07-10 MED ORDER — SODIUM CHLORIDE 0.9 % IV SOLN
500.0000 mg | INTRAVENOUS | Status: DC
  Administered 2021-07-10 – 2021-07-12 (×3): 500 mg via INTRAVENOUS
  Filled 2021-07-10 (×3): qty 500

## 2021-07-10 MED ORDER — SODIUM CHLORIDE 0.9 % IV SOLN: INTRAVENOUS | Status: AC

## 2021-07-10 MED ORDER — RAMIPRIL 5 MG PO CAPS: 5.0000 mg | ORAL_CAPSULE | Freq: Every day | ORAL | Status: DC

## 2021-07-10 MED ORDER — ENOXAPARIN SODIUM 40 MG/0.4ML IJ SOSY
40.0000 mg | PREFILLED_SYRINGE | INTRAMUSCULAR | Status: DC
  Administered 2021-07-10 – 2021-07-13 (×3): 40 mg via SUBCUTANEOUS
  Filled 2021-07-10 (×4): qty 0.4

## 2021-07-10 MED ORDER — CLOPIDOGREL BISULFATE 75 MG PO TABS
75.0000 mg | ORAL_TABLET | Freq: Every day | ORAL | Status: DC
  Administered 2021-07-10 – 2021-07-13 (×4): 75 mg via ORAL
  Filled 2021-07-10 (×4): qty 1

## 2021-07-10 MED ORDER — TUBERCULIN PPD 5 UNIT/0.1ML ID SOLN
5.0000 [IU] | Freq: Once | INTRADERMAL | Status: AC
  Administered 2021-07-10: 5 [IU] via INTRADERMAL
  Filled 2021-07-10 (×2): qty 0.1

## 2021-07-10 MED ORDER — ONDANSETRON HCL 4 MG PO TABS
4.0000 mg | ORAL_TABLET | Freq: Four times a day (QID) | ORAL | Status: DC | PRN
  Filled 2021-07-10: qty 1

## 2021-07-10 MED ORDER — INSULIN ASPART 100 UNIT/ML IJ SOLN
0.0000 [IU] | Freq: Three times a day (TID) | INTRAMUSCULAR | Status: DC
  Administered 2021-07-10 – 2021-07-12 (×6): 3 [IU] via SUBCUTANEOUS
  Administered 2021-07-12 – 2021-07-13 (×2): 2 [IU] via SUBCUTANEOUS
  Administered 2021-07-13: 3 [IU] via SUBCUTANEOUS

## 2021-07-10 MED ORDER — ONDANSETRON HCL 4 MG/2ML IJ SOLN
4.0000 mg | Freq: Four times a day (QID) | INTRAMUSCULAR | Status: DC | PRN
  Administered 2021-07-11: 4 mg via INTRAVENOUS
  Filled 2021-07-10: qty 2

## 2021-07-10 MED ORDER — SODIUM CHLORIDE 0.9 % IV SOLN
2.0000 g | INTRAVENOUS | Status: DC
  Administered 2021-07-10 – 2021-07-12 (×3): 2 g via INTRAVENOUS
  Filled 2021-07-10: qty 2
  Filled 2021-07-10 (×2): qty 20

## 2021-07-10 MED ORDER — PANTOPRAZOLE SODIUM 40 MG PO TBEC
40.0000 mg | DELAYED_RELEASE_TABLET | Freq: Every day | ORAL | Status: DC
  Administered 2021-07-10 – 2021-07-13 (×4): 40 mg via ORAL
  Filled 2021-07-10 (×4): qty 1

## 2021-07-10 MED ORDER — POLYETHYLENE GLYCOL 3350 17 G PO PACK: 17.0000 g | PACK | Freq: Every day | ORAL | Status: DC | PRN

## 2021-07-10 MED ORDER — METOPROLOL SUCCINATE ER 25 MG PO TB24
25.0000 mg | ORAL_TABLET | Freq: Every day | ORAL | Status: DC
  Administered 2021-07-10 – 2021-07-13 (×4): 25 mg via ORAL
  Filled 2021-07-10 (×4): qty 1

## 2021-07-10 NOTE — Progress Notes (Signed)
PROGRESS NOTE    Stephen ARDIS  [id number] DOB: 1935-05-25 DOA: 07/09/2021 PCP: Ginger Organ., MD    Chief Complaint  Patient presents with   Weakness    Brief Narrative:  History of non-insulin-dependent type diabetes,, CAD status post CABG, PAD status post bilateral lower extremity arterial angioplasty and stenting in 2016, CKD 3B presented to the hospital due to weakness, found to have lobar pneumonia and hyponatremia  Subjective:  He is calm and cooperative currently, he is only oriented to person, wife states he is more confused than normal, he is a lot weaker than normal Wife reports him being strangle and chocking at times  Wife requested PPD be placed to facilitate ALF placement  Assessment & Plan:   Principal Problem:   Acute metabolic encephalopathy Active Problems:   Essential hypertension   Type 2 diabetes mellitus with stage 3a chronic kidney disease, without long-term current use of insulin (HCC)   Pneumonia of left lower lobe due to infectious organism   GERD without esophagitis   Chronic kidney disease, stage 3a ([place])   Dementia without behavioral disturbance ([place])   Coronary artery disease involving native heart without angina pectoris    Acute metabolic encephalopathy/failure to thrive -ct head no acute findings, showed "Overall unchanged parenchymal volume loss and chronic white matter microangiopathy" -Confusion and weakness likely due to pneumonia -Delirium protocol, family will stay with patient as well -Will get PT eval, likely will need skilled nursing facility placement  Lobar pneumonia Chest x-ray with left lower lobe pneumonia, blood culture in process Will check urine strep pneumo antigen Is started on Rocephin and Zithromax in the ED, continue Wife reports him strangle /chocking at times, he vomited twice in the last two weeks But appetite is good  Will have speech see him  Hyponatremia Will check serum osmolality ,urine sodium  and urine osmole TSH within normal limit Will check a.m. cortisol level  Non-insulin-dependent type 2 diabetes, controlled  A1c 6.3 Hold home medication metformin Started on SSI here  Hypertension Continue metoprolol, hold ramipril for now  CAD, h/o CABG in 2009 Denies chest pain Continue metoprolol, Plavix I do not see statin on home med list  PAD, status post bilateral lower extremity arterial angioplasty and stenting in 2016 Continue Plavix I do not see statin on home med list  CKD 3B Creatinine appears close to baseline UA unremarkable  The patient's BMI is: Body mass index is 24.65 kg/m.Marland Kitchen      Unresulted Labs (From admission, onward)     Start     Ordered   07/11/21 0500  Osmolality  Tomorrow morning,   R        07/10/21 0856   07/11/21 0500  Cortisol  Tomorrow morning,   R        07/10/21 0856   07/11/21 [id number]  Basic metabolic panel  Daily,   R      07/10/21 0859   07/11/21 0500  Magnesium  Tomorrow morning,   R        07/10/21 0859   07/10/21 0856  Sodium, urine, random  ONCE - STAT,   STAT        07/10/21 0856   07/10/21 0856  Strep pneumoniae urinary antigen  Once,   R        07/10/21 0856   07/10/21 0856  Osmolality, urine  Once,   R        07/10/21 0856   07/10/21 0047  Culture, blood (  routine x 2) Call MD if unable to obtain prior to antibiotics being given  (COPD / Pneumonia / Cellulitis / Lower Extremity Wound)  BLOOD CULTURE X 2,   R     Comments: If blood cultures drawn in Emergency Department - Do not draw and cancel order    07/10/21 0050   07/10/21 0047  Expectorated Sputum Assessment w Gram Stain, Rflx to Resp Cult  (COPD / Pneumonia / Cellulitis / Lower Extremity Wound)  Once,   R        07/10/21 0050   07/09/21 1341  CBC with Differential  ONCE - STAT,   STAT        07/09/21 1341              DVT prophylaxis: enoxaparin (LOVENOX) injection 40 mg Start: 07/10/21 1000   Code Status: DNR Family Communication: Wife at  bedside Disposition:   Status is: Inpatient   Dispo: The patient is from: Home              Anticipated d/c is to: Likely will need skilled nursing facility placement              Anticipated d/c date is: Likely Monday                Consultants:  None  Procedures:  None  Antimicrobials:    Anti-infectives (From admission, onward)    Start     Dose/Rate Route Frequency Ordered Stop   07/10/21 2230  cefTRIAXone (ROCEPHIN) 2 g in sodium chloride 0.9 % 100 mL IVPB        2 g 200 mL/hr over 30 Minutes Intravenous Every 24 hours 07/10/21 0050 07/15/21 2229   07/10/21 2230  azithromycin (ZITHROMAX) 500 mg in sodium chloride 0.9 % 250 mL IVPB        500 mg 250 mL/hr over 60 Minutes Intravenous Every 24 hours 07/10/21 0050 07/15/21 2229   07/09/21 2230  cefTRIAXone (ROCEPHIN) 1 g in sodium chloride 0.9 % 100 mL IVPB        1 g 200 mL/hr over 30 Minutes Intravenous  Once 07/09/21 2218 07/09/21 2332   07/09/21 2230  azithromycin (ZITHROMAX) 500 mg in sodium chloride 0.9 % 250 mL IVPB        500 mg 250 mL/hr over 60 Minutes Intravenous  Once 07/09/21 2218 07/10/21 0034          Objective: Vitals:   07/10/21 0118 07/10/21 0416 07/10/21 0815 07/10/21 1215  BP: 124/69 (!) 120/57 113/73 (!) 149/84  Pulse: 63 71 78 (!) 58  Resp: '16 18 17 16  '$ Temp: 97.6 F (36.4 C) 97.9 F (36.6 C) 97.8 F (36.6 C) 97.9 F (36.6 C)  TempSrc: Oral Oral Axillary Axillary  SpO2: 97% 96% 94% 96%  Weight:      Height:        Intake/Output Summary (Last 24 hours) at 07/10/2021 1724 Last data filed at 07/10/2021 0500 Gross per 24 hour  Intake 305.23 ml  Output --  Net 305.23 ml   Filed Weights   07/09/21 1202  Weight: 87.1 kg    Examination:  General exam: Frail, demented elderly only oriented x1 to person , but calm and cooperative Respiratory system: Diminished, no wheezing, no rales, no rhonchi, respiratory effort normal. Cardiovascular system: S1 & S2 heard, RRR.  Gastrointestinal  system: Abdomen is nondistended, soft and nontender. Normal bowel sounds heard. Central nervous system: Alert and orientedx1. No focal neurological deficits. Extremities:  no edema Skin: No rashes, lesions or ulcers Psychiatry: Demented, calm and cooperative    Data Reviewed: I have personally reviewed following labs and imaging studies  CBC: Recent Labs  Lab 07/09/21 1510 07/10/21 0030  WBC 8.4 8.1  NEUTROABS 5.3 4.7  HGB 14.2 11.8*  HCT 42.0 34.3*  MCV 89.2 86.8  PLT 161 [id number]    Basic Metabolic Panel: Recent Labs  Lab 07/09/21 1510 07/10/21 0030  NA 130* 130*  K 5.0 4.6  CL 94* 95*  CO2 25 24  GLUCOSE 101* 104*  BUN 35* 31*  CREATININE 1.79* 1.54*  CALCIUM 9.3 8.7*  MG  --  1.7    GFR: Estimated Creatinine Clearance: 41.5 mL/min (A) (by C-G formula based on SCr of 1.54 mg/dL (H)).  Liver Function Tests: Recent Labs  Lab 07/09/21 1510 07/10/21 0030  AST 22 19  ALT 14 12  ALKPHOS 68 59  BILITOT 1.0 0.8  PROT 7.6 6.6  ALBUMIN 4.4 3.5    CBG: Recent Labs  Lab 07/10/21 0807 07/10/21 1239 07/10/21 1655  GLUCAP 96 151* 65*     Recent Results (from the past 240 hour(s))  Resp Panel by RT-PCR (Flu A&B, Covid) Nasopharyngeal Swab     Status: None   Collection Time: 07/09/21  3:59 PM   Specimen: Nasopharyngeal Swab; Nasopharyngeal(NP) swabs in vial transport medium  Result Value Ref Range Status   SARS Coronavirus 2 by RT PCR NEGATIVE NEGATIVE Final    Comment: (NOTE) SARS-CoV-2 target nucleic acids are NOT DETECTED.  The SARS-CoV-2 RNA is generally detectable in upper respiratory specimens during the acute phase of infection. The lowest concentration of SARS-CoV-2 viral copies this assay can detect is 138 copies/mL. A negative result does not preclude SARS-Cov-2 infection and should not be used as the sole basis for treatment or other patient management decisions. A negative result may occur with  improper specimen collection/handling, submission  of specimen other than nasopharyngeal swab, presence of viral mutation(s) within the areas targeted by this assay, and inadequate number of viral copies(<138 copies/mL). A negative result must be combined with clinical observations, patient history, and epidemiological information. The expected result is Negative.  Fact Sheet for Patients:  EntrepreneurPulse.com.au  Fact Sheet for Healthcare Providers:  IncredibleEmployment.be  This test is no t yet approved or cleared by the [place] FDA and  has been authorized for detection and/or diagnosis of SARS-CoV-2 by FDA under an Emergency Use Authorization (EUA). This EUA will remain  in effect (meaning this test can be used) for the duration of the COVID-19 declaration under Section 564(b)(1) of the Act, 21 U.S.C.section 360bbb-3(b)(1), unless the authorization is terminated  or revoked sooner.       Influenza A by PCR NEGATIVE NEGATIVE Final   Influenza B by PCR NEGATIVE NEGATIVE Final    Comment: (NOTE) The Xpert Xpress SARS-CoV-2/FLU/RSV plus assay is intended as an aid in the diagnosis of influenza from Nasopharyngeal swab specimens and should not be used as a sole basis for treatment. Nasal washings and aspirates are unacceptable for Xpert Xpress SARS-CoV-2/FLU/RSV testing.  Fact Sheet for Patients: EntrepreneurPulse.com.au  Fact Sheet for Healthcare Providers: IncredibleEmployment.be  This test is not yet approved or cleared by the [place] FDA and has been authorized for detection and/or diagnosis of SARS-CoV-2 by FDA under an Emergency Use Authorization (EUA). This EUA will remain in effect (meaning this test can be used) for the duration of the COVID-19 declaration under Section 564(b)(1) of the Act, 21  U.S.C. section 360bbb-3(b)(1), unless the authorization is terminated or revoked.  Performed at [facility], [place]  [address] [place], [place] [address]          Radiology Studies: CT ABDOMEN PELVIS WO CONTRAST  Result Date: 07/09/2021 CLINICAL DATA:  Nausea, vomiting, weakness EXAM: CT ABDOMEN AND PELVIS WITHOUT CONTRAST TECHNIQUE: Multidetector CT imaging of the abdomen and pelvis was performed following the standard protocol without IV contrast. IV contrast not administered due to impaired renal function. Sagittal and coronal MPR images reconstructed from axial data set. COMPARISON:  06/15/2012 FINDINGS: Lower chest: Emphysematous and bronchitic changes at lung bases. Interstitial lung disease changes LEFT lower lobe, minimally RIGHT base, progressive since 2013. Thickening at base of LEFT major fissure. Hepatobiliary: Gallbladder and liver normal appearance Pancreas: Atrophic without mass Spleen: Normal appearance Adrenals/Urinary Tract: Small nonobstructing calculus at inferior pole LEFT kidney. Adrenal glands, kidneys, ureters, and bladder otherwise normal appearance. Stomach/Bowel: Normal appendix, retrocecal. Increased stool throughout colon and rectum. Stomach and small bowel loops normal appearance. No evidence of bowel obstruction or wall thickening. Vascular/Lymphatic: Extensive atherosclerotic calcifications aorta, iliac arteries, femoral arteries, visceral arteries, and coronary arteries. Fusiform aneurysmal dilatation mid abdominal aorta 4.1 x 3.2 cm previously 3.8 x 3.0 cm. Suspected high-grade narrowings of the common iliac arteries bilaterally. Stent identified RIGHT external iliac artery, suspect LEFT external iliac artery as well. No adenopathy. Reproductive: Minimal prostatic enlargement. Seminal vesicles unremarkable. Other: Small LEFT inguinal hernia containing fat. No free air or free fluid. No inflammatory process. Musculoskeletal: Osseous demineralization with degenerative disc and facet disease changes lumbar spine. BILATERAL spondylolysis L4 with grade 2 spondylolisthesis L4-L5. IMPRESSION:  Increased stool throughout colon and rectum. Small LEFT inguinal hernia containing fat. Extensive atherosclerotic disease changes with fusiform aneurysmal dilatation of mid abdominal aorta 4.1 x 3.2 cm; recommend follow-up every 12 months and vascular consultation. This recommendation follows ACR consensus guidelines: White Paper of the ACR Incidental Findings Committee II on Vascular Findings. J Am Coll Radiol 2013; 10:789-794. Suspected high-grade stenoses of the common iliac arteries bilaterally. Small nonobstructing LEFT renal calculus. BILATERAL spondylolysis L4 with grade 2 spondylolisthesis L4-L5. Emphysematous changes with progressive interstitial lung disease at lung bases particularly on LEFT. Emphysema (ICD10-J43.9). Aortic Atherosclerosis (ICD10-I70.0). Aortic aneurysm NOS (ICD10-I71.9). Electronically Signed   By: Lavonia Dana M.D.   On: 07/09/2021 19:01   CT HEAD WO CONTRAST (5MM)  Result Date: 07/09/2021 CLINICAL DATA:  Delirium EXAM: CT HEAD WITHOUT CONTRAST TECHNIQUE: Contiguous axial images were obtained from the base of the skull through the vertex without intravenous contrast. COMPARISON:  Brain MRA 03/23/2020, brain MRI 02/11/2020 FINDINGS: Image quality is degraded by motion artifact. Brain: There is no evidence of acute intracranial hemorrhage, extra-axial fluid collection, or infarct. There is global parenchymal volume loss with commensurate enlargement of the ventricular system, similar to the prior MRI. There is confluent hypodensity in the subcortical and periventricular white matter likely reflecting sequela of advanced chronic white matter microangiopathy. There is no mass lesion. There is no midline shift. Vascular: There is calcification of the bilateral cavernous ICAs. Skull: Normal. Negative for definite fracture or focal lesion. Sinuses/Orbits: The imaged paranasal sinuses are clear. Bilateral lens implants are in place. The globes and orbits are otherwise unremarkable. Other:  None. IMPRESSION: 1. No acute intracranial pathology, within the confines of motion degraded images. 2. Overall unchanged parenchymal volume loss and chronic white matter microangiopathy. 3 Electronically Signed   By: Valetta Mole M.D.   On: 07/09/2021 15:17   DG  Chest Portable 1 View  Result Date: 07/09/2021 CLINICAL DATA:  Weakness, generalized weakness for 3 days, unable to walk due to bilateral leg weakness. EXAM: PORTABLE CHEST 1 VIEW COMPARISON:  CT of the chest abdomen and pelvis from 2016. FINDINGS: Post median sternotomy for CABG.  EKG leads project over the chest. Cardiomediastinal contours with mild cardiac enlargement, accentuated by portable technique and low lung volumes. No lobar consolidative process. There is however patchy opacities overlying the LEFT hemidiaphragm and LEFT heart border and increased density in the retrocardiac region. No pneumothorax. On limited assessment there is no acute skeletal process. IMPRESSION: Findings suspicious for developing LEFT lower lobe pneumonia. Post median sternotomy for CABG. Electronically Signed   By: Zetta Bills M.D.   On: 07/09/2021 14:20        Scheduled Meds:  clopidogrel  75 mg Oral Q supper   enoxaparin (LOVENOX) injection  40 mg Subcutaneous Q24H   insulin aspart  0-15 Units Subcutaneous TID AC & HS   metoprolol succinate  25 mg Oral Q supper   pantoprazole  40 mg Oral QAC supper   tuberculin  5 Units Intradermal Once   Continuous Infusions:  sodium chloride 100 mL/hr at 07/10/21 0156   azithromycin     cefTRIAXone (ROCEPHIN)  IV       LOS: 1 day   Time spent: 35 mins Greater than 50% of this time was spent in counseling, explanation of diagnosis, planning of further management, and coordination of care.   Voice Recognition Viviann Spare dictation system was used to create this note, attempts have been made to correct errors. Please contact the author with questions and/or clarifications.   Florencia Reasons, MD PhD FACP [facility]  Available via Epic secure chat 7am-7pm for nonurgent issues Please page for urgent issues To page the attending provider between 7A-7P or the covering provider during after hours 7P-7A, please log into the web site [email or web address] and access using universal [place] password for that web site. If you do not have the password, please call the hospital operator.    07/10/2021, 5:24 PM

## 2021-07-10 NOTE — H&P (Signed)
History and Physical    [name] [id number] DOB: November 29, 1934 DOA: 07/09/2021  PCP: Ginger Organ., MD  Patient coming from: Home via EMS   Chief Complaint:  Chief Complaint  Patient presents with   Weakness     HPI:    86 year old male with past medical history of dementia, peripheral vascular disease, hypertension, gastroesophageal reflux disease, non-insulin-dependent diabetes mellitus type 2, chronic kidney disease stage IIIa, hyperlipidemia, chronic hyponatremia, coronary artery disease status post CABG who presents to [facility] emergency department via EMS due to progressively worsening weakness, lethargy and inability to ambulate.  Patient is an extremely poor historian therefore the majority the history is been obtained from his wife via phone conversation as well as review of triage notes and discussion with emergency department staff.  Family explains that patient is typically able to ambulate with a walker at baseline.  For the past 3 days however the patient has been experiencing rapidly progressive weakness.  This weakness is severe worse with exertion and improved with rest.  Upon further questioning patient has not been experiencing any shortness of breath, cough, fevers, dysuria, pain, diarrhea.  Patient has not undergone any recent travel, has no sick contacts and no contact with confirmed COVID-19 infection.  Patient's weakness got so severe the patient has been unable to ambulate whatsoever and therefore EMS was contacted by the family who rapidly came to evaluate the patient and brought the patient into [facility] long hospital emergency department for further evaluation.  Upon evaluation in the emergency department CT imaging of the head was unremarkable.  Urinalysis was found to be unremarkable.  Chest x-ray was found to identify a left lower lobe infiltrate concerning for pneumonia.  Pneumonia is felt to be the cause of the patient's severe weakness.   While patient is not requiring oxygen ER provider is requesting hospitalization due to profound weakness and inability to ambulate with pneumonia as the suspected etiology.  Patient has been administered a dose of ceftriaxone and azithromycin.  The hospitalist group has now been called to assess the patient for admission the hospital.  Review of Systems:   Review of Systems  Unable to perform ROS: Dementia   Past Medical History:  Diagnosis Date   Arthritis    CAD (coronary artery disease)    Cataract    Diabetes mellitus    GERD (gastroesophageal reflux disease)    Helicobacter pylori gastritis 01/2006, 03/2011   EGD - Pylera Tx 2010   Hiccups    History of kidney stones    Hyperlipidemia    Hypertension    PAD (peripheral artery disease) [facility])     Past Surgical History:  Procedure Laterality Date   CATARACT EXTRACTION     right   COLONOSCOPY  2002, 2007, 06/09/2011   Dr. Evelina Bucy - [name], : for FHx colon cancer, no adenomas; 2012: [facility]ner - normal   CORONARY ARTERY BYPASS GRAFT  05-07-2008   ENDARTERECTOMY FEMORAL Bilateral 03/23/2015   Procedure: ENDARTERECTOMY OF BILATERAL COMMON FEMORAL AND PROFUNDA ARTERIES;  Surgeon: Rosetta Posner, MD;  Location: [place];  Service: Vascular;  Laterality: Bilateral;   ESOPHAGOGASTRODUODENOSCOPY  02/20/06   Dr. Evelina Bucy, [name], [place]. pylori gastritis and GERD changes (pathology)   EYE SURGERY Left    cataract   HERNIA REPAIR  12/24/2010   INSERTION OF ILIAC STENT Bilateral 03/23/2015   Procedure: LEFT EXTERNAL ILIAC AND BILATERAL COMMON ILIAC STENT;  Surgeon: Rosetta Posner, MD;  Location: [place];  Service:  Vascular;  Laterality: Bilateral;   JOINT REPLACEMENT     right knee replacement   PATCH ANGIOPLASTY Bilateral 03/23/2015   Procedure: PATCH ANGIOPLASTY OF BILATERAL COMMON FEMORAL ARTERIES;  Surgeon: Rosetta Posner, MD;  Location: [place];  Service: Vascular;  Laterality: Bilateral;   PERIPHERAL VASCULAR CATHETERIZATION N/A 03/11/2015    Procedure: Abdominal Aortogram w/Lower Extremity;  Surgeon: Rosetta Posner, MD;  Location: [place] [facility];  Service: Cardiovascular;  Laterality: N/A;   TOTAL KNEE ARTHROPLASTY  07-13-2012   right   UPPER GASTROINTESTINAL ENDOSCOPY  03/09/2009   Dr. Carlean Purl: H. pylori gastritis (Pylera Tx) and 2 cm hiatal hernia     reports that he has been smoking cigarettes. He has a 40.00 pack-year smoking history. He has never used smokeless tobacco. He reports current alcohol use. He reports that he does not use drugs.  Allergies  Allergen Reactions   Atorvastatin Other (See Comments)    Muscle pain   Rosuvastatin Other (See Comments)    Muscle pain   Simvastatin Other (See Comments)    Muscle pain   Sulfonamide Derivatives Hives    Family History  Problem Relation Age of Onset   Heart disease Father    Diabetes Father    Heart attack Father    Hypertension Father    Varicose Veins Father    Cancer Sister        colon   Diabetes Sister    Other Sister        varicose veins   Heart attack Sister    Heart disease Sister    Hyperlipidemia Sister    Hypertension Sister    Varicose Veins Sister    Cancer Brother        prostate   Diabetes Brother    Heart disease Brother    Hyperlipidemia Brother    Hypertension Brother    Other Brother        varicose veins   Heart attack Brother    Varicose Veins Brother    Cancer Sister        lukemia   Myasthenia gravis Sister    Colon cancer Sister    Leukemia Sister    Heart attack Brother    Crohn's disease Son      Prior to Admission medications   Medication Sig Start Date End Date Taking? Authorizing Provider  ALEVE 220 MG tablet Take 220-440 mg by mouth 2 (two) times daily as needed (for pain).   Yes [provider]  amoxicillin (AMOXIL) 500 MG capsule Take 2,000 mg by mouth See admin instructions. Take 2,000 mg by mouth 30 minutes prior to dental appointments 07/24/18  Yes [provider]  clopidogrel (PLAVIX)  75 MG tablet Take 75 mg by mouth daily with supper.   Yes [provider]  metFORMIN (GLUCOPHAGE) 1000 MG tablet Take 1,500 mg by mouth daily with supper. 02/16/11  Yes [provider]  metoprolol succinate (TOPROL-XL) 25 MG 24 hr tablet Take 25 mg by mouth daily with supper.   Yes [provider]  pantoprazole (PROTONIX) 40 MG tablet Take 40 mg by mouth daily before supper. 04/14/20  Yes [provider]  ramipril (ALTACE) 5 MG capsule Take 5 mg by mouth daily with supper.   Yes [provider]  glucose blood (ONETOUCH VERIO) test strip Check sugars daily Dx: E11.49 09/11/18   [provider]  Misc. Devices [facility]) MISC Dx: Muscle Weakness 11/27/19   [provider]  Jonetta Speak  Lancets 30G MISC Pt to check BS daily  Dx: E11.49 02/24/20   [provider]    Physical Exam: Vitals:   07/10/21 0016 07/10/21 0118 07/10/21 0416 07/10/21 0815  BP: 131/71 124/69 (!) 120/57 113/73  Pulse: 73 63 71 78  Resp: '20 16 18 17  '$ Temp: (!) 97.4 F (36.3 C) 97.6 F (36.4 C) 97.9 F (36.6 C) 97.8 F (36.6 C)  TempSrc: Oral Oral Oral Axillary  SpO2: 99% 97% 96% 94%  Weight:      Height:        Constitutional: Lethargic but minimally arousable.  Patient is currently not in any acute distress. Skin: no rashes, no lesions, poor skin turgor noted. Eyes: Pupils are equally reactive to light.  No evidence of scleral icterus or conjunctival pallor.  ENMT: Dry mucous membranes noted.  Posterior pharynx clear of any exudate or lesions.   Neck: normal, supple, no masses, no thyromegaly.  No evidence of jugular venous distension.   Respiratory: mild bibasilar rales.  No evidence of significant wheezing normal respiratory effort. No accessory muscle use.  Cardiovascular: Regular rate and rhythm, no murmurs / rubs / gallops. No extremity edema. 2+ pedal pulses. No carotid bruits.  Chest:   Nontender without crepitus or deformity.   Back:    Nontender without crepitus or deformity. Abdomen: Abdomen is soft and nontender.  No evidence of intra-abdominal masses.  Positive bowel sounds noted in all quadrants.   Musculoskeletal: No joint deformity upper and lower extremities. Good ROM, no contractures. Normal muscle tone.  Neurologic: Patient extremely lethargic and disoriented but responsive to both verbal and painful stimuli. Sensation intact.  Patient moving all 4 extremities spontaneously.  Patient is not consistently following commands.   Psychiatric: Unable to assess due to severe lethargy.  Patient currently does not seem to possess insight as to his current situation.   Labs on Admission: I have personally reviewed following labs and imaging studies -   CBC: Recent Labs  Lab 07/09/21 1510 07/10/21 0030  WBC 8.4 8.1  NEUTROABS 5.3 4.7  HGB 14.2 11.8*  HCT 42.0 34.3*  MCV 89.2 86.8  PLT 161 [id number]   Basic Metabolic Panel: Recent Labs  Lab 07/09/21 1510 07/10/21 0030  NA 130* 130*  K 5.0 4.6  CL 94* 95*  CO2 25 24  GLUCOSE 101* 104*  BUN 35* 31*  CREATININE 1.79* 1.54*  CALCIUM 9.3 8.7*  MG  --  1.7   GFR: Estimated Creatinine Clearance: 41.5 mL/min (A) (by C-G formula based on SCr of 1.54 mg/dL (H)). Liver Function Tests: Recent Labs  Lab 07/09/21 1510 07/10/21 0030  AST 22 19  ALT 14 12  ALKPHOS 68 59  BILITOT 1.0 0.8  PROT 7.6 6.6  ALBUMIN 4.4 3.5   No results for input(s): LIPASE, AMYLASE in the last 168 hours. No results for input(s): AMMONIA in the last 168 hours. Coagulation Profile: No results for input(s): INR, PROTIME in the last 168 hours. Cardiac Enzymes: No results for input(s): CKTOTAL, CKMB, CKMBINDEX, TROPONINI in the last 168 hours. BNP (last 3 results) No results for input(s): PROBNP in the last 8760 hours. HbA1C: Recent Labs    07/10/21 0030  HGBA1C 6.3*   CBG: Recent Labs  Lab 07/10/21 0807  GLUCAP 96   Lipid Profile: No results for input(s): CHOL, HDL, LDLCALC,  TRIG, CHOLHDL, LDLDIRECT in the last 72 hours. Thyroid Function Tests: Recent Labs    07/09/21 1510  TSH 2.793   Anemia Panel:  No results for input(s): VITAMINB12, FOLATE, FERRITIN, TIBC, IRON, RETICCTPCT in the last 72 hours. Urine analysis:    Component Value Date/Time   COLORURINE YELLOW 07/09/2021 2145   APPEARANCEUR CLEAR 07/09/2021 2145   LABSPEC 1.010 07/09/2021 2145   PHURINE 6.0 07/09/2021 2145   GLUCOSEU NEGATIVE 07/09/2021 2145   HGBUR NEGATIVE 07/09/2021 2145   BILIRUBINUR NEGATIVE 07/09/2021 2145   KETONESUR NEGATIVE 07/09/2021 2145   PROTEINUR NEGATIVE 07/09/2021 2145   UROBILINOGEN 1.0 03/20/2015 0941   NITRITE NEGATIVE 07/09/2021 2145   LEUKOCYTESUR NEGATIVE 07/09/2021 2145    Radiological Exams on Admission - Personally Reviewed: CT ABDOMEN PELVIS WO CONTRAST  Result Date: 07/09/2021 CLINICAL DATA:  Nausea, vomiting, weakness EXAM: CT ABDOMEN AND PELVIS WITHOUT CONTRAST TECHNIQUE: Multidetector CT imaging of the abdomen and pelvis was performed following the standard protocol without IV contrast. IV contrast not administered due to impaired renal function. Sagittal and coronal MPR images reconstructed from axial data set. COMPARISON:  06/15/2012 FINDINGS: Lower chest: Emphysematous and bronchitic changes at lung bases. Interstitial lung disease changes LEFT lower lobe, minimally RIGHT base, progressive since 2013. Thickening at base of LEFT major fissure. Hepatobiliary: Gallbladder and liver normal appearance Pancreas: Atrophic without mass Spleen: Normal appearance Adrenals/Urinary Tract: Small nonobstructing calculus at inferior pole LEFT kidney. Adrenal glands, kidneys, ureters, and bladder otherwise normal appearance. Stomach/Bowel: Normal appendix, retrocecal. Increased stool throughout colon and rectum. Stomach and small bowel loops normal appearance. No evidence of bowel obstruction or wall thickening. Vascular/Lymphatic: Extensive atherosclerotic calcifications  aorta, iliac arteries, femoral arteries, visceral arteries, and coronary arteries. Fusiform aneurysmal dilatation mid abdominal aorta 4.1 x 3.2 cm previously 3.8 x 3.0 cm. Suspected high-grade narrowings of the common iliac arteries bilaterally. Stent identified RIGHT external iliac artery, suspect LEFT external iliac artery as well. No adenopathy. Reproductive: Minimal prostatic enlargement. Seminal vesicles unremarkable. Other: Small LEFT inguinal hernia containing fat. No free air or free fluid. No inflammatory process. Musculoskeletal: Osseous demineralization with degenerative disc and facet disease changes lumbar spine. BILATERAL spondylolysis L4 with grade 2 spondylolisthesis L4-L5. IMPRESSION: Increased stool throughout colon and rectum. Small LEFT inguinal hernia containing fat. Extensive atherosclerotic disease changes with fusiform aneurysmal dilatation of mid abdominal aorta 4.1 x 3.2 cm; recommend follow-up every 12 months and vascular consultation. This recommendation follows ACR consensus guidelines: White Paper of the ACR Incidental Findings Committee II on Vascular Findings. J Am Coll Radiol 2013; 10:789-794. Suspected high-grade stenoses of the common iliac arteries bilaterally. Small nonobstructing LEFT renal calculus. BILATERAL spondylolysis L4 with grade 2 spondylolisthesis L4-L5. Emphysematous changes with progressive interstitial lung disease at lung bases particularly on LEFT. Emphysema (ICD10-J43.9). Aortic Atherosclerosis (ICD10-I70.0). Aortic aneurysm NOS (ICD10-I71.9). Electronically Signed   By: Lavonia Dana M.D.   On: 07/09/2021 19:01   CT HEAD WO CONTRAST (5MM)  Result Date: 07/09/2021 CLINICAL DATA:  Delirium EXAM: CT HEAD WITHOUT CONTRAST TECHNIQUE: Contiguous axial images were obtained from the base of the skull through the vertex without intravenous contrast. COMPARISON:  Brain MRA 03/23/2020, brain MRI 02/11/2020 FINDINGS: Image quality is degraded by motion artifact. Brain:  There is no evidence of acute intracranial hemorrhage, extra-axial fluid collection, or infarct. There is global parenchymal volume loss with commensurate enlargement of the ventricular system, similar to the prior MRI. There is confluent hypodensity in the subcortical and periventricular white matter likely reflecting sequela of advanced chronic white matter microangiopathy. There is no mass lesion. There is no midline shift. Vascular: There is calcification of the bilateral cavernous ICAs. Skull: Normal. Negative for  definite fracture or focal lesion. Sinuses/Orbits: The imaged paranasal sinuses are clear. Bilateral lens implants are in place. The globes and orbits are otherwise unremarkable. Other: None. IMPRESSION: 1. No acute intracranial pathology, within the confines of motion degraded images. 2. Overall unchanged parenchymal volume loss and chronic white matter microangiopathy. 3 Electronically Signed   By: Valetta Mole M.D.   On: 07/09/2021 15:17   DG Chest Portable 1 View  Result Date: 07/09/2021 CLINICAL DATA:  Weakness, generalized weakness for 3 days, unable to walk due to bilateral leg weakness. EXAM: PORTABLE CHEST 1 VIEW COMPARISON:  CT of the chest abdomen and pelvis from 2016. FINDINGS: Post median sternotomy for CABG.  EKG leads project over the chest. Cardiomediastinal contours with mild cardiac enlargement, accentuated by portable technique and low lung volumes. No lobar consolidative process. There is however patchy opacities overlying the LEFT hemidiaphragm and LEFT heart border and increased density in the retrocardiac region. No pneumothorax. On limited assessment there is no acute skeletal process. IMPRESSION: Findings suspicious for developing LEFT lower lobe pneumonia. Post median sternotomy for CABG. Electronically Signed   By: Zetta Bills M.D.   On: 07/09/2021 14:20    EKG: Personally reviewed.  Rhythm is sinus bradycardia with heart rate of 52 bpm.  No dynamic ST segment  changes appreciated.  Assessment/Plan Principal Problem:   Acute metabolic encephalopathy  Patient presenting with several day history of significant lethargy, minimal responsiveness and inability to ambulate Etiology is not entirely clear at this point Possible left lower lobe pneumonia on chest x-ray and therefore a pneumonia could possibly be precipitating the symptoms Treating possible pneumonia with intravenous antibiotics and intravenous fluids Additionally obtaining CT head, TSH, vitamin B12, folate, ammonia level, hepatic function panel, urinalysis Monitoring for symptomatic improvement If patient fails to clinically improve in the following 24 to 48 hours we will consider expanding work-up which would include MRI brain (patient apparently has had evidence of multiple subclinical strokes in the past on last MRI) and possible EEG monitoring.  Active Problems:    Pneumonia of left lower lobe due to infectious organism  Possible left lower lobe pneumonia noted on chest x-ray This is possibly the cause of the patient's encephalopathy Obtaining blood cultures Obtaining procalcitonin and CRP Treating with intravenous ceftriaxone and azithromycin Patient not requiring oxygen at this time    Type 2 diabetes mellitus with stage 3a chronic kidney disease, without long-term current use of insulin (HCC)  Accu-Cheks before every meal and nightly with sliding scale insulin Hemoglobin A1c pending    GERD without esophagitis  Continue PPI daily    Chronic kidney disease, stage 3a (HCC)  Creatinine slightly above baseline, possibly secondary to volume depletion Strict input and output monitoring Monitoring renal function and electrolytes with serial chemistries Avoiding nephrotoxic agents if at all possible    Dementia without behavioral disturbance ([place])  Presence of underlying dementia complicates patient's presentation Wife states the patient's current presentation with severe  lethargy and decreased responsiveness is much worse than baseline Minimizing painful stimul Nursing to perform frequent redirecting of the patient Encouraging family to remain at bedside is much as possible    Coronary artery disease involving native heart without angina pectoris  Continue home regimen of antiplatelet therapy, beta-blocker Monitoring on telemetry   Code Status:  DNR  code status decision has been confirmed with: The spouse who is planning on bringing DNR documentation. Family Communication: Spouse has been updated on plan of care via phone conversation.  Status is: Inpatient  Remains inpatient appropriate because:Ongoing diagnostic testing needed not appropriate for outpatient work up, IV treatments appropriate due to intensity of illness or inability to take PO, and Inpatient level of care appropriate due to severity of illness  Dispo: The patient is from: Home              Anticipated d/c is to: ALF              Patient currently is not medically stable to d/c.   Difficult to place patient Yes        Vernelle Emerald MD [facility] Pager [phone]  If 7PM-7AM, please contact night-coverage [email or web address] Use universal [place] password for that web site. If you do not have the password, please call the hospital operator.  07/10/2021, 9:03 AM

## 2021-07-10 NOTE — Evaluation (Signed)
Physical Therapy Evaluation Patient Details Name: [name] MRN: [id number] DOB: 25-Jul-1935 Today's Date: 07/10/2021   History of Present Illness  Pt is 86 yo male presenting with progressive weakness and inability to ambulate on 07/09/21. Pt found to have PNE with acute metabolic encephalopathy. Pt with hx of dementia, PVD, HTN, GERD, DM2, kidney disease, hyperlipidemia, hyponatremia, CAD s/p CABG.  Clinical Impression  Pt admitted with above diagnosis. At baseline, pt was ambulating in house with RW but slow and labored gait.  He had assist with ADLs.  Pt has been unable to stand or ambulate last 3 days.  Today, pt requiring max A for bed mobility and attempts to stand.  Unable to transfer to chair due to unsafe and was soiled needing to return to bed for cleaning.  Pt will need SNF level of care .  Wife reports they are in process of moving to [facility] but need to clarify  if they can provide level that pt needs at this time.   Pt currently with functional limitations due to the deficits listed below (see PT Problem List). Pt will benefit from skilled PT to increase their independence and safety with mobility to allow discharge to the venue listed below.       Follow Up Recommendations SNF (Potential for [facility] (if they can provide level of care))    Equipment Recommendations  Other (comment) (hoyer (needs further assessment))    Recommendations for Other Services       Precautions / Restrictions Precautions Precautions: Fall      Mobility  Bed Mobility Overal bed mobility: Needs Assistance Bed Mobility: Rolling;Supine to Sit;Sit to Supine Rolling: Max assist   Supine to sit: Mod assist Sit to supine: +2 for physical assistance;Mod assist   General bed mobility comments: Supine to sit: increased time, multimodal cues for sequencing, assist with legs and trunk; Sit to supine : Mod x 2 due to soiled; Rolling: both sides max A with cues to reach and assist bending legs to  facilitate - pt stiff and tends to push back, worked on trunk rotation/reaching with rolls    Transfers Overall transfer level: Needs assistance Equipment used: Rolling walker (2 wheeled) Transfers: Sit to/from Stand Sit to Stand: Max assist;From elevated surface         General transfer comment: x 7 partial stands from elevated bed but unable to get upright;  difficulty coordinating movements; multimodal cues to lean forward and facilitation to assist  Ambulation/Gait             General Gait Details: unable  Stairs            Wheelchair Mobility    Modified Rankin (Stroke Patients Only)       Balance Overall balance assessment: Needs assistance Sitting-balance support: Bilateral upper extremity supported Sitting balance-Leahy Scale: Poor Sitting balance - Comments: Strong posterior lean initially with mod A but worked on reach forward to table and able to progress to min guard.  Sat EOB 10 mins     Standing balance-Leahy Scale: Zero                               Pertinent Vitals/Pain Pain Assessment: No/denies pain    Home Living Family/patient expects to be discharged to:: Private residence Living Arrangements: Spouse/significant other Available Help at Discharge: Family;Available 24 hours/day;Personal care attendant (wife and care giver [phone]) Type of Home: House Home Access: Ramped  entrance     Home Layout: Two level;Able to live on main level with bedroom/bathroom Home Equipment: Wheelchair - [employer] - 4 wheels Additional Comments: Currently lives in townhome with wife but is moving to [facility] any day.  Wife reports he will be at [facility]F which will provide him with assist for all ADLs and mobility.    Prior Function Level of Independence: Needs assistance   Gait / Transfers Assistance Needed: Ambulated with RW but was unsteady and slow.  Household distances and very limited community  ADL's / Land  Needed: Required assist with all ADLs        Hand Dominance        Extremity/Trunk Assessment   Upper Extremity Assessment Upper Extremity Assessment: Generalized weakness (ROM WFL; not follow MMT commands; at least 3/5)    Lower Extremity Assessment Lower Extremity Assessment: Generalized weakness;LLE deficits/detail;RLE deficits/detail RLE Deficits / Details: ROM bil knees lacking 10-15 degrees ext with tight endfeel; MMT at least 3/5 but not following further commands LLE Deficits / Details: ROM bil knees lacking 10-15 degrees ext with tight endfeel; MMT at least 3/5 but not following further commands    Cervical / Trunk Assessment Cervical / Trunk Assessment: Kyphotic  Communication      Cognition Arousal/Alertness: Awake/alert Behavior During Therapy: Flat affect                                   General Comments: Has cognitive deficits but worse at this time.  ONly oriented to self.  Follows simple commands with increased time.      General Comments General comments (skin integrity, edema, etc.): Wife reports movers coming Tuesday to take furniture to [facility]  Discussed pt needing SNF level of care - wife reports pt to go to [facility] which she believes has skilled care.  Notified case manager that wife asking about could [employer] handle pt and if not she reports she needs to cancel movers.   Pt incontinent of stool and urine. Assisted tech with cleaning pt while working on bed mobility.    Exercises     Assessment/Plan    PT Assessment Patient needs continued PT services  PT Problem List Decreased strength;Decreased mobility;Decreased safety awareness;Decreased range of motion;Decreased coordination;Decreased activity tolerance;Decreased cognition;Decreased balance;Decreased knowledge of use of DME       PT Treatment Interventions DME instruction;Therapeutic activities;Gait training;Therapeutic exercise;Patient/family  education;Balance training;Functional mobility training;Wheelchair mobility training;Cognitive remediation    PT Goals (Current goals can be found in the Care Plan section)  Acute Rehab PT Goals Patient Stated Goal: wife hopeful for [facility] PT Goal Formulation: With patient/family Time For Goal Achievement: 07/24/21 Potential to Achieve Goals: Fair    Frequency Min 2X/week   Barriers to discharge        Co-evaluation               AM-PAC PT "6 Clicks" Mobility  Outcome Measure Help needed turning from your back to your side while in a flat bed without using bedrails?: Total Help needed moving from lying on your back to sitting on the side of a flat bed without using bedrails?: Total Help needed moving to and from a bed to a chair (including a wheelchair)?: Total Help needed standing up from a chair using your arms (e.g., wheelchair or bedside chair)?: Total Help needed to walk in hospital room?: Total Help  needed climbing 3-5 steps with a railing? : Total 6 Click Score: 6    End of Session Equipment Utilized During Treatment: Gait belt Activity Tolerance: Patient tolerated treatment well Patient left: in bed;with call bell/phone within reach;with bed alarm set Nurse Communication: Mobility status PT Visit Diagnosis: Unsteadiness on feet (R26.81);Muscle weakness (generalized) (M62.81)    Time: 1117-1203 PT Time Calculation (min) (ACUTE ONLY): 46 min   Charges:   PT Evaluation $PT Eval Moderate Complexity: 1 Mod PT Treatments $Therapeutic Activity: 23-37 mins        Abran Richard, PT Acute Rehab Services Pager [phone] [facility] Rehab [phone]   [name] 07/10/2021, 12:19 PM

## 2021-07-10 NOTE — TOC Initial Note (Signed)
Transition of Care [facility]) - Initial/Assessment Note    Patient Details  Name: [name] MRN: [id number] Date of Birth: January 05, 1935  Transition of Care [facility]) CM/SW Contact:    Ross Ludwig, LCSW Phone Number: 07/10/2021, 4:00 PM  Clinical Narrative:                  Patient is an 86 year old male who is alert and oriented x1.  Patient is from home with his wife, however they are in the process of moving him to [place].  CSW contacted Mathews at [facility] [phone] and informed her of the PT notes and recommendations from today.  PT is recommending SNF, however this is the first day patient has been able to try to ambulate.  CSW talked to patient's wife and ALF to discuss if patient needs to go to SNF first or not.  Per Burna Mortimer, she requested to have PT try to see him again and see how he does in the next few days and to follow up with her on Monday.  CSW asked PT if they can try to see him again either on Sunday or Monday, and they will try to get him on the schedule for Monday, if they have time will try to see him tomorrow.  CSW to continue to follow patient's progress throughout discharge planning.  Patient will need an updated FL2 once patient is ready for discharge from hospital and current Covid test within the last two days.  Expected Discharge Plan: Assisted Living Barriers to Discharge: Continued Medical Work up   Patient Goals and CMS Choice Patient states their goals for this hospitalization and ongoing recovery are:: To go to [facility]. CMS Medicare.gov Compare Post Acute Care list provided to:: Patient Represenative (must comment) Choice offered to / list presented to : Spouse  Expected Discharge Plan and Services Expected Discharge Plan: Assisted Living     Post Acute Care Choice:  ([facility]) Living arrangements for the past 2 months: Single Family Home                                      Prior Living  Arrangements/Services Living arrangements for the past 2 months: Single Family Home Lives with:: Spouse Patient language and need for interpreter reviewed:: Yes Do you feel safe going back to the place where you live?: Yes      Need for Family Participation in Patient Care: Yes (Comment) Care giver support system in place?: Yes (comment)   Criminal Activity/Legal Involvement Pertinent to Current Situation/Hospitalization: No - Comment as needed  Activities of Daily Living Home Assistive Devices/Equipment: Wheelchair ADL Screening (condition at time of admission) Patient's cognitive ability adequate to safely complete daily activities?: No Is the patient deaf or have difficulty hearing?: No Does the patient have difficulty seeing, even when wearing glasses/contacts?: No Does the patient have difficulty concentrating, remembering, or making decisions?: Yes Patient able to express need for assistance with ADLs?: No Does the patient have difficulty dressing or bathing?: Yes Independently performs ADLs?: No Communication: Needs assistance Is this a change from baseline?: Pre-admission baseline Dressing (OT): Needs assistance Is this a change from baseline?: Pre-admission baseline Grooming: Needs assistance Is this a change from baseline?: Pre-admission baseline Feeding: Needs assistance Is this a change from baseline?: Pre-admission baseline Bathing: Needs assistance Is this a change from baseline?: Pre-admission baseline Toileting: Needs  assistance Is this a change from baseline?: Pre-admission baseline In/Out Bed: Needs assistance Is this a change from baseline?: Pre-admission baseline Walks in Home: Dependent Is this a change from baseline?: Pre-admission baseline Does the patient have difficulty walking or climbing stairs?: Yes Weakness of Legs: Both Weakness of Arms/Hands: None  Permission Sought/Granted Permission sought to share information with : Case Manager, Programmer, applications, Family Supports Permission granted to share information with : Yes, Release of Information Signed  Share Information with NAME: Cadarius, Ryals [id number]  [phone]  Maijor, Gines   [id number]  marcquise, furness   [id number]  Permission granted to share info w AGENCY: [facility] and SNF admissions        Emotional Assessment Appearance:: Appears stated age   Affect (typically observed): Accepting, Appropriate, Calm Orientation: : Oriented to Self Alcohol / Substance Use: Not Applicable Psych Involvement: No (comment)  Admission diagnosis:  Generalized weakness [R53.1] Pneumonia of left lower lobe due to infectious organism [J18.9] Community acquired pneumonia of left lower lobe of lung [J18.9] Patient Active Problem List   Diagnosis Date Noted   Acute metabolic encephalopathy [id number]   Mixed diabetic hyperlipidemia associated with type 2 diabetes mellitus ([place]) 07/10/2021   GERD without esophagitis 07/10/2021   Chronic kidney disease, stage 3a ([place]) 07/10/2021   Dementia without behavioral disturbance ([place]) 07/10/2021   Coronary artery disease involving native heart without angina pectoris 07/10/2021   Pneumonia of left lower lobe due to infectious organism 07/09/2021   Laceration of left index finger without foreign body without damage to nail 08/24/2017   Type 2 diabetes mellitus with stage 3a chronic kidney disease, without long-term current use of insulin ([place]) 11/04/2015   Left carotid bruit 11/04/2015   PVD with claudication - s/p PV surgery May 2016 03/23/2015   Intractable hiccups 02/16/2015   Dehydration 02/16/2015   Hyponatremia 02/16/2015   Perineal abscess 06/20/2013   Essential hypertension    Hx of CABG 2009    Hyperlipidemia    Chronic constipation 06/27/2012   PCP:  Ginger Organ., MD Pharmacy:   [id number] [place], [place] [id number]: [phone]: [phone]    Social Determinants of Health (SDOH) Interventions    Readmission Risk Interventions No flowsheet data found.

## 2021-07-10 NOTE — Plan of Care (Signed)

## 2021-07-11 LAB — BASIC METABOLIC PANEL
Anion gap: 12 (ref 5–15)
BUN: 22 mg/dL (ref 8–23)
CO2: 22 mmol/L (ref 22–32)
Calcium: 8.7 mg/dL — ABNORMAL LOW (ref 8.9–10.3)
Chloride: 98 mmol/L (ref 98–111)
Creatinine, Ser: 1.35 mg/dL — ABNORMAL HIGH (ref 0.61–1.24)
GFR, Estimated: 52 mL/min — ABNORMAL LOW (ref >60)
Glucose, Bld: 82 mg/dL (ref 70–99)
Potassium: 4.5 mmol/L (ref 3.5–5.1)
Sodium: 132 mmol/L — ABNORMAL LOW (ref 135–145)

## 2021-07-11 LAB — MAGNESIUM: Magnesium: 1.7 mg/dL (ref 1.7–2.4)

## 2021-07-11 LAB — CORTISOL: Cortisol, Plasma: 5.4 ug/dL

## 2021-07-11 LAB — GLUCOSE, CAPILLARY
Glucose-Capillary: 93 mg/dL (ref 70–99)
Glucose-Capillary: 155 mg/dL — ABNORMAL HIGH (ref 70–99)
Glucose-Capillary: 129 mg/dL — ABNORMAL HIGH (ref 70–99)
Glucose-Capillary: 153 mg/dL — ABNORMAL HIGH (ref 70–99)

## 2021-07-11 LAB — OSMOLALITY: Osmolality: 285 mOsm/kg (ref 275–295)

## 2021-07-11 MED ORDER — SODIUM CHLORIDE 1 G PO TABS
1.0000 g | ORAL_TABLET | Freq: Two times a day (BID) | ORAL | Status: DC
  Administered 2021-07-11: 1 g via ORAL
  Filled 2021-07-11 (×2): qty 1

## 2021-07-11 NOTE — Progress Notes (Signed)
PROGRESS NOTE    [name]  [id number] DOB: Mar 03, 1935 DOA: 07/09/2021 PCP: Ginger Organ., MD    Chief Complaint  Patient presents with   Weakness    Brief Narrative:  History of non-insulin-dependent type diabetes,, CAD status post CABG, PAD status post bilateral lower extremity arterial angioplasty and stenting in 2016, CKD 3B presented to the hospital due to weakness, acute encephalopathy, found to have lobar pneumonia and hyponatremia  Subjective:  He is calm and cooperative currently,  Appear less confused, he is oriented to person and place, not to time He denies pain, there is no fever  Wife requested PPD be placed to facilitate ALF placement  Assessment & Plan:   Principal Problem:   Acute metabolic encephalopathy Active Problems:   Essential hypertension   Type 2 diabetes mellitus with stage 3a chronic kidney disease, without long-term current use of insulin (HCC)   Pneumonia of left lower lobe due to infectious organism   GERD without esophagitis   Chronic kidney disease, stage 3a ([place])   Dementia without behavioral disturbance ([place])   Coronary artery disease involving native heart without angina pectoris    Acute metabolic encephalopathy/failure to thrive -ct head no acute findings, showed "Overall unchanged parenchymal volume loss and chronic white matter microangiopathy" -Confusion and weakness likely due to pneumonia/also has hyponatremia -Delirium protocol, family will stay with patient as well -slightly improved - PT eval recommended skilled nursing facility placement, social worker to check with current [facility]F ( [facility] ) to see if they can take him, PPD placed on 9/10 pm per family /ALF request  Lobar pneumonia Chest x-ray with left lower lobe pneumonia, blood culture in process urine strep pneumo antigen negative  Is started on Rocephin and Zithromax in the ED, continue Wife reports him strangle /chocking at times, he vomited twice in the  last two weeks But appetite is good  Will have speech see him  Hyponatremia  serum osmolality ,urine sodium and urine osmole suggest SIADH TSH within normal limit a.m. cortisol level 5.4 Start salt tabs  AKI on CKD 3B UA unremarkable Cr 1.79 on presentation Cr 1.35 today Hold ramipril for now Repeat bmp in am  Non-insulin-dependent type 2 diabetes, controlled  A1c 6.3 Hold home medication metformin Started on SSI here  Hypertension Continue metoprolol, hold ramipril for now due to AKI  CAD, h/o CABG in 2009 Denies chest pain Continue metoprolol, Plavix I do not see statin on home med list  PAD, status post bilateral lower extremity arterial angioplasty and stenting in 2016 Continue Plavix I do not see statin on home med list   The patient's BMI is: Body mass index is 24.65 kg/m.Marland Kitchen  Dementia/FTT To ALF vs SNF PPD placed on 9/10 pm per family/ALF request Social worker following       [employer] (From admission, onward)     Start     Ordered   07/11/21 [id number]  Basic metabolic panel  Daily,   R      07/10/21 0859   07/10/21 0047  Culture, blood (routine x 2) Call MD if unable to obtain prior to antibiotics being given  (COPD / Pneumonia / Cellulitis / Lower Extremity Wound)  BLOOD CULTURE X 2,   R     Comments: If blood cultures drawn in Emergency Department - Do not draw and cancel order    07/10/21 0050   07/10/21 0047  Expectorated Sputum Assessment w Gram Stain, Rflx to Resp Cult  (COPD /  Pneumonia / Cellulitis / Lower Extremity Wound)  Once,   R        07/10/21 0050   07/09/21 1341  CBC with Differential  ONCE - STAT,   STAT        07/09/21 1341              DVT prophylaxis: enoxaparin (LOVENOX) injection 40 mg Start: 07/10/21 1000   Code Status: DNR Family Communication: Wife at bedside Disposition:   Status is: Inpatient   Dispo: The patient is from: Home              Anticipated d/c is to: ALF vs SNF              Anticipated d/c  date is: 24-48hrs                Consultants:  None  Procedures:  None  Antimicrobials:    Anti-infectives (From admission, onward)    Start     Dose/Rate Route Frequency Ordered Stop   07/10/21 2230  cefTRIAXone (ROCEPHIN) 2 g in sodium chloride 0.9 % 100 mL IVPB        2 g 200 mL/hr over 30 Minutes Intravenous Every 24 hours 07/10/21 0050 07/15/21 2229   07/10/21 2230  azithromycin (ZITHROMAX) 500 mg in sodium chloride 0.9 % 250 mL IVPB        500 mg 250 mL/hr over 60 Minutes Intravenous Every 24 hours 07/10/21 0050 07/15/21 2229   07/09/21 2230  cefTRIAXone (ROCEPHIN) 1 g in sodium chloride 0.9 % 100 mL IVPB        1 g 200 mL/hr over 30 Minutes Intravenous  Once 07/09/21 2218 07/09/21 2332   07/09/21 2230  azithromycin (ZITHROMAX) 500 mg in sodium chloride 0.9 % 250 mL IVPB        500 mg 250 mL/hr over 60 Minutes Intravenous  Once 07/09/21 2218 07/10/21 0034          Objective: Vitals:   07/10/21 0815 07/10/21 1215 07/10/21 2017 07/11/21 0434  BP: 113/73 (!) 149/84 136/68 (!) 141/70  Pulse: 78 (!) 58 66 73  Resp: '17 16 16 16  '$ Temp: 97.8 F (36.6 C) 97.9 F (36.6 C) 97.6 F (36.4 C) 97.8 F (36.6 C)  TempSrc: Axillary Axillary Oral Oral  SpO2: 94% 96% 100% 99%  Weight:      Height:        Intake/Output Summary (Last 24 hours) at 07/11/2021 1129 Last data filed at 07/11/2021 1002 Gross per 24 hour  Intake 704 ml  Output --  Net 704 ml   Filed Weights   07/09/21 1202  Weight: 87.1 kg    Examination:  General exam: Frail, demented elderly , oriented x2 , calm and cooperative Respiratory system:  improved aeration, no wheezing, no rales, no rhonchi, respiratory effort normal. Cardiovascular system: S1 & S2 heard, RRR.  Gastrointestinal system: Abdomen is nondistended, soft and nontender. Normal bowel sounds heard. Central nervous system: Alert and orientedx2. No focal neurological deficits. Extremities: no edema Skin: No rashes, lesions or  ulcers Psychiatry: Demented, calm and cooperative    Data Reviewed: I have personally reviewed following labs and imaging studies  CBC: Recent Labs  Lab 07/09/21 1510 07/10/21 0030  WBC 8.4 8.1  NEUTROABS 5.3 4.7  HGB 14.2 11.8*  HCT 42.0 34.3*  MCV 89.2 86.8  PLT 161 [id number]    Basic Metabolic Panel: Recent Labs  Lab 07/09/21 1510 07/10/21 0030 07/11/21 0537  NA 130*  130* 132*  K 5.0 4.6 4.5  CL 94* 95* 98  CO2 '25 24 22  '$ GLUCOSE 101* 104* 82  BUN 35* 31* 22  CREATININE 1.79* 1.54* 1.35*  CALCIUM 9.3 8.7* 8.7*  MG  --  1.7 1.7    GFR: Estimated Creatinine Clearance: 47.4 mL/min (A) (by C-G formula based on SCr of 1.35 mg/dL (H)).  Liver Function Tests: Recent Labs  Lab 07/09/21 1510 07/10/21 0030  AST 22 19  ALT 14 12  ALKPHOS 68 59  BILITOT 1.0 0.8  PROT 7.6 6.6  ALBUMIN 4.4 3.5    CBG: Recent Labs  Lab 07/10/21 1655 07/10/21 1741 07/10/21 2020 07/11/21 0730 07/11/21 1111  GLUCAP 65* 93 157* 93 155*     Recent Results (from the past 240 hour(s))  Resp Panel by RT-PCR (Flu A&B, Covid) Nasopharyngeal Swab     Status: None   Collection Time: 07/09/21  3:59 PM   Specimen: Nasopharyngeal Swab; Nasopharyngeal(NP) swabs in vial transport medium  Result Value Ref Range Status   SARS Coronavirus 2 by RT PCR NEGATIVE NEGATIVE Final    Comment: (NOTE) SARS-CoV-2 target nucleic acids are NOT DETECTED.  The SARS-CoV-2 RNA is generally detectable in upper respiratory specimens during the acute phase of infection. The lowest concentration of SARS-CoV-2 viral copies this assay can detect is 138 copies/mL. A negative result does not preclude SARS-Cov-2 infection and should not be used as the sole basis for treatment or other patient management decisions. A negative result may occur with  improper specimen collection/handling, submission of specimen other than nasopharyngeal swab, presence of viral mutation(s) within the areas targeted by this assay, and  inadequate number of viral copies(<138 copies/mL). A negative result must be combined with clinical observations, patient history, and epidemiological information. The expected result is Negative.  Fact Sheet for Patients:  EntrepreneurPulse.com.au  Fact Sheet for Healthcare Providers:  IncredibleEmployment.be  This test is no t yet approved or cleared by the [place] FDA and  has been authorized for detection and/or diagnosis of SARS-CoV-2 by FDA under an Emergency Use Authorization (EUA). This EUA will remain  in effect (meaning this test can be used) for the duration of the COVID-19 declaration under Section 564(b)(1) of the Act, 21 U.S.C.section 360bbb-3(b)(1), unless the authorization is terminated  or revoked sooner.       Influenza A by PCR NEGATIVE NEGATIVE Final   Influenza B by PCR NEGATIVE NEGATIVE Final    Comment: (NOTE) The Xpert Xpress SARS-CoV-2/FLU/RSV plus assay is intended as an aid in the diagnosis of influenza from Nasopharyngeal swab specimens and should not be used as a sole basis for treatment. Nasal washings and aspirates are unacceptable for Xpert Xpress SARS-CoV-2/FLU/RSV testing.  Fact Sheet for Patients: EntrepreneurPulse.com.au  Fact Sheet for Healthcare Providers: IncredibleEmployment.be  This test is not yet approved or cleared by the [place] FDA and has been authorized for detection and/or diagnosis of SARS-CoV-2 by FDA under an Emergency Use Authorization (EUA). This EUA will remain in effect (meaning this test can be used) for the duration of the COVID-19 declaration under Section 564(b)(1) of the Act, 21 U.S.C. section 360bbb-3(b)(1), unless the authorization is terminated or revoked.  Performed at [facility], [place] [address] [place], [place] [address]          Radiology Studies: CT ABDOMEN PELVIS WO CONTRAST  Result Date:  07/09/2021 CLINICAL DATA:  Nausea, vomiting, weakness EXAM: CT ABDOMEN AND PELVIS WITHOUT CONTRAST TECHNIQUE: Multidetector CT imaging of  the abdomen and pelvis was performed following the standard protocol without IV contrast. IV contrast not administered due to impaired renal function. Sagittal and coronal MPR images reconstructed from axial data set. COMPARISON:  06/15/2012 FINDINGS: Lower chest: Emphysematous and bronchitic changes at lung bases. Interstitial lung disease changes LEFT lower lobe, minimally RIGHT base, progressive since 2013. Thickening at base of LEFT major fissure. Hepatobiliary: Gallbladder and liver normal appearance Pancreas: Atrophic without mass Spleen: Normal appearance Adrenals/Urinary Tract: Small nonobstructing calculus at inferior pole LEFT kidney. Adrenal glands, kidneys, ureters, and bladder otherwise normal appearance. Stomach/Bowel: Normal appendix, retrocecal. Increased stool throughout colon and rectum. Stomach and small bowel loops normal appearance. No evidence of bowel obstruction or wall thickening. Vascular/Lymphatic: Extensive atherosclerotic calcifications aorta, iliac arteries, femoral arteries, visceral arteries, and coronary arteries. Fusiform aneurysmal dilatation mid abdominal aorta 4.1 x 3.2 cm previously 3.8 x 3.0 cm. Suspected high-grade narrowings of the common iliac arteries bilaterally. Stent identified RIGHT external iliac artery, suspect LEFT external iliac artery as well. No adenopathy. Reproductive: Minimal prostatic enlargement. Seminal vesicles unremarkable. Other: Small LEFT inguinal hernia containing fat. No free air or free fluid. No inflammatory process. Musculoskeletal: Osseous demineralization with degenerative disc and facet disease changes lumbar spine. BILATERAL spondylolysis L4 with grade 2 spondylolisthesis L4-L5. IMPRESSION: Increased stool throughout colon and rectum. Small LEFT inguinal hernia containing fat. Extensive atherosclerotic disease  changes with fusiform aneurysmal dilatation of mid abdominal aorta 4.1 x 3.2 cm; recommend follow-up every 12 months and vascular consultation. This recommendation follows ACR consensus guidelines: White Paper of the ACR Incidental Findings Committee II on Vascular Findings. J Am Coll Radiol 2013; 10:789-794. Suspected high-grade stenoses of the common iliac arteries bilaterally. Small nonobstructing LEFT renal calculus. BILATERAL spondylolysis L4 with grade 2 spondylolisthesis L4-L5. Emphysematous changes with progressive interstitial lung disease at lung bases particularly on LEFT. Emphysema (ICD10-J43.9). Aortic Atherosclerosis (ICD10-I70.0). Aortic aneurysm NOS (ICD10-I71.9). Electronically Signed   By: Lavonia Dana M.D.   On: 07/09/2021 19:01   CT HEAD WO CONTRAST (5MM)  Result Date: 07/09/2021 CLINICAL DATA:  Delirium EXAM: CT HEAD WITHOUT CONTRAST TECHNIQUE: Contiguous axial images were obtained from the base of the skull through the vertex without intravenous contrast. COMPARISON:  Brain MRA 03/23/2020, brain MRI 02/11/2020 FINDINGS: Image quality is degraded by motion artifact. Brain: There is no evidence of acute intracranial hemorrhage, extra-axial fluid collection, or infarct. There is global parenchymal volume loss with commensurate enlargement of the ventricular system, similar to the prior MRI. There is confluent hypodensity in the subcortical and periventricular white matter likely reflecting sequela of advanced chronic white matter microangiopathy. There is no mass lesion. There is no midline shift. Vascular: There is calcification of the bilateral cavernous ICAs. Skull: Normal. Negative for definite fracture or focal lesion. Sinuses/Orbits: The imaged paranasal sinuses are clear. Bilateral lens implants are in place. The globes and orbits are otherwise unremarkable. Other: None. IMPRESSION: 1. No acute intracranial pathology, within the confines of motion degraded images. 2. Overall unchanged  parenchymal volume loss and chronic white matter microangiopathy. 3 Electronically Signed   By: Valetta Mole M.D.   On: 07/09/2021 15:17   DG Chest Portable 1 View  Result Date: 07/09/2021 CLINICAL DATA:  Weakness, generalized weakness for 3 days, unable to walk due to bilateral leg weakness. EXAM: PORTABLE CHEST 1 VIEW COMPARISON:  CT of the chest abdomen and pelvis from 2016. FINDINGS: Post median sternotomy for CABG.  EKG leads project over the chest. Cardiomediastinal contours with mild cardiac enlargement, accentuated by portable  technique and low lung volumes. No lobar consolidative process. There is however patchy opacities overlying the LEFT hemidiaphragm and LEFT heart border and increased density in the retrocardiac region. No pneumothorax. On limited assessment there is no acute skeletal process. IMPRESSION: Findings suspicious for developing LEFT lower lobe pneumonia. Post median sternotomy for CABG. Electronically Signed   By: Zetta Bills M.D.   On: 07/09/2021 14:20        Scheduled Meds:  clopidogrel  75 mg Oral Q supper   enoxaparin (LOVENOX) injection  40 mg Subcutaneous Q24H   insulin aspart  0-15 Units Subcutaneous TID AC & HS   metoprolol succinate  25 mg Oral Q supper   pantoprazole  40 mg Oral QAC supper   sodium chloride  1 g Oral BID WC   tuberculin  5 Units Intradermal Once   Continuous Infusions:  azithromycin Stopped (07/10/21 2233)   cefTRIAXone (ROCEPHIN)  IV Stopped (07/10/21 2205)     LOS: 2 days   Time spent: 35 mins Greater than 50% of this time was spent in counseling, explanation of diagnosis, planning of further management, and coordination of care.   Voice Recognition Viviann Spare dictation system was used to create this note, attempts have been made to correct errors. Please contact the author with questions and/or clarifications.   Florencia Reasons, MD PhD FACP [facility]  Available via Epic secure chat 7am-7pm for nonurgent issues Please page  for urgent issues To page the attending provider between 7A-7P or the covering provider during after hours 7P-7A, please log into the web site [email or web address] and access using universal [place] password for that web site. If you do not have the password, please call the hospital operator.    07/11/2021, 11:29 AM

## 2021-07-11 NOTE — Evaluation (Signed)
Clinical/Bedside Swallow Evaluation Patient Details  Name: [name] MRN: [id number] Date of Birth: 03-Oct-1935  Today's Date: 07/11/2021 Time: SLP Start Time (ACUTE ONLY): 88 SLP Stop Time (ACUTE ONLY): 1400 SLP Time Calculation (min) (ACUTE ONLY): 15 min  Past Medical History:  Past Medical History:  Diagnosis Date   Arthritis    CAD (coronary artery disease)    Cataract    Diabetes mellitus    GERD (gastroesophageal reflux disease)    Helicobacter pylori gastritis 01/2006, 03/2011   EGD - Pylera Tx 2010   Hiccups    History of kidney stones    Hyperlipidemia    Hypertension    PAD (peripheral artery disease) ([place])    Past Surgical History:  Past Surgical History:  Procedure Laterality Date   CATARACT EXTRACTION     right   COLONOSCOPY  2002, 2007, 06/09/2011   Dr. Evelina Bucy - [name], [place]: for FHx colon cancer, no adenomas; 2012: [facility] - normal   CORONARY ARTERY BYPASS GRAFT  05-07-2008   ENDARTERECTOMY FEMORAL Bilateral 03/23/2015   Procedure: ENDARTERECTOMY OF BILATERAL COMMON FEMORAL AND PROFUNDA ARTERIES;  Surgeon: Rosetta Posner, MD;  Location: [place];  Service: Vascular;  Laterality: Bilateral;   ESOPHAGOGASTRODUODENOSCOPY  02/20/06   Dr. Evelina Bucy, [name], [place]. pylori gastritis and GERD changes (pathology)   EYE SURGERY Left    cataract   HERNIA REPAIR  12/24/2010   INSERTION OF ILIAC STENT Bilateral 03/23/2015   Procedure: LEFT EXTERNAL ILIAC AND BILATERAL COMMON ILIAC STENT;  Surgeon: Rosetta Posner, MD;  Location: [facility] OR;  Service: Vascular;  Laterality: Bilateral;   JOINT REPLACEMENT     right knee replacement   PATCH ANGIOPLASTY Bilateral 03/23/2015   Procedure: PATCH ANGIOPLASTY OF BILATERAL COMMON FEMORAL ARTERIES;  Surgeon: Rosetta Posner, MD;  Location: [place];  Service: Vascular;  Laterality: Bilateral;   PERIPHERAL VASCULAR CATHETERIZATION N/A 03/11/2015   Procedure: Abdominal Aortogram w/Lower Extremity;  Surgeon: Rosetta Posner, MD;  Location: [place] [facility]  [facility];  Service: Cardiovascular;  Laterality: N/A;   TOTAL KNEE ARTHROPLASTY  07-13-2012   right   UPPER GASTROINTESTINAL ENDOSCOPY  03/09/2009   Dr. Carlean Purl: H. pylori gastritis (Pylera Tx) and 2 cm hiatal hernia   HPI:  86 year old male with past medical history of dementia, peripheral vascular disease, hypertension, gastroesophageal reflux disease, non-insulin-dependent diabetes mellitus type 2, chronic kidney disease stage IIIa, hyperlipidemia, chronic hyponatremia, coronary artery disease status post CABG who presents [facility] emergency department via EMS due to progressively worsening weakness, lethargy and inability to ambulate.  Patient with a chest xray that was showing left lower lobe infiltrate concerning for PNA.  PNA if felt to be reason for patient's severe weakness.   Assessment / Plan / Recommendation Clinical Impression  Clinical swallowing evaluation was completed using thin liquids via spoon, cup and straw, pureed material and solids from his lunch tray.  The patient's wife was present and reported some swallowing issues with the patient getting "choked" and having a vomitting episode.   Limited cranial nerve exam was completed due to his issues following all presented commands. Lingual deviation to the left was seen with movement.  Otherwise range of motion of the lips, face and jaw appeared adequate.  Strength and sensation were unable to be fully assessed.  He presented with a likely oral dysphagia that was partly impacted by his mentation.  Mastication of soft and regular solids was slow with oral residue seen post swallow.  He did appear  sensate to this but required cues to clear all material from his oral cavity needing a liquid wash at times.  He also tended to put more stuff into his mouth prior to clearing previous bite.  Encouraged his wife to assist him with controlling rate of intake.  Swallow trigger was appreciated to palpation and overt s/s of aspiration were  not seen.  Suggest he remain on a regular textured diet with thin liquids with his wife selecting softer, easier to chew items from the menu.  ST will follow for therapeutic diet tolerance. SLP Visit Diagnosis: Dysphagia, oral phase (R13.11)    Aspiration Risk  Mild aspiration risk    Diet Recommendation Regular textured foods with wife selecting easier, softer to chew menu items and thin liquids.    Medication Administration: Whole meds with liquid    Other  Recommendations Oral Care Recommendations: Oral care BID   Follow up Recommendations Other (comment) (TBD)      Frequency and Duration min 2x/week  2 weeks       Prognosis Barriers to Reach Goals: Cognitive deficits      Swallow Study   General Date of Onset: 07/09/21 HPI: 86 year old male with past medical history of dementia, peripheral vascular disease, hypertension, gastroesophageal reflux disease, non-insulin-dependent diabetes mellitus type 2, chronic kidney disease stage IIIa, hyperlipidemia, chronic hyponatremia, coronary artery disease status post CABG who presents to [facility] hospital emergency department via EMS due to progressively worsening weakness, lethargy and inability to ambulate.  Patient with a chest xray that was showing left lower lobe infiltrate concerning for PNA.  PNA if felt to be reason for patient's severe weakness. Type of Study: Bedside Swallow Evaluation Previous Swallow Assessment: None noted at [facility]. Diet Prior to this Study: Regular;Thin liquids Temperature Spikes Noted: No Respiratory Status: Room air History of Recent Intubation: No Behavior/Cognition: Alert;Cooperative;Confused Oral Cavity Assessment: Within Functional Limits Oral Care Completed by SLP: No Oral Cavity - Dentition: Dentures, top;Dentures, bottom;Dentures, not available Vision: Functional for self-feeding Self-Feeding Abilities: Able to feed self Patient Positioning: Upright in bed Baseline Vocal Quality:  Normal Volitional Swallow: Able to elicit    Oral/Motor/Sensory Function Overall Oral Motor/Sensory Function: Other (comment) (limited assessment)   Ice Chips Ice chips: Not tested   Thin Liquid Thin Liquid: Within functional limits Presentation: Cup;Spoon;Straw;Self Fed    Nectar Thick Nectar Thick Liquid: Not tested   Honey Thick Honey Thick Liquid: Not tested   Puree Puree: Impaired Presentation: Spoon;Self Fed Oral Phase Impairments: Impaired mastication Oral Phase Functional Implications: Prolonged oral transit   Solid     Solid: Impaired Presentation: Self Fed Oral Phase Impairments: Impaired mastication Oral Phase Functional Implications: Oral residue;Impaired mastication     Shelly Flatten, MA, CCC-SLP Acute Rehab SLP [phone]  Lamar Sprinkles 07/11/2021,2:02 PM

## 2021-07-12 LAB — BASIC METABOLIC PANEL
Anion gap: 9 (ref 5–15)
BUN: 18 mg/dL (ref 8–23)
CO2: 25 mmol/L (ref 22–32)
Calcium: 9 mg/dL (ref 8.9–10.3)
Chloride: 98 mmol/L (ref 98–111)
Creatinine, Ser: 1.14 mg/dL (ref 0.61–1.24)
GFR, Estimated: >60 mL/min (ref >60)
Glucose, Bld: 87 mg/dL (ref 70–99)
Potassium: 4.3 mmol/L (ref 3.5–5.1)
Sodium: 132 mmol/L — ABNORMAL LOW (ref 135–145)

## 2021-07-12 LAB — GLUCOSE, CAPILLARY
Glucose-Capillary: 93 mg/dL (ref 70–99)
Glucose-Capillary: 163 mg/dL — ABNORMAL HIGH (ref 70–99)
Glucose-Capillary: 135 mg/dL — ABNORMAL HIGH (ref 70–99)
Glucose-Capillary: 198 mg/dL — ABNORMAL HIGH (ref 70–99)

## 2021-07-12 NOTE — Progress Notes (Signed)
  Speech Language Pathology Treatment: Dysphagia  Patient Details Name: [name] MRN: [id number] DOB: Mar 08, 1935 Today's Date: 07/12/2021 Time: 1224-8250 SLP Time Calculation (min) (ACUTE ONLY): 15 min  Assessment / Plan / Recommendation Clinical Impression  SLP spoke with patient's wife with patient present but not attempting to participate. SLP observed patient to have clean and moist oral cavity. Per spouse, patient ate a little bit at lunch and she found that cutting food up into smaller bites helps. SLP reviewed swallow precautions such as small bites/sips, ensuring patient is adequately masticating solids, checking periodically for pocketing or holding of PO's in mouth, recommending oral care after PO intake. Spouse verbalized understanding of education. She reported that plan is for patient to return to ALF (very close to their home as facility property just on other side of their backyard). This ALF apparently has levels of care, with level 5 being much like SNF level. SLP is not recommending further intervention as education is complete and patient is consuming PO's with swallow precautions.   HPI HPI: 86 year old male with past medical history of dementia, peripheral vascular disease, hypertension, gastroesophageal reflux disease, non-insulin-dependent diabetes mellitus type 2, chronic kidney disease stage IIIa, hyperlipidemia, chronic hyponatremia, coronary artery disease status post CABG who presents to [facility] long hospital emergency department via EMS due to progressively worsening weakness, lethargy and inability to ambulate.  Patient with a chest xray that was showing left lower lobe infiltrate concerning for PNA.  PNA if felt to be reason for patient's severe weakness.      SLP Plan  Discharge SLP treatment due to (comment) (goals met, education complete)       Recommendations  Diet recommendations: Regular Liquids provided via: Cup;Straw Medication Administration: Whole meds  with puree Supervision: Full supervision/cueing for compensatory strategies;Staff to assist with self feeding Compensations: Minimize environmental distractions;Small sips/bites;Slow rate;Lingual sweep for clearance of pocketing Postural Changes and/or Swallow Maneuvers: Seated upright 90 degrees                Oral Care Recommendations: Oral care BID;Other (Comment) (oral care after PO's) Follow up Recommendations: 24 hour supervision/assistance;Other (comment) (will be getting high level of supervision/support at ALF) SLP Visit Diagnosis: Dysphagia, oral phase (R13.11) Plan: Discharge SLP treatment due to (comment) (goals met, education complete)       Sonia Baller, MA, CCC-SLP Speech Therapy

## 2021-07-12 NOTE — Progress Notes (Signed)
Physical Therapy Treatment Patient Details Name: [name] MRN: [id number] DOB: Jul 05, 1935 Today's Date: 07/12/2021   History of Present Illness Pt is 86 yo male presenting with progressive weakness and inability to ambulate on 07/09/21. Pt found to have PNE with acute metabolic encephalopathy. Pt with hx of dementia, PVD, HTN, GERD, DM2, kidney disease, hyperlipidemia, hyponatremia, CAD s/p CABG.    PT Comments    Progressing with mobility. Continues to require +2 for safe mobility. He was able to walk ~10 feet on today. Multimodal cueing required-pt participated fairly well. Will continue to follow and progress activity as tolerated. Continue to recommend SNF unless ALF staff feels they can manage pt's care at current level .    Recommendations for follow up therapy are one component of a multi-disciplinary discharge planning process, led by the attending physician.  Recommendations may be updated based on patient status, additional functional criteria and insurance authorization.  Follow Up Recommendations  SNF (unless ALF staff feels they can manage pt's care at current level)     Equipment Recommendations  None recommended by PT    Recommendations for Other Services       Precautions / Restrictions Precautions Precautions: Fall Restrictions Weight Bearing Restrictions: No     Mobility  Bed Mobility Overal bed mobility: Needs Assistance Bed Mobility: Supine to Sit     Supine to sit: Mod assist;+2 for physical assistance;+2 for safety/equipment;HOB elevated     General bed mobility comments: Assist for trunk and bil LEs. Increased time. Pt used bedrail to assist. Cueing required.    Transfers Overall transfer level: Needs assistance Equipment used: Rolling walker (2 wheeled) Transfers: Sit to/from Stand Sit to Stand: Mod assist;+2 safety/equipment;From elevated surface;+2 physical assistance         General transfer comment: x2. Assist to power up, steady,  control descent. Posterior lean initially but pt was able to gain static standing balance to safely attempt ambulation.  Ambulation/Gait Ambulation/Gait assistance: Mod assist;+2 physical assistance;+2 safety/equipment Gait Distance (Feet): 10 Feet Assistive device: Rolling walker (2 wheeled) Gait Pattern/deviations: Step-to pattern;Step-through pattern;Trunk flexed;Decreased step length - right;Decreased step length - left     General Gait Details: Assist to steady/stabilize pt and manage RW. Intermittent stopping/distraction but he was able to be redirected. Followed closely with recliner. Cueing required.   Stairs             Wheelchair Mobility    Modified Rankin (Stroke Patients Only)       Balance Overall balance assessment: Needs assistance         Standing balance support: Bilateral upper extremity supported Standing balance-Leahy Scale: Poor                              Cognition Arousal/Alertness: Awake/alert Behavior During Therapy: Flat affect Overall Cognitive Status: History of cognitive impairments - at baseline                                 General Comments: Oriented to self. Follow simple commands with time and sometimes repeated cueing      Exercises Total Joint Exercises Heel Slides: AROM;AAROM;Both;5 reps;Supine    General Comments        Pertinent Vitals/Pain Pain Assessment: No/denies pain    Home Living  Prior Function            PT Goals (current goals can now be found in the care plan section) Progress towards PT goals: Progressing toward goals    Frequency    Min 2X/week      PT Plan Current plan remains appropriate    Co-evaluation              AM-PAC PT "6 Clicks" Mobility   Outcome Measure  Help needed turning from your back to your side while in a flat bed without using bedrails?: Total Help needed moving from lying on your back to sitting on  the side of a flat bed without using bedrails?: Total Help needed moving to and from a bed to a chair (including a wheelchair)?: Total Help needed standing up from a chair using your arms (e.g., wheelchair or bedside chair)?: Total Help needed to walk in hospital room?: Total Help needed climbing 3-5 steps with a railing? : Total 6 Click Score: 6    End of Session Equipment Utilized During Treatment: Gait belt Activity Tolerance: Patient tolerated treatment well Patient left: in chair;with call bell/phone within reach;with family/visitor present;with chair alarm set   PT Visit Diagnosis: Muscle weakness (generalized) (M62.81);Difficulty in walking, not elsewhere classified (R26.2)     Time: [id number] PT Time Calculation (min) (ACUTE ONLY): 16 min  Charges:  $Gait Training: 8-22 mins                         Doreatha Massed, PT Acute Rehabilitation  Office: [phone] Pager: [phone]

## 2021-07-12 NOTE — Progress Notes (Signed)
PROGRESS NOTE    [name]  [id number] DOB: [birth year] DOA: 07/09/2021 PCP: Ginger Organ., MD    Chief Complaint  Patient presents with   Weakness    Brief Narrative:  History of non-insulin-dependent type diabetes,, CAD status post CABG, PAD status post bilateral lower extremity arterial angioplasty and stenting in 2016, CKD 3B presented to the hospital due to weakness, acute encephalopathy, found to have lobar pneumonia and hyponatremia  Subjective:  Happy to see wife this AM  Assessment & Plan:   Principal Problem:   Acute metabolic encephalopathy Active Problems:   Essential hypertension   Type 2 diabetes mellitus with stage 3a chronic kidney disease, without long-term current use of insulin (HCC)   Pneumonia of left lower lobe due to infectious organism   GERD without esophagitis   Chronic kidney disease, stage 3a ([place])   Dementia without behavioral disturbance ([place])   Coronary artery disease involving native heart without angina pectoris    Acute metabolic encephalopathy/failure to thrive -ct head no acute findings, showed "Overall unchanged parenchymal volume loss and chronic white matter microangiopathy" -Confusion and weakness likely due to pneumonia/also has hyponatremia -Delirium protocol, family will stay with patient as well -slightly improved - PT eval recommended skilled nursing facility placement, social worker to check with current ALF ( [facility] ) to see if they can take him, PPD placed on 9/10 pm per family /ALF request  Lobar pneumonia Chest x-ray with left lower lobe pneumonia, blood culture in process urine strep pneumo antigen negative  Is started on Rocephin and Zithromax in the ED, continue Wife reports him strangle /chocking at times, he vomited twice in the last two weeks But appetite is good  -seen by speech- regular diet  Hyponatremia  serum osmolality ,urine sodium and urine osmole suggest SIADH TSH within normal limit a.m.  cortisol level 5.4 -fluid restrict  AKI on CKD 3B UA unremarkable Cr 1.79 on presentation   Non-insulin-dependent type 2 diabetes, controlled  A1c 6.3 Hold home medication metformin Started on SSI here  Hypertension Continue metoprolol, hold ramipril for now due to AKI  CAD, h/o CABG in 2009 Denies chest pain Continue metoprolol, Plavix -not on statin  PAD, status post bilateral lower extremity arterial angioplasty and stenting in 2016 Continue Plavix -not on statin  Dementia/FTT To ALF vs SNF PPD placed on 9/10 pm per family/ALF request Social worker following    DVT prophylaxis: enoxaparin (LOVENOX) injection 40 mg Start: 07/10/21 1000   Code Status: DNR Family Communication: Wife at bedside Disposition:   Status is: Inpatient   Dispo: The patient is from: Home              Anticipated d/c is to: ALF vs SNF                          Consultants:  None   Antimicrobials:    Anti-infectives (From admission, onward)    Start     Dose/Rate Route Frequency Ordered Stop   07/10/21 2230  cefTRIAXone (ROCEPHIN) 2 g in sodium chloride 0.9 % 100 mL IVPB        2 g 200 mL/hr over 30 Minutes Intravenous Every 24 hours 07/10/21 0050 07/15/21 2229   07/10/21 2230  azithromycin (ZITHROMAX) 500 mg in sodium chloride 0.9 % 250 mL IVPB        500 mg 250 mL/hr over 60 Minutes Intravenous Every 24 hours 07/10/21 0050 07/15/21 2229  07/09/21 2230  cefTRIAXone (ROCEPHIN) 1 g in sodium chloride 0.9 % 100 mL IVPB        1 g 200 mL/hr over 30 Minutes Intravenous  Once 07/09/21 2218 07/09/21 2332   07/09/21 2230  azithromycin (ZITHROMAX) 500 mg in sodium chloride 0.9 % 250 mL IVPB        500 mg 250 mL/hr over 60 Minutes Intravenous  Once 07/09/21 2218 07/10/21 0034          Objective: Vitals:   07/10/21 2017 07/11/21 0434 07/11/21 2010 07/12/21 0522  BP: 136/68 (!) 141/70 (!) 155/84 (!) 154/89  Pulse: 66 73 62 (!) 53  Resp: '16 16 12 16  '$ Temp: 97.6 F (36.4 C)  97.8 F (36.6 C) 98 F (36.7 C) 98.5 F (36.9 C)  TempSrc: Oral Oral Oral Oral  SpO2: 100% 99% 99% 100%  Weight:      Height:       No intake or output data in the 24 hours ending 07/12/21 1408  Filed Weights   07/09/21 1202  Weight: 87.1 kg    Examination:   General: Appearance:    elderlymale in no acute distress     Lungs:     respirations unlabored  Heart:    Bradycardic. Normal rhythm. No murmurs, rubs, or gallops.   MS:   All extremities are intact.    Neurologic:   Awake, alert        Data Reviewed: I have personally reviewed following labs and imaging studies  CBC: Recent Labs  Lab 07/09/21 1510 07/10/21 0030  WBC 8.4 8.1  NEUTROABS 5.3 4.7  HGB 14.2 11.8*  HCT 42.0 34.3*  MCV 89.2 86.8  PLT 161 [id number]    Basic Metabolic Panel: Recent Labs  Lab 07/09/21 1510 07/10/21 0030 07/11/21 0537 07/12/21 0457  NA 130* 130* 132* 132*  K 5.0 4.6 4.5 4.3  CL 94* 95* 98 98  CO2 '25 24 22 25  '$ GLUCOSE 101* 104* 82 87  BUN 35* 31* 22 18  CREATININE 1.79* 1.54* 1.35* 1.14  CALCIUM 9.3 8.7* 8.7* 9.0  MG  --  1.7 1.7  --     GFR: Estimated Creatinine Clearance: 56.1 mL/min (by C-G formula based on SCr of 1.14 mg/dL).  Liver Function Tests: Recent Labs  Lab 07/09/21 1510 07/10/21 0030  AST 22 19  ALT 14 12  ALKPHOS 68 59  BILITOT 1.0 0.8  PROT 7.6 6.6  ALBUMIN 4.4 3.5    CBG: Recent Labs  Lab 07/11/21 1111 07/11/21 1636 07/11/21 2012 07/12/21 0755 07/12/21 1214  GLUCAP 155* 129* 153* 93 163*     Recent Results (from the past 240 hour(s))  Resp Panel by RT-PCR (Flu A&B, Covid) Nasopharyngeal Swab     Status: None   Collection Time: 07/09/21  3:59 PM   Specimen: Nasopharyngeal Swab; Nasopharyngeal(NP) swabs in vial transport medium  Result Value Ref Range Status   SARS Coronavirus 2 by RT PCR NEGATIVE NEGATIVE Final    Comment: (NOTE) SARS-CoV-2 target nucleic acids are NOT DETECTED.  The SARS-CoV-2 RNA is generally detectable in upper  respiratory specimens during the acute phase of infection. The lowest concentration of SARS-CoV-2 viral copies this assay can detect is 138 copies/mL. A negative result does not preclude SARS-Cov-2 infection and should not be used as the sole basis for treatment or other patient management decisions. A negative result may occur with  improper specimen collection/handling, submission of specimen other than nasopharyngeal swab, presence of viral  mutation(s) within the areas targeted by this assay, and inadequate number of viral copies(<138 copies/mL). A negative result must be combined with clinical observations, patient history, and epidemiological information. The expected result is Negative.  Fact Sheet for Patients:  EntrepreneurPulse.com.au  Fact Sheet for Healthcare Providers:  IncredibleEmployment.be  This test is no t yet approved or cleared by the [place] FDA and  has been authorized for detection and/or diagnosis of SARS-CoV-2 by FDA under an Emergency Use Authorization (EUA). This EUA will remain  in effect (meaning this test can be used) for the duration of the COVID-19 declaration under Section 564(b)(1) of the Act, 21 U.S.C.section 360bbb-3(b)(1), unless the authorization is terminated  or revoked sooner.       Influenza A by PCR NEGATIVE NEGATIVE Final   Influenza B by PCR NEGATIVE NEGATIVE Final    Comment: (NOTE) The Xpert Xpress SARS-CoV-2/FLU/RSV plus assay is intended as an aid in the diagnosis of influenza from Nasopharyngeal swab specimens and should not be used as a sole basis for treatment. Nasal washings and aspirates are unacceptable for Xpert Xpress SARS-CoV-2/FLU/RSV testing.  Fact Sheet for Patients: EntrepreneurPulse.com.au  Fact Sheet for Healthcare Providers: IncredibleEmployment.be  This test is not yet approved or cleared by the [place] FDA and has been  authorized for detection and/or diagnosis of SARS-CoV-2 by FDA under an Emergency Use Authorization (EUA). This EUA will remain in effect (meaning this test can be used) for the duration of the COVID-19 declaration under Section 564(b)(1) of the Act, 21 U.S.C. section 360bbb-3(b)(1), unless the authorization is terminated or revoked.  Performed at [facility], [place] [address] [place], [place] [address]   Culture, blood (routine x 2) Call MD if unable to obtain prior to antibiotics being given     Status: None (Preliminary result)   Collection Time: 07/10/21  2:05 AM   Specimen: BLOOD  Result Value Ref Range Status   Specimen Description   Final    BLOOD RIGHT ANTECUBITAL Performed at [facility] [place] [address], [place], [place] [address]    Special Requests   Final    BOTTLES DRAWN AEROBIC ONLY Performed at [facility] [address], [place], [place] [address]    Culture   Final    NO GROWTH 1 DAY Performed at [facility], [place] [address], [place], [place] [address]    Report Status PENDING  Incomplete  Culture, blood (routine x 2) Call MD if unable to obtain prior to antibiotics being given     Status: None (Preliminary result)   Collection Time: 07/10/21  2:05 AM   Specimen: BLOOD  Result Value Ref Range Status   Specimen Description   Final    BLOOD BLOOD RIGHT HAND Performed at [facility] [address], [place], [place] [address]    Special Requests   Final    BOTTLES DRAWN AEROBIC ONLY Performed at [facility] [address] [place], [place] [address]    Culture   Final    NO GROWTH 1 DAY Performed at [facility], [place] [address], [place],  [address]    Report Status PENDING  Incomplete         Radiology Studies: No results found.      Scheduled Meds:  clopidogrel  75 mg Oral Q supper   enoxaparin (LOVENOX) injection  40 mg  Subcutaneous Q24H   insulin aspart  0-15 Units Subcutaneous TID  AC & HS   metoprolol succinate  25 mg Oral Q supper   pantoprazole  40 mg Oral QAC supper   tuberculin  5 Units Intradermal Once   Continuous Infusions:  azithromycin 500 mg (07/11/21 2207)   cefTRIAXone (ROCEPHIN)  IV 2 g (07/11/21 2116)     LOS: 3 days   Time spent: 35 mins Greater than 50% of this time was spent in counseling, explanation of diagnosis, planning of further management, and coordination of care.    Geradine Girt, DO [facility]  Available via Epic secure chat 7am-7pm for nonurgent issues Please page for urgent issues To page the attending provider between 7A-7P or the covering provider during after hours 7P-7A, please log into the web site [email or web address] and access using universal [place] password for that web site. If you do not have the password, please call the hospital operator.    07/12/2021, 2:08 PM

## 2021-07-13 LAB — BASIC METABOLIC PANEL
Anion gap: 10 (ref 5–15)
BUN: 17 mg/dL (ref 8–23)
CO2: 25 mmol/L (ref 22–32)
Calcium: 9 mg/dL (ref 8.9–10.3)
Chloride: 96 mmol/L — ABNORMAL LOW (ref 98–111)
Creatinine, Ser: 1.08 mg/dL (ref 0.61–1.24)
GFR, Estimated: >60 mL/min (ref >60)
Glucose, Bld: 74 mg/dL (ref 70–99)
Potassium: 4.1 mmol/L (ref 3.5–5.1)
Sodium: 131 mmol/L — ABNORMAL LOW (ref 135–145)

## 2021-07-13 LAB — GLUCOSE, CAPILLARY
Glucose-Capillary: 92 mg/dL (ref 70–99)
Glucose-Capillary: 141 mg/dL — ABNORMAL HIGH (ref 70–99)
Glucose-Capillary: 172 mg/dL — ABNORMAL HIGH (ref 70–99)
Glucose-Capillary: 95 mg/dL (ref 70–99)

## 2021-07-13 LAB — RESP PANEL BY RT-PCR (FLU A&B, COVID) ARPGX2
Influenza A by PCR: NEGATIVE
Influenza B by PCR: NEGATIVE
SARS Coronavirus 2 by RT PCR: NEGATIVE

## 2021-07-13 MED ORDER — MAGNESIUM OXIDE -MG SUPPLEMENT 400 (240 MG) MG PO TABS
400.0000 mg | ORAL_TABLET | Freq: Every day | ORAL | Status: DC
  Administered 2021-07-13: 400 mg via ORAL
  Filled 2021-07-13: qty 1

## 2021-07-13 MED ORDER — METFORMIN HCL 1000 MG PO TABS: 1000.0000 mg | ORAL_TABLET | Freq: Every day | ORAL | Status: AC

## 2021-07-13 MED ORDER — MAGNESIUM OXIDE -MG SUPPLEMENT 400 (240 MG) MG PO TABS: 400.0000 mg | ORAL_TABLET | Freq: Every day | ORAL | Status: AC

## 2021-07-13 MED ORDER — POLYETHYLENE GLYCOL 3350 17 G PO PACK: 17.0000 g | PACK | Freq: Every day | ORAL | 0 refills | Status: AC | PRN

## 2021-07-13 MED ORDER — DOXYCYCLINE HYCLATE 100 MG PO TABS
100.0000 mg | ORAL_TABLET | Freq: Two times a day (BID) | ORAL | Status: DC
  Administered 2021-07-13 (×2): 100 mg via ORAL
  Filled 2021-07-13 (×2): qty 1

## 2021-07-13 MED ORDER — DOXYCYCLINE HYCLATE 100 MG PO TABS: 100.0000 mg | ORAL_TABLET | Freq: Two times a day (BID) | ORAL | 0 refills | Status: DC

## 2021-07-13 NOTE — Progress Notes (Signed)
TB skin test negative.

## 2021-07-13 NOTE — Discharge Summary (Addendum)
Physician Discharge Summary  [name] [id number] DOB: 08-05-1935 DOA: 07/09/2021  PCP: Ginger Organ., MD  Admit date: 07/09/2021 Discharge date: 07/13/2021  Admitted From: home Discharge disposition: alf   Recommendations for Outpatient Follow-Up:   Home health PT Doxycycline for 2 more days Bowel regimen to avoid constipation abdominal aorta 4.1 x 3.2 cm; recommend follow-up every 12 months and vascular referral  Ramipril on hold-- can resume if needed for BP control    Discharge Diagnosis:   Principal Problem:   Acute metabolic encephalopathy Active Problems:   Essential hypertension   Type 2 diabetes mellitus with stage 3a chronic kidney disease, without long-term current use of insulin (HCC)   Pneumonia of left lower lobe due to infectious organism   GERD without esophagitis   Chronic kidney disease, stage 3a ([place])   Dementia without behavioral disturbance ([place])   Coronary artery disease involving native heart without angina pectoris    Discharge Condition: Improved.  Diet recommendation: Carbohydrate-modified  Wound care: None.  Code status: Full.   History of Present Illness:   86 year old male with past medical history of dementia, peripheral vascular disease, hypertension, gastroesophageal reflux disease, non-insulin-dependent diabetes mellitus type 2, chronic kidney disease stage IIIa, hyperlipidemia, chronic hyponatremia, coronary artery disease status post CABG who presents to [facility] long hospital emergency department via EMS due to progressively worsening weakness, lethargy and inability to ambulate.  Patient is an extremely poor historian therefore the majority the history is been obtained from his wife via phone conversation as well as review of triage notes and discussion with emergency department staff.   Family explains that patient is typically able to ambulate with a walker at baseline.  For the past 3 days however the patient has  been experiencing rapidly progressive weakness.  This weakness is severe worse with exertion and improved with rest.  Upon further questioning patient has not been experiencing any shortness of breath, cough, fevers, dysuria, pain, diarrhea.  Patient has not undergone any recent travel, has no sick contacts and no contact with confirmed COVID-19 infection.  Patient's weakness got so severe the patient has been unable to ambulate whatsoever and therefore EMS was contacted by the family who rapidly came to evaluate the patient and brought the patient into [facility] long hospital emergency department for further evaluation.  Upon evaluation in the emergency department CT imaging of the head was unremarkable.  Urinalysis was found to be unremarkable.  Chest x-ray was found to identify a left lower lobe infiltrate concerning for pneumonia.  Pneumonia is felt to be the cause of the patient's severe weakness.  While patient is not requiring oxygen ER provider is requesting hospitalization due to profound weakness and inability to ambulate with pneumonia as the suspected etiology.  Patient has been administered a dose of ceftriaxone and azithromycin.  The hospitalist group has now been called to assess the patient for admission the hospital.     Hospital Course by Problem:   Acute metabolic encephalopathy/failure to thrive -ct head no acute findings, showed "Overall unchanged parenchymal volume loss and chronic white matter microangiopathy" -Confusion and weakness likely due to pneumonia/also has hyponatremia -Delirium protocol -improved-- plan to d/c to ALF   Lobar pneumonia Chest x-ray with left lower lobe pneumonia urine strep pneumo antigen negative  Wife reports him strangle /chocking at times, he vomited twice in the last two weeks But appetite is good  -seen by speech- regular diet   Hyponatremia  serum osmolality ,urine  sodium and urine osmole suggest SIADH -baseline Na appears to be 130    AKI on CKD 3B UA unremarkable Cr 1.79 on presentation- much improved   Non-insulin-dependent type 2 diabetes, controlled  A1c 6.3 Hold home medication metformin -resume home meds   Hypertension Continue metoprolol, hold ramipril for now   CAD, h/o CABG in 2009 Denies chest pain Continue metoprolol, Plavix -not on statin   PAD, status post bilateral lower extremity arterial angioplasty and stenting in 2016 Continue Plavix -not on statin   Dementia/FTT To ALF  PPD placed on 9/10 negative       Medical Consultants:   SLP   Discharge Exam:   Vitals:   07/12/21 2009 07/13/21 0502  BP: (!) 155/76 130/70  Pulse: 65 67  Resp: 18 20  Temp: 97.8 F (36.6 C) 97.8 F (36.6 C)  SpO2: 100% 98%   Vitals:   07/11/21 2010 07/12/21 0522 07/12/21 2009 07/13/21 0502  BP: (!) 155/84 (!) 154/89 (!) 155/76 130/70  Pulse: 62 (!) 53 65 67  Resp: '12 16 18 20  '$ Temp: 98 F (36.7 C) 98.5 F (36.9 C) 97.8 F (36.6 C) 97.8 F (36.6 C)  TempSrc: Oral Oral Axillary Oral  SpO2: 99% 100% 100% 98%  Weight:      Height:        General exam: Appears calm and comfortable.     The results of significant diagnostics from this hospitalization (including imaging, microbiology, ancillary and laboratory) are listed below for reference.     Procedures and Diagnostic Studies:   CT ABDOMEN PELVIS WO CONTRAST  Result Date: 07/09/2021 CLINICAL DATA:  Nausea, vomiting, weakness EXAM: CT ABDOMEN AND PELVIS WITHOUT CONTRAST TECHNIQUE: Multidetector CT imaging of the abdomen and pelvis was performed following the standard protocol without IV contrast. IV contrast not administered due to impaired renal function. Sagittal and coronal MPR images reconstructed from axial data set. COMPARISON:  06/15/2012 FINDINGS: Lower chest: Emphysematous and bronchitic changes at lung bases. Interstitial lung disease changes LEFT lower lobe, minimally RIGHT base, progressive since 2013. Thickening at base of LEFT  major fissure. Hepatobiliary: Gallbladder and liver normal appearance Pancreas: Atrophic without mass Spleen: Normal appearance Adrenals/Urinary Tract: Small nonobstructing calculus at inferior pole LEFT kidney. Adrenal glands, kidneys, ureters, and bladder otherwise normal appearance. Stomach/Bowel: Normal appendix, retrocecal. Increased stool throughout colon and rectum. Stomach and small bowel loops normal appearance. No evidence of bowel obstruction or wall thickening. Vascular/Lymphatic: Extensive atherosclerotic calcifications aorta, iliac arteries, femoral arteries, visceral arteries, and coronary arteries. Fusiform aneurysmal dilatation mid abdominal aorta 4.1 x 3.2 cm previously 3.8 x 3.0 cm. Suspected high-grade narrowings of the common iliac arteries bilaterally. Stent identified RIGHT external iliac artery, suspect LEFT external iliac artery as well. No adenopathy. Reproductive: Minimal prostatic enlargement. Seminal vesicles unremarkable. Other: Small LEFT inguinal hernia containing fat. No free air or free fluid. No inflammatory process. Musculoskeletal: Osseous demineralization with degenerative disc and facet disease changes lumbar spine. BILATERAL spondylolysis L4 with grade 2 spondylolisthesis L4-L5. IMPRESSION: Increased stool throughout colon and rectum. Small LEFT inguinal hernia containing fat. Extensive atherosclerotic disease changes with fusiform aneurysmal dilatation of mid abdominal aorta 4.1 x 3.2 cm; recommend follow-up every 12 months and vascular consultation. This recommendation follows ACR consensus guidelines: White Paper of the ACR Incidental Findings Committee II on Vascular Findings. J Am Coll Radiol 2013; 10:789-794. Suspected high-grade stenoses of the common iliac arteries bilaterally. Small nonobstructing LEFT renal calculus. BILATERAL spondylolysis L4 with grade 2 spondylolisthesis L4-L5. Emphysematous changes  with progressive interstitial lung disease at lung bases  particularly on LEFT. Emphysema (ICD10-J43.9). Aortic Atherosclerosis (ICD10-I70.0). Aortic aneurysm NOS (ICD10-I71.9). Electronically Signed   By: Lavonia Dana M.D.   On: 07/09/2021 19:01   CT HEAD WO CONTRAST (5MM)  Result Date: 07/09/2021 CLINICAL DATA:  Delirium EXAM: CT HEAD WITHOUT CONTRAST TECHNIQUE: Contiguous axial images were obtained from the base of the skull through the vertex without intravenous contrast. COMPARISON:  Brain MRA 03/23/2020, brain MRI 02/11/2020 FINDINGS: Image quality is degraded by motion artifact. Brain: There is no evidence of acute intracranial hemorrhage, extra-axial fluid collection, or infarct. There is global parenchymal volume loss with commensurate enlargement of the ventricular system, similar to the prior MRI. There is confluent hypodensity in the subcortical and periventricular white matter likely reflecting sequela of advanced chronic white matter microangiopathy. There is no mass lesion. There is no midline shift. Vascular: There is calcification of the bilateral cavernous ICAs. Skull: Normal. Negative for definite fracture or focal lesion. Sinuses/Orbits: The imaged paranasal sinuses are clear. Bilateral lens implants are in place. The globes and orbits are otherwise unremarkable. Other: None. IMPRESSION: 1. No acute intracranial pathology, within the confines of motion degraded images. 2. Overall unchanged parenchymal volume loss and chronic white matter microangiopathy. 3 Electronically Signed   By: Valetta Mole M.D.   On: 07/09/2021 15:17   DG Chest Portable 1 View  Result Date: 07/09/2021 CLINICAL DATA:  Weakness, generalized weakness for 3 days, unable to walk due to bilateral leg weakness. EXAM: PORTABLE CHEST 1 VIEW COMPARISON:  CT of the chest abdomen and pelvis from 2016. FINDINGS: Post median sternotomy for CABG.  EKG leads project over the chest. Cardiomediastinal contours with mild cardiac enlargement, accentuated by portable technique and low lung  volumes. No lobar consolidative process. There is however patchy opacities overlying the LEFT hemidiaphragm and LEFT heart border and increased density in the retrocardiac region. No pneumothorax. On limited assessment there is no acute skeletal process. IMPRESSION: Findings suspicious for developing LEFT lower lobe pneumonia. Post median sternotomy for CABG. Electronically Signed   By: Zetta Bills M.D.   On: 07/09/2021 14:20     Labs:   Basic Metabolic Panel: Recent Labs  Lab 07/09/21 1510 07/10/21 0030 07/11/21 0537 07/12/21 0457 07/13/21 0421  NA 130* 130* 132* 132* 131*  K 5.0 4.6 4.5 4.3 4.1  CL 94* 95* 98 98 96*  CO2 '25 24 22 25 25  '$ GLUCOSE 101* 104* 82 87 74  BUN 35* 31* '22 18 17  '$ CREATININE 1.79* 1.54* 1.35* 1.14 1.08  CALCIUM 9.3 8.7* 8.7* 9.0 9.0  MG  --  1.7 1.7  --   --    GFR Estimated Creatinine Clearance: 59.2 mL/min (by C-G formula based on SCr of 1.08 mg/dL). Liver Function Tests: Recent Labs  Lab 07/09/21 1510 07/10/21 0030  AST 22 19  ALT 14 12  ALKPHOS 68 59  BILITOT 1.0 0.8  PROT 7.6 6.6  ALBUMIN 4.4 3.5   No results for input(s): LIPASE, AMYLASE in the last 168 hours. Recent Labs  Lab 07/10/21 0907  AMMONIA 21   Coagulation profile No results for input(s): INR, PROTIME in the last 168 hours.  CBC: Recent Labs  Lab 07/09/21 1510 07/10/21 0030  WBC 8.4 8.1  NEUTROABS 5.3 4.7  HGB 14.2 11.8*  HCT 42.0 34.3*  MCV 89.2 86.8  PLT 161 168   Cardiac Enzymes: No results for input(s): CKTOTAL, CKMB, CKMBINDEX, TROPONINI in the last 168 hours. BNP: Invalid  input(s): POCBNP CBG: Recent Labs  Lab 07/12/21 0755 07/12/21 1214 07/12/21 1650 07/12/21 2012 07/13/21 0719  GLUCAP 93 163* 135* 198* 92   D-Dimer No results for input(s): DDIMER in the last 72 hours. Hgb A1c No results for input(s): HGBA1C in the last 72 hours. Lipid Profile No results for input(s): CHOL, HDL, LDLCALC, TRIG, CHOLHDL, LDLDIRECT in the last 72  hours. Thyroid function studies No results for input(s): TSH, T4TOTAL, T3FREE, THYROIDAB in the last 72 hours.  Invalid input(s): FREET3 Anemia work up No results for input(s): VITAMINB12, FOLATE, FERRITIN, TIBC, IRON, RETICCTPCT in the last 72 hours. Microbiology Recent Results (from the past 240 hour(s))  Resp Panel by RT-PCR (Flu A&B, Covid) Nasopharyngeal Swab     Status: None   Collection Time: 07/09/21  3:59 PM   Specimen: Nasopharyngeal Swab; Nasopharyngeal(NP) swabs in vial transport medium  Result Value Ref Range Status   SARS Coronavirus 2 by RT PCR NEGATIVE NEGATIVE Final    Comment: (NOTE) SARS-CoV-2 target nucleic acids are NOT DETECTED.  The SARS-CoV-2 RNA is generally detectable in upper respiratory specimens during the acute phase of infection. The lowest concentration of SARS-CoV-2 viral copies this assay can detect is 138 copies/mL. A negative result does not preclude SARS-Cov-2 infection and should not be used as the sole basis for treatment or other patient management decisions. A negative result may occur with  improper specimen collection/handling, submission of specimen other than nasopharyngeal swab, presence of viral mutation(s) within the areas targeted by this assay, and inadequate number of viral copies(<138 copies/mL). A negative result must be combined with clinical observations, patient history, and epidemiological information. The expected result is Negative.  Fact Sheet for Patients:  EntrepreneurPulse.com.au  Fact Sheet for Healthcare Providers:  IncredibleEmployment.be  This test is no t yet approved or cleared by the [place] FDA and  has been authorized for detection and/or diagnosis of SARS-CoV-2 by FDA under an Emergency Use Authorization (EUA). This EUA will remain  in effect (meaning this test can be used) for the duration of the COVID-19 declaration under Section 564(b)(1) of the Act,  21 U.S.C.section 360bbb-3(b)(1), unless the authorization is terminated  or revoked sooner.       Influenza A by PCR NEGATIVE NEGATIVE Final   Influenza B by PCR NEGATIVE NEGATIVE Final    Comment: (NOTE) The Xpert Xpress SARS-CoV-2/FLU/RSV plus assay is intended as an aid in the diagnosis of influenza from Nasopharyngeal swab specimens and should not be used as a sole basis for treatment. Nasal washings and aspirates are unacceptable for Xpert Xpress SARS-CoV-2/FLU/RSV testing.  Fact Sheet for Patients: EntrepreneurPulse.com.au  Fact Sheet for Healthcare Providers: IncredibleEmployment.be  This test is not yet approved or cleared by the [place] FDA and has been authorized for detection and/or diagnosis of SARS-CoV-2 by FDA under an Emergency Use Authorization (EUA). This EUA will remain in effect (meaning this test can be used) for the duration of the COVID-19 declaration under Section 564(b)(1) of the Act, 21 U.S.C. section 360bbb-3(b)(1), unless the authorization is terminated or revoked.  Performed at [facility], [place] [address] [place], [place] [address]   Culture, blood (routine x 2) Call MD if unable to obtain prior to antibiotics being given     Status: None (Preliminary result)   Collection Time: 07/10/21  2:05 AM   Specimen: BLOOD  Result Value Ref Range Status   Specimen Description   Final    BLOOD RIGHT ANTECUBITAL Performed at [facility], [place] [address], [place], [place] [address]    Special Requests   Final    BOTTLES DRAWN AEROBIC ONLY Performed at [facility] [address], [place], [place] [address]    Culture   Final    NO GROWTH 3 DAYS Performed at [facility], [place] [address], [place], [place] [address]    Report Status PENDING  Incomplete  Culture, blood (routine x 2) Call MD if unable to obtain prior to antibiotics being given      Status: None (Preliminary result)   Collection Time: 07/10/21  2:05 AM   Specimen: BLOOD  Result Value Ref Range Status   Specimen Description   Final    BLOOD BLOOD RIGHT HAND Performed at [facility] [address] [place], [place] [address]    Special Requests   Final    BOTTLES DRAWN AEROBIC ONLY Performed at [facility] [address] [place], [place] [address]    Culture   Final    NO GROWTH 3 DAYS Performed at [facility], [place] [address] [place], [place] [address]    Report Status PENDING  Incomplete     Discharge Instructions:   Discharge Instructions     Diet Carb Modified   Complete by: As directed    Increase activity slowly   Complete by: As directed       Allergies as of 07/13/2021       Reactions   Atorvastatin Other (See Comments)   Muscle pain   Rosuvastatin Other (See Comments)   Muscle pain   Simvastatin Other (See Comments)   Muscle pain   Sulfonamide Derivatives Hives        Medication List     STOP taking these medications    ramipril 5 MG capsule Commonly known as: ALTACE       TAKE these medications    Aleve 220 MG tablet Generic drug: naproxen sodium Take 220-440 mg by mouth 2 (two) times daily as needed (for pain).   amoxicillin 500 MG capsule Commonly known as: AMOXIL Take 2,000 mg by mouth See admin instructions. Take 2,000 mg by mouth 30 minutes prior to dental appointments   clopidogrel 75 MG tablet Commonly known as: PLAVIX Take 75 mg by mouth daily with supper.   doxycycline 100 MG tablet Commonly known as: VIBRA-TABS Take 1 tablet (100 mg total) by mouth every 12 (twelve) hours.   magnesium oxide 400 (240 Mg) MG tablet Commonly known as: MAG-OX Take 1 tablet (400 mg total) by mouth daily. Start taking on: July 14, 2021   metFORMIN 1000 MG tablet Commonly known as: GLUCOPHAGE Take 1 tablet (1,000 mg total) by mouth daily with supper. What changed:  how much to take   metoprolol succinate 25 MG 24 hr tablet Commonly known as: TOPROL-XL Take 25 mg by mouth daily with supper.   OneTouch Delica Lancets [id number] Misc Pt to check BS daily  Dx: E11.49   OneTouch Verio test strip Generic drug: glucose blood Check sugars daily Dx: E11.49   pantoprazole 40 MG tablet Commonly known as: PROTONIX Take 40 mg by mouth daily before supper.   polyethylene glycol 17 g packet Commonly known as: MIRALAX / GLYCOLAX Take 17 g by mouth daily as needed for mild constipation.   Wheelchair Misc Dx: Muscle Weakness        Follow-up Information     Ginger Organ., MD Follow up in 1  week(s).   Specialty: Internal Medicine Why: BMP Contact information: [address] [place] [address] [phone]                  Time coordinating discharge: 35 min  Signed:  Geradine Girt DO  [facility] 07/13/2021, 11:32 AM

## 2021-07-13 NOTE — Progress Notes (Signed)
Report called and given to Olivia Mackie at [facility].

## 2021-07-13 NOTE — TOC Transition Note (Signed)
Transition of Care [facility]) - CM/SW Discharge Note  Patient Details  Name: [name] MRN: [id number] Date of Birth: 1935-04-01  Transition of Care [facility]) CM/SW Contact:  Sherie Don, LCSW Phone Number: 07/13/2021, 2:34 PM  Clinical Narrative: CSW spoke with [place] [facility] [place] [facility]. Per Varney Biles, she will need the FL2, discharge summary, COVID test results, and documentation for the negative PPD results. COVID test is negative. FL2 done. CSW faxed requested documents to [phone]. The number for report is [phone] and RN will ask for [facility]. PTAR scheduled. Discharge packet completed. CSW updated patient's sons, Claiborne Billings and Aaron Edelman, and Therapist, sports. TOC signing off.  Final next level of care: Assisted Living ([facility]) Barriers to Discharge: Continued Medical Work up  Patient Goals and CMS Choice Patient states their goals for this hospitalization and ongoing recovery are:: Discharge to [facility] Medicare.gov Compare Post Acute Care list provided to:: Patient Represenative (must comment) Choice offered to / list presented to : Spouse  Discharge Placement        Patient chooses bed at: Other - please specify in the comment section below: ([facility]) Patient to be transferred to facility by: [place] Name of family member notified: Slevin Saurer (son) Patient and family notified of of transfer: 07/13/21  Discharge Plan and Services Post Acute Care Choice:  ([facility])          DME Arranged: N/A DME Agency: NA  Readmission Risk Interventions No flowsheet data found.

## 2021-07-13 NOTE — NC FL2 (Signed)
[place] LEVEL OF CARE SCREENING TOOL     IDENTIFICATION  Patient Name: [name] Birthdate: 1934/12/08 Sex: male Admission Date (Current Location): 07/09/2021  [facility] and [place] Number:  Herbalist and Address:  [facility],  [employer] [place], [place]      Provider Number: [id number]  Attending Physician Name and Address:  Geradine Girt, DO  Relative Name and Phone Number:  Nara Hasso (wife) Ph: [phone]    Current Level of Care: Hospital Recommended Level of Care: [place] Prior Approval Number:    Date Approved/Denied:   PASRR Number:    Discharge Plan: Other (Comment) ([facility])    Current Diagnoses: Patient Active Problem List   Diagnosis Date Noted   Acute metabolic encephalopathy 99991111   Mixed diabetic hyperlipidemia associated with type 2 diabetes mellitus ([place]) 07/10/2021   GERD without esophagitis 07/10/2021   Chronic kidney disease, stage 3a ([place]) 07/10/2021   Dementia without behavioral disturbance ([place]) 07/10/2021   Coronary artery disease involving native heart without angina pectoris 07/10/2021   Pneumonia of left lower lobe due to infectious organism 07/09/2021   Laceration of left index finger without foreign body without damage to nail 08/24/2017   Type 2 diabetes mellitus with stage 3a chronic kidney disease, without long-term current use of insulin ([place]) 11/04/2015   Left carotid bruit 11/04/2015   PVD with claudication - s/p PV surgery May 2016 03/23/2015   Intractable hiccups 02/16/2015   Dehydration 02/16/2015   Hyponatremia 02/16/2015   Perineal abscess 06/20/2013   Essential hypertension    Hx of CABG 2009    Hyperlipidemia    Chronic constipation 06/27/2012    Orientation RESPIRATION BLADDER Height & Weight     Self  Normal Incontinent Weight: 192 lb (87.1 kg) Height:  '6\' 2"'$  (188 cm)  BEHAVIORAL SYMPTOMS/MOOD NEUROLOGICAL BOWEL NUTRITION  STATUS      Continent Diet (Regular diet)  AMBULATORY STATUS COMMUNICATION OF NEEDS Skin   Extensive Assist Verbally Other (Comment) (Ecchymosis: bilateral arms, hands)                       Personal Care Assistance Level of Assistance  Bathing, Feeding, Dressing Bathing Assistance: Maximum assistance Feeding assistance: Independent Dressing Assistance: Maximum assistance     Functional Limitations Info  Sight, Hearing, Speech Sight Info: Impaired Hearing Info: Adequate Speech Info: Adequate    SPECIAL CARE FACTORS FREQUENCY  PT (By licensed PT)     PT Frequency: Min 2x's/week              Contractures Contractures Info: Not present    Additional Factors Info  Code Status, Allergies Code Status Info: DNR Allergies Info: Atorvastatin, Rosuvastatin, Simvastatin, Sulfonamide Derivatives           Current Medications (07/13/2021):  This is the current hospital active medication list Current Facility-Administered Medications  Medication Dose Route Frequency Provider Last Rate Last Admin   acetaminophen (TYLENOL) tablet 650 mg  650 mg Oral Q6H PRN Shalhoub, Sherryll Burger, MD       Or   acetaminophen (TYLENOL) suppository 650 mg  650 mg Rectal Q6H PRN Shalhoub, Sherryll Burger, MD       clopidogrel (PLAVIX) tablet 75 mg  75 mg Oral Q supper Vernelle Emerald, MD   75 mg at 07/12/21 1832   doxycycline (VIBRA-TABS) tablet 100 mg  100 mg Oral Q12H Eulogio Bear U, DO  100 mg at 07/13/21 1043   enoxaparin (LOVENOX) injection 40 mg  40 mg Subcutaneous Q24H Vernelle Emerald, MD   40 mg at 07/13/21 1043   insulin aspart (novoLOG) injection 0-15 Units  0-15 Units Subcutaneous TID AC & HS Shalhoub, Sherryll Burger, MD   3 Units at 07/12/21 2202   magnesium oxide (MAG-OX) tablet 400 mg  400 mg Oral Daily Eulogio Bear U, DO   400 mg at 07/13/21 1043   metoprolol succinate (TOPROL-XL) 24 hr tablet 25 mg  25 mg Oral Q supper Vernelle Emerald, MD   25 mg at 07/12/21 1832   ondansetron  (ZOFRAN) tablet 4 mg  4 mg Oral Q6H PRN Vernelle Emerald, MD       Or   ondansetron [facility]) injection 4 mg  4 mg Intravenous Q6H PRN Shalhoub, Sherryll Burger, MD   4 mg at 07/11/21 1908   pantoprazole (PROTONIX) EC tablet 40 mg  40 mg Oral QAC supper Vernelle Emerald, MD   40 mg at 07/12/21 1832   polyethylene glycol (MIRALAX / GLYCOLAX) packet 17 g  17 g Oral Daily PRN Shalhoub, Sherryll Burger, MD         Discharge Medications: Please see discharge summary for a list of discharge medications.  STOP taking these medications     ramipril 5 MG capsule Commonly known as: ALTACE      TAKE these medications     Aleve 220 MG tablet Generic drug: naproxen sodium Take 220-440 mg by mouth 2 (two) times daily as needed (for pain).    amoxicillin 500 MG capsule Commonly known as: AMOXIL Take 2,000 mg by mouth See admin instructions. Take 2,000 mg by mouth 30 minutes prior to dental appointments    clopidogrel 75 MG tablet Commonly known as: PLAVIX Take 75 mg by mouth daily with supper.    doxycycline 100 MG tablet Commonly known as: VIBRA-TABS Take 1 tablet (100 mg total) by mouth every 12 (twelve) hours.    magnesium oxide 400 (240 Mg) MG tablet Commonly known as: MAG-OX Take 1 tablet (400 mg total) by mouth daily. Start taking on: July 14, 2021    metFORMIN 1000 MG tablet Commonly known as: GLUCOPHAGE Take 1 tablet (1,000 mg total) by mouth daily with supper. What changed: how much to take    metoprolol succinate 25 MG 24 hr tablet Commonly known as: TOPROL-XL Take 25 mg by mouth daily with supper.    OneTouch Delica Lancets 99991111 Misc Pt to check BS daily  Dx: E11.49    OneTouch Verio test strip Generic drug: glucose blood Check sugars daily Dx: E11.49    pantoprazole 40 MG tablet Commonly known as: PROTONIX Take 40 mg by mouth daily before supper.    polyethylene glycol 17 g packet Commonly known as: MIRALAX / GLYCOLAX Take 17 g by mouth daily as needed for mild  constipation.    Wheelchair Misc Dx: Muscle Weakness    Relevant Imaging Results:  Relevant Lab Results:   Additional Information SSN: [id number]  Sherie Don, LCSW

## 2021-07-13 NOTE — NC FL2 (Signed)
[place] LEVEL OF CARE SCREENING TOOL     IDENTIFICATION  Patient Name: [name] Birthdate: 1935/04/14 Sex: male Admission Date (Current Location): 07/09/2021  [facility] and [place] Number:  Herbalist and Address:  [facility],  [place], [place]      Provider Number: [id number]  Attending Physician Name and Address:  Geradine Girt, DO  Relative Name and Phone Number:  Jake Irigoyen (wife) Ph: [phone]    Current Level of Care: Hospital Recommended Level of Care: [place] Prior Approval Number:    Date Approved/Denied:   PASRR Number:    Discharge Plan: Other (Comment) ([facility])    Current Diagnoses: Patient Active Problem List   Diagnosis Date Noted   Acute metabolic encephalopathy 99991111   Mixed diabetic hyperlipidemia associated with type 2 diabetes mellitus ([place]) 07/10/2021   GERD without esophagitis 07/10/2021   Chronic kidney disease, stage 3a ([place]) 07/10/2021   Dementia without behavioral disturbance ([place]) 07/10/2021   Coronary artery disease involving native heart without angina pectoris 07/10/2021   Pneumonia of left lower lobe due to infectious organism 07/09/2021   Laceration of left index finger without foreign body without damage to nail 08/24/2017   Type 2 diabetes mellitus with stage 3a chronic kidney disease, without long-term current use of insulin ([place]) 11/04/2015   Left carotid bruit 11/04/2015   PVD with claudication - s/p PV surgery May 2016 03/23/2015   Intractable hiccups 02/16/2015   Dehydration 02/16/2015   Hyponatremia 02/16/2015   Perineal abscess 06/20/2013   Essential hypertension    Hx of CABG 2009    Hyperlipidemia    Chronic constipation 06/27/2012    Orientation RESPIRATION BLADDER Height & Weight     Self  Normal Incontinent Weight: 192 lb (87.1 kg) Height:  '6\' 2"'$  (188 cm)  BEHAVIORAL SYMPTOMS/MOOD NEUROLOGICAL BOWEL NUTRITION  STATUS      Continent Diet (Carb Modified, Fluid restriction: 1800 mL)  AMBULATORY STATUS COMMUNICATION OF NEEDS Skin   Extensive Assist Verbally Other (Comment) (Ecchymosis: bilateral arms, hands)                       Personal Care Assistance Level of Assistance  Bathing, Feeding, Dressing Bathing Assistance: Maximum assistance Feeding assistance: Independent Dressing Assistance: Maximum assistance     Functional Limitations Info  Sight, Hearing, Speech Sight Info: Impaired Hearing Info: Adequate Speech Info: Adequate    SPECIAL CARE FACTORS FREQUENCY  PT (By licensed PT)     PT Frequency: Min 2x's/week              Contractures Contractures Info: Not present    Additional Factors Info  Code Status, Allergies Code Status Info: DNR Allergies Info: Atorvastatin, Rosuvastatin, Simvastatin, Sulfonamide Derivatives           Current Medications (07/13/2021):  This is the current hospital active medication list Current Facility-Administered Medications  Medication Dose Route Frequency Provider Last Rate Last Admin   acetaminophen (TYLENOL) tablet 650 mg  650 mg Oral Q6H PRN Shalhoub, Sherryll Burger, MD       Or   acetaminophen (TYLENOL) suppository 650 mg  650 mg Rectal Q6H PRN Shalhoub, Sherryll Burger, MD       clopidogrel (PLAVIX) tablet 75 mg  75 mg Oral Q supper Vernelle Emerald, MD   75 mg at 07/12/21 1832   doxycycline (VIBRA-TABS) tablet 100 mg  100 mg Oral Q12H Eulogio Bear  U, DO   100 mg at 07/13/21 1043   enoxaparin (LOVENOX) injection 40 mg  40 mg Subcutaneous Q24H Vernelle Emerald, MD   40 mg at 07/13/21 1043   insulin aspart (novoLOG) injection 0-15 Units  0-15 Units Subcutaneous TID AC & HS Shalhoub, Sherryll Burger, MD   3 Units at 07/12/21 2202   magnesium oxide (MAG-OX) tablet 400 mg  400 mg Oral Daily Eulogio Bear U, DO   400 mg at 07/13/21 1043   metoprolol succinate (TOPROL-XL) 24 hr tablet 25 mg  25 mg Oral Q supper Vernelle Emerald, MD   25 mg at  07/12/21 1832   ondansetron (ZOFRAN) tablet 4 mg  4 mg Oral Q6H PRN Vernelle Emerald, MD       Or   ondansetron [facility]) injection 4 mg  4 mg Intravenous Q6H PRN Shalhoub, Sherryll Burger, MD   4 mg at 07/11/21 1908   pantoprazole (PROTONIX) EC tablet 40 mg  40 mg Oral QAC supper Vernelle Emerald, MD   40 mg at 07/12/21 1832   polyethylene glycol (MIRALAX / GLYCOLAX) packet 17 g  17 g Oral Daily PRN Shalhoub, Sherryll Burger, MD         Discharge Medications: Please see discharge summary for a list of discharge medications.  Medication List       STOP taking these medications     ramipril 5 MG capsule Commonly known as: ALTACE           TAKE these medications     Aleve 220 MG tablet Generic drug: naproxen sodium Take 220-440 mg by mouth 2 (two) times daily as needed (for pain).    amoxicillin 500 MG capsule Commonly known as: AMOXIL Take 2,000 mg by mouth See admin instructions. Take 2,000 mg by mouth 30 minutes prior to dental appointments    clopidogrel 75 MG tablet Commonly known as: PLAVIX Take 75 mg by mouth daily with supper.    doxycycline 100 MG tablet Commonly known as: VIBRA-TABS Take 1 tablet (100 mg total) by mouth every 12 (twelve) hours.    magnesium oxide 400 (240 Mg) MG tablet Commonly known as: MAG-OX Take 1 tablet (400 mg total) by mouth daily. Start taking on: July 14, 2021    metFORMIN 1000 MG tablet Commonly known as: GLUCOPHAGE Take 1 tablet (1,000 mg total) by mouth daily with supper. What changed: how much to take    metoprolol succinate 25 MG 24 hr tablet Commonly known as: TOPROL-XL Take 25 mg by mouth daily with supper.    OneTouch Delica Lancets 99991111 Misc Pt to check BS daily  Dx: E11.49    OneTouch Verio test strip Generic drug: glucose blood Check sugars daily Dx: E11.49    pantoprazole 40 MG tablet Commonly known as: PROTONIX Take 40 mg by mouth daily before supper.    polyethylene glycol 17 g packet Commonly known as:  MIRALAX / GLYCOLAX Take 17 g by mouth daily as needed for mild constipation.    Wheelchair Misc Dx: Muscle Weakness      Relevant Imaging Results:  Relevant Lab Results:   Additional Information SSN: [id number]  Sherie Don, LCSW

## 2021-07-15 DIAGNOSIS — J189 Pneumonia, unspecified organism: Secondary | ICD-10-CM | POA: Diagnosis not present

## 2021-07-15 DIAGNOSIS — F0390 Unspecified dementia without behavioral disturbance: Secondary | ICD-10-CM | POA: Diagnosis not present

## 2021-07-15 DIAGNOSIS — E871 Hypo-osmolality and hyponatremia: Secondary | ICD-10-CM | POA: Diagnosis not present

## 2021-07-15 DIAGNOSIS — K5900 Constipation, unspecified: Secondary | ICD-10-CM | POA: Diagnosis not present

## 2021-07-15 DIAGNOSIS — I714 Abdominal aortic aneurysm, without rupture: Secondary | ICD-10-CM | POA: Diagnosis not present

## 2021-07-15 DIAGNOSIS — Z8679 Personal history of other diseases of the circulatory system: Secondary | ICD-10-CM | POA: Diagnosis not present

## 2021-07-15 DIAGNOSIS — I10 Essential (primary) hypertension: Secondary | ICD-10-CM | POA: Diagnosis not present

## 2021-07-15 DIAGNOSIS — E1122 Type 2 diabetes mellitus with diabetic chronic kidney disease: Secondary | ICD-10-CM | POA: Diagnosis not present

## 2021-07-15 DIAGNOSIS — R1310 Dysphagia, unspecified: Secondary | ICD-10-CM | POA: Diagnosis not present

## 2021-07-15 DIAGNOSIS — K219 Gastro-esophageal reflux disease without esophagitis: Secondary | ICD-10-CM | POA: Diagnosis not present

## 2021-07-15 DIAGNOSIS — I739 Peripheral vascular disease, unspecified: Secondary | ICD-10-CM | POA: Diagnosis not present

## 2021-07-15 DIAGNOSIS — N183 Chronic kidney disease, stage 3 unspecified: Secondary | ICD-10-CM | POA: Diagnosis not present

## 2021-07-15 LAB — CULTURE, BLOOD (ROUTINE X 2)
Culture: NO GROWTH
Culture: NO GROWTH

## 2021-07-16 DIAGNOSIS — E1122 Type 2 diabetes mellitus with diabetic chronic kidney disease: Secondary | ICD-10-CM | POA: Diagnosis not present

## 2021-07-16 DIAGNOSIS — J849 Interstitial pulmonary disease, unspecified: Secondary | ICD-10-CM | POA: Diagnosis not present

## 2021-07-16 DIAGNOSIS — G9341 Metabolic encephalopathy: Secondary | ICD-10-CM | POA: Diagnosis not present

## 2021-07-16 DIAGNOSIS — I2510 Atherosclerotic heart disease of native coronary artery without angina pectoris: Secondary | ICD-10-CM | POA: Diagnosis not present

## 2021-07-16 DIAGNOSIS — E871 Hypo-osmolality and hyponatremia: Secondary | ICD-10-CM | POA: Diagnosis not present

## 2021-07-16 DIAGNOSIS — Z9181 History of falling: Secondary | ICD-10-CM | POA: Diagnosis not present

## 2021-07-16 DIAGNOSIS — E1151 Type 2 diabetes mellitus with diabetic peripheral angiopathy without gangrene: Secondary | ICD-10-CM | POA: Diagnosis not present

## 2021-07-16 DIAGNOSIS — Z87891 Personal history of nicotine dependence: Secondary | ICD-10-CM | POA: Diagnosis not present

## 2021-07-16 DIAGNOSIS — K5909 Other constipation: Secondary | ICD-10-CM | POA: Diagnosis not present

## 2021-07-16 DIAGNOSIS — Z951 Presence of aortocoronary bypass graft: Secondary | ICD-10-CM | POA: Diagnosis not present

## 2021-07-16 DIAGNOSIS — I714 Abdominal aortic aneurysm, without rupture: Secondary | ICD-10-CM | POA: Diagnosis not present

## 2021-07-16 DIAGNOSIS — J181 Lobar pneumonia, unspecified organism: Secondary | ICD-10-CM | POA: Diagnosis not present

## 2021-07-16 DIAGNOSIS — I700 Atherosclerosis of aorta: Secondary | ICD-10-CM | POA: Diagnosis not present

## 2021-07-16 DIAGNOSIS — F0390 Unspecified dementia without behavioral disturbance: Secondary | ICD-10-CM | POA: Diagnosis not present

## 2021-07-16 DIAGNOSIS — J439 Emphysema, unspecified: Secondary | ICD-10-CM | POA: Diagnosis not present

## 2021-07-16 DIAGNOSIS — K4090 Unilateral inguinal hernia, without obstruction or gangrene, not specified as recurrent: Secondary | ICD-10-CM | POA: Diagnosis not present

## 2021-07-16 DIAGNOSIS — E1169 Type 2 diabetes mellitus with other specified complication: Secondary | ICD-10-CM | POA: Diagnosis not present

## 2021-07-16 DIAGNOSIS — Z96651 Presence of right artificial knee joint: Secondary | ICD-10-CM | POA: Diagnosis not present

## 2021-07-16 DIAGNOSIS — M4306 Spondylolysis, lumbar region: Secondary | ICD-10-CM | POA: Diagnosis not present

## 2021-07-16 DIAGNOSIS — E782 Mixed hyperlipidemia: Secondary | ICD-10-CM | POA: Diagnosis not present

## 2021-07-16 DIAGNOSIS — I129 Hypertensive chronic kidney disease with stage 1 through stage 4 chronic kidney disease, or unspecified chronic kidney disease: Secondary | ICD-10-CM | POA: Diagnosis not present

## 2021-07-16 DIAGNOSIS — N1832 Chronic kidney disease, stage 3b: Secondary | ICD-10-CM | POA: Diagnosis not present

## 2021-07-16 DIAGNOSIS — K219 Gastro-esophageal reflux disease without esophagitis: Secondary | ICD-10-CM | POA: Diagnosis not present

## 2021-07-16 DIAGNOSIS — R1310 Dysphagia, unspecified: Secondary | ICD-10-CM | POA: Diagnosis not present

## 2021-07-19 DIAGNOSIS — E1122 Type 2 diabetes mellitus with diabetic chronic kidney disease: Secondary | ICD-10-CM | POA: Diagnosis not present

## 2021-07-19 DIAGNOSIS — J181 Lobar pneumonia, unspecified organism: Secondary | ICD-10-CM | POA: Diagnosis not present

## 2021-07-19 DIAGNOSIS — I129 Hypertensive chronic kidney disease with stage 1 through stage 4 chronic kidney disease, or unspecified chronic kidney disease: Secondary | ICD-10-CM | POA: Diagnosis not present

## 2021-07-19 DIAGNOSIS — G9341 Metabolic encephalopathy: Secondary | ICD-10-CM | POA: Diagnosis not present

## 2021-07-19 DIAGNOSIS — F0390 Unspecified dementia without behavioral disturbance: Secondary | ICD-10-CM | POA: Diagnosis not present

## 2021-07-19 DIAGNOSIS — N1832 Chronic kidney disease, stage 3b: Secondary | ICD-10-CM | POA: Diagnosis not present

## 2021-07-20 DIAGNOSIS — E1122 Type 2 diabetes mellitus with diabetic chronic kidney disease: Secondary | ICD-10-CM | POA: Diagnosis not present

## 2021-07-20 DIAGNOSIS — N1832 Chronic kidney disease, stage 3b: Secondary | ICD-10-CM | POA: Diagnosis not present

## 2021-07-20 DIAGNOSIS — F0390 Unspecified dementia without behavioral disturbance: Secondary | ICD-10-CM | POA: Diagnosis not present

## 2021-07-20 DIAGNOSIS — J181 Lobar pneumonia, unspecified organism: Secondary | ICD-10-CM | POA: Diagnosis not present

## 2021-07-20 DIAGNOSIS — G9341 Metabolic encephalopathy: Secondary | ICD-10-CM | POA: Diagnosis not present

## 2021-07-20 DIAGNOSIS — I129 Hypertensive chronic kidney disease with stage 1 through stage 4 chronic kidney disease, or unspecified chronic kidney disease: Secondary | ICD-10-CM | POA: Diagnosis not present

## 2021-07-22 DIAGNOSIS — J181 Lobar pneumonia, unspecified organism: Secondary | ICD-10-CM | POA: Diagnosis not present

## 2021-07-22 DIAGNOSIS — E1122 Type 2 diabetes mellitus with diabetic chronic kidney disease: Secondary | ICD-10-CM | POA: Diagnosis not present

## 2021-07-22 DIAGNOSIS — N1832 Chronic kidney disease, stage 3b: Secondary | ICD-10-CM | POA: Diagnosis not present

## 2021-07-22 DIAGNOSIS — F0390 Unspecified dementia without behavioral disturbance: Secondary | ICD-10-CM | POA: Diagnosis not present

## 2021-07-22 DIAGNOSIS — I129 Hypertensive chronic kidney disease with stage 1 through stage 4 chronic kidney disease, or unspecified chronic kidney disease: Secondary | ICD-10-CM | POA: Diagnosis not present

## 2021-07-22 DIAGNOSIS — G9341 Metabolic encephalopathy: Secondary | ICD-10-CM | POA: Diagnosis not present

## 2021-07-27 DIAGNOSIS — G9341 Metabolic encephalopathy: Secondary | ICD-10-CM | POA: Diagnosis not present

## 2021-07-27 DIAGNOSIS — E1122 Type 2 diabetes mellitus with diabetic chronic kidney disease: Secondary | ICD-10-CM | POA: Diagnosis not present

## 2021-07-27 DIAGNOSIS — F0390 Unspecified dementia without behavioral disturbance: Secondary | ICD-10-CM | POA: Diagnosis not present

## 2021-07-27 DIAGNOSIS — N1832 Chronic kidney disease, stage 3b: Secondary | ICD-10-CM | POA: Diagnosis not present

## 2021-07-27 DIAGNOSIS — I129 Hypertensive chronic kidney disease with stage 1 through stage 4 chronic kidney disease, or unspecified chronic kidney disease: Secondary | ICD-10-CM | POA: Diagnosis not present

## 2021-07-27 DIAGNOSIS — J181 Lobar pneumonia, unspecified organism: Secondary | ICD-10-CM | POA: Diagnosis not present

## 2021-07-28 DIAGNOSIS — F0390 Unspecified dementia without behavioral disturbance: Secondary | ICD-10-CM | POA: Diagnosis not present

## 2021-07-28 DIAGNOSIS — I129 Hypertensive chronic kidney disease with stage 1 through stage 4 chronic kidney disease, or unspecified chronic kidney disease: Secondary | ICD-10-CM | POA: Diagnosis not present

## 2021-07-28 DIAGNOSIS — E1122 Type 2 diabetes mellitus with diabetic chronic kidney disease: Secondary | ICD-10-CM | POA: Diagnosis not present

## 2021-07-28 DIAGNOSIS — N1832 Chronic kidney disease, stage 3b: Secondary | ICD-10-CM | POA: Diagnosis not present

## 2021-07-28 DIAGNOSIS — J181 Lobar pneumonia, unspecified organism: Secondary | ICD-10-CM | POA: Diagnosis not present

## 2021-07-28 DIAGNOSIS — G9341 Metabolic encephalopathy: Secondary | ICD-10-CM | POA: Diagnosis not present

## 2021-07-29 DIAGNOSIS — I129 Hypertensive chronic kidney disease with stage 1 through stage 4 chronic kidney disease, or unspecified chronic kidney disease: Secondary | ICD-10-CM | POA: Diagnosis not present

## 2021-07-29 DIAGNOSIS — F0390 Unspecified dementia without behavioral disturbance: Secondary | ICD-10-CM | POA: Diagnosis not present

## 2021-07-29 DIAGNOSIS — E785 Hyperlipidemia, unspecified: Secondary | ICD-10-CM | POA: Diagnosis not present

## 2021-07-29 DIAGNOSIS — G9341 Metabolic encephalopathy: Secondary | ICD-10-CM | POA: Diagnosis not present

## 2021-07-29 DIAGNOSIS — N1832 Chronic kidney disease, stage 3b: Secondary | ICD-10-CM | POA: Diagnosis not present

## 2021-07-29 DIAGNOSIS — E559 Vitamin D deficiency, unspecified: Secondary | ICD-10-CM | POA: Diagnosis not present

## 2021-07-29 DIAGNOSIS — J181 Lobar pneumonia, unspecified organism: Secondary | ICD-10-CM | POA: Diagnosis not present

## 2021-07-29 DIAGNOSIS — D508 Other iron deficiency anemias: Secondary | ICD-10-CM | POA: Diagnosis not present

## 2021-07-29 DIAGNOSIS — E1122 Type 2 diabetes mellitus with diabetic chronic kidney disease: Secondary | ICD-10-CM | POA: Diagnosis not present

## 2021-07-29 DIAGNOSIS — D519 Vitamin B12 deficiency anemia, unspecified: Secondary | ICD-10-CM | POA: Diagnosis not present

## 2021-07-29 DIAGNOSIS — E08311 Diabetes mellitus due to underlying condition with unspecified diabetic retinopathy with macular edema: Secondary | ICD-10-CM | POA: Diagnosis not present

## 2021-07-30 DIAGNOSIS — I129 Hypertensive chronic kidney disease with stage 1 through stage 4 chronic kidney disease, or unspecified chronic kidney disease: Secondary | ICD-10-CM | POA: Diagnosis not present

## 2021-07-30 DIAGNOSIS — E1122 Type 2 diabetes mellitus with diabetic chronic kidney disease: Secondary | ICD-10-CM | POA: Diagnosis not present

## 2021-07-30 DIAGNOSIS — E785 Hyperlipidemia, unspecified: Secondary | ICD-10-CM | POA: Diagnosis not present

## 2021-07-30 DIAGNOSIS — R509 Fever, unspecified: Secondary | ICD-10-CM | POA: Diagnosis not present

## 2021-07-30 DIAGNOSIS — R079 Chest pain, unspecified: Secondary | ICD-10-CM | POA: Diagnosis not present

## 2021-07-30 DIAGNOSIS — N1832 Chronic kidney disease, stage 3b: Secondary | ICD-10-CM | POA: Diagnosis not present

## 2021-07-30 DIAGNOSIS — G9341 Metabolic encephalopathy: Secondary | ICD-10-CM | POA: Diagnosis not present

## 2021-07-30 DIAGNOSIS — J181 Lobar pneumonia, unspecified organism: Secondary | ICD-10-CM | POA: Diagnosis not present

## 2021-07-30 DIAGNOSIS — Z1331 Encounter for screening for depression: Secondary | ICD-10-CM | POA: Diagnosis not present

## 2021-07-30 DIAGNOSIS — L304 Erythema intertrigo: Secondary | ICD-10-CM | POA: Diagnosis not present

## 2021-07-30 DIAGNOSIS — F0390 Unspecified dementia without behavioral disturbance: Secondary | ICD-10-CM | POA: Diagnosis not present

## 2021-07-30 DIAGNOSIS — N1831 Chronic kidney disease, stage 3a: Secondary | ICD-10-CM | POA: Diagnosis not present

## 2021-08-01 DIAGNOSIS — G9341 Metabolic encephalopathy: Secondary | ICD-10-CM | POA: Diagnosis not present

## 2021-08-01 DIAGNOSIS — J181 Lobar pneumonia, unspecified organism: Secondary | ICD-10-CM | POA: Diagnosis not present

## 2021-08-01 DIAGNOSIS — I129 Hypertensive chronic kidney disease with stage 1 through stage 4 chronic kidney disease, or unspecified chronic kidney disease: Secondary | ICD-10-CM | POA: Diagnosis not present

## 2021-08-01 DIAGNOSIS — F0390 Unspecified dementia without behavioral disturbance: Secondary | ICD-10-CM | POA: Diagnosis not present

## 2021-08-01 DIAGNOSIS — N1832 Chronic kidney disease, stage 3b: Secondary | ICD-10-CM | POA: Diagnosis not present

## 2021-08-01 DIAGNOSIS — E1122 Type 2 diabetes mellitus with diabetic chronic kidney disease: Secondary | ICD-10-CM | POA: Diagnosis not present

## 2021-08-02 DIAGNOSIS — F0390 Unspecified dementia without behavioral disturbance: Secondary | ICD-10-CM | POA: Diagnosis not present

## 2021-08-02 DIAGNOSIS — I129 Hypertensive chronic kidney disease with stage 1 through stage 4 chronic kidney disease, or unspecified chronic kidney disease: Secondary | ICD-10-CM | POA: Diagnosis not present

## 2021-08-02 DIAGNOSIS — E1122 Type 2 diabetes mellitus with diabetic chronic kidney disease: Secondary | ICD-10-CM | POA: Diagnosis not present

## 2021-08-02 DIAGNOSIS — G9341 Metabolic encephalopathy: Secondary | ICD-10-CM | POA: Diagnosis not present

## 2021-08-02 DIAGNOSIS — J181 Lobar pneumonia, unspecified organism: Secondary | ICD-10-CM | POA: Diagnosis not present

## 2021-08-02 DIAGNOSIS — N1832 Chronic kidney disease, stage 3b: Secondary | ICD-10-CM | POA: Diagnosis not present

## 2021-08-03 DIAGNOSIS — I129 Hypertensive chronic kidney disease with stage 1 through stage 4 chronic kidney disease, or unspecified chronic kidney disease: Secondary | ICD-10-CM | POA: Diagnosis not present

## 2021-08-03 DIAGNOSIS — F0390 Unspecified dementia without behavioral disturbance: Secondary | ICD-10-CM | POA: Diagnosis not present

## 2021-08-03 DIAGNOSIS — N1832 Chronic kidney disease, stage 3b: Secondary | ICD-10-CM | POA: Diagnosis not present

## 2021-08-03 DIAGNOSIS — E1122 Type 2 diabetes mellitus with diabetic chronic kidney disease: Secondary | ICD-10-CM | POA: Diagnosis not present

## 2021-08-03 DIAGNOSIS — J181 Lobar pneumonia, unspecified organism: Secondary | ICD-10-CM | POA: Diagnosis not present

## 2021-08-03 DIAGNOSIS — G9341 Metabolic encephalopathy: Secondary | ICD-10-CM | POA: Diagnosis not present

## 2021-08-04 ENCOUNTER — Ambulatory Visit: Payer: Medicare Other | Admitting: Podiatry

## 2021-08-05 DIAGNOSIS — G9341 Metabolic encephalopathy: Secondary | ICD-10-CM | POA: Diagnosis not present

## 2021-08-05 DIAGNOSIS — J181 Lobar pneumonia, unspecified organism: Secondary | ICD-10-CM | POA: Diagnosis not present

## 2021-08-05 DIAGNOSIS — F0390 Unspecified dementia without behavioral disturbance: Secondary | ICD-10-CM | POA: Diagnosis not present

## 2021-08-05 DIAGNOSIS — I129 Hypertensive chronic kidney disease with stage 1 through stage 4 chronic kidney disease, or unspecified chronic kidney disease: Secondary | ICD-10-CM | POA: Diagnosis not present

## 2021-08-05 DIAGNOSIS — R509 Fever, unspecified: Secondary | ICD-10-CM | POA: Diagnosis not present

## 2021-08-05 DIAGNOSIS — R058 Other specified cough: Secondary | ICD-10-CM | POA: Diagnosis not present

## 2021-08-05 DIAGNOSIS — E559 Vitamin D deficiency, unspecified: Secondary | ICD-10-CM | POA: Diagnosis not present

## 2021-08-05 DIAGNOSIS — L8991 Pressure ulcer of unspecified site, stage 1: Secondary | ICD-10-CM | POA: Diagnosis not present

## 2021-08-05 DIAGNOSIS — N1832 Chronic kidney disease, stage 3b: Secondary | ICD-10-CM | POA: Diagnosis not present

## 2021-08-05 DIAGNOSIS — E1122 Type 2 diabetes mellitus with diabetic chronic kidney disease: Secondary | ICD-10-CM | POA: Diagnosis not present

## 2021-08-09 DIAGNOSIS — Z23 Encounter for immunization: Secondary | ICD-10-CM | POA: Diagnosis not present

## 2021-08-10 DIAGNOSIS — J181 Lobar pneumonia, unspecified organism: Secondary | ICD-10-CM | POA: Diagnosis not present

## 2021-08-10 DIAGNOSIS — F0390 Unspecified dementia without behavioral disturbance: Secondary | ICD-10-CM | POA: Diagnosis not present

## 2021-08-10 DIAGNOSIS — E1122 Type 2 diabetes mellitus with diabetic chronic kidney disease: Secondary | ICD-10-CM | POA: Diagnosis not present

## 2021-08-10 DIAGNOSIS — I129 Hypertensive chronic kidney disease with stage 1 through stage 4 chronic kidney disease, or unspecified chronic kidney disease: Secondary | ICD-10-CM | POA: Diagnosis not present

## 2021-08-10 DIAGNOSIS — G9341 Metabolic encephalopathy: Secondary | ICD-10-CM | POA: Diagnosis not present

## 2021-08-10 DIAGNOSIS — N1832 Chronic kidney disease, stage 3b: Secondary | ICD-10-CM | POA: Diagnosis not present

## 2021-08-12 ENCOUNTER — Ambulatory Visit: Payer: Medicare Other | Admitting: Podiatrist

## 2021-08-12 DIAGNOSIS — N1832 Chronic kidney disease, stage 3b: Secondary | ICD-10-CM | POA: Diagnosis not present

## 2021-08-12 DIAGNOSIS — G9341 Metabolic encephalopathy: Secondary | ICD-10-CM | POA: Diagnosis not present

## 2021-08-12 DIAGNOSIS — E1122 Type 2 diabetes mellitus with diabetic chronic kidney disease: Secondary | ICD-10-CM | POA: Diagnosis not present

## 2021-08-12 DIAGNOSIS — I129 Hypertensive chronic kidney disease with stage 1 through stage 4 chronic kidney disease, or unspecified chronic kidney disease: Secondary | ICD-10-CM | POA: Diagnosis not present

## 2021-08-12 DIAGNOSIS — J181 Lobar pneumonia, unspecified organism: Secondary | ICD-10-CM | POA: Diagnosis not present

## 2021-08-12 DIAGNOSIS — F0390 Unspecified dementia without behavioral disturbance: Secondary | ICD-10-CM | POA: Diagnosis not present

## 2021-08-15 DIAGNOSIS — I714 Abdominal aortic aneurysm, without rupture, unspecified: Secondary | ICD-10-CM | POA: Diagnosis not present

## 2021-08-15 DIAGNOSIS — Z951 Presence of aortocoronary bypass graft: Secondary | ICD-10-CM | POA: Diagnosis not present

## 2021-08-15 DIAGNOSIS — M4306 Spondylolysis, lumbar region: Secondary | ICD-10-CM | POA: Diagnosis not present

## 2021-08-15 DIAGNOSIS — E1151 Type 2 diabetes mellitus with diabetic peripheral angiopathy without gangrene: Secondary | ICD-10-CM | POA: Diagnosis not present

## 2021-08-15 DIAGNOSIS — I2510 Atherosclerotic heart disease of native coronary artery without angina pectoris: Secondary | ICD-10-CM | POA: Diagnosis not present

## 2021-08-15 DIAGNOSIS — I129 Hypertensive chronic kidney disease with stage 1 through stage 4 chronic kidney disease, or unspecified chronic kidney disease: Secondary | ICD-10-CM | POA: Diagnosis not present

## 2021-08-15 DIAGNOSIS — K5909 Other constipation: Secondary | ICD-10-CM | POA: Diagnosis not present

## 2021-08-15 DIAGNOSIS — J849 Interstitial pulmonary disease, unspecified: Secondary | ICD-10-CM | POA: Diagnosis not present

## 2021-08-15 DIAGNOSIS — F0390 Unspecified dementia without behavioral disturbance: Secondary | ICD-10-CM | POA: Diagnosis not present

## 2021-08-15 DIAGNOSIS — J181 Lobar pneumonia, unspecified organism: Secondary | ICD-10-CM | POA: Diagnosis not present

## 2021-08-15 DIAGNOSIS — J439 Emphysema, unspecified: Secondary | ICD-10-CM | POA: Diagnosis not present

## 2021-08-15 DIAGNOSIS — N1832 Chronic kidney disease, stage 3b: Secondary | ICD-10-CM | POA: Diagnosis not present

## 2021-08-15 DIAGNOSIS — Z96651 Presence of right artificial knee joint: Secondary | ICD-10-CM | POA: Diagnosis not present

## 2021-08-15 DIAGNOSIS — Z87891 Personal history of nicotine dependence: Secondary | ICD-10-CM | POA: Diagnosis not present

## 2021-08-15 DIAGNOSIS — K219 Gastro-esophageal reflux disease without esophagitis: Secondary | ICD-10-CM | POA: Diagnosis not present

## 2021-08-15 DIAGNOSIS — E1169 Type 2 diabetes mellitus with other specified complication: Secondary | ICD-10-CM | POA: Diagnosis not present

## 2021-08-15 DIAGNOSIS — R1310 Dysphagia, unspecified: Secondary | ICD-10-CM | POA: Diagnosis not present

## 2021-08-15 DIAGNOSIS — E871 Hypo-osmolality and hyponatremia: Secondary | ICD-10-CM | POA: Diagnosis not present

## 2021-08-15 DIAGNOSIS — E1122 Type 2 diabetes mellitus with diabetic chronic kidney disease: Secondary | ICD-10-CM | POA: Diagnosis not present

## 2021-08-15 DIAGNOSIS — E782 Mixed hyperlipidemia: Secondary | ICD-10-CM | POA: Diagnosis not present

## 2021-08-15 DIAGNOSIS — G9341 Metabolic encephalopathy: Secondary | ICD-10-CM | POA: Diagnosis not present

## 2021-08-15 DIAGNOSIS — I700 Atherosclerosis of aorta: Secondary | ICD-10-CM | POA: Diagnosis not present

## 2021-08-15 DIAGNOSIS — K4090 Unilateral inguinal hernia, without obstruction or gangrene, not specified as recurrent: Secondary | ICD-10-CM | POA: Diagnosis not present

## 2021-08-15 DIAGNOSIS — Z9181 History of falling: Secondary | ICD-10-CM | POA: Diagnosis not present

## 2021-08-16 DIAGNOSIS — F0390 Unspecified dementia without behavioral disturbance: Secondary | ICD-10-CM | POA: Diagnosis not present

## 2021-08-16 DIAGNOSIS — G9341 Metabolic encephalopathy: Secondary | ICD-10-CM | POA: Diagnosis not present

## 2021-08-16 DIAGNOSIS — I129 Hypertensive chronic kidney disease with stage 1 through stage 4 chronic kidney disease, or unspecified chronic kidney disease: Secondary | ICD-10-CM | POA: Diagnosis not present

## 2021-08-16 DIAGNOSIS — E1122 Type 2 diabetes mellitus with diabetic chronic kidney disease: Secondary | ICD-10-CM | POA: Diagnosis not present

## 2021-08-16 DIAGNOSIS — J181 Lobar pneumonia, unspecified organism: Secondary | ICD-10-CM | POA: Diagnosis not present

## 2021-08-16 DIAGNOSIS — N1832 Chronic kidney disease, stage 3b: Secondary | ICD-10-CM | POA: Diagnosis not present

## 2021-08-17 DIAGNOSIS — N1832 Chronic kidney disease, stage 3b: Secondary | ICD-10-CM | POA: Diagnosis not present

## 2021-08-17 DIAGNOSIS — E1122 Type 2 diabetes mellitus with diabetic chronic kidney disease: Secondary | ICD-10-CM | POA: Diagnosis not present

## 2021-08-17 DIAGNOSIS — J181 Lobar pneumonia, unspecified organism: Secondary | ICD-10-CM | POA: Diagnosis not present

## 2021-08-17 DIAGNOSIS — G9341 Metabolic encephalopathy: Secondary | ICD-10-CM | POA: Diagnosis not present

## 2021-08-17 DIAGNOSIS — F0390 Unspecified dementia without behavioral disturbance: Secondary | ICD-10-CM | POA: Diagnosis not present

## 2021-08-17 DIAGNOSIS — I129 Hypertensive chronic kidney disease with stage 1 through stage 4 chronic kidney disease, or unspecified chronic kidney disease: Secondary | ICD-10-CM | POA: Diagnosis not present

## 2021-08-19 DIAGNOSIS — N1832 Chronic kidney disease, stage 3b: Secondary | ICD-10-CM | POA: Diagnosis not present

## 2021-08-19 DIAGNOSIS — F0390 Unspecified dementia without behavioral disturbance: Secondary | ICD-10-CM | POA: Diagnosis not present

## 2021-08-19 DIAGNOSIS — J181 Lobar pneumonia, unspecified organism: Secondary | ICD-10-CM | POA: Diagnosis not present

## 2021-08-19 DIAGNOSIS — G9341 Metabolic encephalopathy: Secondary | ICD-10-CM | POA: Diagnosis not present

## 2021-08-19 DIAGNOSIS — E1122 Type 2 diabetes mellitus with diabetic chronic kidney disease: Secondary | ICD-10-CM | POA: Diagnosis not present

## 2021-08-19 DIAGNOSIS — I129 Hypertensive chronic kidney disease with stage 1 through stage 4 chronic kidney disease, or unspecified chronic kidney disease: Secondary | ICD-10-CM | POA: Diagnosis not present

## 2021-08-23 DIAGNOSIS — B351 Tinea unguium: Secondary | ICD-10-CM | POA: Diagnosis not present

## 2021-08-23 DIAGNOSIS — E1151 Type 2 diabetes mellitus with diabetic peripheral angiopathy without gangrene: Secondary | ICD-10-CM | POA: Diagnosis not present

## 2021-08-23 DIAGNOSIS — M2011 Hallux valgus (acquired), right foot: Secondary | ICD-10-CM | POA: Diagnosis not present

## 2021-08-23 DIAGNOSIS — M79675 Pain in left toe(s): Secondary | ICD-10-CM | POA: Diagnosis not present

## 2021-08-24 DIAGNOSIS — I129 Hypertensive chronic kidney disease with stage 1 through stage 4 chronic kidney disease, or unspecified chronic kidney disease: Secondary | ICD-10-CM | POA: Diagnosis not present

## 2021-08-24 DIAGNOSIS — E1122 Type 2 diabetes mellitus with diabetic chronic kidney disease: Secondary | ICD-10-CM | POA: Diagnosis not present

## 2021-08-24 DIAGNOSIS — N1832 Chronic kidney disease, stage 3b: Secondary | ICD-10-CM | POA: Diagnosis not present

## 2021-08-24 DIAGNOSIS — G9341 Metabolic encephalopathy: Secondary | ICD-10-CM | POA: Diagnosis not present

## 2021-08-24 DIAGNOSIS — F0390 Unspecified dementia without behavioral disturbance: Secondary | ICD-10-CM | POA: Diagnosis not present

## 2021-08-24 DIAGNOSIS — J181 Lobar pneumonia, unspecified organism: Secondary | ICD-10-CM | POA: Diagnosis not present

## 2021-08-26 DIAGNOSIS — F0390 Unspecified dementia without behavioral disturbance: Secondary | ICD-10-CM | POA: Diagnosis not present

## 2021-08-26 DIAGNOSIS — N1832 Chronic kidney disease, stage 3b: Secondary | ICD-10-CM | POA: Diagnosis not present

## 2021-08-26 DIAGNOSIS — G9341 Metabolic encephalopathy: Secondary | ICD-10-CM | POA: Diagnosis not present

## 2021-08-26 DIAGNOSIS — I129 Hypertensive chronic kidney disease with stage 1 through stage 4 chronic kidney disease, or unspecified chronic kidney disease: Secondary | ICD-10-CM | POA: Diagnosis not present

## 2021-08-26 DIAGNOSIS — E1122 Type 2 diabetes mellitus with diabetic chronic kidney disease: Secondary | ICD-10-CM | POA: Diagnosis not present

## 2021-08-26 DIAGNOSIS — J181 Lobar pneumonia, unspecified organism: Secondary | ICD-10-CM | POA: Diagnosis not present

## 2021-08-30 DIAGNOSIS — N1832 Chronic kidney disease, stage 3b: Secondary | ICD-10-CM | POA: Diagnosis not present

## 2021-08-30 DIAGNOSIS — I129 Hypertensive chronic kidney disease with stage 1 through stage 4 chronic kidney disease, or unspecified chronic kidney disease: Secondary | ICD-10-CM | POA: Diagnosis not present

## 2021-08-30 DIAGNOSIS — G9341 Metabolic encephalopathy: Secondary | ICD-10-CM | POA: Diagnosis not present

## 2021-08-30 DIAGNOSIS — E1122 Type 2 diabetes mellitus with diabetic chronic kidney disease: Secondary | ICD-10-CM | POA: Diagnosis not present

## 2021-08-30 DIAGNOSIS — F0390 Unspecified dementia without behavioral disturbance: Secondary | ICD-10-CM | POA: Diagnosis not present

## 2021-08-30 DIAGNOSIS — J181 Lobar pneumonia, unspecified organism: Secondary | ICD-10-CM | POA: Diagnosis not present

## 2021-09-03 DIAGNOSIS — G9341 Metabolic encephalopathy: Secondary | ICD-10-CM | POA: Diagnosis not present

## 2021-09-03 DIAGNOSIS — E1122 Type 2 diabetes mellitus with diabetic chronic kidney disease: Secondary | ICD-10-CM | POA: Diagnosis not present

## 2021-09-03 DIAGNOSIS — I129 Hypertensive chronic kidney disease with stage 1 through stage 4 chronic kidney disease, or unspecified chronic kidney disease: Secondary | ICD-10-CM | POA: Diagnosis not present

## 2021-09-03 DIAGNOSIS — J181 Lobar pneumonia, unspecified organism: Secondary | ICD-10-CM | POA: Diagnosis not present

## 2021-09-03 DIAGNOSIS — N1832 Chronic kidney disease, stage 3b: Secondary | ICD-10-CM | POA: Diagnosis not present

## 2021-09-03 DIAGNOSIS — F0390 Unspecified dementia without behavioral disturbance: Secondary | ICD-10-CM | POA: Diagnosis not present

## 2021-09-04 ENCOUNTER — Other Ambulatory Visit: Payer: Self-pay

## 2021-09-04 DIAGNOSIS — I714 Abdominal aortic aneurysm, without rupture, unspecified: Secondary | ICD-10-CM

## 2021-09-04 DIAGNOSIS — I70213 Atherosclerosis of native arteries of extremities with intermittent claudication, bilateral legs: Secondary | ICD-10-CM

## 2021-09-06 DIAGNOSIS — N1832 Chronic kidney disease, stage 3b: Secondary | ICD-10-CM | POA: Diagnosis not present

## 2021-09-06 DIAGNOSIS — I129 Hypertensive chronic kidney disease with stage 1 through stage 4 chronic kidney disease, or unspecified chronic kidney disease: Secondary | ICD-10-CM | POA: Diagnosis not present

## 2021-09-06 DIAGNOSIS — F0390 Unspecified dementia without behavioral disturbance: Secondary | ICD-10-CM | POA: Diagnosis not present

## 2021-09-06 DIAGNOSIS — G9341 Metabolic encephalopathy: Secondary | ICD-10-CM | POA: Diagnosis not present

## 2021-09-06 DIAGNOSIS — E1122 Type 2 diabetes mellitus with diabetic chronic kidney disease: Secondary | ICD-10-CM | POA: Diagnosis not present

## 2021-09-06 DIAGNOSIS — J181 Lobar pneumonia, unspecified organism: Secondary | ICD-10-CM | POA: Diagnosis not present

## 2021-09-09 DIAGNOSIS — N1832 Chronic kidney disease, stage 3b: Secondary | ICD-10-CM | POA: Diagnosis not present

## 2021-09-09 DIAGNOSIS — I129 Hypertensive chronic kidney disease with stage 1 through stage 4 chronic kidney disease, or unspecified chronic kidney disease: Secondary | ICD-10-CM | POA: Diagnosis not present

## 2021-09-09 DIAGNOSIS — F0390 Unspecified dementia without behavioral disturbance: Secondary | ICD-10-CM | POA: Diagnosis not present

## 2021-09-09 DIAGNOSIS — J181 Lobar pneumonia, unspecified organism: Secondary | ICD-10-CM | POA: Diagnosis not present

## 2021-09-09 DIAGNOSIS — G9341 Metabolic encephalopathy: Secondary | ICD-10-CM | POA: Diagnosis not present

## 2021-09-09 DIAGNOSIS — E1122 Type 2 diabetes mellitus with diabetic chronic kidney disease: Secondary | ICD-10-CM | POA: Diagnosis not present

## 2021-09-13 DIAGNOSIS — R296 Repeated falls: Secondary | ICD-10-CM | POA: Diagnosis not present

## 2021-09-13 DIAGNOSIS — M6281 Muscle weakness (generalized): Secondary | ICD-10-CM | POA: Diagnosis not present

## 2021-09-13 DIAGNOSIS — R2689 Other abnormalities of gait and mobility: Secondary | ICD-10-CM | POA: Diagnosis not present

## 2021-09-15 DIAGNOSIS — R2689 Other abnormalities of gait and mobility: Secondary | ICD-10-CM | POA: Diagnosis not present

## 2021-09-15 DIAGNOSIS — M6281 Muscle weakness (generalized): Secondary | ICD-10-CM | POA: Diagnosis not present

## 2021-09-15 DIAGNOSIS — R296 Repeated falls: Secondary | ICD-10-CM | POA: Diagnosis not present

## 2021-09-20 ENCOUNTER — Ambulatory Visit (INDEPENDENT_AMBULATORY_CARE_PROVIDER_SITE_OTHER): Payer: Medicare Other | Admitting: Physician Assistant

## 2021-09-20 ENCOUNTER — Other Ambulatory Visit: Payer: Self-pay

## 2021-09-20 ENCOUNTER — Ambulatory Visit (HOSPITAL_COMMUNITY)
Admission: RE | Admit: 2021-09-20 | Discharge: 2021-09-20 | Disposition: A | Discharge status: Home / Self Care | Payer: Medicare Other | Source: Ambulatory Visit | Attending: Vascular Surgery | Admitting: Vascular Surgery

## 2021-09-20 ENCOUNTER — Ambulatory Visit (INDEPENDENT_AMBULATORY_CARE_PROVIDER_SITE_OTHER)
Admission: RE | Admit: 2021-09-20 | Discharge: 2021-09-20 | Disposition: A | Discharge status: Home / Self Care | Payer: Medicare Other | Source: Ambulatory Visit | Attending: Vascular Surgery | Admitting: Vascular Surgery

## 2021-09-20 VITALS — BP 141/67 | HR 59 | Temp 97.6°F | Resp 20 | Ht 74.0 in | Wt 192.0 lb

## 2021-09-20 DIAGNOSIS — I70213 Atherosclerosis of native arteries of extremities with intermittent claudication, bilateral legs: Secondary | ICD-10-CM | POA: Insufficient documentation

## 2021-09-20 DIAGNOSIS — I714 Abdominal aortic aneurysm, without rupture, unspecified: Secondary | ICD-10-CM | POA: Insufficient documentation

## 2021-09-20 NOTE — Progress Notes (Signed)
Office Note     CC:  follow up Requesting Provider:  Ginger Organ., MD  HPI: [name] is a 86 y.o. (05/20/1935) male who presents for surveillance of PAD and AAA.  He is accompanied by his son who is the primary historian.  Patient is having trouble with memory.  He is also nonambulatory.  He is receiving care from a home health agency twice a day for several hours at a time to assist with activities of daily living.  He denies any rest pain in bilateral lower extremities.  He also denies any wounds of bilateral lower extremities.  Patient is also followed for surveillance of abdominal aortic aneurysm.  Aneurysm measured 3.3 cm over a year ago.  In September of this year he underwent unrelated CT scan which demonstrated a fusiform abdominal aortic aneurysm measuring 4.1 cm.  Today ultrasound demonstrates a 3.7 cm AAA.  He denies any new or changing abdominal or back pain.   Past Medical History:  Diagnosis Date   Arthritis    CAD (coronary artery disease)    Cataract    Diabetes mellitus    GERD (gastroesophageal reflux disease)    Helicobacter pylori gastritis 01/2006, 03/2011   EGD - Pylera Tx 2010   Hiccups    History of kidney stones    Hyperlipidemia    Hypertension    PAD (peripheral artery disease) [facility])     Past Surgical History:  Procedure Laterality Date   CATARACT EXTRACTION     right   COLONOSCOPY  2002, 2007, 06/09/2011   Dr. Evelina Bucy - [name], [place]: for FHx colon cancer, no adenomas; 2012: [facility] - normal   CORONARY ARTERY BYPASS GRAFT  05-07-2008   ENDARTERECTOMY FEMORAL Bilateral 03/23/2015   Procedure: ENDARTERECTOMY OF BILATERAL COMMON FEMORAL AND PROFUNDA ARTERIES;  Surgeon: Rosetta Posner, MD;  Location: [place];  Service: Vascular;  Laterality: Bilateral;   ESOPHAGOGASTRODUODENOSCOPY  02/20/06   Dr. Evelina Bucy, [name], [place]. pylori gastritis and GERD changes (pathology)   EYE SURGERY Left    cataract   HERNIA REPAIR  12/24/2010   INSERTION OF ILIAC  STENT Bilateral 03/23/2015   Procedure: LEFT EXTERNAL ILIAC AND BILATERAL COMMON ILIAC STENT;  Surgeon: Rosetta Posner, MD;  Location: [facility];  Service: Vascular;  Laterality: Bilateral;   JOINT REPLACEMENT     right knee replacement   PATCH ANGIOPLASTY Bilateral 03/23/2015   Procedure: PATCH ANGIOPLASTY OF BILATERAL COMMON FEMORAL ARTERIES;  Surgeon: Rosetta Posner, MD;  Location: [place];  Service: Vascular;  Laterality: Bilateral;   PERIPHERAL VASCULAR CATHETERIZATION N/A 03/11/2015   Procedure: Abdominal Aortogram w/Lower Extremity;  Surgeon: Rosetta Posner, MD;  Location: [place] [facility];  Service: Cardiovascular;  Laterality: N/A;   TOTAL KNEE ARTHROPLASTY  07-13-2012   right   UPPER GASTROINTESTINAL ENDOSCOPY  03/09/2009   Dr. Carlean Purl: H. pylori gastritis (Pylera Tx) and 2 cm hiatal hernia    Social History   Socioeconomic History   Marital status: Married    Spouse name: Not on file   Number of children: 2   Years of education: Not on file   Highest education level: Not on file  Occupational History   Occupation: Retired    Fish farm manager: RETIRED     Comment: sales  Tobacco Use   Smoking status: Former    Packs/day: 1.00    Years: 40.00    Pack years: 40.00    Types: Cigarettes   Smokeless tobacco: Never  Media planner  Vaping Use: Never used  Substance and Sexual Activity   Alcohol use: Yes    Alcohol/week: 0.0 standard drinks    Comment: 1 time per month   Drug use: No   Sexual activity: Not on file  Other Topics Concern   Not on file  Social History Narrative   Lives at home with wife    Right handed   Caffeine: 5 cups coffee per day    Social Determinants of Health   Financial Resource Strain: Not on file  Food Insecurity: Not on file  Transportation Needs: Not on file  Physical Activity: Not on file  Stress: Not on file  Social Connections: Not on file  Intimate Partner Violence: Not on file    Family History  Problem Relation Age of Onset   Heart disease  Father    Diabetes Father    Heart attack Father    Hypertension Father    Varicose Veins Father    Cancer Sister        colon   Diabetes Sister    Other Sister        varicose veins   Heart attack Sister    Heart disease Sister    Hyperlipidemia Sister    Hypertension Sister    Varicose Veins Sister    Cancer Brother        prostate   Diabetes Brother    Heart disease Brother    Hyperlipidemia Brother    Hypertension Brother    Other Brother        varicose veins   Heart attack Brother    Varicose Veins Brother    Cancer Sister        lukemia   Myasthenia gravis Sister    Colon cancer Sister    Leukemia Sister    Heart attack Brother    Crohn's disease Son     Current Outpatient Medications  Medication Sig Dispense Refill   ALEVE 220 MG tablet Take 220-440 mg by mouth 2 (two) times daily as needed (for pain).     glucose blood (ONETOUCH VERIO) test strip Check sugars daily Dx: E11.49     magnesium oxide (MAG-OX) 400 (240 Mg) MG tablet Take 1 tablet (400 mg total) by mouth daily.     metFORMIN (GLUCOPHAGE) 1000 MG tablet Take 1 tablet (1,000 mg total) by mouth daily with supper.     metoprolol succinate (TOPROL-XL) 25 MG 24 hr tablet Take 25 mg by mouth daily with supper.     Misc. Devices [facility]) MISC Dx: Muscle Weakness     nystatin (MYCOSTATIN/NYSTOP) powder SMARTSIG:1 Topical Daily PRN     OneTouch Delica Lancets 54S MISC Pt to check BS daily  Dx: E11.49     pantoprazole (PROTONIX) 40 MG tablet Take 40 mg by mouth daily before supper.     polyethylene glycol (MIRALAX / GLYCOLAX) 17 g packet Take 17 g by mouth daily as needed for mild constipation. 14 each 0   ramipril (ALTACE) 5 MG capsule Take 5 mg by mouth daily.     amoxicillin (AMOXIL) 500 MG capsule Take 2,000 mg by mouth See admin instructions. Take 2,000 mg by mouth 30 minutes prior to dental appointments (Patient not taking: Reported on 09/20/2021)  1   clopidogrel (PLAVIX) 75 MG tablet Take 75 mg by  mouth daily with supper. (Patient not taking: Reported on 09/20/2021)     doxycycline (VIBRA-TABS) 100 MG tablet Take 1 tablet (100 mg total) by mouth every 12 (  twelve) hours. (Patient not taking: Reported on 09/20/2021) 4 tablet 0   No current facility-administered medications for this visit.    Allergies  Allergen Reactions   Atorvastatin Other (See Comments)    Muscle pain   Rosuvastatin Other (See Comments)    Muscle pain   Simvastatin Other (See Comments)    Muscle pain   Sulfonamide Derivatives Hives     REVIEW OF SYSTEMS:   [X]  denotes positive finding, [ ]  denotes negative finding Cardiac  Comments:  Chest pain or chest pressure:    Shortness of breath upon exertion:    Short of breath when lying flat:    Irregular heart rhythm:        Vascular    Pain in calf, thigh, or hip brought on by ambulation:    Pain in feet at night that wakes you up from your sleep:     Blood clot in your veins:    Leg swelling:         Pulmonary    Oxygen at home:    Productive cough:     Wheezing:         Neurologic    Sudden weakness in arms or legs:     Sudden numbness in arms or legs:     Sudden onset of difficulty speaking or slurred speech:    Temporary loss of vision in one eye:     Problems with dizziness:         Gastrointestinal    Blood in stool:     Vomited blood:         Genitourinary    Burning when urinating:     Blood in urine:        Psychiatric    Major depression:         Hematologic    Bleeding problems:    Problems with blood clotting too easily:        Skin    Rashes or ulcers:        Constitutional    Fever or chills:      PHYSICAL EXAMINATION:  Vitals:   09/20/21 1014  BP: (!) 141/67  Pulse: (!) 59  Resp: 20  Temp: 97.6 F (36.4 C)  TempSrc: Temporal  SpO2: 98%  Weight: 192 lb (87.1 kg)  Height: 6\' 2"  (1.88 m)    General:  WDWN in NAD; vital signs documented above Gait: Not observed HENT: WNL, normocephalic Pulmonary:  normal non-labored breathing Cardiac: regular HR Abdomen: soft, NT, no masses Skin: without rashes Vascular Exam/Pulses:  Right Left  Radial 2+ (normal) 2+ (normal)  DP absent absent  PT absent absent   Extremities: without ischemic changes, without Gangrene , without cellulitis; without open wounds;  Musculoskeletal: no muscle wasting or atrophy  Neurologic: A&O X 3;  No focal weakness or paresthesias are detected Psychiatric:  The pt has Normal affect.   Non-Invasive Vascular Imaging:   3.7 cm AAA  ABIs unreliable; tibial vessels show monophasic flow    ASSESSMENT/PLAN:: 86 y.o. male here for surveillance of PAD and AAA  -Patient is accompanied by his son today who states he is nonambulatory and only minimally weightbearing for transferring.  Patient is without any rest pain or nonhealing wounds of bilateral lower extremities.  ABI findings are unreliable due to calcified vessels however as long as patient does not have any rest pain or tissue loss, we will proceed conservatively  -Patient is also followed for AAA surveillance.  In September of  this year a CT scan demonstrated a 4.1 cm fusiform AAA.  On ultrasound today maximum diameter is 3.7 cm.  We will repeat ultrasound in 1 year.  No indication for repair at this time   Dagoberto Ligas, PA-C [facility] [phone]  Clinic MD:   Virl Cagey

## 2021-09-22 DIAGNOSIS — R278 Other lack of coordination: Secondary | ICD-10-CM | POA: Diagnosis not present

## 2021-09-22 DIAGNOSIS — R2689 Other abnormalities of gait and mobility: Secondary | ICD-10-CM | POA: Diagnosis not present

## 2021-09-22 DIAGNOSIS — R296 Repeated falls: Secondary | ICD-10-CM | POA: Diagnosis not present

## 2021-09-22 DIAGNOSIS — M6281 Muscle weakness (generalized): Secondary | ICD-10-CM | POA: Diagnosis not present

## 2021-09-27 DIAGNOSIS — R296 Repeated falls: Secondary | ICD-10-CM | POA: Diagnosis not present

## 2021-09-27 DIAGNOSIS — M6281 Muscle weakness (generalized): Secondary | ICD-10-CM | POA: Diagnosis not present

## 2021-09-27 DIAGNOSIS — R2689 Other abnormalities of gait and mobility: Secondary | ICD-10-CM | POA: Diagnosis not present

## 2021-09-28 DIAGNOSIS — M6281 Muscle weakness (generalized): Secondary | ICD-10-CM | POA: Diagnosis not present

## 2021-09-28 DIAGNOSIS — R278 Other lack of coordination: Secondary | ICD-10-CM | POA: Diagnosis not present

## 2021-09-29 DIAGNOSIS — M6281 Muscle weakness (generalized): Secondary | ICD-10-CM | POA: Diagnosis not present

## 2021-09-29 DIAGNOSIS — R2689 Other abnormalities of gait and mobility: Secondary | ICD-10-CM | POA: Diagnosis not present

## 2021-09-29 DIAGNOSIS — R296 Repeated falls: Secondary | ICD-10-CM | POA: Diagnosis not present

## 2021-09-30 DIAGNOSIS — M6281 Muscle weakness (generalized): Secondary | ICD-10-CM | POA: Diagnosis not present

## 2021-09-30 DIAGNOSIS — R278 Other lack of coordination: Secondary | ICD-10-CM | POA: Diagnosis not present

## 2021-10-04 DIAGNOSIS — R296 Repeated falls: Secondary | ICD-10-CM | POA: Diagnosis not present

## 2021-10-04 DIAGNOSIS — M6281 Muscle weakness (generalized): Secondary | ICD-10-CM | POA: Diagnosis not present

## 2021-10-04 DIAGNOSIS — R2689 Other abnormalities of gait and mobility: Secondary | ICD-10-CM | POA: Diagnosis not present

## 2021-10-05 DIAGNOSIS — M6281 Muscle weakness (generalized): Secondary | ICD-10-CM | POA: Diagnosis not present

## 2021-10-05 DIAGNOSIS — R278 Other lack of coordination: Secondary | ICD-10-CM | POA: Diagnosis not present

## 2021-10-06 DIAGNOSIS — R296 Repeated falls: Secondary | ICD-10-CM | POA: Diagnosis not present

## 2021-10-06 DIAGNOSIS — R2689 Other abnormalities of gait and mobility: Secondary | ICD-10-CM | POA: Diagnosis not present

## 2021-10-06 DIAGNOSIS — M6281 Muscle weakness (generalized): Secondary | ICD-10-CM | POA: Diagnosis not present

## 2021-10-07 DIAGNOSIS — M6281 Muscle weakness (generalized): Secondary | ICD-10-CM | POA: Diagnosis not present

## 2021-10-07 DIAGNOSIS — R278 Other lack of coordination: Secondary | ICD-10-CM | POA: Diagnosis not present

## 2021-10-11 DIAGNOSIS — M6281 Muscle weakness (generalized): Secondary | ICD-10-CM | POA: Diagnosis not present

## 2021-10-11 DIAGNOSIS — R2689 Other abnormalities of gait and mobility: Secondary | ICD-10-CM | POA: Diagnosis not present

## 2021-10-11 DIAGNOSIS — R296 Repeated falls: Secondary | ICD-10-CM | POA: Diagnosis not present

## 2021-10-12 DIAGNOSIS — M6281 Muscle weakness (generalized): Secondary | ICD-10-CM | POA: Diagnosis not present

## 2021-10-12 DIAGNOSIS — R278 Other lack of coordination: Secondary | ICD-10-CM | POA: Diagnosis not present

## 2021-10-13 DIAGNOSIS — M6281 Muscle weakness (generalized): Secondary | ICD-10-CM | POA: Diagnosis not present

## 2021-10-13 DIAGNOSIS — R296 Repeated falls: Secondary | ICD-10-CM | POA: Diagnosis not present

## 2021-10-13 DIAGNOSIS — R2689 Other abnormalities of gait and mobility: Secondary | ICD-10-CM | POA: Diagnosis not present

## 2021-10-14 DIAGNOSIS — R278 Other lack of coordination: Secondary | ICD-10-CM | POA: Diagnosis not present

## 2021-10-14 DIAGNOSIS — M6281 Muscle weakness (generalized): Secondary | ICD-10-CM | POA: Diagnosis not present

## 2021-10-18 DIAGNOSIS — R296 Repeated falls: Secondary | ICD-10-CM | POA: Diagnosis not present

## 2021-10-18 DIAGNOSIS — R2689 Other abnormalities of gait and mobility: Secondary | ICD-10-CM | POA: Diagnosis not present

## 2021-10-18 DIAGNOSIS — M6281 Muscle weakness (generalized): Secondary | ICD-10-CM | POA: Diagnosis not present

## 2021-10-19 DIAGNOSIS — R2689 Other abnormalities of gait and mobility: Secondary | ICD-10-CM | POA: Diagnosis not present

## 2021-10-19 DIAGNOSIS — M6281 Muscle weakness (generalized): Secondary | ICD-10-CM | POA: Diagnosis not present

## 2021-10-19 DIAGNOSIS — R296 Repeated falls: Secondary | ICD-10-CM | POA: Diagnosis not present

## 2021-10-20 DIAGNOSIS — R2689 Other abnormalities of gait and mobility: Secondary | ICD-10-CM | POA: Diagnosis not present

## 2021-10-20 DIAGNOSIS — M6281 Muscle weakness (generalized): Secondary | ICD-10-CM | POA: Diagnosis not present

## 2021-10-20 DIAGNOSIS — R296 Repeated falls: Secondary | ICD-10-CM | POA: Diagnosis not present

## 2021-10-21 DIAGNOSIS — R278 Other lack of coordination: Secondary | ICD-10-CM | POA: Diagnosis not present

## 2021-10-21 DIAGNOSIS — M6281 Muscle weakness (generalized): Secondary | ICD-10-CM | POA: Diagnosis not present

## 2021-10-26 DIAGNOSIS — R278 Other lack of coordination: Secondary | ICD-10-CM | POA: Diagnosis not present

## 2021-10-26 DIAGNOSIS — M6281 Muscle weakness (generalized): Secondary | ICD-10-CM | POA: Diagnosis not present

## 2021-10-27 DIAGNOSIS — R2689 Other abnormalities of gait and mobility: Secondary | ICD-10-CM | POA: Diagnosis not present

## 2021-10-27 DIAGNOSIS — R296 Repeated falls: Secondary | ICD-10-CM | POA: Diagnosis not present

## 2021-10-27 DIAGNOSIS — M6281 Muscle weakness (generalized): Secondary | ICD-10-CM | POA: Diagnosis not present

## 2021-10-28 DIAGNOSIS — M6281 Muscle weakness (generalized): Secondary | ICD-10-CM | POA: Diagnosis not present

## 2021-10-28 DIAGNOSIS — R278 Other lack of coordination: Secondary | ICD-10-CM | POA: Diagnosis not present

## 2021-11-01 DIAGNOSIS — R296 Repeated falls: Secondary | ICD-10-CM | POA: Diagnosis not present

## 2021-11-01 DIAGNOSIS — R2689 Other abnormalities of gait and mobility: Secondary | ICD-10-CM | POA: Diagnosis not present

## 2021-11-01 DIAGNOSIS — M6281 Muscle weakness (generalized): Secondary | ICD-10-CM | POA: Diagnosis not present

## 2021-11-03 DIAGNOSIS — M6281 Muscle weakness (generalized): Secondary | ICD-10-CM | POA: Diagnosis not present

## 2021-11-03 DIAGNOSIS — R2689 Other abnormalities of gait and mobility: Secondary | ICD-10-CM | POA: Diagnosis not present

## 2021-11-03 DIAGNOSIS — R296 Repeated falls: Secondary | ICD-10-CM | POA: Diagnosis not present

## 2021-11-04 DIAGNOSIS — M6281 Muscle weakness (generalized): Secondary | ICD-10-CM | POA: Diagnosis not present

## 2021-11-04 DIAGNOSIS — R278 Other lack of coordination: Secondary | ICD-10-CM | POA: Diagnosis not present

## 2021-11-08 DIAGNOSIS — M6281 Muscle weakness (generalized): Secondary | ICD-10-CM | POA: Diagnosis not present

## 2021-11-08 DIAGNOSIS — R296 Repeated falls: Secondary | ICD-10-CM | POA: Diagnosis not present

## 2021-11-08 DIAGNOSIS — R2689 Other abnormalities of gait and mobility: Secondary | ICD-10-CM | POA: Diagnosis not present

## 2021-11-09 DIAGNOSIS — M6281 Muscle weakness (generalized): Secondary | ICD-10-CM | POA: Diagnosis not present

## 2021-11-09 DIAGNOSIS — R278 Other lack of coordination: Secondary | ICD-10-CM | POA: Diagnosis not present

## 2021-11-10 DIAGNOSIS — M6281 Muscle weakness (generalized): Secondary | ICD-10-CM | POA: Diagnosis not present

## 2021-11-10 DIAGNOSIS — R296 Repeated falls: Secondary | ICD-10-CM | POA: Diagnosis not present

## 2021-11-10 DIAGNOSIS — R2689 Other abnormalities of gait and mobility: Secondary | ICD-10-CM | POA: Diagnosis not present

## 2021-11-15 DIAGNOSIS — R296 Repeated falls: Secondary | ICD-10-CM | POA: Diagnosis not present

## 2021-11-15 DIAGNOSIS — M6281 Muscle weakness (generalized): Secondary | ICD-10-CM | POA: Diagnosis not present

## 2021-11-15 DIAGNOSIS — R2689 Other abnormalities of gait and mobility: Secondary | ICD-10-CM | POA: Diagnosis not present

## 2021-11-16 DIAGNOSIS — R278 Other lack of coordination: Secondary | ICD-10-CM | POA: Diagnosis not present

## 2021-11-16 DIAGNOSIS — M6281 Muscle weakness (generalized): Secondary | ICD-10-CM | POA: Diagnosis not present

## 2021-11-17 DIAGNOSIS — M6281 Muscle weakness (generalized): Secondary | ICD-10-CM | POA: Diagnosis not present

## 2021-11-17 DIAGNOSIS — R2689 Other abnormalities of gait and mobility: Secondary | ICD-10-CM | POA: Diagnosis not present

## 2021-11-17 DIAGNOSIS — R296 Repeated falls: Secondary | ICD-10-CM | POA: Diagnosis not present

## 2021-11-18 DIAGNOSIS — R278 Other lack of coordination: Secondary | ICD-10-CM | POA: Diagnosis not present

## 2021-11-18 DIAGNOSIS — M6281 Muscle weakness (generalized): Secondary | ICD-10-CM | POA: Diagnosis not present

## 2021-11-22 DIAGNOSIS — M6281 Muscle weakness (generalized): Secondary | ICD-10-CM | POA: Diagnosis not present

## 2021-11-22 DIAGNOSIS — R2689 Other abnormalities of gait and mobility: Secondary | ICD-10-CM | POA: Diagnosis not present

## 2021-11-22 DIAGNOSIS — R296 Repeated falls: Secondary | ICD-10-CM | POA: Diagnosis not present

## 2021-11-23 DIAGNOSIS — M6281 Muscle weakness (generalized): Secondary | ICD-10-CM | POA: Diagnosis not present

## 2021-11-23 DIAGNOSIS — R278 Other lack of coordination: Secondary | ICD-10-CM | POA: Diagnosis not present

## 2021-11-24 DIAGNOSIS — R296 Repeated falls: Secondary | ICD-10-CM | POA: Diagnosis not present

## 2021-11-24 DIAGNOSIS — M6281 Muscle weakness (generalized): Secondary | ICD-10-CM | POA: Diagnosis not present

## 2021-11-24 DIAGNOSIS — R2689 Other abnormalities of gait and mobility: Secondary | ICD-10-CM | POA: Diagnosis not present

## 2021-11-25 DIAGNOSIS — R278 Other lack of coordination: Secondary | ICD-10-CM | POA: Diagnosis not present

## 2021-11-25 DIAGNOSIS — M6281 Muscle weakness (generalized): Secondary | ICD-10-CM | POA: Diagnosis not present

## 2021-11-29 DIAGNOSIS — M6281 Muscle weakness (generalized): Secondary | ICD-10-CM | POA: Diagnosis not present

## 2021-11-29 DIAGNOSIS — R296 Repeated falls: Secondary | ICD-10-CM | POA: Diagnosis not present

## 2021-11-29 DIAGNOSIS — R2689 Other abnormalities of gait and mobility: Secondary | ICD-10-CM | POA: Diagnosis not present

## 2021-11-30 DIAGNOSIS — R278 Other lack of coordination: Secondary | ICD-10-CM | POA: Diagnosis not present

## 2021-11-30 DIAGNOSIS — M6281 Muscle weakness (generalized): Secondary | ICD-10-CM | POA: Diagnosis not present

## 2021-12-01 DIAGNOSIS — R296 Repeated falls: Secondary | ICD-10-CM | POA: Diagnosis not present

## 2021-12-01 DIAGNOSIS — R2689 Other abnormalities of gait and mobility: Secondary | ICD-10-CM | POA: Diagnosis not present

## 2021-12-01 DIAGNOSIS — M6281 Muscle weakness (generalized): Secondary | ICD-10-CM | POA: Diagnosis not present

## 2021-12-02 DIAGNOSIS — M6281 Muscle weakness (generalized): Secondary | ICD-10-CM | POA: Diagnosis not present

## 2021-12-02 DIAGNOSIS — R278 Other lack of coordination: Secondary | ICD-10-CM | POA: Diagnosis not present

## 2021-12-05 ENCOUNTER — Emergency Department (HOSPITAL_BASED_OUTPATIENT_CLINIC_OR_DEPARTMENT_OTHER)
Admission: EM | Admit: 2021-12-05 | Discharge: 2021-12-05 | Disposition: A | Discharge status: Home / Self Care | Payer: Medicare Other | Attending: Emergency Medicine | Admitting: Emergency Medicine

## 2021-12-05 ENCOUNTER — Emergency Department (HOSPITAL_BASED_OUTPATIENT_CLINIC_OR_DEPARTMENT_OTHER): Payer: Medicare Other | Admitting: Radiology

## 2021-12-05 ENCOUNTER — Encounter (HOSPITAL_BASED_OUTPATIENT_CLINIC_OR_DEPARTMENT_OTHER): Payer: Self-pay

## 2021-12-05 DIAGNOSIS — Z7984 Long term (current) use of oral hypoglycemic drugs: Secondary | ICD-10-CM | POA: Insufficient documentation

## 2021-12-05 DIAGNOSIS — R5383 Other fatigue: Secondary | ICD-10-CM | POA: Diagnosis not present

## 2021-12-05 DIAGNOSIS — R059 Cough, unspecified: Secondary | ICD-10-CM | POA: Diagnosis not present

## 2021-12-05 DIAGNOSIS — I129 Hypertensive chronic kidney disease with stage 1 through stage 4 chronic kidney disease, or unspecified chronic kidney disease: Secondary | ICD-10-CM | POA: Diagnosis not present

## 2021-12-05 DIAGNOSIS — F0390 Unspecified dementia without behavioral disturbance: Secondary | ICD-10-CM | POA: Insufficient documentation

## 2021-12-05 DIAGNOSIS — R0981 Nasal congestion: Secondary | ICD-10-CM | POA: Insufficient documentation

## 2021-12-05 DIAGNOSIS — Z955 Presence of coronary angioplasty implant and graft: Secondary | ICD-10-CM | POA: Insufficient documentation

## 2021-12-05 DIAGNOSIS — Z79899 Other long term (current) drug therapy: Secondary | ICD-10-CM | POA: Diagnosis not present

## 2021-12-05 DIAGNOSIS — R410 Disorientation, unspecified: Secondary | ICD-10-CM | POA: Diagnosis not present

## 2021-12-05 DIAGNOSIS — R531 Weakness: Secondary | ICD-10-CM | POA: Insufficient documentation

## 2021-12-05 DIAGNOSIS — Z20822 Contact with and (suspected) exposure to covid-19: Secondary | ICD-10-CM | POA: Insufficient documentation

## 2021-12-05 DIAGNOSIS — E119 Type 2 diabetes mellitus without complications: Secondary | ICD-10-CM | POA: Insufficient documentation

## 2021-12-05 DIAGNOSIS — J3489 Other specified disorders of nose and nasal sinuses: Secondary | ICD-10-CM | POA: Diagnosis not present

## 2021-12-05 DIAGNOSIS — N183 Chronic kidney disease, stage 3 unspecified: Secondary | ICD-10-CM | POA: Diagnosis not present

## 2021-12-05 DIAGNOSIS — I2510 Atherosclerotic heart disease of native coronary artery without angina pectoris: Secondary | ICD-10-CM | POA: Diagnosis not present

## 2021-12-05 DIAGNOSIS — R110 Nausea: Secondary | ICD-10-CM | POA: Diagnosis not present

## 2021-12-05 DIAGNOSIS — R61 Generalized hyperhidrosis: Secondary | ICD-10-CM | POA: Diagnosis not present

## 2021-12-05 LAB — CBC WITH DIFFERENTIAL/PLATELET
Abs Immature Granulocytes: 0.05 10*3/uL (ref 0.00–0.07)
Basophils Absolute: 0.1 10*3/uL (ref 0.0–0.1)
Basophils Relative: 0 %
Eosinophils Absolute: 0.1 10*3/uL (ref 0.0–0.5)
Eosinophils Relative: 1 %
HCT: 43 % (ref 39.0–52.0)
Hemoglobin: 14.5 g/dL (ref 13.0–17.0)
Immature Granulocytes: 0 %
Lymphocytes Relative: 13 %
Lymphs Abs: 1.9 10*3/uL (ref 0.7–4.0)
MCH: 28.5 pg (ref 26.0–34.0)
MCHC: 33.7 g/dL (ref 30.0–36.0)
MCV: 84.6 fL (ref 80.0–100.0)
Monocytes Absolute: 0.9 10*3/uL (ref 0.1–1.0)
Monocytes Relative: 6 %
Neutro Abs: 11.3 10*3/uL — ABNORMAL HIGH (ref 1.7–7.7)
Neutrophils Relative %: 80 %
Platelets: 121 10*3/uL — ABNORMAL LOW (ref 150–400)
RBC: 5.08 MIL/uL (ref 4.22–5.81)
RDW: 13.4 % (ref 11.5–15.5)
WBC: 14.2 10*3/uL — ABNORMAL HIGH (ref 4.0–10.5)
nRBC: 0 % (ref 0.0–0.2)

## 2021-12-05 LAB — URINALYSIS, ROUTINE W REFLEX MICROSCOPIC
Bilirubin Urine: NEGATIVE
Glucose, UA: NEGATIVE mg/dL
Hgb urine dipstick: NEGATIVE
Ketones, ur: NEGATIVE mg/dL
Leukocytes,Ua: NEGATIVE
Nitrite: NEGATIVE
Protein, ur: NEGATIVE mg/dL
Specific Gravity, Urine: 1.018 (ref 1.005–1.030)
pH: 7 (ref 5.0–8.0)

## 2021-12-05 LAB — COMPREHENSIVE METABOLIC PANEL
ALT: 11 U/L (ref 0–44)
AST: 18 U/L (ref 15–41)
Albumin: 4.2 g/dL (ref 3.5–5.0)
Alkaline Phosphatase: 74 U/L (ref 38–126)
Anion gap: 12 (ref 5–15)
BUN: 28 mg/dL — ABNORMAL HIGH (ref 8–23)
CO2: 22 mmol/L (ref 22–32)
Calcium: 9.7 mg/dL (ref 8.9–10.3)
Chloride: 95 mmol/L — ABNORMAL LOW (ref 98–111)
Creatinine, Ser: 1.24 mg/dL (ref 0.61–1.24)
GFR, Estimated: 57 mL/min — ABNORMAL LOW (ref >60)
Glucose, Bld: 145 mg/dL — ABNORMAL HIGH (ref 70–99)
Potassium: 4.8 mmol/L (ref 3.5–5.1)
Sodium: 129 mmol/L — ABNORMAL LOW (ref 135–145)
Total Bilirubin: 0.8 mg/dL (ref 0.3–1.2)
Total Protein: 7.6 g/dL (ref 6.5–8.1)

## 2021-12-05 LAB — RESP PANEL BY RT-PCR (FLU A&B, COVID) ARPGX2
Influenza A by PCR: NEGATIVE
Influenza B by PCR: NEGATIVE
SARS Coronavirus 2 by RT PCR: NEGATIVE

## 2021-12-05 LAB — TROPONIN I (HIGH SENSITIVITY): Troponin I (High Sensitivity): 5 ng/L (ref <18)

## 2021-12-05 LAB — CBG MONITORING, ED: Glucose-Capillary: 140 mg/dL — ABNORMAL HIGH (ref 70–99)

## 2021-12-05 MED ORDER — AMOXICILLIN-POT CLAVULANATE 875-125 MG PO TABS: 1.0000 | ORAL_TABLET | Freq: Two times a day (BID) | ORAL | 0 refills | Status: AC

## 2021-12-05 MED ORDER — SODIUM CHLORIDE 0.9 % IV BOLUS
500.0000 mL | Freq: Once | INTRAVENOUS | Status: AC
  Administered 2021-12-05: 500 mL via INTRAVENOUS

## 2021-12-05 MED ORDER — FLUCONAZOLE 200 MG PO TABS: 200.0000 mg | ORAL_TABLET | Freq: Once | ORAL | 0 refills | Status: AC

## 2021-12-05 NOTE — Discharge Instructions (Addendum)
Please call tomorrow morning schedule follow-up appointment with [name]'s primary care doctor's office.  If [name] begins having fevers (temp over 100.58F) at home, new productive cough, complaining of worsening headache or fatigue, please start giving him the Augmentin antibiotic that was prescribed.  This can cause a little stomach upset and diarrhea.  If he starts to show signs of a yeast infection while taking the antibiotic, he can give him the fluconazole tablet that was also prescribed.

## 2021-12-05 NOTE — ED Notes (Signed)
Pt drinking water ,trying to get urine sample.

## 2021-12-05 NOTE — ED Notes (Signed)
Patient transported to CT 

## 2021-12-05 NOTE — ED Provider Notes (Signed)
[name] DEPT Provider Note   CSN: [id number] Arrival date & time: 12/05/21  9528     History  CC: Weakness  [name] is a 87 y.o. male with history of type 2 diabetes, hypertension, stage III kidney disease, reflux, peripheral vascular disease, dementia, hyperlipidemia, coronary disease status post CABG, chronic hyponatremia, presented to ED with generalized weakness.  The patient is presenting from home by EMS.  Supplemental history provided by paramedics.  They report that the patient has had progressive weakness for the past few days.  Family reported they could not get him out of his wheelchair today.  They wanted him sent to the ER for general evaluation.  Patient himself speaks very little at baseline.  He denies to me that he has a headache, chest pain or pressure, sore throat, abdominal pain, or dysuria.  Per my review of the external records, he was most recently hospitalized in September 2022, last year, for a left lower lobe pneumonia and generalized weakness at that time.  He was treated with antibiotics and discharged home.  There was some concern for acute metabolic encephalopathy with failure to thrive at that time.  He also was noted to have hyponatremia with a baseline sodium of approximately 130.  Update, his wife later presented to the ED and provided supplemental history.  She reports that the patient had a viral URI type symptoms yesterday, had a lot of congestion and dripping from his nose, had a mild fever overnight.  She said he slept very poorly after she had given him Allegra as an antihistamine last night.  Staff today seemed very fatigued and had difficulty getting up even getting into the wheelchair, she was concerned about his weakness and brought him in.  She feels that since arriving in the ER the patient looks significantly better, and basically looks back to himself.  HPI     Home Medications Prior to Admission medications    Medication Sig Start Date End Date Taking? Authorizing Provider  amoxicillin-clavulanate (AUGMENTIN) 875-125 MG tablet Take 1 tablet by mouth every 12 (twelve) hours for 7 days. 12/05/21 12/12/21 Yes Yohance Hathorne, Carola Rhine, MD  fluconazole (DIFLUCAN) 200 MG tablet Take 1 tablet (200 mg total) by mouth once for 1 dose. 12/05/21 12/05/21 Yes Akeisha Lagerquist, Carola Rhine, MD  ALEVE 220 MG tablet Take 220-440 mg by mouth 2 (two) times daily as needed (for pain).    [provider]  amoxicillin (AMOXIL) 500 MG capsule Take 2,000 mg by mouth See admin instructions. Take 2,000 mg by mouth 30 minutes prior to dental appointments Patient not taking: Reported on 09/20/2021 07/24/18   [provider]  clopidogrel (PLAVIX) 75 MG tablet Take 75 mg by mouth daily with supper. Patient not taking: Reported on 09/20/2021    [provider]  doxycycline (VIBRA-TABS) 100 MG tablet Take 1 tablet (100 mg total) by mouth every 12 (twelve) hours. Patient not taking: Reported on 09/20/2021 07/13/21   Geradine Girt, DO  glucose blood [facility] VERIO) test strip Check sugars daily Dx: E11.49 09/11/18   [provider]  magnesium oxide (MAG-OX) 400 (240 Mg) MG tablet Take 1 tablet (400 mg total) by mouth daily. 07/14/21   Geradine Girt, DO  metFORMIN (GLUCOPHAGE) 1000 MG tablet Take 1 tablet (1,000 mg total) by mouth daily with supper. 07/13/21   Geradine Girt, DO  metoprolol succinate (TOPROL-XL) 25 MG 24 hr tablet Take 25 mg by mouth daily with supper.  [provider]  Misc. Devices [facility]) MISC Dx: Muscle Weakness 11/27/19   [provider]  nystatin (MYCOSTATIN/NYSTOP) powder SMARTSIG:1 Topical Daily PRN 07/29/21   [provider]  OneTouch Delica Lancets 62B MISC Pt to check BS daily  Dx: E11.49 02/24/20   [provider]  pantoprazole (PROTONIX) 40 MG tablet Take 40 mg by mouth daily before supper. 04/14/20   [provider]  polyethylene glycol (MIRALAX  / GLYCOLAX) 17 g packet Take 17 g by mouth daily as needed for mild constipation. 07/13/21   Geradine Girt, DO  ramipril (ALTACE) 5 MG capsule Take 5 mg by mouth daily. 09/10/21   [provider]      Allergies    Atorvastatin, Rosuvastatin, Simvastatin, and Sulfonamide derivatives    Review of Systems   Review of Systems  Physical Exam Updated Vital Signs BP (!) 148/71 (BP Location: Right Arm)    Pulse 78    Temp 98.1 F (36.7 C)    Resp 16    Ht 6\' 2"  (1.88 m)    Wt 86.2 kg    SpO2 99%    BMI 24.39 kg/m  Physical Exam Constitutional:      General: He is not in acute distress.    Comments: Soft-spoken, slow to speak  HENT:     Head: Normocephalic and atraumatic.  Eyes:     Conjunctiva/sclera: Conjunctivae normal.     Pupils: Pupils are equal, round, and reactive to light.  Cardiovascular:     Rate and Rhythm: Normal rate and regular rhythm.     Pulses: Normal pulses.  Pulmonary:     Effort: Pulmonary effort is normal. No respiratory distress.  Abdominal:     General: There is no distension.     Tenderness: There is no abdominal tenderness. There is no guarding or rebound.  Skin:    General: Skin is warm and dry.  Neurological:     General: No focal deficit present.     Mental Status: He is alert. Mental status is at baseline.    ED Results / Procedures / Treatments   Labs (all labs ordered are listed, but only abnormal results are displayed) Labs Reviewed  COMPREHENSIVE METABOLIC PANEL - Abnormal; Notable for the following components:      Result Value   Sodium 129 (*)    Chloride 95 (*)    Glucose, Bld 145 (*)    BUN 28 (*)    GFR, Estimated 57 (*)    All other components within normal limits  CBC WITH DIFFERENTIAL/PLATELET - Abnormal; Notable for the following components:   WBC 14.2 (*)    Platelets 121 (*)    Neutro Abs 11.3 (*)    All other components within normal limits  CBG MONITORING, ED - Abnormal; Notable for the following components:    Glucose-Capillary 140 (*)    All other components within normal limits  RESP PANEL BY RT-PCR (FLU A&B, COVID) ARPGX2  CULTURE, BLOOD (SINGLE)  URINALYSIS, ROUTINE W REFLEX MICROSCOPIC  TROPONIN I (HIGH SENSITIVITY)    EKG EKG Interpretation  Date/Time:  Sunday December 05 2021 09:59:46 EST Ventricular Rate:  63 PR Interval:    QRS Duration: 87 QT Interval:  415 QTC Calculation: 425 R Axis:   73 Text Interpretation: Sinus rhythm Possible old anterior infarct, no significant changes from prior tracing Sept 9 2022, no STEMI Confirmed by Octaviano Glow [phone]) on 12/05/2021 10:04:57 AM  Radiology DG Chest 2 View  Result Date:  12/05/2021 CLINICAL DATA:  Generalized weakness. EXAM: CHEST - 2 VIEW COMPARISON:  07/09/2021 FINDINGS: AP and lateral views of the chest are limited by over penetration on the frontal projection. Within this limitation there is no evidence for focal airspace consolidation or pulmonary edema. No pleural effusion. Chronic interstitial changes at the left base are similar to prior. The cardiopericardial silhouette is within normal limits for size. Telemetry leads overlie the chest. IMPRESSION: No active cardiopulmonary disease. Electronically Signed   By: Misty Stanley M.D.   On: 12/05/2021 10:57    Procedures Procedures    Medications Ordered in ED Medications  sodium chloride 0.9 % bolus 500 mL ( Intravenous Stopped 12/05/21 1343)    ED Course/ Medical Decision Making/ A&P Clinical Course as of 12/05/21 1542  Sun Dec 05, 2021  1043 Na 129 near baseline levels with chronic hyponatremia [MT]  1209 Asked nursing to perform In-N-Out catheter as the patient had an incontinent episode, which is not unusual for him [MT]  1259 UA without evidence of infection [MT]  1329 Supplemental history is now provided by the patient's wife is at bedside.  Reviewed his work-up which was largely unremarkable, aside from a leukocytosis.  She confirms he is not on steroids.  The history  she provided to me does seem consistent with a viral syndrome, given that he had a head cold.  Is not clear if he may have a sinusitis.  We also discussed the possibility of a pneumonia that would not be visible on a simple x-ray film, given that he does have a cough.  I think is reasonable to provide an Augmentin prescription, with a watch and wait instructions at home.  She verbalized agreement and understanding.  They can follow-up with his PCP.  My suspicion for sepsis is quite low at this time.  Patient is clinically extremely well-appearing [MT]    Clinical Course User Index [MT] Lataja Newland, Carola Rhine, MD                           Medical Decision Making Amount and/or Complexity of Data Reviewed Labs: ordered. Radiology: ordered. ECG/medicine tests: ordered.  Risk Prescription drug management.   This patient presents to the ED with concern for generalized weakness.  This involves an extensive number of treatment options, and is a complaint that carries with it a high risk of complications and morbidity.  The differential diagnosis includes symptomatic hyponatremia versus dehydration versus electrolyte derangement versus anemia versus dementia versus atypical ACS versus other.  No other localizing stroke symptoms to suggest acute stroke.  Co-morbidities that complicate the patient evaluation: Dementia limits patient's history   Additional history obtained from EMS paramedics and patient's arrival  External records from outside source obtained and reviewed including hospital discharge summary from September 2022 as noted in history above  I ordered and personally interpreted labs.  The pertinent results include: Hemoglobin within normal limits.  Glucose within normal limits.  UA without sign of infection.  COVID and flu are negative.  Initial troponin 5.  White blood cell count minorly elevated 14.2.  No acute anemia.  No AKI.  I ordered imaging studies including x-ray of the chest I  independently visualized and interpreted imaging which showed no focal infiltrate I agree with the radiologist interpretation  The patient was maintained on a cardiac monitor.  I personally viewed and interpreted the cardiac monitored which showed an underlying rhythm of: Regular rhythm  Per my interpretation  the patient's ECG shows normal sinus rhythm with no acute ischemic findings  Test Considered:  -Do not feel that CT of the abdomen was indicated emergently as he has no abdominal or GI symptoms, no abdominal tenderness or pain  Social Determinants of Health:Dementia limits patient reliability and history  Dispostion:  After consideration of the diagnostic results and the patients response to treatment, I feel that the patent would benefit from outpatient PCP follow-up.  Plan of care discussed with the patient's wife is present at the bedside and who lives with him.  My overall suspicion for sepsis is fairly low at this time.  A watch and wait antibiotic prescription was provided with Augmentin for home.  They can follow-up with her doctor.  Otherwise he is well-appearing on my reassessment, and the wife agrees, the patient is feeling well and wanting to go home.         Final Clinical Impression(s) / ED Diagnoses Final diagnoses:  Weakness    Rx / DC Orders ED Discharge Orders          Ordered    amoxicillin-clavulanate (AUGMENTIN) 875-125 MG tablet  Every 12 hours        12/05/21 1328    fluconazole (DIFLUCAN) 200 MG tablet   Once        12/05/21 1329              Wyvonnia Dusky, MD 12/05/21 1542

## 2021-12-05 NOTE — ED Triage Notes (Signed)
Pt came [facility] from home where family called stating the patient had generalized weakness and when they got him in his wheelchair he was diaphoretic. Family concerned for infection.

## 2021-12-14 DIAGNOSIS — M6281 Muscle weakness (generalized): Secondary | ICD-10-CM | POA: Diagnosis not present

## 2021-12-14 DIAGNOSIS — R278 Other lack of coordination: Secondary | ICD-10-CM | POA: Diagnosis not present

## 2021-12-15 DIAGNOSIS — R2689 Other abnormalities of gait and mobility: Secondary | ICD-10-CM | POA: Diagnosis not present

## 2021-12-15 DIAGNOSIS — R296 Repeated falls: Secondary | ICD-10-CM | POA: Diagnosis not present

## 2021-12-15 DIAGNOSIS — M6281 Muscle weakness (generalized): Secondary | ICD-10-CM | POA: Diagnosis not present

## 2021-12-16 DIAGNOSIS — R278 Other lack of coordination: Secondary | ICD-10-CM | POA: Diagnosis not present

## 2021-12-16 DIAGNOSIS — M6281 Muscle weakness (generalized): Secondary | ICD-10-CM | POA: Diagnosis not present

## 2021-12-20 DIAGNOSIS — R2689 Other abnormalities of gait and mobility: Secondary | ICD-10-CM | POA: Diagnosis not present

## 2021-12-20 DIAGNOSIS — R296 Repeated falls: Secondary | ICD-10-CM | POA: Diagnosis not present

## 2021-12-20 DIAGNOSIS — M6281 Muscle weakness (generalized): Secondary | ICD-10-CM | POA: Diagnosis not present

## 2021-12-21 DIAGNOSIS — M6281 Muscle weakness (generalized): Secondary | ICD-10-CM | POA: Diagnosis not present

## 2021-12-21 DIAGNOSIS — R278 Other lack of coordination: Secondary | ICD-10-CM | POA: Diagnosis not present

## 2021-12-22 DIAGNOSIS — R296 Repeated falls: Secondary | ICD-10-CM | POA: Diagnosis not present

## 2021-12-22 DIAGNOSIS — R2689 Other abnormalities of gait and mobility: Secondary | ICD-10-CM | POA: Diagnosis not present

## 2021-12-22 DIAGNOSIS — M6281 Muscle weakness (generalized): Secondary | ICD-10-CM | POA: Diagnosis not present

## 2021-12-27 DIAGNOSIS — R2689 Other abnormalities of gait and mobility: Secondary | ICD-10-CM | POA: Diagnosis not present

## 2021-12-27 DIAGNOSIS — M6281 Muscle weakness (generalized): Secondary | ICD-10-CM | POA: Diagnosis not present

## 2021-12-27 DIAGNOSIS — R296 Repeated falls: Secondary | ICD-10-CM | POA: Diagnosis not present

## 2021-12-28 DIAGNOSIS — M6281 Muscle weakness (generalized): Secondary | ICD-10-CM | POA: Diagnosis not present

## 2021-12-28 DIAGNOSIS — R278 Other lack of coordination: Secondary | ICD-10-CM | POA: Diagnosis not present

## 2021-12-29 DIAGNOSIS — M6281 Muscle weakness (generalized): Secondary | ICD-10-CM | POA: Diagnosis not present

## 2021-12-29 DIAGNOSIS — R296 Repeated falls: Secondary | ICD-10-CM | POA: Diagnosis not present

## 2021-12-29 DIAGNOSIS — R2689 Other abnormalities of gait and mobility: Secondary | ICD-10-CM | POA: Diagnosis not present

## 2021-12-30 DIAGNOSIS — M6281 Muscle weakness (generalized): Secondary | ICD-10-CM | POA: Diagnosis not present

## 2021-12-30 DIAGNOSIS — R278 Other lack of coordination: Secondary | ICD-10-CM | POA: Diagnosis not present

## 2022-01-03 DIAGNOSIS — R2689 Other abnormalities of gait and mobility: Secondary | ICD-10-CM | POA: Diagnosis not present

## 2022-01-03 DIAGNOSIS — R296 Repeated falls: Secondary | ICD-10-CM | POA: Diagnosis not present

## 2022-01-03 DIAGNOSIS — M6281 Muscle weakness (generalized): Secondary | ICD-10-CM | POA: Diagnosis not present

## 2022-01-04 DIAGNOSIS — M6281 Muscle weakness (generalized): Secondary | ICD-10-CM | POA: Diagnosis not present

## 2022-01-04 DIAGNOSIS — R278 Other lack of coordination: Secondary | ICD-10-CM | POA: Diagnosis not present

## 2022-01-05 DIAGNOSIS — R2689 Other abnormalities of gait and mobility: Secondary | ICD-10-CM | POA: Diagnosis not present

## 2022-01-05 DIAGNOSIS — M6281 Muscle weakness (generalized): Secondary | ICD-10-CM | POA: Diagnosis not present

## 2022-01-05 DIAGNOSIS — R296 Repeated falls: Secondary | ICD-10-CM | POA: Diagnosis not present

## 2022-01-06 DIAGNOSIS — R278 Other lack of coordination: Secondary | ICD-10-CM | POA: Diagnosis not present

## 2022-01-06 DIAGNOSIS — M6281 Muscle weakness (generalized): Secondary | ICD-10-CM | POA: Diagnosis not present

## 2022-01-11 DIAGNOSIS — R278 Other lack of coordination: Secondary | ICD-10-CM | POA: Diagnosis not present

## 2022-01-11 DIAGNOSIS — M6281 Muscle weakness (generalized): Secondary | ICD-10-CM | POA: Diagnosis not present

## 2022-01-12 DIAGNOSIS — R296 Repeated falls: Secondary | ICD-10-CM | POA: Diagnosis not present

## 2022-01-12 DIAGNOSIS — M6281 Muscle weakness (generalized): Secondary | ICD-10-CM | POA: Diagnosis not present

## 2022-01-12 DIAGNOSIS — R2689 Other abnormalities of gait and mobility: Secondary | ICD-10-CM | POA: Diagnosis not present

## 2022-01-13 DIAGNOSIS — R278 Other lack of coordination: Secondary | ICD-10-CM | POA: Diagnosis not present

## 2022-01-13 DIAGNOSIS — M6281 Muscle weakness (generalized): Secondary | ICD-10-CM | POA: Diagnosis not present

## 2022-01-17 DIAGNOSIS — R2689 Other abnormalities of gait and mobility: Secondary | ICD-10-CM | POA: Diagnosis not present

## 2022-01-17 DIAGNOSIS — R296 Repeated falls: Secondary | ICD-10-CM | POA: Diagnosis not present

## 2022-01-17 DIAGNOSIS — M6281 Muscle weakness (generalized): Secondary | ICD-10-CM | POA: Diagnosis not present

## 2022-01-18 DIAGNOSIS — M6281 Muscle weakness (generalized): Secondary | ICD-10-CM | POA: Diagnosis not present

## 2022-01-18 DIAGNOSIS — R278 Other lack of coordination: Secondary | ICD-10-CM | POA: Diagnosis not present

## 2022-01-20 DIAGNOSIS — M6281 Muscle weakness (generalized): Secondary | ICD-10-CM | POA: Diagnosis not present

## 2022-01-20 DIAGNOSIS — R278 Other lack of coordination: Secondary | ICD-10-CM | POA: Diagnosis not present

## 2022-01-24 DIAGNOSIS — R296 Repeated falls: Secondary | ICD-10-CM | POA: Diagnosis not present

## 2022-01-24 DIAGNOSIS — M6281 Muscle weakness (generalized): Secondary | ICD-10-CM | POA: Diagnosis not present

## 2022-01-24 DIAGNOSIS — R2689 Other abnormalities of gait and mobility: Secondary | ICD-10-CM | POA: Diagnosis not present

## 2022-01-25 DIAGNOSIS — R278 Other lack of coordination: Secondary | ICD-10-CM | POA: Diagnosis not present

## 2022-01-25 DIAGNOSIS — M6281 Muscle weakness (generalized): Secondary | ICD-10-CM | POA: Diagnosis not present

## 2022-01-26 DIAGNOSIS — M6281 Muscle weakness (generalized): Secondary | ICD-10-CM | POA: Diagnosis not present

## 2022-01-26 DIAGNOSIS — R2689 Other abnormalities of gait and mobility: Secondary | ICD-10-CM | POA: Diagnosis not present

## 2022-01-26 DIAGNOSIS — R296 Repeated falls: Secondary | ICD-10-CM | POA: Diagnosis not present

## 2022-01-31 DIAGNOSIS — M6281 Muscle weakness (generalized): Secondary | ICD-10-CM | POA: Diagnosis not present

## 2022-01-31 DIAGNOSIS — R296 Repeated falls: Secondary | ICD-10-CM | POA: Diagnosis not present

## 2022-01-31 DIAGNOSIS — R2689 Other abnormalities of gait and mobility: Secondary | ICD-10-CM | POA: Diagnosis not present

## 2022-02-01 DIAGNOSIS — R278 Other lack of coordination: Secondary | ICD-10-CM | POA: Diagnosis not present

## 2022-02-01 DIAGNOSIS — M6281 Muscle weakness (generalized): Secondary | ICD-10-CM | POA: Diagnosis not present

## 2022-02-02 DIAGNOSIS — R296 Repeated falls: Secondary | ICD-10-CM | POA: Diagnosis not present

## 2022-02-02 DIAGNOSIS — R2689 Other abnormalities of gait and mobility: Secondary | ICD-10-CM | POA: Diagnosis not present

## 2022-02-02 DIAGNOSIS — M6281 Muscle weakness (generalized): Secondary | ICD-10-CM | POA: Diagnosis not present

## 2022-02-03 DIAGNOSIS — M6281 Muscle weakness (generalized): Secondary | ICD-10-CM | POA: Diagnosis not present

## 2022-02-03 DIAGNOSIS — R278 Other lack of coordination: Secondary | ICD-10-CM | POA: Diagnosis not present

## 2022-02-07 DIAGNOSIS — R296 Repeated falls: Secondary | ICD-10-CM | POA: Diagnosis not present

## 2022-02-07 DIAGNOSIS — R2689 Other abnormalities of gait and mobility: Secondary | ICD-10-CM | POA: Diagnosis not present

## 2022-02-07 DIAGNOSIS — M6281 Muscle weakness (generalized): Secondary | ICD-10-CM | POA: Diagnosis not present

## 2022-02-08 DIAGNOSIS — R278 Other lack of coordination: Secondary | ICD-10-CM | POA: Diagnosis not present

## 2022-02-08 DIAGNOSIS — M6281 Muscle weakness (generalized): Secondary | ICD-10-CM | POA: Diagnosis not present

## 2022-02-09 DIAGNOSIS — R296 Repeated falls: Secondary | ICD-10-CM | POA: Diagnosis not present

## 2022-02-09 DIAGNOSIS — R2689 Other abnormalities of gait and mobility: Secondary | ICD-10-CM | POA: Diagnosis not present

## 2022-02-09 DIAGNOSIS — M6281 Muscle weakness (generalized): Secondary | ICD-10-CM | POA: Diagnosis not present

## 2022-02-14 DIAGNOSIS — R2689 Other abnormalities of gait and mobility: Secondary | ICD-10-CM | POA: Diagnosis not present

## 2022-02-14 DIAGNOSIS — M6281 Muscle weakness (generalized): Secondary | ICD-10-CM | POA: Diagnosis not present

## 2022-02-14 DIAGNOSIS — R296 Repeated falls: Secondary | ICD-10-CM | POA: Diagnosis not present

## 2022-02-15 DIAGNOSIS — R278 Other lack of coordination: Secondary | ICD-10-CM | POA: Diagnosis not present

## 2022-02-15 DIAGNOSIS — M6281 Muscle weakness (generalized): Secondary | ICD-10-CM | POA: Diagnosis not present

## 2022-02-16 DIAGNOSIS — R296 Repeated falls: Secondary | ICD-10-CM | POA: Diagnosis not present

## 2022-02-16 DIAGNOSIS — R2689 Other abnormalities of gait and mobility: Secondary | ICD-10-CM | POA: Diagnosis not present

## 2022-02-16 DIAGNOSIS — M6281 Muscle weakness (generalized): Secondary | ICD-10-CM | POA: Diagnosis not present

## 2022-02-17 DIAGNOSIS — R278 Other lack of coordination: Secondary | ICD-10-CM | POA: Diagnosis not present

## 2022-02-17 DIAGNOSIS — M6281 Muscle weakness (generalized): Secondary | ICD-10-CM | POA: Diagnosis not present

## 2022-02-21 DIAGNOSIS — R2689 Other abnormalities of gait and mobility: Secondary | ICD-10-CM | POA: Diagnosis not present

## 2022-02-21 DIAGNOSIS — M6281 Muscle weakness (generalized): Secondary | ICD-10-CM | POA: Diagnosis not present

## 2022-02-21 DIAGNOSIS — R296 Repeated falls: Secondary | ICD-10-CM | POA: Diagnosis not present

## 2022-02-22 DIAGNOSIS — R278 Other lack of coordination: Secondary | ICD-10-CM | POA: Diagnosis not present

## 2022-02-22 DIAGNOSIS — M6281 Muscle weakness (generalized): Secondary | ICD-10-CM | POA: Diagnosis not present

## 2022-02-24 DIAGNOSIS — R278 Other lack of coordination: Secondary | ICD-10-CM | POA: Diagnosis not present

## 2022-02-24 DIAGNOSIS — M6281 Muscle weakness (generalized): Secondary | ICD-10-CM | POA: Diagnosis not present

## 2022-02-25 DIAGNOSIS — M6281 Muscle weakness (generalized): Secondary | ICD-10-CM | POA: Diagnosis not present

## 2022-02-25 DIAGNOSIS — R296 Repeated falls: Secondary | ICD-10-CM | POA: Diagnosis not present

## 2022-02-25 DIAGNOSIS — R2689 Other abnormalities of gait and mobility: Secondary | ICD-10-CM | POA: Diagnosis not present

## 2022-02-28 DIAGNOSIS — R296 Repeated falls: Secondary | ICD-10-CM | POA: Diagnosis not present

## 2022-02-28 DIAGNOSIS — R2689 Other abnormalities of gait and mobility: Secondary | ICD-10-CM | POA: Diagnosis not present

## 2022-02-28 DIAGNOSIS — M6281 Muscle weakness (generalized): Secondary | ICD-10-CM | POA: Diagnosis not present

## 2022-03-01 DIAGNOSIS — R278 Other lack of coordination: Secondary | ICD-10-CM | POA: Diagnosis not present

## 2022-03-01 DIAGNOSIS — M6281 Muscle weakness (generalized): Secondary | ICD-10-CM | POA: Diagnosis not present

## 2022-03-02 DIAGNOSIS — M6281 Muscle weakness (generalized): Secondary | ICD-10-CM | POA: Diagnosis not present

## 2022-03-02 DIAGNOSIS — Z20822 Contact with and (suspected) exposure to covid-19: Secondary | ICD-10-CM | POA: Diagnosis not present

## 2022-03-02 DIAGNOSIS — R296 Repeated falls: Secondary | ICD-10-CM | POA: Diagnosis not present

## 2022-03-02 DIAGNOSIS — R2689 Other abnormalities of gait and mobility: Secondary | ICD-10-CM | POA: Diagnosis not present

## 2022-03-07 DIAGNOSIS — R296 Repeated falls: Secondary | ICD-10-CM | POA: Diagnosis not present

## 2022-03-07 DIAGNOSIS — M6281 Muscle weakness (generalized): Secondary | ICD-10-CM | POA: Diagnosis not present

## 2022-03-07 DIAGNOSIS — R2689 Other abnormalities of gait and mobility: Secondary | ICD-10-CM | POA: Diagnosis not present

## 2022-03-09 DIAGNOSIS — M6281 Muscle weakness (generalized): Secondary | ICD-10-CM | POA: Diagnosis not present

## 2022-03-09 DIAGNOSIS — R2689 Other abnormalities of gait and mobility: Secondary | ICD-10-CM | POA: Diagnosis not present

## 2022-03-09 DIAGNOSIS — R296 Repeated falls: Secondary | ICD-10-CM | POA: Diagnosis not present

## 2022-03-10 DIAGNOSIS — M6281 Muscle weakness (generalized): Secondary | ICD-10-CM | POA: Diagnosis not present

## 2022-03-10 DIAGNOSIS — R278 Other lack of coordination: Secondary | ICD-10-CM | POA: Diagnosis not present

## 2022-03-14 DIAGNOSIS — M6281 Muscle weakness (generalized): Secondary | ICD-10-CM | POA: Diagnosis not present

## 2022-03-14 DIAGNOSIS — R296 Repeated falls: Secondary | ICD-10-CM | POA: Diagnosis not present

## 2022-03-14 DIAGNOSIS — R2689 Other abnormalities of gait and mobility: Secondary | ICD-10-CM | POA: Diagnosis not present

## 2022-03-15 DIAGNOSIS — R278 Other lack of coordination: Secondary | ICD-10-CM | POA: Diagnosis not present

## 2022-03-15 DIAGNOSIS — M6281 Muscle weakness (generalized): Secondary | ICD-10-CM | POA: Diagnosis not present

## 2022-03-16 DIAGNOSIS — R296 Repeated falls: Secondary | ICD-10-CM | POA: Diagnosis not present

## 2022-03-16 DIAGNOSIS — R2689 Other abnormalities of gait and mobility: Secondary | ICD-10-CM | POA: Diagnosis not present

## 2022-03-16 DIAGNOSIS — M6281 Muscle weakness (generalized): Secondary | ICD-10-CM | POA: Diagnosis not present

## 2022-03-21 DIAGNOSIS — R296 Repeated falls: Secondary | ICD-10-CM | POA: Diagnosis not present

## 2022-03-21 DIAGNOSIS — M6281 Muscle weakness (generalized): Secondary | ICD-10-CM | POA: Diagnosis not present

## 2022-03-21 DIAGNOSIS — R2689 Other abnormalities of gait and mobility: Secondary | ICD-10-CM | POA: Diagnosis not present

## 2022-03-22 DIAGNOSIS — M6281 Muscle weakness (generalized): Secondary | ICD-10-CM | POA: Diagnosis not present

## 2022-03-22 DIAGNOSIS — R278 Other lack of coordination: Secondary | ICD-10-CM | POA: Diagnosis not present

## 2022-03-23 DIAGNOSIS — R296 Repeated falls: Secondary | ICD-10-CM | POA: Diagnosis not present

## 2022-03-23 DIAGNOSIS — M6281 Muscle weakness (generalized): Secondary | ICD-10-CM | POA: Diagnosis not present

## 2022-03-23 DIAGNOSIS — R2689 Other abnormalities of gait and mobility: Secondary | ICD-10-CM | POA: Diagnosis not present

## 2022-03-24 DIAGNOSIS — R278 Other lack of coordination: Secondary | ICD-10-CM | POA: Diagnosis not present

## 2022-03-24 DIAGNOSIS — M6281 Muscle weakness (generalized): Secondary | ICD-10-CM | POA: Diagnosis not present

## 2022-03-29 DIAGNOSIS — R278 Other lack of coordination: Secondary | ICD-10-CM | POA: Diagnosis not present

## 2022-03-29 DIAGNOSIS — M6281 Muscle weakness (generalized): Secondary | ICD-10-CM | POA: Diagnosis not present

## 2022-03-30 DIAGNOSIS — R2689 Other abnormalities of gait and mobility: Secondary | ICD-10-CM | POA: Diagnosis not present

## 2022-03-30 DIAGNOSIS — M6281 Muscle weakness (generalized): Secondary | ICD-10-CM | POA: Diagnosis not present

## 2022-03-30 DIAGNOSIS — R296 Repeated falls: Secondary | ICD-10-CM | POA: Diagnosis not present

## 2022-03-31 DIAGNOSIS — M6281 Muscle weakness (generalized): Secondary | ICD-10-CM | POA: Diagnosis not present

## 2022-03-31 DIAGNOSIS — R278 Other lack of coordination: Secondary | ICD-10-CM | POA: Diagnosis not present

## 2022-04-01 ENCOUNTER — Ambulatory Visit: Payer: Medicare Other | Admitting: Podiatry

## 2022-04-04 DIAGNOSIS — M6281 Muscle weakness (generalized): Secondary | ICD-10-CM | POA: Diagnosis not present

## 2022-04-04 DIAGNOSIS — R2689 Other abnormalities of gait and mobility: Secondary | ICD-10-CM | POA: Diagnosis not present

## 2022-04-04 DIAGNOSIS — R296 Repeated falls: Secondary | ICD-10-CM | POA: Diagnosis not present

## 2022-04-05 DIAGNOSIS — M6281 Muscle weakness (generalized): Secondary | ICD-10-CM | POA: Diagnosis not present

## 2022-04-05 DIAGNOSIS — R278 Other lack of coordination: Secondary | ICD-10-CM | POA: Diagnosis not present

## 2022-04-06 DIAGNOSIS — R2689 Other abnormalities of gait and mobility: Secondary | ICD-10-CM | POA: Diagnosis not present

## 2022-04-06 DIAGNOSIS — R296 Repeated falls: Secondary | ICD-10-CM | POA: Diagnosis not present

## 2022-04-06 DIAGNOSIS — M6281 Muscle weakness (generalized): Secondary | ICD-10-CM | POA: Diagnosis not present

## 2022-04-07 DIAGNOSIS — R278 Other lack of coordination: Secondary | ICD-10-CM | POA: Diagnosis not present

## 2022-04-07 DIAGNOSIS — M6281 Muscle weakness (generalized): Secondary | ICD-10-CM | POA: Diagnosis not present

## 2022-04-08 DIAGNOSIS — R1312 Dysphagia, oropharyngeal phase: Secondary | ICD-10-CM | POA: Diagnosis not present

## 2022-04-08 DIAGNOSIS — R1314 Dysphagia, pharyngoesophageal phase: Secondary | ICD-10-CM | POA: Diagnosis not present

## 2022-04-08 DIAGNOSIS — R41841 Cognitive communication deficit: Secondary | ICD-10-CM | POA: Diagnosis not present

## 2022-04-08 NOTE — Patient Outreach (Signed)
Received an insurance referral notification for Mr. Dillenburg ---. I have assigned Valente Jaelyn, RN to call for follow up and determine if there are any Case Management needs.    [facility], [place], [place] Management [phone]

## 2022-04-11 DIAGNOSIS — R41841 Cognitive communication deficit: Secondary | ICD-10-CM | POA: Diagnosis not present

## 2022-04-11 DIAGNOSIS — R296 Repeated falls: Secondary | ICD-10-CM | POA: Diagnosis not present

## 2022-04-11 DIAGNOSIS — R1312 Dysphagia, oropharyngeal phase: Secondary | ICD-10-CM | POA: Diagnosis not present

## 2022-04-11 DIAGNOSIS — R1314 Dysphagia, pharyngoesophageal phase: Secondary | ICD-10-CM | POA: Diagnosis not present

## 2022-04-11 DIAGNOSIS — M6281 Muscle weakness (generalized): Secondary | ICD-10-CM | POA: Diagnosis not present

## 2022-04-11 DIAGNOSIS — R2689 Other abnormalities of gait and mobility: Secondary | ICD-10-CM | POA: Diagnosis not present

## 2022-04-12 ENCOUNTER — Other Ambulatory Visit: Payer: Self-pay | Admitting: *Deleted

## 2022-04-12 DIAGNOSIS — M6281 Muscle weakness (generalized): Secondary | ICD-10-CM | POA: Diagnosis not present

## 2022-04-12 DIAGNOSIS — R278 Other lack of coordination: Secondary | ICD-10-CM | POA: Diagnosis not present

## 2022-04-12 NOTE — Patient Outreach (Signed)
[place] [facility]) Care Management  04/12/2022  [name] July 31, 1935 [id number]   Referral Date: 6/9 Referral Source: Insurance Referral Reason: Chronic care management (HTN and COPD) Insurance: Medicare   Outreach attempt #1, unsuccessful, HIPAA compliant voice message left.  Spouse has same listed preferred number.   Plan: RN CM will follow up within the next 2-3 business days.  Valente Auryn, RN, MSN, [place] Manager [phone]

## 2022-04-13 DIAGNOSIS — R2689 Other abnormalities of gait and mobility: Secondary | ICD-10-CM | POA: Diagnosis not present

## 2022-04-13 DIAGNOSIS — R296 Repeated falls: Secondary | ICD-10-CM | POA: Diagnosis not present

## 2022-04-13 DIAGNOSIS — M6281 Muscle weakness (generalized): Secondary | ICD-10-CM | POA: Diagnosis not present

## 2022-04-14 ENCOUNTER — Other Ambulatory Visit: Payer: Self-pay | Admitting: *Deleted

## 2022-04-14 ENCOUNTER — Telehealth (HOSPITAL_COMMUNITY): Payer: Self-pay

## 2022-04-14 ENCOUNTER — Other Ambulatory Visit (HOSPITAL_COMMUNITY): Payer: Self-pay

## 2022-04-14 DIAGNOSIS — R059 Cough, unspecified: Secondary | ICD-10-CM

## 2022-04-14 DIAGNOSIS — R1310 Dysphagia, unspecified: Secondary | ICD-10-CM

## 2022-04-14 NOTE — Patient Outreach (Addendum)
[place] [facility]) Care Management  04/14/2022  [name] 1934-11-06 [id number]   Referral Date: 6/9 Referral Source: Insurance Referral Reason: Chronic care management (HTN and COPD) Insurance: Medicare   Outreach attempt #2, unsuccessful.  HIPPA compliant voice message left on listed home number, unable to leave message on listed mobile number.  Plan: RN CM will send outreach letter and follow up within the next 5-7 business days.   UPDATE @ 1450:  Spoke with wife, declined to participate at this time.  She will contact this care manager should they reconsider decision.  They have hired caregivers in the home to care for member, denies need for additional services.  Will close case at this time.  Valente Aycen, RN, MSN, [place] Manager [phone]

## 2022-04-14 NOTE — Telephone Encounter (Signed)
Called and spoke with someone in the [place] residence - she stated to call back around 1:30 when wife of patient returns to schedule Modified Barium Swallow.

## 2022-04-15 ENCOUNTER — Ambulatory Visit (INDEPENDENT_AMBULATORY_CARE_PROVIDER_SITE_OTHER): Payer: Medicare Other | Admitting: Podiatry

## 2022-04-15 ENCOUNTER — Encounter: Payer: Self-pay | Admitting: Podiatry

## 2022-04-15 DIAGNOSIS — E1151 Type 2 diabetes mellitus with diabetic peripheral angiopathy without gangrene: Secondary | ICD-10-CM | POA: Diagnosis not present

## 2022-04-15 DIAGNOSIS — M79675 Pain in left toe(s): Secondary | ICD-10-CM

## 2022-04-15 DIAGNOSIS — N1831 Chronic kidney disease, stage 3a: Secondary | ICD-10-CM | POA: Diagnosis not present

## 2022-04-15 DIAGNOSIS — M79674 Pain in right toe(s): Secondary | ICD-10-CM | POA: Diagnosis not present

## 2022-04-15 DIAGNOSIS — B351 Tinea unguium: Secondary | ICD-10-CM | POA: Diagnosis not present

## 2022-04-15 NOTE — Progress Notes (Signed)
This patient returns to my office for at risk foot care.  This patient requires this care by a professional since this patient will be at risk due to having diabetes and CKD.  This patient is unable to cut nails himself since the patient cannot reach his nails.These nails are painful walking and wearing shoes.  He presents in a wheelchair with his wife Romie Minus. This patient presents for at risk foot care today.  General Appearance  Alert, conversant and in no acute stress.  Vascular  Dorsalis pedis and posterior tibial  pulses are  weakly palpable  bilaterally.  Capillary return is within normal limits  bilaterally. Temperature is within normal limits  bilaterally.  Neurologic  Senn-Weinstein monofilament wire test within normal limits  bilaterally. Muscle power within normal limits bilaterally.  Nails Thick disfigured discolored nails with subungual debris  from hallux to fifth toes bilaterally. No evidence of bacterial infection or drainage bilaterally.  Orthopedic  No limitations of motion  feet .  No crepitus or effusions noted.  No bony pathology or digital deformities noted.  Skin  normotropic skin with no porokeratosis noted bilaterally.  No signs of infections or ulcers noted.     Onychomycosis  Pain in right toes  Pain in left toes  Consent was obtained for treatment procedures.   Mechanical debridement of nails 1-5  bilaterally performed with a nail nipper.  Filed with dremel without incident.    Return office visit   3 months                   Told patient to return for periodic foot care and evaluation due to potential at risk complications.   Gardiner Barefoot DPM

## 2022-04-18 DIAGNOSIS — R296 Repeated falls: Secondary | ICD-10-CM | POA: Diagnosis not present

## 2022-04-18 DIAGNOSIS — R1312 Dysphagia, oropharyngeal phase: Secondary | ICD-10-CM | POA: Diagnosis not present

## 2022-04-18 DIAGNOSIS — M6281 Muscle weakness (generalized): Secondary | ICD-10-CM | POA: Diagnosis not present

## 2022-04-18 DIAGNOSIS — R2689 Other abnormalities of gait and mobility: Secondary | ICD-10-CM | POA: Diagnosis not present

## 2022-04-18 DIAGNOSIS — R1314 Dysphagia, pharyngoesophageal phase: Secondary | ICD-10-CM | POA: Diagnosis not present

## 2022-04-18 DIAGNOSIS — R41841 Cognitive communication deficit: Secondary | ICD-10-CM | POA: Diagnosis not present

## 2022-04-19 DIAGNOSIS — M6281 Muscle weakness (generalized): Secondary | ICD-10-CM | POA: Diagnosis not present

## 2022-04-19 DIAGNOSIS — R278 Other lack of coordination: Secondary | ICD-10-CM | POA: Diagnosis not present

## 2022-04-20 DIAGNOSIS — R296 Repeated falls: Secondary | ICD-10-CM | POA: Diagnosis not present

## 2022-04-20 DIAGNOSIS — R2689 Other abnormalities of gait and mobility: Secondary | ICD-10-CM | POA: Diagnosis not present

## 2022-04-20 DIAGNOSIS — M6281 Muscle weakness (generalized): Secondary | ICD-10-CM | POA: Diagnosis not present

## 2022-04-21 DIAGNOSIS — R278 Other lack of coordination: Secondary | ICD-10-CM | POA: Diagnosis not present

## 2022-04-21 DIAGNOSIS — M6281 Muscle weakness (generalized): Secondary | ICD-10-CM | POA: Diagnosis not present

## 2022-04-25 ENCOUNTER — Ambulatory Visit (HOSPITAL_COMMUNITY): Payer: Medicare Other

## 2022-04-25 ENCOUNTER — Encounter (HOSPITAL_COMMUNITY): Payer: Medicare Other

## 2022-04-25 ENCOUNTER — Encounter (HOSPITAL_COMMUNITY): Payer: Self-pay

## 2022-04-25 DIAGNOSIS — R296 Repeated falls: Secondary | ICD-10-CM | POA: Diagnosis not present

## 2022-04-25 DIAGNOSIS — M6281 Muscle weakness (generalized): Secondary | ICD-10-CM | POA: Diagnosis not present

## 2022-04-25 DIAGNOSIS — R2689 Other abnormalities of gait and mobility: Secondary | ICD-10-CM | POA: Diagnosis not present

## 2022-04-26 DIAGNOSIS — R278 Other lack of coordination: Secondary | ICD-10-CM | POA: Diagnosis not present

## 2022-04-26 DIAGNOSIS — M6281 Muscle weakness (generalized): Secondary | ICD-10-CM | POA: Diagnosis not present

## 2022-04-27 DIAGNOSIS — R2689 Other abnormalities of gait and mobility: Secondary | ICD-10-CM | POA: Diagnosis not present

## 2022-04-27 DIAGNOSIS — M6281 Muscle weakness (generalized): Secondary | ICD-10-CM | POA: Diagnosis not present

## 2022-04-27 DIAGNOSIS — R296 Repeated falls: Secondary | ICD-10-CM | POA: Diagnosis not present

## 2022-04-28 DIAGNOSIS — M6281 Muscle weakness (generalized): Secondary | ICD-10-CM | POA: Diagnosis not present

## 2022-04-28 DIAGNOSIS — R278 Other lack of coordination: Secondary | ICD-10-CM | POA: Diagnosis not present

## 2022-05-02 DIAGNOSIS — R2689 Other abnormalities of gait and mobility: Secondary | ICD-10-CM | POA: Diagnosis not present

## 2022-05-02 DIAGNOSIS — M6281 Muscle weakness (generalized): Secondary | ICD-10-CM | POA: Diagnosis not present

## 2022-05-02 DIAGNOSIS — R296 Repeated falls: Secondary | ICD-10-CM | POA: Diagnosis not present

## 2022-05-04 ENCOUNTER — Ambulatory Visit (HOSPITAL_COMMUNITY): Admission: RE | Admit: 2022-05-04 | Payer: Medicare Other | Source: Ambulatory Visit

## 2022-05-04 ENCOUNTER — Ambulatory Visit (HOSPITAL_COMMUNITY): Payer: Medicare Other

## 2022-05-05 DIAGNOSIS — M6281 Muscle weakness (generalized): Secondary | ICD-10-CM | POA: Diagnosis not present

## 2022-05-05 DIAGNOSIS — R278 Other lack of coordination: Secondary | ICD-10-CM | POA: Diagnosis not present

## 2022-05-09 DIAGNOSIS — M6281 Muscle weakness (generalized): Secondary | ICD-10-CM | POA: Diagnosis not present

## 2022-05-09 DIAGNOSIS — R2689 Other abnormalities of gait and mobility: Secondary | ICD-10-CM | POA: Diagnosis not present

## 2022-05-09 DIAGNOSIS — R296 Repeated falls: Secondary | ICD-10-CM | POA: Diagnosis not present

## 2022-05-10 DIAGNOSIS — R278 Other lack of coordination: Secondary | ICD-10-CM | POA: Diagnosis not present

## 2022-05-10 DIAGNOSIS — M6281 Muscle weakness (generalized): Secondary | ICD-10-CM | POA: Diagnosis not present

## 2022-05-11 DIAGNOSIS — M6281 Muscle weakness (generalized): Secondary | ICD-10-CM | POA: Diagnosis not present

## 2022-05-11 DIAGNOSIS — R296 Repeated falls: Secondary | ICD-10-CM | POA: Diagnosis not present

## 2022-05-11 DIAGNOSIS — R2689 Other abnormalities of gait and mobility: Secondary | ICD-10-CM | POA: Diagnosis not present

## 2022-05-12 DIAGNOSIS — R278 Other lack of coordination: Secondary | ICD-10-CM | POA: Diagnosis not present

## 2022-05-12 DIAGNOSIS — M6281 Muscle weakness (generalized): Secondary | ICD-10-CM | POA: Diagnosis not present

## 2022-05-17 DIAGNOSIS — M6281 Muscle weakness (generalized): Secondary | ICD-10-CM | POA: Diagnosis not present

## 2022-05-17 DIAGNOSIS — R278 Other lack of coordination: Secondary | ICD-10-CM | POA: Diagnosis not present

## 2022-05-18 DIAGNOSIS — R2689 Other abnormalities of gait and mobility: Secondary | ICD-10-CM | POA: Diagnosis not present

## 2022-05-18 DIAGNOSIS — M6281 Muscle weakness (generalized): Secondary | ICD-10-CM | POA: Diagnosis not present

## 2022-05-18 DIAGNOSIS — R296 Repeated falls: Secondary | ICD-10-CM | POA: Diagnosis not present

## 2022-05-19 DIAGNOSIS — M6281 Muscle weakness (generalized): Secondary | ICD-10-CM | POA: Diagnosis not present

## 2022-05-19 DIAGNOSIS — R278 Other lack of coordination: Secondary | ICD-10-CM | POA: Diagnosis not present

## 2022-05-23 ENCOUNTER — Ambulatory Visit (HOSPITAL_COMMUNITY)
Admission: RE | Admit: 2022-05-23 | Discharge: 2022-05-23 | Disposition: A | Discharge status: Home / Self Care | Payer: Medicare Other | Source: Ambulatory Visit | Attending: Internal Medicine | Admitting: Internal Medicine

## 2022-05-23 DIAGNOSIS — R296 Repeated falls: Secondary | ICD-10-CM | POA: Diagnosis not present

## 2022-05-23 DIAGNOSIS — I2510 Atherosclerotic heart disease of native coronary artery without angina pectoris: Secondary | ICD-10-CM | POA: Diagnosis not present

## 2022-05-23 DIAGNOSIS — N1831 Chronic kidney disease, stage 3a: Secondary | ICD-10-CM | POA: Insufficient documentation

## 2022-05-23 DIAGNOSIS — Z951 Presence of aortocoronary bypass graft: Secondary | ICD-10-CM | POA: Insufficient documentation

## 2022-05-23 DIAGNOSIS — E785 Hyperlipidemia, unspecified: Secondary | ICD-10-CM | POA: Insufficient documentation

## 2022-05-23 DIAGNOSIS — M6281 Muscle weakness (generalized): Secondary | ICD-10-CM | POA: Diagnosis not present

## 2022-05-23 DIAGNOSIS — E1151 Type 2 diabetes mellitus with diabetic peripheral angiopathy without gangrene: Secondary | ICD-10-CM | POA: Diagnosis not present

## 2022-05-23 DIAGNOSIS — E1122 Type 2 diabetes mellitus with diabetic chronic kidney disease: Secondary | ICD-10-CM | POA: Insufficient documentation

## 2022-05-23 DIAGNOSIS — E1136 Type 2 diabetes mellitus with diabetic cataract: Secondary | ICD-10-CM | POA: Diagnosis not present

## 2022-05-23 DIAGNOSIS — I129 Hypertensive chronic kidney disease with stage 1 through stage 4 chronic kidney disease, or unspecified chronic kidney disease: Secondary | ICD-10-CM | POA: Insufficient documentation

## 2022-05-23 DIAGNOSIS — F0390 Unspecified dementia without behavioral disturbance: Secondary | ICD-10-CM | POA: Insufficient documentation

## 2022-05-23 DIAGNOSIS — R1310 Dysphagia, unspecified: Secondary | ICD-10-CM | POA: Insufficient documentation

## 2022-05-23 DIAGNOSIS — K219 Gastro-esophageal reflux disease without esophagitis: Secondary | ICD-10-CM | POA: Diagnosis not present

## 2022-05-23 DIAGNOSIS — R059 Cough, unspecified: Secondary | ICD-10-CM

## 2022-05-23 DIAGNOSIS — R2689 Other abnormalities of gait and mobility: Secondary | ICD-10-CM | POA: Diagnosis not present

## 2022-05-23 NOTE — Progress Notes (Signed)
Objective Swallowing Evaluation: Type of Study: MBS-Modified Barium Swallow Study   Patient Details  Name: [name] MRN: [id number] Date of Birth: 1935-01-06  Today's Date: 05/23/2022 Time: SLP Start Time (ACUTE ONLY): 4142 -SLP Stop Time (ACUTE ONLY): 1210  SLP Time Calculation (min) (ACUTE ONLY): 25 min   Past Medical History:  Past Medical History:  Diagnosis Date   Arthritis    CAD (coronary artery disease)    Cataract    Diabetes mellitus    GERD (gastroesophageal reflux disease)    Helicobacter pylori gastritis 01/2006, 03/2011   EGD - Pylera Tx 2010   Hiccups    History of kidney stones    Hyperlipidemia    Hypertension    PAD (peripheral artery disease) ([place])    Past Surgical History:  Past Surgical History:  Procedure Laterality Date   CATARACT EXTRACTION     right   COLONOSCOPY  2002, 2007, 06/09/2011   Dr. Evelina Bucy - [name], : for FHx colon cancer, no adenomas; 2012: [facility] - normal   CORONARY ARTERY BYPASS GRAFT  05-07-2008   ENDARTERECTOMY FEMORAL Bilateral 03/23/2015   Procedure: ENDARTERECTOMY OF BILATERAL COMMON FEMORAL AND PROFUNDA ARTERIES;  Surgeon: Rosetta Posner, MD;  Location: [facility];  Service: Vascular;  Laterality: Bilateral;   ESOPHAGOGASTRODUODENOSCOPY  02/20/06   Dr. Evelina Bucy, [name], [place]. pylori gastritis and GERD changes (pathology)   EYE SURGERY Left    cataract   HERNIA REPAIR  12/24/2010   INSERTION OF ILIAC STENT Bilateral 03/23/2015   Procedure: LEFT EXTERNAL ILIAC AND BILATERAL COMMON ILIAC STENT;  Surgeon: Rosetta Posner, MD;  [facility];  Service: Vascular;  Laterality: Bilateral;   JOINT REPLACEMENT     right knee replacement   PATCH ANGIOPLASTY Bilateral 03/23/2015   Procedure: PATCH ANGIOPLASTY OF BILATERAL COMMON FEMORAL ARTERIES;  Surgeon: Rosetta Posner, MD;  Location: [facility];  Service: Vascular;  Laterality: Bilateral;   PERIPHERAL VASCULAR CATHETERIZATION N/A 03/11/2015   Procedure: Abdominal Aortogram w/Lower Extremity;   Surgeon: Rosetta Posner, MD;  Location: [facility] CV LAB;  Service: Cardiovascular;  Laterality: N/A;   TOTAL KNEE ARTHROPLASTY  07-13-2012   right   UPPER GASTROINTESTINAL ENDOSCOPY  03/09/2009   Dr. Carlean Purl: H. pylori gastritis (Pylera Tx) and 2 cm hiatal hernia   HPI: 87 year old male referred for OP MBS due to increased frequency of coughing associated with PO intake. PMHx of dementia, peripheral vascular disease, hypertension, gastroesophageal reflux disease, non-insulin-dependent diabetes mellitus type 2, chronic kidney disease stage IIIa, hyperlipidemia, chronic hyponatremia, coronary artery disease status post CABG.  Pt underwent clinical swallow evaluation last Sept 2022 when admitted to [facility].  A regular diet/thin liquids was recommended.  He has some history of oral holding with prompting needed to chew and swallow.   Subjective: alert; wife present to provide history    Recommendations for follow up therapy are one component of a multi-disciplinary discharge planning process, led by the attending physician.  Recommendations may be updated based on patient status, additional functional criteria and insurance authorization.  Assessment / Plan / Recommendation     05/23/2022    1:00 PM  Clinical Impressions  Clinical Impression Pt presents with a dementia-based dysphagia that leads to oral holding due to inattention. He requires verbal prompts to continue to chew and swallow ([name] reports that this is consistent with baseline behavior). Pharyngeally, his swallow was quite functional with only occasional trace penetration of thin liquids (considered WFL), no aspiration, and adequate clearance of liquids  and solids through the pharynx.  A brief scan of the esophagus at the end of the exam revealed barium stasis.  He did not swallow the barium pill whole, instead chewing it and swallowing it with puree.  Notable was a coughing episode near the end of the study that did NOT correlated with  airway intrusion. Recommend continuing current regular diet with thin liquids; continue to give meds crushed in puree.  Discussed with [name] the results/recs; the nature of dementia and its impact on swallowing; the still reliable airway protection that was observed on today's study.  SLP Visit Diagnosis Dysphagia, oral phase (R13.11)  Impact on safety and function No limitations         05/23/2022    1:00 PM  Treatment Recommendations  Treatment Recommendations No treatment recommended at this time        07/11/2021    1:00 PM  Prognosis  Barriers to Reach Goals Cognitive deficits       05/23/2022    1:00 PM  Diet Recommendations  SLP Diet Recommendations Regular solids;Thin liquid  Liquid Administration via Cup;Straw  Medication Administration Crushed with puree  Compensations Minimize environmental distractions         05/23/2022    1:00 PM  Other Recommendations  Oral Care Recommendations Oral care BID  Follow Up Recommendations No SLP follow up       07/11/2021    1:00 PM  Frequency and Duration   Speech Therapy Frequency (ACUTE ONLY) min 2x/week  Treatment Duration 2 weeks         05/23/2022    1:00 PM  Oral Phase  Oral Phase Impaired  Oral - Thin Straw WFL  Oral - Puree Holding of bolus;Delayed oral transit  Oral - Regular Holding of bolus;Delayed oral transit       05/23/2022    1:00 PM  Pharyngeal Phase  Pharyngeal Phase WFL         No data to display         [name] L. Stephen Ringer, MA CCC/SLP Clinical Specialist - Acute Care SLP Acute Rehabilitation Services Office number [phone]   [name] 05/23/2022, 1:39 PM

## 2022-05-24 DIAGNOSIS — M6281 Muscle weakness (generalized): Secondary | ICD-10-CM | POA: Diagnosis not present

## 2022-05-24 DIAGNOSIS — R278 Other lack of coordination: Secondary | ICD-10-CM | POA: Diagnosis not present

## 2022-05-25 DIAGNOSIS — M6281 Muscle weakness (generalized): Secondary | ICD-10-CM | POA: Diagnosis not present

## 2022-05-25 DIAGNOSIS — R296 Repeated falls: Secondary | ICD-10-CM | POA: Diagnosis not present

## 2022-05-25 DIAGNOSIS — R2689 Other abnormalities of gait and mobility: Secondary | ICD-10-CM | POA: Diagnosis not present

## 2022-05-26 DIAGNOSIS — R278 Other lack of coordination: Secondary | ICD-10-CM | POA: Diagnosis not present

## 2022-05-26 DIAGNOSIS — M6281 Muscle weakness (generalized): Secondary | ICD-10-CM | POA: Diagnosis not present

## 2022-05-30 DIAGNOSIS — R2689 Other abnormalities of gait and mobility: Secondary | ICD-10-CM | POA: Diagnosis not present

## 2022-05-30 DIAGNOSIS — R296 Repeated falls: Secondary | ICD-10-CM | POA: Diagnosis not present

## 2022-05-30 DIAGNOSIS — M6281 Muscle weakness (generalized): Secondary | ICD-10-CM | POA: Diagnosis not present

## 2022-05-31 DIAGNOSIS — M6281 Muscle weakness (generalized): Secondary | ICD-10-CM | POA: Diagnosis not present

## 2022-05-31 DIAGNOSIS — R278 Other lack of coordination: Secondary | ICD-10-CM | POA: Diagnosis not present

## 2022-06-01 DIAGNOSIS — R2689 Other abnormalities of gait and mobility: Secondary | ICD-10-CM | POA: Diagnosis not present

## 2022-06-01 DIAGNOSIS — M6281 Muscle weakness (generalized): Secondary | ICD-10-CM | POA: Diagnosis not present

## 2022-06-01 DIAGNOSIS — R296 Repeated falls: Secondary | ICD-10-CM | POA: Diagnosis not present

## 2022-06-02 DIAGNOSIS — R278 Other lack of coordination: Secondary | ICD-10-CM | POA: Diagnosis not present

## 2022-06-02 DIAGNOSIS — M6281 Muscle weakness (generalized): Secondary | ICD-10-CM | POA: Diagnosis not present

## 2022-06-06 DIAGNOSIS — M6281 Muscle weakness (generalized): Secondary | ICD-10-CM | POA: Diagnosis not present

## 2022-06-06 DIAGNOSIS — R296 Repeated falls: Secondary | ICD-10-CM | POA: Diagnosis not present

## 2022-06-06 DIAGNOSIS — R2689 Other abnormalities of gait and mobility: Secondary | ICD-10-CM | POA: Diagnosis not present

## 2022-06-07 DIAGNOSIS — R278 Other lack of coordination: Secondary | ICD-10-CM | POA: Diagnosis not present

## 2022-06-07 DIAGNOSIS — M6281 Muscle weakness (generalized): Secondary | ICD-10-CM | POA: Diagnosis not present

## 2022-06-08 DIAGNOSIS — R296 Repeated falls: Secondary | ICD-10-CM | POA: Diagnosis not present

## 2022-06-08 DIAGNOSIS — M6281 Muscle weakness (generalized): Secondary | ICD-10-CM | POA: Diagnosis not present

## 2022-06-08 DIAGNOSIS — R2689 Other abnormalities of gait and mobility: Secondary | ICD-10-CM | POA: Diagnosis not present

## 2022-06-09 DIAGNOSIS — M6281 Muscle weakness (generalized): Secondary | ICD-10-CM | POA: Diagnosis not present

## 2022-06-09 DIAGNOSIS — R278 Other lack of coordination: Secondary | ICD-10-CM | POA: Diagnosis not present

## 2022-06-13 DIAGNOSIS — R296 Repeated falls: Secondary | ICD-10-CM | POA: Diagnosis not present

## 2022-06-13 DIAGNOSIS — R2689 Other abnormalities of gait and mobility: Secondary | ICD-10-CM | POA: Diagnosis not present

## 2022-06-13 DIAGNOSIS — M6281 Muscle weakness (generalized): Secondary | ICD-10-CM | POA: Diagnosis not present

## 2022-06-14 DIAGNOSIS — R278 Other lack of coordination: Secondary | ICD-10-CM | POA: Diagnosis not present

## 2022-06-14 DIAGNOSIS — M6281 Muscle weakness (generalized): Secondary | ICD-10-CM | POA: Diagnosis not present

## 2022-06-15 DIAGNOSIS — R2689 Other abnormalities of gait and mobility: Secondary | ICD-10-CM | POA: Diagnosis not present

## 2022-06-15 DIAGNOSIS — R296 Repeated falls: Secondary | ICD-10-CM | POA: Diagnosis not present

## 2022-06-15 DIAGNOSIS — M6281 Muscle weakness (generalized): Secondary | ICD-10-CM | POA: Diagnosis not present

## 2022-06-16 DIAGNOSIS — R278 Other lack of coordination: Secondary | ICD-10-CM | POA: Diagnosis not present

## 2022-06-16 DIAGNOSIS — M6281 Muscle weakness (generalized): Secondary | ICD-10-CM | POA: Diagnosis not present

## 2022-06-20 DIAGNOSIS — R296 Repeated falls: Secondary | ICD-10-CM | POA: Diagnosis not present

## 2022-06-20 DIAGNOSIS — R2689 Other abnormalities of gait and mobility: Secondary | ICD-10-CM | POA: Diagnosis not present

## 2022-06-20 DIAGNOSIS — M6281 Muscle weakness (generalized): Secondary | ICD-10-CM | POA: Diagnosis not present

## 2022-06-21 DIAGNOSIS — M6281 Muscle weakness (generalized): Secondary | ICD-10-CM | POA: Diagnosis not present

## 2022-06-21 DIAGNOSIS — R278 Other lack of coordination: Secondary | ICD-10-CM | POA: Diagnosis not present

## 2022-06-22 DIAGNOSIS — M6281 Muscle weakness (generalized): Secondary | ICD-10-CM | POA: Diagnosis not present

## 2022-06-22 DIAGNOSIS — R2689 Other abnormalities of gait and mobility: Secondary | ICD-10-CM | POA: Diagnosis not present

## 2022-06-22 DIAGNOSIS — R296 Repeated falls: Secondary | ICD-10-CM | POA: Diagnosis not present

## 2022-06-23 DIAGNOSIS — M6281 Muscle weakness (generalized): Secondary | ICD-10-CM | POA: Diagnosis not present

## 2022-06-23 DIAGNOSIS — R278 Other lack of coordination: Secondary | ICD-10-CM | POA: Diagnosis not present

## 2022-06-27 DIAGNOSIS — R296 Repeated falls: Secondary | ICD-10-CM | POA: Diagnosis not present

## 2022-06-27 DIAGNOSIS — M6281 Muscle weakness (generalized): Secondary | ICD-10-CM | POA: Diagnosis not present

## 2022-06-27 DIAGNOSIS — R2689 Other abnormalities of gait and mobility: Secondary | ICD-10-CM | POA: Diagnosis not present

## 2022-06-29 DIAGNOSIS — R2689 Other abnormalities of gait and mobility: Secondary | ICD-10-CM | POA: Diagnosis not present

## 2022-06-29 DIAGNOSIS — M6281 Muscle weakness (generalized): Secondary | ICD-10-CM | POA: Diagnosis not present

## 2022-06-29 DIAGNOSIS — R296 Repeated falls: Secondary | ICD-10-CM | POA: Diagnosis not present

## 2022-07-04 DIAGNOSIS — M6281 Muscle weakness (generalized): Secondary | ICD-10-CM | POA: Diagnosis not present

## 2022-07-04 DIAGNOSIS — R296 Repeated falls: Secondary | ICD-10-CM | POA: Diagnosis not present

## 2022-07-04 DIAGNOSIS — R2689 Other abnormalities of gait and mobility: Secondary | ICD-10-CM | POA: Diagnosis not present

## 2022-07-06 DIAGNOSIS — R296 Repeated falls: Secondary | ICD-10-CM | POA: Diagnosis not present

## 2022-07-06 DIAGNOSIS — R2689 Other abnormalities of gait and mobility: Secondary | ICD-10-CM | POA: Diagnosis not present

## 2022-07-06 DIAGNOSIS — M6281 Muscle weakness (generalized): Secondary | ICD-10-CM | POA: Diagnosis not present

## 2022-07-11 DIAGNOSIS — R2689 Other abnormalities of gait and mobility: Secondary | ICD-10-CM | POA: Diagnosis not present

## 2022-07-11 DIAGNOSIS — M6281 Muscle weakness (generalized): Secondary | ICD-10-CM | POA: Diagnosis not present

## 2022-07-11 DIAGNOSIS — R296 Repeated falls: Secondary | ICD-10-CM | POA: Diagnosis not present

## 2022-07-13 DIAGNOSIS — R2689 Other abnormalities of gait and mobility: Secondary | ICD-10-CM | POA: Diagnosis not present

## 2022-07-13 DIAGNOSIS — M6281 Muscle weakness (generalized): Secondary | ICD-10-CM | POA: Diagnosis not present

## 2022-07-13 DIAGNOSIS — R296 Repeated falls: Secondary | ICD-10-CM | POA: Diagnosis not present

## 2022-07-18 DIAGNOSIS — R296 Repeated falls: Secondary | ICD-10-CM | POA: Diagnosis not present

## 2022-07-18 DIAGNOSIS — R2689 Other abnormalities of gait and mobility: Secondary | ICD-10-CM | POA: Diagnosis not present

## 2022-07-18 DIAGNOSIS — M6281 Muscle weakness (generalized): Secondary | ICD-10-CM | POA: Diagnosis not present

## 2022-07-19 ENCOUNTER — Inpatient Hospital Stay (HOSPITAL_COMMUNITY)
Admission: EM | Admit: 2022-07-19 | Discharge: 2022-07-30 | DRG: 034 | Disposition: A | Discharge status: Home Health | Payer: Medicare Other | Attending: Family Medicine | Admitting: Family Medicine

## 2022-07-19 ENCOUNTER — Encounter (HOSPITAL_COMMUNITY): Payer: Self-pay | Admitting: Family Medicine

## 2022-07-19 ENCOUNTER — Other Ambulatory Visit: Payer: Self-pay

## 2022-07-19 ENCOUNTER — Emergency Department (HOSPITAL_COMMUNITY): Payer: Medicare Other

## 2022-07-19 ENCOUNTER — Observation Stay (HOSPITAL_COMMUNITY): Payer: Medicare Other

## 2022-07-19 DIAGNOSIS — Z20822 Contact with and (suspected) exposure to covid-19: Secondary | ICD-10-CM | POA: Diagnosis present

## 2022-07-19 DIAGNOSIS — D62 Acute posthemorrhagic anemia: Secondary | ICD-10-CM | POA: Diagnosis not present

## 2022-07-19 DIAGNOSIS — Z8249 Family history of ischemic heart disease and other diseases of the circulatory system: Secondary | ICD-10-CM

## 2022-07-19 DIAGNOSIS — Z79899 Other long term (current) drug therapy: Secondary | ICD-10-CM | POA: Diagnosis not present

## 2022-07-19 DIAGNOSIS — K219 Gastro-esophageal reflux disease without esophagitis: Secondary | ICD-10-CM | POA: Diagnosis present

## 2022-07-19 DIAGNOSIS — Z888 Allergy status to other drugs, medicaments and biological substances status: Secondary | ICD-10-CM | POA: Diagnosis not present

## 2022-07-19 DIAGNOSIS — N189 Chronic kidney disease, unspecified: Secondary | ICD-10-CM | POA: Diagnosis not present

## 2022-07-19 DIAGNOSIS — Z96651 Presence of right artificial knee joint: Secondary | ICD-10-CM | POA: Diagnosis present

## 2022-07-19 DIAGNOSIS — Z87891 Personal history of nicotine dependence: Secondary | ICD-10-CM | POA: Diagnosis not present

## 2022-07-19 DIAGNOSIS — R4701 Aphasia: Secondary | ICD-10-CM | POA: Diagnosis present

## 2022-07-19 DIAGNOSIS — F0390 Unspecified dementia without behavioral disturbance: Secondary | ICD-10-CM | POA: Diagnosis present

## 2022-07-19 DIAGNOSIS — Z7902 Long term (current) use of antithrombotics/antiplatelets: Secondary | ICD-10-CM

## 2022-07-19 DIAGNOSIS — I639 Cerebral infarction, unspecified: Secondary | ICD-10-CM | POA: Diagnosis not present

## 2022-07-19 DIAGNOSIS — R531 Weakness: Secondary | ICD-10-CM | POA: Diagnosis not present

## 2022-07-19 DIAGNOSIS — J189 Pneumonia, unspecified organism: Principal | ICD-10-CM | POA: Diagnosis present

## 2022-07-19 DIAGNOSIS — R042 Hemoptysis: Secondary | ICD-10-CM | POA: Diagnosis not present

## 2022-07-19 DIAGNOSIS — R29702 NIHSS score 2: Secondary | ICD-10-CM | POA: Diagnosis present

## 2022-07-19 DIAGNOSIS — I471 Supraventricular tachycardia: Secondary | ICD-10-CM | POA: Diagnosis not present

## 2022-07-19 DIAGNOSIS — R1310 Dysphagia, unspecified: Secondary | ICD-10-CM | POA: Diagnosis present

## 2022-07-19 DIAGNOSIS — Z9841 Cataract extraction status, right eye: Secondary | ICD-10-CM

## 2022-07-19 DIAGNOSIS — I129 Hypertensive chronic kidney disease with stage 1 through stage 4 chronic kidney disease, or unspecified chronic kidney disease: Secondary | ICD-10-CM | POA: Diagnosis not present

## 2022-07-19 DIAGNOSIS — Z7984 Long term (current) use of oral hypoglycemic drugs: Secondary | ICD-10-CM

## 2022-07-19 DIAGNOSIS — R579 Shock, unspecified: Secondary | ICD-10-CM | POA: Diagnosis not present

## 2022-07-19 DIAGNOSIS — T8119XA Other postprocedural shock, initial encounter: Secondary | ICD-10-CM | POA: Diagnosis not present

## 2022-07-19 DIAGNOSIS — G8191 Hemiplegia, unspecified affecting right dominant side: Secondary | ICD-10-CM | POA: Diagnosis present

## 2022-07-19 DIAGNOSIS — I2510 Atherosclerotic heart disease of native coronary artery without angina pectoris: Secondary | ICD-10-CM | POA: Diagnosis present

## 2022-07-19 DIAGNOSIS — G9341 Metabolic encephalopathy: Secondary | ICD-10-CM | POA: Diagnosis present

## 2022-07-19 DIAGNOSIS — M549 Dorsalgia, unspecified: Secondary | ICD-10-CM | POA: Diagnosis not present

## 2022-07-19 DIAGNOSIS — Y838 Other surgical procedures as the cause of abnormal reaction of the patient, or of later complication, without mention of misadventure at the time of the procedure: Secondary | ICD-10-CM | POA: Diagnosis not present

## 2022-07-19 DIAGNOSIS — Z23 Encounter for immunization: Secondary | ICD-10-CM | POA: Diagnosis not present

## 2022-07-19 DIAGNOSIS — I63232 Cerebral infarction due to unspecified occlusion or stenosis of left carotid arteries: Secondary | ICD-10-CM | POA: Diagnosis not present

## 2022-07-19 DIAGNOSIS — E1122 Type 2 diabetes mellitus with diabetic chronic kidney disease: Secondary | ICD-10-CM | POA: Diagnosis not present

## 2022-07-19 DIAGNOSIS — I10 Essential (primary) hypertension: Secondary | ICD-10-CM | POA: Diagnosis present

## 2022-07-19 DIAGNOSIS — E1151 Type 2 diabetes mellitus with diabetic peripheral angiopathy without gangrene: Secondary | ICD-10-CM | POA: Diagnosis present

## 2022-07-19 DIAGNOSIS — E1136 Type 2 diabetes mellitus with diabetic cataract: Secondary | ICD-10-CM | POA: Diagnosis present

## 2022-07-19 DIAGNOSIS — R278 Other lack of coordination: Secondary | ICD-10-CM | POA: Diagnosis present

## 2022-07-19 DIAGNOSIS — Z83438 Family history of other disorder of lipoprotein metabolism and other lipidemia: Secondary | ICD-10-CM

## 2022-07-19 DIAGNOSIS — R29810 Facial weakness: Secondary | ICD-10-CM | POA: Diagnosis present

## 2022-07-19 DIAGNOSIS — Z8673 Personal history of transient ischemic attack (TIA), and cerebral infarction without residual deficits: Secondary | ICD-10-CM

## 2022-07-19 DIAGNOSIS — G9340 Encephalopathy, unspecified: Secondary | ICD-10-CM

## 2022-07-19 DIAGNOSIS — R4182 Altered mental status, unspecified: Secondary | ICD-10-CM | POA: Diagnosis not present

## 2022-07-19 DIAGNOSIS — I63512 Cerebral infarction due to unspecified occlusion or stenosis of left middle cerebral artery: Secondary | ICD-10-CM | POA: Diagnosis not present

## 2022-07-19 DIAGNOSIS — N1831 Chronic kidney disease, stage 3a: Secondary | ICD-10-CM | POA: Diagnosis present

## 2022-07-19 DIAGNOSIS — Z951 Presence of aortocoronary bypass graft: Secondary | ICD-10-CM | POA: Diagnosis not present

## 2022-07-19 DIAGNOSIS — I6522 Occlusion and stenosis of left carotid artery: Secondary | ICD-10-CM | POA: Diagnosis not present

## 2022-07-19 DIAGNOSIS — Z833 Family history of diabetes mellitus: Secondary | ICD-10-CM

## 2022-07-19 DIAGNOSIS — Z882 Allergy status to sulfonamides status: Secondary | ICD-10-CM

## 2022-07-19 DIAGNOSIS — R918 Other nonspecific abnormal finding of lung field: Secondary | ICD-10-CM | POA: Diagnosis not present

## 2022-07-19 DIAGNOSIS — Z806 Family history of leukemia: Secondary | ICD-10-CM

## 2022-07-19 DIAGNOSIS — I4891 Unspecified atrial fibrillation: Secondary | ICD-10-CM | POA: Diagnosis present

## 2022-07-19 DIAGNOSIS — G319 Degenerative disease of nervous system, unspecified: Secondary | ICD-10-CM | POA: Diagnosis not present

## 2022-07-19 DIAGNOSIS — E785 Hyperlipidemia, unspecified: Secondary | ICD-10-CM | POA: Diagnosis present

## 2022-07-19 DIAGNOSIS — I083 Combined rheumatic disorders of mitral, aortic and tricuspid valves: Secondary | ICD-10-CM | POA: Diagnosis present

## 2022-07-19 DIAGNOSIS — I6523 Occlusion and stenosis of bilateral carotid arteries: Secondary | ICD-10-CM | POA: Diagnosis not present

## 2022-07-19 DIAGNOSIS — Z87442 Personal history of urinary calculi: Secondary | ICD-10-CM

## 2022-07-19 DIAGNOSIS — R066 Hiccough: Secondary | ICD-10-CM | POA: Diagnosis present

## 2022-07-19 DIAGNOSIS — I6389 Other cerebral infarction: Secondary | ICD-10-CM | POA: Diagnosis not present

## 2022-07-19 LAB — CBC WITH DIFFERENTIAL/PLATELET
Abs Immature Granulocytes: 0.03 10*3/uL (ref 0.00–0.07)
Basophils Absolute: 0 10*3/uL (ref 0.0–0.1)
Basophils Relative: 1 %
Eosinophils Absolute: 0.2 10*3/uL (ref 0.0–0.5)
Eosinophils Relative: 2 %
HCT: 39.7 % (ref 39.0–52.0)
Hemoglobin: 13.2 g/dL (ref 13.0–17.0)
Immature Granulocytes: 0 %
Lymphocytes Relative: 27 %
Lymphs Abs: 2.3 10*3/uL (ref 0.7–4.0)
MCH: 30.1 pg (ref 26.0–34.0)
MCHC: 33.2 g/dL (ref 30.0–36.0)
MCV: 90.4 fL (ref 80.0–100.0)
Monocytes Absolute: 0.6 10*3/uL (ref 0.1–1.0)
Monocytes Relative: 7 %
Neutro Abs: 5.3 10*3/uL (ref 1.7–7.7)
Neutrophils Relative %: 63 %
Platelets: 208 10*3/uL (ref 150–400)
RBC: 4.39 MIL/uL (ref 4.22–5.81)
RDW: 13.2 % (ref 11.5–15.5)
WBC: 8.4 10*3/uL (ref 4.0–10.5)
nRBC: 0 % (ref 0.0–0.2)

## 2022-07-19 LAB — RESPIRATORY PANEL BY PCR

## 2022-07-19 LAB — AMMONIA: Ammonia: 37 umol/L — ABNORMAL HIGH (ref 9–35)

## 2022-07-19 LAB — LACTIC ACID, PLASMA: Lactic Acid, Venous: 1.8 mmol/L (ref 0.5–1.9)

## 2022-07-19 LAB — PROCALCITONIN: Procalcitonin: <0.1 ng/mL

## 2022-07-19 LAB — ETHANOL: Alcohol, Ethyl (B): <10 mg/dL (ref <10)

## 2022-07-19 LAB — COMPREHENSIVE METABOLIC PANEL
ALT: 10 U/L (ref 0–44)
AST: 25 U/L (ref 15–41)
Albumin: 3.6 g/dL (ref 3.5–5.0)
Alkaline Phosphatase: 74 U/L (ref 38–126)
Anion gap: 9 (ref 5–15)
BUN: 33 mg/dL — ABNORMAL HIGH (ref 8–23)
CO2: 24 mmol/L (ref 22–32)
Calcium: 9.3 mg/dL (ref 8.9–10.3)
Chloride: 101 mmol/L (ref 98–111)
Creatinine, Ser: 1.29 mg/dL — ABNORMAL HIGH (ref 0.61–1.24)
GFR, Estimated: 54 mL/min — ABNORMAL LOW (ref >60)
Glucose, Bld: 186 mg/dL — ABNORMAL HIGH (ref 70–99)
Potassium: 4.9 mmol/L (ref 3.5–5.1)
Sodium: 134 mmol/L — ABNORMAL LOW (ref 135–145)
Total Bilirubin: 0.8 mg/dL (ref 0.3–1.2)
Total Protein: 6.9 g/dL (ref 6.5–8.1)

## 2022-07-19 LAB — RAPID URINE DRUG SCREEN, HOSP PERFORMED
Amphetamines: NOT DETECTED
Barbiturates: NOT DETECTED
Benzodiazepines: NOT DETECTED
Cocaine: NOT DETECTED
Opiates: NOT DETECTED
Tetrahydrocannabinol: NOT DETECTED

## 2022-07-19 LAB — I-STAT VENOUS BLOOD GAS, ED
Acid-base deficit: 1 mmol/L (ref 0.0–2.0)
Bicarbonate: 25.9 mmol/L (ref 20.0–28.0)
Calcium, Ion: 1.14 mmol/L — ABNORMAL LOW (ref 1.15–1.40)
HCT: 39 % (ref 39.0–52.0)
Hemoglobin: 13.3 g/dL (ref 13.0–17.0)
O2 Saturation: 96 %
Potassium: 5.9 mmol/L — ABNORMAL HIGH (ref 3.5–5.1)
Sodium: 133 mmol/L — ABNORMAL LOW (ref 135–145)
TCO2: 27 mmol/L (ref 22–32)
pCO2, Ven: 48.4 mmHg (ref 44–60)
pH, Ven: 7.335 (ref 7.25–7.43)
pO2, Ven: 85 mmHg — ABNORMAL HIGH (ref 32–45)

## 2022-07-19 LAB — URINALYSIS, ROUTINE W REFLEX MICROSCOPIC
Bilirubin Urine: NEGATIVE
Glucose, UA: NEGATIVE mg/dL
Hgb urine dipstick: NEGATIVE
Ketones, ur: NEGATIVE mg/dL
Leukocytes,Ua: NEGATIVE
Nitrite: NEGATIVE
Protein, ur: NEGATIVE mg/dL
Specific Gravity, Urine: 1.017 (ref 1.005–1.030)
pH: 6 (ref 5.0–8.0)

## 2022-07-19 LAB — CBG MONITORING, ED: Glucose-Capillary: 82 mg/dL (ref 70–99)

## 2022-07-19 LAB — VITAMIN B12: Vitamin B-12: 288 pg/mL (ref 180–914)

## 2022-07-19 LAB — RESP PANEL BY RT-PCR (FLU A&B, COVID) ARPGX2
Influenza A by PCR: NEGATIVE
Influenza B by PCR: NEGATIVE
SARS Coronavirus 2 by RT PCR: NEGATIVE

## 2022-07-19 LAB — HEMOGLOBIN A1C
Hgb A1c MFr Bld: 6.5 % — ABNORMAL HIGH (ref 4.8–5.6)
Mean Plasma Glucose: 139.85 mg/dL

## 2022-07-19 LAB — TSH: TSH: 1.473 u[IU]/mL (ref 0.350–4.500)

## 2022-07-19 MED ORDER — SODIUM CHLORIDE 0.9 % IV SOLN
2.0000 g | INTRAVENOUS | Status: AC
  Administered 2022-07-20 – 2022-07-24 (×5): 2 g via INTRAVENOUS
  Filled 2022-07-19 (×5): qty 20

## 2022-07-19 MED ORDER — ACETAMINOPHEN 650 MG RE SUPP: 650.0000 mg | Freq: Four times a day (QID) | RECTAL | Status: DC | PRN

## 2022-07-19 MED ORDER — SODIUM CHLORIDE 0.9 % IV BOLUS
1000.0000 mL | Freq: Once | INTRAVENOUS | Status: AC
  Administered 2022-07-19: 1000 mL via INTRAVENOUS

## 2022-07-19 MED ORDER — CLOPIDOGREL BISULFATE 75 MG PO TABS
75.0000 mg | ORAL_TABLET | Freq: Every day | ORAL | Status: DC
  Administered 2022-07-20 – 2022-07-30 (×11): 75 mg via ORAL
  Filled 2022-07-19 (×11): qty 1

## 2022-07-19 MED ORDER — ENOXAPARIN SODIUM 40 MG/0.4ML IJ SOSY
40.0000 mg | PREFILLED_SYRINGE | INTRAMUSCULAR | Status: DC
  Administered 2022-07-19 – 2022-07-29 (×10): 40 mg via SUBCUTANEOUS
  Filled 2022-07-19 (×10): qty 0.4

## 2022-07-19 MED ORDER — PANTOPRAZOLE SODIUM 40 MG PO TBEC
40.0000 mg | DELAYED_RELEASE_TABLET | Freq: Every day | ORAL | Status: DC
  Administered 2022-07-20 – 2022-07-29 (×9): 40 mg via ORAL
  Filled 2022-07-19 (×9): qty 1

## 2022-07-19 MED ORDER — SODIUM CHLORIDE 0.9 % IV SOLN
2.0000 g | Freq: Once | INTRAVENOUS | Status: AC
  Administered 2022-07-19: 2 g via INTRAVENOUS
  Filled 2022-07-19: qty 20

## 2022-07-19 MED ORDER — SODIUM CHLORIDE 0.9 % IV SOLN
500.0000 mg | INTRAVENOUS | Status: AC
  Administered 2022-07-20 – 2022-07-24 (×5): 500 mg via INTRAVENOUS
  Filled 2022-07-19 (×5): qty 5

## 2022-07-19 MED ORDER — INSULIN ASPART 100 UNIT/ML IJ SOLN
0.0000 [IU] | Freq: Three times a day (TID) | INTRAMUSCULAR | Status: DC
  Administered 2022-07-21 (×2): 1 [IU] via SUBCUTANEOUS
  Administered 2022-07-22: 2 [IU] via SUBCUTANEOUS
  Administered 2022-07-23 (×2): 1 [IU] via SUBCUTANEOUS
  Administered 2022-07-24: 2 [IU] via SUBCUTANEOUS
  Administered 2022-07-24 – 2022-07-26 (×3): 1 [IU] via SUBCUTANEOUS
  Administered 2022-07-26: 2 [IU] via SUBCUTANEOUS
  Administered 2022-07-27 – 2022-07-28 (×3): 1 [IU] via SUBCUTANEOUS
  Administered 2022-07-29: 3 [IU] via SUBCUTANEOUS
  Administered 2022-07-29 (×2): 1 [IU] via SUBCUTANEOUS
  Administered 2022-07-30: 2 [IU] via SUBCUTANEOUS

## 2022-07-19 MED ORDER — ACETAMINOPHEN 325 MG PO TABS
650.0000 mg | ORAL_TABLET | Freq: Four times a day (QID) | ORAL | Status: DC | PRN
  Administered 2022-07-21: 650 mg via ORAL
  Filled 2022-07-19: qty 2

## 2022-07-19 MED ORDER — SODIUM CHLORIDE 0.9 % IV SOLN
500.0000 mg | Freq: Once | INTRAVENOUS | Status: AC
  Administered 2022-07-19: 500 mg via INTRAVENOUS
  Filled 2022-07-19: qty 5

## 2022-07-19 MED ORDER — SODIUM CHLORIDE 0.9 % IV SOLN: INTRAVENOUS | Status: AC

## 2022-07-19 NOTE — H&P (Signed)
History and Physical    Patient: [name] LZJ:[id number] DOB: Jan 11, 1935 DOA: 07/19/2022 DOS: the patient was seen and examined on 07/19/2022 PCP: Ginger Organ., MD  Patient coming from: Home - lives with his wife. Caregiver during day. Ambulates with walker   Chief Complaint: AMS  HPI: [name] is a 87 y.o. male with medical history significant of CAD, T2DM, GERD, HLD, HTN, PAD, hx of CVA who presented to ED with change in mental status two days ago.  For 2 days he has been lethargic and confused. His caregiver also thought he had a left facial droop and so did the hairdresser. She thinks this is better. He has chronic left sided weakness and this seems unchanged.   He has stopped talking almost completely. He typically will converse with his wife some, but this is abnormal for him. He typically walks with a walker, but 2 days ago was in wheel chair.   At baseline he does talk, but very little. He walks around the house with walker. He needs full assist x 2 for showering. He needs help with dressing. He can feed himself. No falls in the last year.   He has not had any fever/chills. No open sores or bed sores. He has been coughing for 2 days that is dry,but at times is clear phlegm. No odor to urine, unsure of color change. He has been eating and drinking well. No N/V/D.    He does not smoke or drink alcohol.   ER Course:  vitals: afebrile, bp: 135/61, HR; 69, RR: 20, oxygen: 98%RA Pertinent labs: BUN: 33, creatinine: 1.29,  CXR: mild bibasilar subsegmental atelectasis or infiltrates CT head: no acute finding  In ED: given 1L IVF and rocephin/azithromycin. BC obtained. [facility] asked to admit.     Review of Systems: unable to review all systems due to the inability of the patient to answer questions. Past Medical History:  Diagnosis Date   Arthritis    CAD (coronary artery disease)    Cataract    Diabetes mellitus    GERD (gastroesophageal reflux disease)    Helicobacter  pylori gastritis 01/2006, 03/2011   EGD - Pylera Tx 2010   Hiccups    History of kidney stones    Hyperlipidemia    Hypertension    PAD (peripheral artery disease) [facility])    Past Surgical History:  Procedure Laterality Date   CATARACT EXTRACTION     right   COLONOSCOPY  2002, 2007, 06/09/2011   Dr. Evelina Bucy - [name], [place]: for FHx colon cancer, no adenomas; 2012: [facility] - normal   CORONARY ARTERY BYPASS GRAFT  05-07-2008   ENDARTERECTOMY FEMORAL Bilateral 03/23/2015   Procedure: ENDARTERECTOMY OF BILATERAL COMMON FEMORAL AND PROFUNDA ARTERIES;  Surgeon: Rosetta Posner, MD;  Location: [place];  Service: Vascular;  Laterality: Bilateral;   ESOPHAGOGASTRODUODENOSCOPY  02/20/06   Dr. Evelina Bucy, [name], [place] pylori gastritis and GERD changes (pathology)   EYE SURGERY Left    cataract   HERNIA REPAIR  12/24/2010   INSERTION OF ILIAC STENT Bilateral 03/23/2015   Procedure: LEFT EXTERNAL ILIAC AND BILATERAL COMMON ILIAC STENT;  Surgeon: Rosetta Posner, MD;  Location[facility];  Service: Vascular;  Laterality: Bilateral;   JOINT REPLACEMENT     right knee replacement   PATCH ANGIOPLASTY Bilateral 03/23/2015   Procedure: PATCH ANGIOPLASTY OF BILATERAL COMMON FEMORAL ARTERIES;  Surgeon: Rosetta Posner, MD;  Location: [place];  Service: Vascular;  Laterality: Bilateral;   PERIPHERAL  VASCULAR CATHETERIZATION N/A 03/11/2015   Procedure: Abdominal Aortogram w/Lower Extremity;  Surgeon: Rosetta Posner, MD;  Location: [place] CV LAB;  Service: Cardiovascular;  Laterality: N/A;   TOTAL KNEE ARTHROPLASTY  07-13-2012   right   UPPER GASTROINTESTINAL ENDOSCOPY  03/09/2009   Dr. Carlean Purl: H. pylori gastritis (Pylera Tx) and 2 cm hiatal hernia   Social History:  reports that he has quit smoking. His smoking use included cigarettes. He has a 40.00 pack-year smoking history. He has never used smokeless tobacco. He reports current alcohol use. He reports that he does not use drugs.  Allergies  Allergen Reactions    Atorvastatin Other (See Comments)    Muscle pain   Rosuvastatin Other (See Comments)    Muscle pain   Simvastatin Other (See Comments)    Muscle pain   Sulfonamide Derivatives Hives    Family History  Problem Relation Age of Onset   Heart disease Father    Diabetes Father    Heart attack Father    Hypertension Father    Varicose Veins Father    Cancer Sister        colon   Diabetes Sister    Other Sister        varicose veins   Heart attack Sister    Heart disease Sister    Hyperlipidemia Sister    Hypertension Sister    Varicose Veins Sister    Cancer Brother        prostate   Diabetes Brother    Heart disease Brother    Hyperlipidemia Brother    Hypertension Brother    Other Brother        varicose veins   Heart attack Brother    Varicose Veins Brother    Cancer Sister        lukemia   Myasthenia gravis Sister    Colon cancer Sister    Leukemia Sister    Heart attack Brother    Crohn's disease Son     Prior to Admission medications   Medication Sig Start Date End Date Taking? Authorizing Provider  ALEVE 220 MG tablet Take 220-440 mg by mouth 2 (two) times daily as needed (for pain).    [provider]  amoxicillin (AMOXIL) 500 MG capsule Take 2,000 mg by mouth See admin instructions. Take 2,000 mg by mouth 30 minutes prior to dental appointments Patient not taking: Reported on 09/20/2021 07/24/18   [provider]  clopidogrel (PLAVIX) 75 MG tablet Take 75 mg by mouth daily with supper. Patient not taking: Reported on 09/20/2021    [provider]  doxycycline (VIBRA-TABS) 100 MG tablet Take 1 tablet (100 mg total) by mouth every 12 (twelve) hours. Patient not taking: Reported on 09/20/2021 07/13/21   Geradine Girt, DO  glucose blood [facility] VERIO) test strip Check sugars daily Dx: E11.49 09/11/18   [provider]  magnesium oxide (MAG-OX) 400 (240 Mg) MG tablet Take 1 tablet (400 mg total) by mouth daily. 07/14/21   Geradine Girt, DO  metFORMIN (GLUCOPHAGE) 1000 MG tablet Take 1 tablet (1,000 mg total) by mouth daily with supper. 07/13/21   Geradine Girt, DO  metoprolol succinate (TOPROL-XL) 25 MG 24 hr tablet Take 25 mg by mouth daily with supper.    [provider]  Misc. Devices [facility]) MISC Dx: Muscle Weakness 11/27/19   [provider]  nystatin (MYCOSTATIN/NYSTOP) powder SMARTSIG:1 Topical Daily PRN 07/29/21   [provider]  Jonetta Speak  Lancets 30G MISC Pt to check BS daily  Dx: E11.49 02/24/20   [provider]  pantoprazole (PROTONIX) 40 MG tablet Take 40 mg by mouth daily before supper. 04/14/20   [provider]  polyethylene glycol (MIRALAX / GLYCOLAX) 17 g packet Take 17 g by mouth daily as needed for mild constipation. 07/13/21   Geradine Girt, DO  ramipril (ALTACE) 5 MG capsule Take 5 mg by mouth daily. 09/10/21   [provider]    Physical Exam: Vitals:   07/19/22 1500 07/19/22 1530 07/19/22 1630 07/19/22 1753  BP: (!) 116/54 101/87 101/61   Pulse: 61 73 (!) 53   Resp: 20 (!) 22 12   Temp:    98.1 F (36.7 C)  TempSrc:    Rectal  SpO2: 98% 100% 100%   Weight:    79.4 kg  Height:    '6\' 2"'$  (1.88 m)   General:  Appears calm and comfortable and is in NAD Eyes:  PERRL, EOMI, normal lids, iris ENT:  grossly normal hearing, lips & tongue, mmm; appropriate dentition Neck:  no LAD, masses or thyromegaly; no carotid bruits Cardiovascular:  RRR (very faint heart sounds), no m/r/g. No LE edema.  Respiratory:   CTA bilaterally with no wheezes/rales/rhonchi.  Normal respiratory effort. Abdomen:  soft, NT, ND, NABS Back:   normal alignment, no CVAT Skin:  no rash or induration seen on limited exam Musculoskeletal:  grossly normal tone BUE. RLE: 5/5, LLE: 3-4/5.  good ROM, no bony abnormality Lower extremity:  No LE edema.  Limited foot exam with no ulcerations.  2+ distal pulses. Psychiatric:  grossly normal mood and affect, speech  fluent and appropriate, Aox2.  Neurologic:  CN 2-12 grossly intact, moves all extremities in coordinated fashion, sensation intact   Radiological Exams on Admission: Independently reviewed - see discussion in A/P where applicable  CT HEAD WO CONTRAST  Result Date: 07/19/2022 CLINICAL DATA:  Mental status change, persistent or worsening EXAM: CT HEAD WITHOUT CONTRAST TECHNIQUE: Contiguous axial images were obtained from the base of the skull through the vertex without intravenous contrast. RADIATION DOSE REDUCTION: This exam was performed according to the departmental dose-optimization program which includes automated exposure control, adjustment of the mA and/or kV according to patient size and/or use of iterative reconstruction technique. COMPARISON:  CT head 07/09/2021. FINDINGS: Motion limited study. Brain: No evidence of acute large vascular territory infarct, acute hemorrhage, mass lesion, midline shift or hydrocephalus. Cerebral atrophy with ex vacuo ventricular dilation. Confluent and patchy hypodensities in the white matter, nonspecific but likely the sequela of chronic microvascular ischemic disease. Vascular: No hyperdense vessel identified. Calcific intracranial atherosclerosis. Skull: No acute fracture. Sinuses/Orbits: Largely clear sinuses.  No acute orbital findings. Other: No mastoid effusions. IMPRESSION: 1. No evidence of acute intracranial abnormality. 2. Similar chronic microvascular ischemic disease and cerebral atrophy (ICD10-G31.9). Electronically Signed   By: Margaretha Sheffield M.D.   On: 07/19/2022 15:52   DG Chest Port 1 View  Result Date: 07/19/2022 CLINICAL DATA:  Altered mental status. EXAM: PORTABLE CHEST 1 VIEW COMPARISON:  December 05, 2021. FINDINGS: Stable cardiomediastinal silhouette. Status post coronary bypass graft. Mild bibasilar subsegmental atelectasis or infiltrates are noted. Bony thorax is unremarkable. IMPRESSION: Mild bibasilar subsegmental atelectasis or  infiltrates. Electronically Signed   By: Marijo Conception M.D.   On: 07/19/2022 14:38    EKG: Independently reviewed.  NSR with rate 72; nonspecific ST changes with no evidence of acute ischemia   Labs on Admission: I  have personally reviewed the available labs and imaging studies at the time of the admission.  Pertinent labs:   BUN: 33,  creatinine: 1.29,  Assessment and Plan: Principal Problem:   Acute encephalopathy Active Problems:   Pulmonary infiltrates   Type 2 diabetes mellitus with stage 3a chronic kidney disease, without long-term current use of insulin (HCC)   Coronary artery disease involving native heart without angina pectoris   Essential hypertension   Chronic kidney disease, stage 3a (HCC)   Dementia without behavioral disturbance (HCC)   GERD without esophagitis    Assessment and Plan: * Acute encephalopathy 87 year old male presenting with 2 day history of altered mental status from his baseline dementia, weakness and questionable transient left sided facial droop that has resolved with no other focal deficits  -obs to telemetry -CT head with no acute finding -check brain MRI/neuro checks -swallow screen  -possible pneumonia and could be contributing cause, see #2 -UA pending -check metabolic labs -tele says afib, but has a lot of artifact. Initial x2 EKG in NSR and on exam sounds like NSR. Will f/u on ekg in AM and monitor on tele -protecting airway -gentle IVF -? If progressing dementia vs. Acute insult  -PT/OT for weakness  Pulmonary infiltrates Patient is afebrile with no leukocytosis, normal lactic acid, has had a cough  -received rocephin/azithromycin in ED-continue for now  -check PCT and RVP -check covid -strep/pnuemo urinary Ag -BC obtained -IS to bedside -gentle IVF -anti tussive prn    Type 2 diabetes mellitus with stage 3a chronic kidney disease, without long-term current use of insulin (HCC) A1C a year ago was 6.3 Repeat A1C Hold  metformin while inpatient SSI and accuchecks QAC/HS   Coronary artery disease involving native heart without angina pectoris Hx of CABG Can not tolerate statins Continue toprol-xl and plavix   Essential hypertension Soft blood pressures hold toprol-xl and altace   Chronic kidney disease, stage 3a (HCC) At baseline, continue to monitor   Dementia without behavioral disturbance ([place]) delerium precautions   GERD without esophagitis Continue protonix     Advance Care Planning:   Code Status: Full Code   Consults: SLP, PT, OT   DVT Prophylaxis: lovenox   Family Communication: wife and caregiver at bedside.   Severity of Illness: The appropriate patient status for this patient is OBSERVATION. Observation status is judged to be reasonable and necessary in order to provide the required intensity of service to ensure the patient's safety. The patient's presenting symptoms, physical exam findings, and initial radiographic and laboratory data in the context of their medical condition is felt to place them at decreased risk for further clinical deterioration. Furthermore, it is anticipated that the patient will be medically stable for discharge from the hospital within 2 midnights of admission.   Author: Orma Flaming, MD 07/19/2022 7:25 PM  For on call review [email or web address].

## 2022-07-19 NOTE — ED Notes (Signed)
In and Out cath performed with NT.

## 2022-07-19 NOTE — Assessment & Plan Note (Addendum)
Patient is afebrile with no leukocytosis, normal lactic acid, has had a cough  -received rocephin/azithromycin in ED-continue for now  -check PCT and RVP -check covid -strep/pnuemo urinary Ag -BC obtained -IS to bedside -gentle IVF -anti tussive prn

## 2022-07-19 NOTE — Assessment & Plan Note (Signed)
Continue protonix  

## 2022-07-19 NOTE — ED Provider Notes (Signed)
[facility] EMERGENCY DEPARTMENT Provider Note   CSN: [id number] Arrival date & time: 07/19/22  1330     History  Chief Complaint  Patient presents with   Altered Mental Status    [name] is a 87 y.o. male.  65 yo M with a chief complaint of altered mental status.  The patient was brought in via EMS.  Per the report the patient's wife and said that he has not spoken in a couple days which is atypical for him send him here for evaluation.  The patient is moderately conversant.  He denies any specific complaints.  Level 5 caveat dementia.   Altered Mental Status      Home Medications Prior to Admission medications   Medication Sig Start Date End Date Taking? Authorizing Provider  ALEVE 220 MG tablet Take 220-440 mg by mouth 2 (two) times daily as needed (for pain).    [provider]  amoxicillin (AMOXIL) 500 MG capsule Take 2,000 mg by mouth See admin instructions. Take 2,000 mg by mouth 30 minutes prior to dental appointments Patient not taking: Reported on 09/20/2021 07/24/18   [provider]  clopidogrel (PLAVIX) 75 MG tablet Take 75 mg by mouth daily with supper. Patient not taking: Reported on 09/20/2021    [provider]  doxycycline (VIBRA-TABS) 100 MG tablet Take 1 tablet (100 mg total) by mouth every 12 (twelve) hours. Patient not taking: Reported on 09/20/2021 07/13/21   Geradine Girt, DO  glucose blood [facility] VERIO) test strip Check sugars daily Dx: E11.49 09/11/18   [provider]  magnesium oxide (MAG-OX) 400 (240 Mg) MG tablet Take 1 tablet (400 mg total) by mouth daily. 07/14/21   Geradine Girt, DO  metFORMIN (GLUCOPHAGE) 1000 MG tablet Take 1 tablet (1,000 mg total) by mouth daily with supper. 07/13/21   Geradine Girt, DO  metoprolol succinate (TOPROL-XL) 25 MG 24 hr tablet Take 25 mg by mouth daily with supper.    [provider]  Misc. Devices [facility]) MISC Dx: Muscle Weakness  11/27/19   [provider]  nystatin (MYCOSTATIN/NYSTOP) powder SMARTSIG:1 Topical Daily PRN 07/29/21   [provider]  OneTouch [employer]ica Lancets 09N MISC Pt to check BS daily  Dx: E11.49 02/24/20   [provider]  pantoprazole (PROTONIX) 40 MG tablet Take 40 mg by mouth daily before supper. 04/14/20   [provider]  polyethylene glycol (MIRALAX / GLYCOLAX) 17 g packet Take 17 g by mouth daily as needed for mild constipation. 07/13/21   Geradine Girt, DO  ramipril (ALTACE) 5 MG capsule Take 5 mg by mouth daily. 09/10/21   [provider]      Allergies    Atorvastatin, Rosuvastatin, Simvastatin, and Sulfonamide derivatives    Review of Systems   Review of Systems  Physical Exam Updated Vital Signs BP 101/87   Pulse 73   Temp 97.9 F (36.6 C) (Oral)   Resp (!) 22   SpO2 100%  Physical Exam Vitals and nursing note reviewed.  Constitutional:      Appearance: He is well-developed.  HENT:     Head: Normocephalic and atraumatic.  Eyes:     Pupils: Pupils are equal, round, and reactive to light.  Neck:     Vascular: No JVD.  Cardiovascular:     Rate and Rhythm: Normal rate and regular rhythm.     Heart sounds: No murmur heard.    No friction rub. No gallop.  Pulmonary:     Effort: No respiratory distress.     Breath sounds: No wheezing.  Abdominal:     General: There is no distension.     Tenderness: There is no abdominal tenderness. There is no guarding or rebound.  Musculoskeletal:        General: Normal range of motion.     Cervical back: Normal range of motion and neck supple.  Skin:    Coloration: Skin is not pale.     Findings: No rash.  Neurological:     Mental Status: He is alert.  Psychiatric:        Behavior: Behavior normal.     ED Results / Procedures / Treatments   Labs (all labs ordered are listed, but only abnormal results are displayed) Labs Reviewed  COMPREHENSIVE METABOLIC PANEL - Abnormal; Notable for  the following components:      Result Value   Sodium 134 (*)    Glucose, Bld 186 (*)    BUN 33 (*)    Creatinine, Ser 1.29 (*)    GFR, Estimated 54 (*)    All other components within normal limits  I-STAT VENOUS BLOOD GAS, ED - Abnormal; Notable for the following components:   pO2, Ven 85 (*)    Sodium 133 (*)    Potassium 5.9 (*)    Calcium, Ion 1.14 (*)    All other components within normal limits  CULTURE, BLOOD (ROUTINE X 2)  CULTURE, BLOOD (ROUTINE X 2)  RESP PANEL BY RT-PCR (FLU A&B, COVID) ARPGX2  CBC WITH DIFFERENTIAL/PLATELET  ETHANOL  LACTIC ACID, PLASMA  URINALYSIS, ROUTINE W REFLEX MICROSCOPIC  AMMONIA  RAPID URINE DRUG SCREEN, HOSP PERFORMED  CBG MONITORING, ED    EKG EKG Interpretation  Date/Time:  Tuesday July 19 2022 13:40:40 EDT Ventricular Rate:  58 PR Interval:  270 QRS Duration: 81 QT Interval:  398 QTC Calculation: 391 R Axis:   64 Text Interpretation: Sinus rhythm Supraventricular bigeminy Prolonged PR interval 12 Lead; Mason-Likar No significant change since last tracing Confirmed by Deno Etienne [phone]) on 07/19/2022 1:47:15 PM  Radiology CT HEAD WO CONTRAST  Result Date: 07/19/2022 CLINICAL DATA:  Mental status change, persistent or worsening EXAM: CT HEAD WITHOUT CONTRAST TECHNIQUE: Contiguous axial images were obtained from the base of the skull through the vertex without intravenous contrast. RADIATION DOSE REDUCTION: This exam was performed according to the departmental dose-optimization program which includes automated exposure control, adjustment of the mA and/or kV according to patient size and/or use of iterative reconstruction technique. COMPARISON:  CT head 07/09/2021. FINDINGS: Motion limited study. Brain: No evidence of acute large vascular territory infarct, acute hemorrhage, mass lesion, midline shift or hydrocephalus. Cerebral atrophy with ex vacuo ventricular dilation. Confluent and patchy hypodensities in the white matter,  nonspecific but likely the sequela of chronic microvascular ischemic disease. Vascular: No hyperdense vessel identified. Calcific intracranial atherosclerosis. Skull: No acute fracture. Sinuses/Orbits: Largely clear sinuses.  No acute orbital findings. Other: No mastoid effusions. IMPRESSION: 1. No evidence of acute intracranial abnormality. 2. Similar chronic microvascular ischemic disease and cerebral atrophy (ICD10-G31.9). Electronically Signed   By: Margaretha Sheffield M.D.   On: 07/19/2022 15:52   DG Chest Port 1 View  Result Date: 07/19/2022 CLINICAL DATA:  Altered mental status. EXAM: PORTABLE CHEST 1 VIEW COMPARISON:  December 05, 2021. FINDINGS: Stable cardiomediastinal silhouette. Status post coronary bypass graft. Mild bibasilar subsegmental atelectasis or infiltrates are noted. Bony thorax is unremarkable. IMPRESSION: Mild bibasilar subsegmental atelectasis or infiltrates. Electronically  Signed   By: Marijo Conception M.D.   On: 07/19/2022 14:38    Procedures Procedures    Medications Ordered in ED Medications  sodium chloride 0.9 % bolus 1,000 mL (has no administration in time range)  cefTRIAXone (ROCEPHIN) 2 g in sodium chloride 0.9 % 100 mL IVPB (has no administration in time range)  azithromycin (ZITHROMAX) 500 mg in sodium chloride 0.9 % 250 mL IVPB (has no administration in time range)    ED Course/ Medical Decision Making/ A&P                           Medical Decision Making Amount and/or Complexity of Data Reviewed Labs: ordered. Radiology: ordered.   87 yo M with a chief complaint of altered mental status.  This is reported by the wife.  The patient has no complaints on arrival.  On record review the patient's last admission he was noted to be unable to provide any history.  Attempted to contact the wife x4 without response.  We will obtain an altered mental status work-up to start.  Bolus of IV fluids.  Reassess.  Patient's family arrived and provided further history.   Tells me that he has been much less active and has been not been as responsive as normal.  Typically does not speak much but has been speaking much less and typically ambulates with a walker but has not been able to for about 48 hours.  Has been coughing a bit for the past 48 hours or so.  No known sick contacts no fevers no vomiting no diarrhea has been eating and drinking well.  The patient's chest x-ray with possible bilateral lower lobe pneumonia.  No leukocytosis lactate is normal.  No hypercarbia.  Curb 65 of 3 will start on abx.  Discuss with medicine.   The patients results and plan were reviewed and discussed.   Any x-rays performed were independently reviewed by myself.   Differential diagnosis were considered with the presenting HPI.  Medications  sodium chloride 0.9 % bolus 1,000 mL (has no administration in time range)  cefTRIAXone (ROCEPHIN) 2 g in sodium chloride 0.9 % 100 mL IVPB (has no administration in time range)  azithromycin (ZITHROMAX) 500 mg in sodium chloride 0.9 % 250 mL IVPB (has no administration in time range)    Vitals:   07/19/22 1334 07/19/22 1337 07/19/22 1500 07/19/22 1530  BP:  135/61 (!) 116/54 101/87  Pulse:  69 61 73  Resp:  20 20 (!) 22  Temp:  97.9 F (36.6 C)    TempSrc:  Oral    SpO2: 96% 98% 98% 100%    Final diagnoses:  Community acquired pneumonia, unspecified laterality    Admission/ observation were discussed with the admitting physician, patient and/or family and they are comfortable with the plan.             Final Clinical Impression(s) / ED Diagnoses Final diagnoses:  Community acquired pneumonia, unspecified laterality    Rx / DC Orders ED Discharge Orders     None         Deno Etienne, DO 07/19/22 1613

## 2022-07-19 NOTE — Assessment & Plan Note (Addendum)
Soft blood pressures hold toprol-xl and altace

## 2022-07-19 NOTE — ED Triage Notes (Signed)
Pt BIB [facility] from home per the wife patient has been altered for 2 days. Pt is not talking seems to be understanding but will not speak. When EMS attached pt to the monitor he was in a-fib per wife he has no hx of a-fib. Pt received 500 NS bolus with EMS. Pt answering questions for this nurse.

## 2022-07-19 NOTE — ED Notes (Signed)
Patient transported to MRI 

## 2022-07-19 NOTE — Assessment & Plan Note (Signed)
At baseline, continue to monitor °

## 2022-07-19 NOTE — Assessment & Plan Note (Signed)
A1C a year ago was 6.3 Repeat A1C Hold metformin while inpatient SSI and accuchecks QAC/HS

## 2022-07-19 NOTE — Assessment & Plan Note (Signed)
delerium precautions

## 2022-07-19 NOTE — Assessment & Plan Note (Addendum)
87 year old male presenting with 2 day history of altered mental status from his baseline dementia, weakness and questionable transient left sided facial droop that has resolved with no other focal deficits  -obs to telemetry -CT head with no acute finding -check brain MRI/neuro checks -swallow screen  -possible pneumonia and could be contributing cause, see #2 -UA pending -check metabolic labs -tele says afib, but has a lot of artifact. Initial x2 EKG in NSR and on exam sounds like NSR. Will f/u on ekg in AM and monitor on tele -protecting airway -gentle IVF -? If progressing dementia vs. Acute insult  -PT/OT for weakness

## 2022-07-19 NOTE — Assessment & Plan Note (Signed)
Hx of CABG Can not tolerate statins Continue toprol-xl and plavix

## 2022-07-20 ENCOUNTER — Observation Stay (HOSPITAL_COMMUNITY): Payer: Medicare Other

## 2022-07-20 DIAGNOSIS — K219 Gastro-esophageal reflux disease without esophagitis: Secondary | ICD-10-CM | POA: Diagnosis present

## 2022-07-20 DIAGNOSIS — I639 Cerebral infarction, unspecified: Secondary | ICD-10-CM

## 2022-07-20 DIAGNOSIS — I129 Hypertensive chronic kidney disease with stage 1 through stage 4 chronic kidney disease, or unspecified chronic kidney disease: Secondary | ICD-10-CM | POA: Diagnosis present

## 2022-07-20 DIAGNOSIS — I2510 Atherosclerotic heart disease of native coronary artery without angina pectoris: Secondary | ICD-10-CM | POA: Diagnosis not present

## 2022-07-20 DIAGNOSIS — E785 Hyperlipidemia, unspecified: Secondary | ICD-10-CM | POA: Diagnosis present

## 2022-07-20 DIAGNOSIS — Z20822 Contact with and (suspected) exposure to covid-19: Secondary | ICD-10-CM | POA: Diagnosis present

## 2022-07-20 DIAGNOSIS — I6389 Other cerebral infarction: Secondary | ICD-10-CM | POA: Diagnosis not present

## 2022-07-20 DIAGNOSIS — R29702 NIHSS score 2: Secondary | ICD-10-CM | POA: Diagnosis present

## 2022-07-20 DIAGNOSIS — Z888 Allergy status to other drugs, medicaments and biological substances status: Secondary | ICD-10-CM | POA: Diagnosis not present

## 2022-07-20 DIAGNOSIS — I6523 Occlusion and stenosis of bilateral carotid arteries: Secondary | ICD-10-CM | POA: Diagnosis not present

## 2022-07-20 DIAGNOSIS — Z87891 Personal history of nicotine dependence: Secondary | ICD-10-CM | POA: Diagnosis not present

## 2022-07-20 DIAGNOSIS — I471 Supraventricular tachycardia: Secondary | ICD-10-CM | POA: Diagnosis not present

## 2022-07-20 DIAGNOSIS — T8119XA Other postprocedural shock, initial encounter: Secondary | ICD-10-CM | POA: Diagnosis not present

## 2022-07-20 DIAGNOSIS — D62 Acute posthemorrhagic anemia: Secondary | ICD-10-CM | POA: Diagnosis not present

## 2022-07-20 DIAGNOSIS — E1122 Type 2 diabetes mellitus with diabetic chronic kidney disease: Secondary | ICD-10-CM | POA: Diagnosis present

## 2022-07-20 DIAGNOSIS — I63512 Cerebral infarction due to unspecified occlusion or stenosis of left middle cerebral artery: Secondary | ICD-10-CM | POA: Diagnosis not present

## 2022-07-20 DIAGNOSIS — R579 Shock, unspecified: Secondary | ICD-10-CM | POA: Diagnosis not present

## 2022-07-20 DIAGNOSIS — N189 Chronic kidney disease, unspecified: Secondary | ICD-10-CM | POA: Diagnosis not present

## 2022-07-20 DIAGNOSIS — J189 Pneumonia, unspecified organism: Secondary | ICD-10-CM | POA: Diagnosis present

## 2022-07-20 DIAGNOSIS — I4891 Unspecified atrial fibrillation: Secondary | ICD-10-CM | POA: Diagnosis present

## 2022-07-20 DIAGNOSIS — Z7984 Long term (current) use of oral hypoglycemic drugs: Secondary | ICD-10-CM | POA: Diagnosis not present

## 2022-07-20 DIAGNOSIS — Z79899 Other long term (current) drug therapy: Secondary | ICD-10-CM | POA: Diagnosis not present

## 2022-07-20 DIAGNOSIS — Y838 Other surgical procedures as the cause of abnormal reaction of the patient, or of later complication, without mention of misadventure at the time of the procedure: Secondary | ICD-10-CM | POA: Diagnosis not present

## 2022-07-20 DIAGNOSIS — R042 Hemoptysis: Secondary | ICD-10-CM | POA: Diagnosis not present

## 2022-07-20 DIAGNOSIS — Z951 Presence of aortocoronary bypass graft: Secondary | ICD-10-CM | POA: Diagnosis not present

## 2022-07-20 DIAGNOSIS — R918 Other nonspecific abnormal finding of lung field: Secondary | ICD-10-CM | POA: Diagnosis not present

## 2022-07-20 DIAGNOSIS — I083 Combined rheumatic disorders of mitral, aortic and tricuspid valves: Secondary | ICD-10-CM | POA: Diagnosis present

## 2022-07-20 DIAGNOSIS — Z23 Encounter for immunization: Secondary | ICD-10-CM | POA: Diagnosis present

## 2022-07-20 DIAGNOSIS — G8191 Hemiplegia, unspecified affecting right dominant side: Secondary | ICD-10-CM | POA: Diagnosis present

## 2022-07-20 DIAGNOSIS — G9340 Encephalopathy, unspecified: Secondary | ICD-10-CM | POA: Diagnosis not present

## 2022-07-20 DIAGNOSIS — G9341 Metabolic encephalopathy: Secondary | ICD-10-CM | POA: Diagnosis present

## 2022-07-20 DIAGNOSIS — I6522 Occlusion and stenosis of left carotid artery: Secondary | ICD-10-CM | POA: Diagnosis not present

## 2022-07-20 DIAGNOSIS — I63232 Cerebral infarction due to unspecified occlusion or stenosis of left carotid arteries: Secondary | ICD-10-CM | POA: Diagnosis present

## 2022-07-20 DIAGNOSIS — R531 Weakness: Secondary | ICD-10-CM | POA: Diagnosis not present

## 2022-07-20 DIAGNOSIS — E1151 Type 2 diabetes mellitus with diabetic peripheral angiopathy without gangrene: Secondary | ICD-10-CM | POA: Diagnosis present

## 2022-07-20 DIAGNOSIS — I10 Essential (primary) hypertension: Secondary | ICD-10-CM | POA: Diagnosis not present

## 2022-07-20 DIAGNOSIS — E1136 Type 2 diabetes mellitus with diabetic cataract: Secondary | ICD-10-CM | POA: Diagnosis present

## 2022-07-20 DIAGNOSIS — N1831 Chronic kidney disease, stage 3a: Secondary | ICD-10-CM | POA: Diagnosis present

## 2022-07-20 DIAGNOSIS — F0390 Unspecified dementia without behavioral disturbance: Secondary | ICD-10-CM | POA: Diagnosis present

## 2022-07-20 LAB — ECHOCARDIOGRAM COMPLETE
AR max vel: 2.37 cm2
AV Area VTI: 2.22 cm2
AV Area mean vel: 2.2 cm2
AV Mean grad: 3 mmHg
AV Peak grad: 5 mmHg
Ao pk vel: 1.12 m/s
Area-P 1/2: 3.53 cm2
Height: 74 in
MV M vel: 3.91 m/s
MV Peak grad: 61.3 mmHg
S' Lateral: 3.2 cm
Weight: 2800 oz

## 2022-07-20 LAB — BASIC METABOLIC PANEL
Anion gap: 9 (ref 5–15)
BUN: 25 mg/dL — ABNORMAL HIGH (ref 8–23)
CO2: 24 mmol/L (ref 22–32)
Calcium: 9.2 mg/dL (ref 8.9–10.3)
Chloride: 105 mmol/L (ref 98–111)
Creatinine, Ser: 1.1 mg/dL (ref 0.61–1.24)
GFR, Estimated: >60 mL/min (ref >60)
Glucose, Bld: 105 mg/dL — ABNORMAL HIGH (ref 70–99)
Potassium: 4.2 mmol/L (ref 3.5–5.1)
Sodium: 138 mmol/L (ref 135–145)

## 2022-07-20 LAB — CBC
HCT: 36.4 % — ABNORMAL LOW (ref 39.0–52.0)
Hemoglobin: 12.5 g/dL — ABNORMAL LOW (ref 13.0–17.0)
MCH: 30.4 pg (ref 26.0–34.0)
MCHC: 34.3 g/dL (ref 30.0–36.0)
MCV: 88.6 fL (ref 80.0–100.0)
Platelets: 171 10*3/uL (ref 150–400)
RBC: 4.11 MIL/uL — ABNORMAL LOW (ref 4.22–5.81)
RDW: 13.1 % (ref 11.5–15.5)
WBC: 8 10*3/uL (ref 4.0–10.5)
nRBC: 0 % (ref 0.0–0.2)

## 2022-07-20 LAB — CBG MONITORING, ED
Glucose-Capillary: 93 mg/dL (ref 70–99)
Glucose-Capillary: 98 mg/dL (ref 70–99)
Glucose-Capillary: 129 mg/dL — ABNORMAL HIGH (ref 70–99)

## 2022-07-20 LAB — STREP PNEUMONIAE URINARY ANTIGEN: Strep Pneumo Urinary Antigen: NEGATIVE

## 2022-07-20 LAB — GLUCOSE, CAPILLARY: Glucose-Capillary: 127 mg/dL — ABNORMAL HIGH (ref 70–99)

## 2022-07-20 MED ORDER — IOHEXOL 350 MG/ML SOLN
50.0000 mL | Freq: Once | INTRAVENOUS | Status: AC | PRN
  Administered 2022-07-20: 50 mL via INTRAVENOUS

## 2022-07-20 MED ORDER — STROKE: EARLY STAGES OF RECOVERY BOOK
Freq: Once | Status: AC
  Filled 2022-07-20: qty 1

## 2022-07-20 MED ORDER — ASPIRIN 325 MG PO TABS
325.0000 mg | ORAL_TABLET | Freq: Every day | ORAL | Status: DC
  Administered 2022-07-20 – 2022-07-30 (×9): 325 mg via ORAL
  Filled 2022-07-20 (×10): qty 1

## 2022-07-20 MED ORDER — ASPIRIN 300 MG RE SUPP
300.0000 mg | Freq: Every day | RECTAL | Status: DC
  Filled 2022-07-20 (×5): qty 1

## 2022-07-20 NOTE — ED Notes (Signed)
Portable equipment called for tele-sitter.

## 2022-07-20 NOTE — Evaluation (Signed)
Physical Therapy Evaluation Patient Details Name: [name] MRN: [id number] DOB: [birth year] Today's Date: 07/20/2022  History of Present Illness  Pt is an 87 y/o male presenting with increased confusion. Imaging showed L BG infarct. PMH: CAD, DM, GERD, dementia, HTN, PAD , R TKA  Clinical Impression  Pt admitted secondary to problem above with deficits below. Pt requiring max to total A +2 for all mobility tasks. Heavy posterior lean as well. Per family/caregiver, pt was able to ambulate with RW prior to a few days ago. Given new deficits, recommending SNF level therapies to increase independence and safety and decrease caregiver burden. Will continue to follow acutely.        Recommendations for follow up therapy are one component of a multi-disciplinary discharge planning process, led by the attending physician.  Recommendations may be updated based on patient status, additional functional criteria and insurance authorization.  Follow Up Recommendations Skilled nursing-short term rehab (<3 hours/day) Can patient physically be transported by private vehicle: No    Assistance Recommended at Discharge Frequent or constant Supervision/Assistance  Patient can return home with the following  Two people to help with walking and/or transfers;Two people to help with bathing/dressing/bathroom;Assistance with cooking/housework;Help with stairs or ramp for entrance;Assist for transportation    Equipment Recommendations None recommended by PT  Recommendations for Other Services       Functional Status Assessment Patient has had a recent decline in their functional status and demonstrates the ability to make significant improvements in function in a reasonable and predictable amount of time.     Precautions / Restrictions Precautions Precautions: Fall Restrictions Weight Bearing Restrictions: No      Mobility  Bed Mobility Overal bed mobility: Needs Assistance Bed Mobility: Supine to Sit,  Sit to Supine     Supine to sit: Max assist, +2 for physical assistance, +2 for safety/equipment, HOB elevated Sit to supine: Total assist, +2 for safety/equipment, +2 for physical assistance   General bed mobility comments: Pt able to assist some in bringing BLE to EOB but required assist to fully scoot hips, lift trunk with noted posterior lean. Total A x 2 to get back on stretcher    Transfers Overall transfer level: Needs assistance Equipment used: Rolling walker (2 wheels), 2 person hand held assist Transfers: Sit to/from Stand             General transfer comment: Trialed standing with RW though very narrow BOS and pushing back when attempting to stand. Required blocking of feet and only able to clear hips partially. Attempted to trial standing with 2 person HHA, but pt with heavy posterior lean and unable to come forwards.    Ambulation/Gait                  Stairs            Wheelchair Mobility    Modified Rankin (Stroke Patients Only)       Balance Overall balance assessment: Needs assistance Sitting-balance support: No upper extremity supported, Feet supported Sitting balance-Leahy Scale: Poor Sitting balance - Comments: Variable Mod-Max A for sitting balance Postural control: Posterior lean                                   Pertinent Vitals/Pain Pain Assessment Pain Assessment: No/denies pain    Home Living Family/patient expects to be discharged to:: Private residence Living Arrangements: Spouse/significant other Available Help at Discharge:  Personal care attendant;Family Type of Home: House Home Access: Ramped entrance       Home Layout: One level Home Equipment: IT sales professional (4 wheels);BSC/3in1;Shower seat - built in;Other (comment);Hospital bed (hoyer lift) Additional Comments: Caregiver 8 hours, 7 days/week    Prior Function Prior Level of Function : Needs assist             Mobility Comments:  Using Wheelchair the last few weeks. Has been needing assist for transfers and sometimes requires hoyer. Prior to a few days ago, pt ambulatory around the home with RW and only used w/c outside of the home ADLs Comments: Wife and caregiver assists with ADLs.     Hand Dominance   Dominant Hand: Right    Extremity/Trunk Assessment   Upper Extremity Assessment Upper Extremity Assessment: Defer to OT evaluation    Lower Extremity Assessment Lower Extremity Assessment: Generalized weakness    Cervical / Trunk Assessment Cervical / Trunk Assessment: Kyphotic  Communication   Communication: Expressive difficulties  Cognition Arousal/Alertness: Awake/alert Behavior During Therapy: WFL for tasks assessed/performed, Flat affect Overall Cognitive Status: History of cognitive impairments - at baseline                                 General Comments: hx of dementia, follows one step directions fairly consistently. minimally verbal but does answer direct questions        General Comments General comments (skin integrity, edema, etc.): Wife and caregiver present    Exercises     Assessment/Plan    PT Assessment Patient needs continued PT services  PT Problem List Decreased strength;Decreased balance;Decreased activity tolerance;Decreased mobility;Decreased cognition;Decreased safety awareness;Decreased knowledge of use of DME;Decreased knowledge of precautions       PT Treatment Interventions DME instruction;Gait training;Therapeutic activities;Functional mobility training;Therapeutic exercise;Balance training;Patient/family education    PT Goals (Current goals can be found in the Care Plan section)  Acute Rehab PT Goals Patient Stated Goal: for pt to get stronger per family PT Goal Formulation: With patient Time For Goal Achievement: 08/03/22 Potential to Achieve Goals: Good    Frequency Min 3X/week     Co-evaluation PT/OT/SLP Co-Evaluation/Treatment:  Yes Reason for Co-Treatment: For patient/therapist safety;To address functional/ADL transfers PT goals addressed during session: Mobility/safety with mobility;Balance OT goals addressed during session: ADL's and self-care;Strengthening/ROM       AM-PAC PT "6 Clicks" Mobility  Outcome Measure Help needed turning from your back to your side while in a flat bed without using bedrails?: A Lot Help needed moving from lying on your back to sitting on the side of a flat bed without using bedrails?: Total Help needed moving to and from a bed to a chair (including a wheelchair)?: Total Help needed standing up from a chair using your arms (e.g., wheelchair or bedside chair)?: Total Help needed to walk in hospital room?: Total Help needed climbing 3-5 steps with a railing? : Total 6 Click Score: 7    End of Session Equipment Utilized During Treatment: Gait belt Activity Tolerance: Patient tolerated treatment well Patient left: in bed;with call bell/phone within reach;with family/visitor present (on stretcher in ED) Nurse Communication: Mobility status PT Visit Diagnosis: Unsteadiness on feet (R26.81);Muscle weakness (generalized) (M62.81);Difficulty in walking, not elsewhere classified (R26.2)    Time: 1350-1408 PT Time Calculation (min) (ACUTE ONLY): 18 min   Charges:   PT Evaluation $PT Eval Moderate Complexity: 1 Mod  Lou Miner, DPT  Acute Rehabilitation Services  Office: [phone]   Rudean Hitt 07/20/2022, 4:18 PM

## 2022-07-20 NOTE — Evaluation (Signed)
Speech Language Pathology Evaluation Patient Details Name: [name] MRN: [id number] DOB: 11-11-1934 Today's Date: 07/20/2022 Time: 1220-1300 SLP Time Calculation (min) (ACUTE ONLY): 40 min  Problem List:  Patient Active Problem List   Diagnosis Date Noted   Acute encephalopathy 07/19/2022   Pulmonary infiltrates 93/26/7124   Acute metabolic encephalopathy 58/07/9832   Mixed diabetic hyperlipidemia associated with type 2 diabetes mellitus ([place]) 07/10/2021   GERD without esophagitis 07/10/2021   Chronic kidney disease, stage 3a ([place]) 07/10/2021   Dementia without behavioral disturbance ([place]) 07/10/2021   Coronary artery disease involving native heart without angina pectoris 07/10/2021   Pneumonia of left lower lobe due to infectious organism 07/09/2021   Laceration of left index finger without foreign body without damage to nail 08/24/2017   Type 2 diabetes mellitus with stage 3a chronic kidney disease, without long-term current use of insulin ([place]) 11/04/2015   Left carotid bruit 11/04/2015   PVD with claudication - s/p PV surgery May 2016 03/23/2015   Intractable hiccups 02/16/2015   Dehydration 02/16/2015   Hyponatremia 02/16/2015   Perineal abscess 06/20/2013   Essential hypertension    Hx of CABG 2009    Hyperlipidemia    Chronic constipation 06/27/2012   Past Medical History:  Past Medical History:  Diagnosis Date   Arthritis    CAD (coronary artery disease)    Cataract    Diabetes mellitus    GERD (gastroesophageal reflux disease)    Helicobacter pylori gastritis 01/2006, 03/2011   EGD - Pylera Tx 2010   Hiccups    History of kidney stones    Hyperlipidemia    Hypertension    PAD (peripheral artery disease) ([place])    Past Surgical History:  Past Surgical History:  Procedure Laterality Date   CATARACT EXTRACTION     right   COLONOSCOPY  2002, 2007, 06/09/2011   Dr. Evelina Bucy - [name], [place]: for FHx colon cancer, no adenomas; 2012: [facility] - normal   CORONARY  ARTERY BYPASS GRAFT  05-07-2008   ENDARTERECTOMY FEMORAL Bilateral 03/23/2015   Procedure: ENDARTERECTOMY OF BILATERAL COMMON FEMORAL AND PROFUNDA ARTERIES;  Surgeon: Rosetta Posner, MD;  Location: [facility];  Service: Vascular;  Laterality: Bilateral;   ESOPHAGOGASTRODUODENOSCOPY  02/20/06   Dr. Evelina Bucy, [name], [place]. pylori gastritis and GERD changes (pathology)   EYE SURGERY Left    cataract   HERNIA REPAIR  12/24/2010   INSERTION OF ILIAC STENT Bilateral 03/23/2015   Procedure: LEFT EXTERNAL ILIAC AND BILATERAL COMMON ILIAC STENT;  Surgeon: Rosetta Posner, MD;  Location: [facility] OR;  Service: Vascular;  Laterality: Bilateral;   JOINT REPLACEMENT     right knee replacement   PATCH ANGIOPLASTY Bilateral 03/23/2015   Procedure: PATCH ANGIOPLASTY OF BILATERAL COMMON FEMORAL ARTERIES;  Surgeon: Rosetta Posner, MD;  Location: [facility];  Service: Vascular;  Laterality: Bilateral;   PERIPHERAL VASCULAR CATHETERIZATION N/A 03/11/2015   Procedure: Abdominal Aortogram w/Lower Extremity;  Surgeon: Rosetta Posner, MD;  Location: [facility];  Service: Cardiovascular;  Laterality: N/A;   TOTAL KNEE ARTHROPLASTY  07-13-2012   right   UPPER GASTROINTESTINAL ENDOSCOPY  03/09/2009   Dr. Carlean Purl: H. pylori gastritis (Pylera Tx) and 2 cm hiatal hernia   HPI:  [name] is a 87 y.o. male with medical history significant of CAD, T2DM, GERD, HLD, HTN, PAD, hx of CVA who presented to ED with change in mental status. At baseline he does talk, but very little. He walks around the house with walker. He  needs full assist x 2 for showering. He needs help with dressing. He can feed himself. No falls in the last year.  He has not had any fever/chills. No open sores or bed sores. He has been coughing for 2 days that is dry,but at times is clear phlegm. No odor to urine, unsure of color change. He has been eating and drinking well. Pulmonary infiltratesBeing treated for possible pneumonia. MRI shows left basal ganglia CVA. Pt had MBS in  July 20223 that showed esopahgeal stasis, flash penetration, oral holding.   Assessment / Plan / Recommendation Clinical Impression  Pt demonstrates cognitive based language impairment that is consistent with baseline communication function per family. Pt typically does not communicate much, but is able to follow one step commands with extra time, and verbalize simple wants and needs. He can name objects that SLP points to, does not respond well with responsive or abstract questions. Pt is at communicative baseline, no need for f/u SLP interventions.    SLP Assessment  SLP Recommendation/Assessment: Patient does not need any further Speech [place] [facility] SLP Visit Diagnosis: Cognitive communication deficit (R41.841)    Recommendations for follow up therapy are one component of a multi-disciplinary discharge planning process, led by the attending physician.  Recommendations may be updated based on patient status, additional functional criteria and insurance authorization.    Follow Up Recommendations  No SLP follow up    Assistance Recommended at Discharge     Functional Status Assessment    Frequency and Duration           SLP Evaluation Cognition  Overall Cognitive Status: History of cognitive impairments - at baseline Arousal/Alertness: Awake/alert Orientation Level: Oriented to person Attention: Focused Focused Attention: Appears intact Memory: Impaired Memory Impairment: Storage deficit;Retrieval deficit       Comprehension  Auditory Comprehension Overall Auditory Comprehension: Impaired Yes/No Questions: Within Functional Limits Commands: Impaired One Step Basic Commands: 75-100% accurate Two Step Basic Commands: 0-24% accurate    Expression Verbal Expression Overall Verbal Expression: Impaired Initiation: Impaired Automatic Speech: Name;Social Response;Counting Level of Generative/Spontaneous Verbalization: Phrase;Word Repetition: No  impairment Naming: Impairment Confrontation: Impaired Verbal Errors: Not aware of errors   Oral / Motor  Oral Motor/Sensory Function Overall Oral Motor/Sensory Function: Within functional limits Motor Speech Overall Motor Speech: Appears within functional limits for tasks assessed            Shine Mikes, Katherene Ponto 07/20/2022, 1:53 PM

## 2022-07-20 NOTE — Progress Notes (Signed)
PROGRESS NOTE    [name]  JJO:[id number] DOB: 04/16/1935 DOA: 07/19/2022 PCP: Ginger Organ., MD  Chief Complaint  Patient presents with   Altered Mental Status    Brief Narrative:  [name] is [name] 87 y.o. male with medical history significant of CAD, T2DM, GERD, HLD, HTN, PAD, hx of CVA who presented to ED with change in mental status two days ago.  For 2 days he has been lethargic and confused. His caregiver also thought he had Ketan Renz left facial droop and so did the hairdresser. She thinks this is better. He has chronic left sided weakness and this seems unchanged.   He has stopped talking almost completely. He typically will converse with his wife some, but this is abnormal for him. He typically walks with Juanell Saffo walker, but 2 days ago was in wheel chair.    At baseline he does talk, but very little. He walks around the house with walker. He needs full assist x 2 for showering. He needs help with dressing. He can feed himself. No falls in the last year.    He has not had any fever/chills. No open sores or bed sores. He has been coughing for 2 days that is dry,but at times is clear phlegm. No odor to urine, unsure of color change. He has been eating and drinking well. No N/V/D.      He does not smoke or drink alcohol.    ER Course:  vitals: afebrile, bp: 135/61, HR; 69, RR: 20, oxygen: 98%RA Pertinent labs: BUN: 33, creatinine: 1.29,  CXR: mild bibasilar subsegmental atelectasis or infiltrates CT head: no acute finding  In ED: given 1L IVF and rocephin/azithromycin. BC obtained. TRH asked to admit.   Assessment & Plan:   Principal Problem:   Acute encephalopathy Active Problems:   Pulmonary infiltrates   Type 2 diabetes mellitus with stage 3a chronic kidney disease, without long-term current use of insulin (HCC)   Coronary artery disease involving native heart without angina pectoris   Essential hypertension   Chronic kidney disease, stage 3a (HCC)   Dementia without  behavioral disturbance ([place])   GERD without esophagitis   Assessment and Plan: Left Basal Ganglia Infarct ? Related to encephalopathy below MRI with 2.3 cm acute ischemic L basal ganglia infarct CTA head/neck with near occlusion of proximal M1 segment of L MCA with reconstitution of distal M1 and M2 segments, severe stenosis at origin of bilateral internal carotid arteries Echo pending A1c 6.5, lipid panel pending, TSH pending PT/OT/SLP Continue tele monitoring  Already on plavix, aspirin started.  Has not tolerated statins in the past. Appreciate neurology assistance  Acute encephalopathy  History of Dementia Related to stroke vs pneumonia MRI as above UA not c/w UTI TSH wnl B12 low normal, follow MMA -PT/OT for weakness Delirium precautions   Pulmonary infiltrates Being treated for possible pneumonia Patient is afebrile with no leukocytosis, normal lactic acid, has had Lakenzie Mcclafferty cough  CAP coverage Urine strep, urine legionella Negative RVP negative covid/flu Blood cx pending   Type 2 diabetes mellitus with stage 3a chronic kidney disease, without long-term current use of insulin (HCC) A1c 6.5 Hold metformin while inpatient SSI and accuchecks QAC/HS    Coronary artery disease involving native heart without angina pectoris Hx of CABG Can not tolerate statins Continue toprol-xl and plavix    Essential hypertension Soft blood pressures hold toprol-xl and altace    Chronic kidney disease, stage 3a (HCC) At baseline, continue to monitor  Dementia without behavioral disturbance ([place]) delerium precautions    GERD without esophagitis Continue protonix       DVT prophylaxis: lovenox Code Status: full Family Communication: wife at bedside Disposition:   Status is: Observation The patient remains OBS appropriate and will d/c before 2 midnights.   Consultants:  neurology  Procedures:  none  Antimicrobials:  Anti-infectives (From admission, onward)     Start     Dose/Rate Route Frequency Ordered Stop   07/20/22 1745  cefTRIAXone (ROCEPHIN) 2 g in sodium chloride 0.9 % 100 mL IVPB        2 g 200 mL/hr over 30 Minutes Intravenous Every 24 hours 07/19/22 1744 07/25/22 1744   07/20/22 1600  azithromycin (ZITHROMAX) 500 mg in sodium chloride 0.9 % 250 mL IVPB        500 mg 250 mL/hr over 60 Minutes Intravenous Every 24 hours 07/19/22 1745 07/25/22 1559   07/19/22 1615  cefTRIAXone (ROCEPHIN) 2 g in sodium chloride 0.9 % 100 mL IVPB        2 g 200 mL/hr over 30 Minutes Intravenous  Once 07/19/22 1607 07/19/22 1725   07/19/22 1615  azithromycin (ZITHROMAX) 500 mg in sodium chloride 0.9 % 250 mL IVPB        500 mg 250 mL/hr over 60 Minutes Intravenous  Once 07/19/22 1607 07/19/22 1742       Subjective: Confused Denies any pain/discomfort Wife at bedside  Objective: Vitals:   07/20/22 0645 07/20/22 0700 07/20/22 0715 07/20/22 0943  BP: 135/63 132/65 (!) 132/50   Pulse: (!) 50 (!) 48 (!) 48   Resp: '12 10 17   '$ Temp:    (!) 97.5 F (36.4 C)  TempSrc:    Oral  SpO2: 100% 100% 100%   Weight:      Height:        Intake/Output Summary (Last 24 hours) at 07/20/2022 1015 Last data filed at 07/20/2022 0645 Gross per 24 hour  Intake 1467.43 ml  Output --  Net 1467.43 ml   Filed Weights   07/19/22 1753  Weight: 79.4 kg    Examination:  General exam: Appears calm and comfortable  Respiratory system: unlabored Cardiovascular system: RRR Gastrointestinal system: Abdomen is nondistended, soft and nontender. Central nervous system: pleasantly confused, doesn't say much, chronic L sided weakness Extremities: no LEE   Data Reviewed: I have personally reviewed following labs and imaging studies  CBC: Recent Labs  Lab 07/19/22 1504 07/19/22 1537 07/20/22 0335  WBC 8.4  --  8.0  NEUTROABS 5.3  --   --   HGB 13.2 13.3 12.5*  HCT 39.7 39.0 36.4*  MCV 90.4  --  88.6  PLT 208  --  497    Basic Metabolic Panel: Recent Labs   Lab 07/19/22 1504 07/19/22 1537 07/20/22 0335  NA 134* 133* 138  K 4.9 5.9* 4.2  CL 101  --  105  CO2 24  --  24  GLUCOSE 186*  --  105*  BUN 33*  --  25*  CREATININE 1.29*  --  1.10  CALCIUM 9.3  --  9.2    GFR: Estimated Creatinine Clearance: 55.1 mL/min (by C-G formula based on SCr of 1.1 mg/dL).  Liver Function Tests: Recent Labs  Lab 07/19/22 1504  AST 25  ALT 10  ALKPHOS 74  BILITOT 0.8  PROT 6.9  ALBUMIN 3.6    CBG: Recent Labs  Lab 07/19/22 1801 07/20/22 0240 07/20/22 0821  GLUCAP 82 93 98  Recent Results (from the past 240 hour(s))  Blood Culture (Routine X 2)     Status: None (Preliminary result)   Collection Time: 07/19/22  2:50 PM   Specimen: BLOOD  Result Value Ref Range Status   Specimen Description BLOOD SITE NOT SPECIFIED  Final   Special Requests   Final    BOTTLES DRAWN AEROBIC AND ANAEROBIC Blood Culture adequate volume   Culture   Final    NO GROWTH < 24 HOURS Performed at [facility], [address], [place], [place] [address]    Report Status PENDING  Incomplete  Blood Culture (Routine X 2)     Status: None (Preliminary result)   Collection Time: 07/19/22  3:00 PM   Specimen: BLOOD  Result Value Ref Range Status   Specimen Description BLOOD SITE NOT SPECIFIED  Final   Special Requests   Final    BOTTLES DRAWN AEROBIC AND ANAEROBIC Blood Culture results may not be optimal due to an inadequate volume of blood received in culture bottles   Culture   Final    NO GROWTH < 24 HOURS Performed at [facility], [place] [address] [place], [place] [address]    Report Status PENDING  Incomplete  Resp Panel by RT-PCR (Flu Padme Arriaga&B, Covid) Anterior Nasal Swab     Status: None   Collection Time: 07/19/22  3:36 PM   Specimen: Anterior Nasal Swab  Result Value Ref Range Status   SARS Coronavirus 2 by RT PCR NEGATIVE NEGATIVE Final    Comment: (NOTE) SARS-CoV-2 target nucleic acids are NOT DETECTED.  The SARS-CoV-2 RNA is  generally detectable in upper respiratory specimens during the acute phase of infection. The lowest concentration of SARS-CoV-2 viral copies this assay can detect is 138 copies/mL. Rifka Ramey negative result does not preclude SARS-Cov-2 infection and should not be used as the sole basis for treatment or other patient management decisions. Kayle Passarelli negative result may occur with  improper specimen collection/handling, submission of specimen other than nasopharyngeal swab, presence of viral mutation(s) within the areas targeted by this assay, and inadequate number of viral copies(<138 copies/mL). Libbi Towner negative result must be combined with clinical observations, patient history, and epidemiological information. The expected result is Negative.  Fact Sheet for Patients:  EntrepreneurPulse.com.au  Fact Sheet for Healthcare Providers:  IncredibleEmployment.be  This test is no t yet approved or cleared by the [place] FDA and  has been authorized for detection and/or diagnosis of SARS-CoV-2 by FDA under an Emergency Use Authorization (EUA). This EUA will remain  in effect (meaning this test can be used) for the duration of the COVID-19 declaration under Section 564(b)(1) of the Act, 21 U.S.C.section 360bbb-3(b)(1), unless the authorization is terminated  or revoked sooner.       Influenza Diangelo Radel by PCR NEGATIVE NEGATIVE Final   Influenza B by PCR NEGATIVE NEGATIVE Final    Comment: (NOTE) The Xpert Xpress SARS-CoV-2/FLU/RSV plus assay is intended as an aid in the diagnosis of influenza from Nasopharyngeal swab specimens and should not be used as Elwyn Klosinski sole basis for treatment. Nasal washings and aspirates are unacceptable for Xpert Xpress SARS-CoV-2/FLU/RSV testing.  Fact Sheet for Patients: EntrepreneurPulse.com.au  Fact Sheet for Healthcare Providers: IncredibleEmployment.be  This test is not yet approved or cleared by the [place] FDA and has been authorized for detection and/or diagnosis of SARS-CoV-2 by FDA under an Emergency Use Authorization (EUA). This EUA will remain in effect (meaning this test can be used) for the duration  of the COVID-19 declaration under Section 564(b)(1) of the Act, 21 U.S.C. section 360bbb-3(b)(1), unless the authorization is terminated or revoked.  Performed at [facility], [place] [address], [place], [place] [address]   Respiratory (~20 pathogens) panel by PCR     Status: None   Collection Time: 07/19/22  3:36 PM   Specimen: Nasopharyngeal Swab; Respiratory  Result Value Ref Range Status   Adenovirus NOT DETECTED NOT DETECTED Final   Coronavirus 229E NOT DETECTED NOT DETECTED Final    Comment: (NOTE) The Coronavirus on the Respiratory Panel, DOES NOT test for the novel  Coronavirus (2019 nCoV)    Coronavirus HKU1 NOT DETECTED NOT DETECTED Final   Coronavirus NL63 NOT DETECTED NOT DETECTED Final   Coronavirus OC43 NOT DETECTED NOT DETECTED Final   Metapneumovirus NOT DETECTED NOT DETECTED Final   Rhinovirus / Enterovirus NOT DETECTED NOT DETECTED Final   Influenza Manette Doto NOT DETECTED NOT DETECTED Final   Influenza B NOT DETECTED NOT DETECTED Final   Parainfluenza Virus 1 NOT DETECTED NOT DETECTED Final   Parainfluenza Virus 2 NOT DETECTED NOT DETECTED Final   Parainfluenza Virus 3 NOT DETECTED NOT DETECTED Final   Parainfluenza Virus 4 NOT DETECTED NOT DETECTED Final   Respiratory Syncytial Virus NOT DETECTED NOT DETECTED Final   Bordetella pertussis NOT DETECTED NOT DETECTED Final   Bordetella Parapertussis NOT DETECTED NOT DETECTED Final   Chlamydophila pneumoniae NOT DETECTED NOT DETECTED Final   Mycoplasma pneumoniae NOT DETECTED NOT DETECTED Final    Comment: Performed at [facility], [place]. [address], [place], [place] [address]         Radiology Studies: CT ANGIO HEAD NECK W WO CM  Result Date: 07/20/2022 CLINICAL DATA:  Stroke follow-up EXAM: CT  ANGIOGRAPHY HEAD AND NECK TECHNIQUE: Multidetector CT imaging of the head and neck was performed using the standard protocol during bolus administration of intravenous contrast. Multiplanar CT image reconstructions and MIPs were obtained to evaluate the vascular anatomy. Carotid stenosis measurements (when applicable) are obtained utilizing NASCET criteria, using the distal internal carotid diameter as the denominator. RADIATION DOSE REDUCTION: This exam was performed according to the departmental dose-optimization program which includes automated exposure control, adjustment of the mA and/or kV according to patient size and/or use of iterative reconstruction technique. CONTRAST:  108m OMNIPAQUE IOHEXOL 350 MG/ML SOLN COMPARISON:  MRI head 07/19/2022, CT head 07/19/2022 FINDINGS: Limitations: Assessment is slightly limited due to the degree of motion artifact. Within this limitation CT HEAD FINDINGS Brain: Redemonstrated severe chronic microvascular ischemic change and advanced generalized volume loss. Redemonstrated hypodensity in the left basal ganglia in the region of known infarction. Unchanged size and shape of the ventricular system. No evidence of intracranial hemorrhage. Vascular: See below for vascular findings. Skull: Normal. Negative for fracture or focal lesion. Sinuses/Orbits: Bilateral lens replacement.  Sinuses are clear. Other: None. Review of the MIP images confirms the above findings CTA NECK FINDINGS Aortic arch: Standard branching. Imaged portion shows no evidence of aneurysm or dissection. No significant stenosis of the major arch vessel origins. Right carotid system: There is scattered atherosclerotic plaque in the right common carotid artery. There is severe greater than 75% stenosis at the origin of the right internal carotid artery (series 15, image 212) Left carotid system: There is scattered atherosclerotic plaque in the left common carotid artery. There is severe (approximate 90% stenosis  at the origin of the left internal carotid artery (series 15, image 217). Vertebral arteries: There is mild stenosis of  the origin of the right vertebral artery. There is mild stenosis in the distal V2 segment of the left vertebral artery (series 15, image 149) Skeleton: Negative. There is likely at least moderate spinal canal stenosis at C4-C5. There is Genoa Freyre degenerative pannus at C1-C2. Other neck: Negative. Upper chest: There is severe centrilobular emphysema. Review of the MIP images confirms the above findings CTA HEAD FINDINGS Anterior circulation: There is near occlusion of the proximal M1 segment of the left MCA (series 15, image 90). The distal M1 and M2 segments are contrast opacified. Assessment of the A2 and A3 segments of the ACA is limited due to motion artifact. Posterior circulation: Assessment of the P2 and P3 segments of the bilateral PCAs is limited due to motion artifact. No significant stenosis, proximal occlusion, aneurysm, or vascular malformation. Venous sinuses: As permitted by contrast timing, patent. Anatomic variants: None Review of the MIP images confirms the above findings IMPRESSION: 1. Near occlusion of the proximal M1 segment of the left MCA with reconstitution of the distal M1 and M2 segments. 2. Severe stenosis at the origin of bilateral internal carotid arteries (greater than 75% on the right and approximately 90% on the left). 3. Redemonstrated hypodensities in the left basal ganglia, in keeping with patient's history of left basal ganglia infarct. No evidence of intracranial hemorrhage. 4. There is atherosclerotic plaque in the left subclavian artery resulting in moderate stenosis, proximal to the takeoff of the left vertebral artery. Electronically Signed   By: Marin Stephen M.D.   On: 07/20/2022 09:22   MR BRAIN WO CONTRAST  Result Date: 07/19/2022 CLINICAL DATA:  Initial evaluation for neuro deficit, stroke suspected. EXAM: MRI HEAD WITHOUT CONTRAST TECHNIQUE: Multiplanar,  multiecho pulse sequences of the brain and surrounding structures were obtained without intravenous contrast. COMPARISON:  Prior CT from earlier the same day as well as earlier studies. FINDINGS: Brain: Examination degraded by motion artifact. Generalized age-related cerebral atrophy. Patchy and confluent T2/FLAIR hyperintensity involving the periventricular deep white matter both cerebral hemispheres as well as the pons, most consistent with chronic small vessel ischemic disease. Few scatter remote lacunar infarcts noted about the deep gray nuclei. 2.3 cm acute ischemic nonhemorrhagic infarcts seen involving the left basal ganglia (series 5, image 83). No other evidence for acute or subacute ischemia. Gray-white matter differentiation otherwise maintained. No acute or chronic intracranial blood products. No mass lesion, midline shift or mass effect. Diffuse ventricular prominence related to global parenchymal volume loss without hydrocephalus. No extra-axial fluid collection. Pituitary gland and suprasellar region within normal limits. Vascular: Major intracranial vascular flow voids are maintained. Skull and upper cervical spine: Craniocervical junction within normal limits. Bone marrow signal intensity normal. No scalp soft tissue abnormality. Sinuses/Orbits: Patient status post bilateral ocular lens replacement. Scattered mucosal thickening noted about the paranasal sinuses. No significant mastoid effusion. Other: None. IMPRESSION: 1. 2.3 cm acute ischemic nonhemorrhagic left basal ganglia infarct. 2. Underlying age-related cerebral atrophy with mild chronic small vessel ischemic disease. Electronically Signed   By: Jeannine Boga M.D.   On: 07/19/2022 23:54   CT HEAD WO CONTRAST  Result Date: 07/19/2022 CLINICAL DATA:  Mental status change, persistent or worsening EXAM: CT HEAD WITHOUT CONTRAST TECHNIQUE: Contiguous axial images were obtained from the base of the skull through the vertex without  intravenous contrast. RADIATION DOSE REDUCTION: This exam was performed according to the departmental dose-optimization program which includes automated exposure control, adjustment of the mA and/or kV according to patient size and/or use of iterative reconstruction technique.  COMPARISON:  CT head 07/09/2021. FINDINGS: Motion limited study. Brain: No evidence of acute large vascular territory infarct, acute hemorrhage, mass lesion, midline shift or hydrocephalus. Cerebral atrophy with ex vacuo ventricular dilation. Confluent and patchy hypodensities in the white matter, nonspecific but likely the sequela of chronic microvascular ischemic disease. Vascular: No hyperdense vessel identified. Calcific intracranial atherosclerosis. Skull: No acute fracture. Sinuses/Orbits: Largely clear sinuses.  No acute orbital findings. Other: No mastoid effusions. IMPRESSION: 1. No evidence of acute intracranial abnormality. 2. Similar chronic microvascular ischemic disease and cerebral atrophy (ICD10-G31.9). Electronically Signed   By: Margaretha Sheffield M.D.   On: 07/19/2022 15:52   DG Chest Port 1 View  Result Date: 07/19/2022 CLINICAL DATA:  Altered mental status. EXAM: PORTABLE CHEST 1 VIEW COMPARISON:  December 05, 2021. FINDINGS: Stable cardiomediastinal silhouette. Status post coronary bypass graft. Mild bibasilar subsegmental atelectasis or infiltrates are noted. Bony thorax is unremarkable. IMPRESSION: Mild bibasilar subsegmental atelectasis or infiltrates. Electronically Signed   By: Marijo Conception M.D.   On: 07/19/2022 14:38        Scheduled Meds:  [START ON 07/21/2022]  stroke: early stages of recovery book   Does not apply Once   aspirin  300 mg Rectal Daily   Or   aspirin  325 mg Oral Daily   clopidogrel  75 mg Oral Q supper   enoxaparin (LOVENOX) injection  40 mg Subcutaneous Q24H   insulin aspart  0-9 Units Subcutaneous TID WC   pantoprazole  40 mg Oral QAC supper   Continuous Infusions:   azithromycin (ZITHROMAX) 500 mg in sodium chloride 0.9 % 250 mL IVPB     cefTRIAXone (ROCEPHIN)  IV       LOS: 0 days    Time spent: over 30 min    Fayrene Helper, MD [facility]   To contact the attending provider between 7A-7P or the covering provider during after hours 7P-7A, please log into the web site [email or web address] and access using universal [place] password for that web site. If you do not have the password, please call the hospital operator.  07/20/2022, 10:15 AM

## 2022-07-20 NOTE — Consult Note (Signed)
NEURO HOSPITALIST CONSULT NOTE   Requesting physician: Dr. Florene Glen  Reason for Consult: Onset of facial droop and generalized weakness on Saturday.   History obtained from:  Wife, Caretaker and Chart     HPI:                                                                                                                                          [name] is an 87 y.o. male with a PMHx of CAD s/p CABG, DM, cataract, GERD, nephrolithiasis, HLD, HTN and PAD s/p stents and femoral endarterectomy to BLE who presented to the ED yesterday afternoon for assessment of AMS x 2 days. Per Triage RN "Pt is not talking seems to be understanding but will not speak. When EMS attached pt to the monitor he was in a-fib per wife he has no hx of a-fib. Pt received 500 NS bolus with EMS. Pt answering questions for this nurse."  Wife provided additional history to EDP: "Typically does not speak much but has been speaking much less and typically ambulates with a walker but has not been able to for about 48 hours.  Has been coughing a bit for the past 48 hours or so."  CXR showed possible bilateral lower lobe pneumonia. No leukocytosis on CBC and lactate was normal.   MRI was obtained yesterday night, revealing a 2.3 cm acute ischemic nonhemorrhagic left basal ganglia infarct.  Past Medical History:  Diagnosis Date   Arthritis    CAD (coronary artery disease)    Cataract    Diabetes mellitus    GERD (gastroesophageal reflux disease)    Helicobacter pylori gastritis 01/2006, 03/2011   EGD - Pylera Tx 2010   Hiccups    History of kidney stones    Hyperlipidemia    Hypertension    PAD (peripheral artery disease) [facility])     Past Surgical History:  Procedure Laterality Date   CATARACT EXTRACTION     right   COLONOSCOPY  2002, 2007, 06/09/2011   Dr. Evelina Bucy - [name], [place]: for FHx colon cancer, no adenomas; 2012: [facility] - normal   CORONARY ARTERY BYPASS GRAFT  05-07-2008    ENDARTERECTOMY FEMORAL Bilateral 03/23/2015   Procedure: ENDARTERECTOMY OF BILATERAL COMMON FEMORAL AND PROFUNDA ARTERIES;  Surgeon: Rosetta Posner, MD;  Location: [place];  Service: Vascular;  Laterality: Bilateral;   ESOPHAGOGASTRODUODENOSCOPY  02/20/06   Dr. Evelina Bucy, [name], [place]. pylori gastritis and GERD changes (pathology)   EYE SURGERY Left    cataract   HERNIA REPAIR  12/24/2010   INSERTION OF ILIAC STENT Bilateral 03/23/2015   Procedure: LEFT EXTERNAL ILIAC AND BILATERAL COMMON ILIAC STENT;  Surgeon: Rosetta Posner, MD;  Location: [facility] OR;  Service: Vascular;  Laterality: Bilateral;   JOINT REPLACEMENT  right knee replacement   PATCH ANGIOPLASTY Bilateral 03/23/2015   Procedure: PATCH ANGIOPLASTY OF BILATERAL COMMON FEMORAL ARTERIES;  Surgeon: Rosetta Posner, MD;  Location: [place];  Service: Vascular;  Laterality: Bilateral;   PERIPHERAL VASCULAR CATHETERIZATION N/A 03/11/2015   Procedure: Abdominal Aortogram w/Lower Extremity;  Surgeon: Rosetta Posner, MD;  Location: [place] [facility];  Service: Cardiovascular;  Laterality: N/A;   TOTAL KNEE ARTHROPLASTY  07-13-2012   right   UPPER GASTROINTESTINAL ENDOSCOPY  03/09/2009   Dr. Carlean Purl: H. pylori gastritis (Pylera Tx) and 2 cm hiatal hernia    Family History  Problem Relation Age of Onset   Heart disease Father    Diabetes Father    Heart attack Father    Hypertension Father    Varicose Veins Father    Cancer Sister        colon   Diabetes Sister    Other Sister        varicose veins   Heart attack Sister    Heart disease Sister    Hyperlipidemia Sister    Hypertension Sister    Varicose Veins Sister    Cancer Brother        prostate   Diabetes Brother    Heart disease Brother    Hyperlipidemia Brother    Hypertension Brother    Other Brother        varicose veins   Heart attack Brother    Varicose Veins Brother    Cancer Sister        lukemia   Myasthenia gravis Sister    Colon cancer Sister    Leukemia Sister     Heart attack Brother    Crohn's disease Son              Social History:  reports that he has quit smoking. His smoking use included cigarettes. He has a 40.00 pack-year smoking history. He has never used smokeless tobacco. He reports current alcohol use. He reports that he does not use drugs.  Allergies  Allergen Reactions   Atorvastatin Other (See Comments)    Muscle pain   Rosuvastatin Other (See Comments)    Muscle pain   Simvastatin Other (See Comments)    Muscle pain   Sulfonamide Derivatives Hives    HOME MEDICATIONS:                                                                                                                      No current facility-administered medications on file prior to encounter.   Current Outpatient Medications on File Prior to Encounter  Medication Sig Dispense Refill   acetaminophen (TYLENOL) 500 MG tablet Take 500 mg by mouth every 6 (six) hours as needed for moderate pain.     cholecalciferol (VITAMIN D3) 25 MCG (1000 UNIT) tablet Take 1,000 Units by mouth daily.     clopidogrel (PLAVIX) 75 MG tablet Take 75 mg by mouth daily with supper.     magnesium oxide (MAG-OX) 400 (  240 Mg) MG tablet Take 1 tablet (400 mg total) by mouth daily.     metFORMIN (GLUCOPHAGE) 1000 MG tablet Take 1 tablet (1,000 mg total) by mouth daily with supper. (Patient taking differently: Take 1,500 mg by mouth daily with supper.)     metoprolol succinate (TOPROL-XL) 25 MG 24 hr tablet Take 25 mg by mouth daily with supper.     pantoprazole (PROTONIX) 40 MG tablet Take 40 mg by mouth daily before supper.     ramipril (ALTACE) 5 MG capsule Take 5 mg by mouth daily.     glucose blood (ONETOUCH VERIO) test strip Check sugars daily Dx: E11.49     Misc. Devices [facility]) MISC Dx: Muscle Weakness     OneTouch Delica Lancets 63O MISC Pt to check BS daily  Dx: E11.49     polyethylene glycol (MIRALAX / GLYCOLAX) 17 g packet Take 17 g by mouth daily as needed for mild  constipation. (Patient not taking: Reported on 07/19/2022) 14 each 0     ROS:                                                                                                                                       The patient is unable to provide a detailed ROS due to cognitive/communication deficit. He does not appear to be in any pain.    Blood pressure (!) 144/64, pulse (!) 54, temperature (!) 97.5 F (36.4 C), temperature source Oral, resp. rate 14, height '6\' 2"'$  (1.88 m), weight 79.4 kg, SpO2 99 %.   General Examination:                                                                                                       Physical Exam  HEENT-  [place]/AT    Lungs- Respirations unlabored Extremities- No edema   Neurological Examination Mental Status: Awake and alert. Poor orientation to time and place. Pleasant and cooperative. Speech is sparse but fluent. He is able to name common objects but has difficulty with less common words. Comprehension intact for all commands.  Cranial Nerves: II: Temporal visual fields intact with no extinction to DSS. PERRL  III,IV, VI: No ptosis. EOMI with saccadic quality of visual pursuits.  V: Temp sensation intact bilaterally VII: Smile symmetric VIII: Hearing intact to voice IX,X: No hypophonia or hoarseness XI: Symmetric  XII: Midline tongue extension Motor: RUE 4/5 RLE 4+/5 LUE and LLE 5/5 Movements are slow Sensory: Intact to temp and FT x  4. Unreliable responses when testing for extinction.  Deep Tendon Reflexes: 2+ and symmetric brachioradialis and patellar reflexes bilaterally Plantars: Right: downgoing   Left: downgoing Cerebellar: Dysmetric RUE on assessment of right FNF.  Left FNF with tremor but no ataxia. Mild bradykinesia noted bilaterally  Gait: Deferred   Lab Results: Basic Metabolic Panel: Recent Labs  Lab 07/19/22 1504 07/19/22 1537 07/20/22 0335  NA 134* 133* 138  K 4.9 5.9* 4.2  CL 101  --  105  CO2 24  --  24   GLUCOSE 186*  --  105*  BUN 33*  --  25*  CREATININE 1.29*  --  1.10  CALCIUM 9.3  --  9.2    CBC: Recent Labs  Lab 07/19/22 1504 07/19/22 1537 07/20/22 0335  WBC 8.4  --  8.0  NEUTROABS 5.3  --   --   HGB 13.2 13.3 12.5*  HCT 39.7 39.0 36.4*  MCV 90.4  --  88.6  PLT 208  --  171    Cardiac Enzymes: No results for input(s): "CKTOTAL", "CKMB", "CKMBINDEX", "TROPONINI" in the last 168 hours.  Lipid Panel: No results for input(s): "CHOL", "TRIG", "HDL", "CHOLHDL", "VLDL", "LDLCALC" in the last 168 hours.  Imaging: CT ANGIO HEAD NECK W WO CM  Result Date: 07/20/2022 CLINICAL DATA:  Stroke follow-up EXAM: CT ANGIOGRAPHY HEAD AND NECK TECHNIQUE: Multidetector CT imaging of the head and neck was performed using the standard protocol during bolus administration of intravenous contrast. Multiplanar CT image reconstructions and MIPs were obtained to evaluate the vascular anatomy. Carotid stenosis measurements (when applicable) are obtained utilizing NASCET criteria, using the distal internal carotid diameter as the denominator. RADIATION DOSE REDUCTION: This exam was performed according to the departmental dose-optimization program which includes automated exposure control, adjustment of the mA and/or kV according to patient size and/or use of iterative reconstruction technique. CONTRAST:  31m OMNIPAQUE IOHEXOL 350 MG/ML SOLN COMPARISON:  MRI head 07/19/2022, CT head 07/19/2022 FINDINGS: Limitations: Assessment is slightly limited due to the degree of motion artifact. Within this limitation CT HEAD FINDINGS Brain: Redemonstrated severe chronic microvascular ischemic change and advanced generalized volume loss. Redemonstrated hypodensity in the left basal ganglia in the region of known infarction. Unchanged size and shape of the ventricular system. No evidence of intracranial hemorrhage. Vascular: See below for vascular findings. Skull: Normal. Negative for fracture or focal lesion.  Sinuses/Orbits: Bilateral lens replacement.  Sinuses are clear. Other: None. Review of the MIP images confirms the above findings CTA NECK FINDINGS Aortic arch: Standard branching. Imaged portion shows no evidence of aneurysm or dissection. No significant stenosis of the major arch vessel origins. Right carotid system: There is scattered atherosclerotic plaque in the right common carotid artery. There is severe greater than 75% stenosis at the origin of the right internal carotid artery (series 15, image 212) Left carotid system: There is scattered atherosclerotic plaque in the left common carotid artery. There is severe (approximate 90% stenosis at the origin of the left internal carotid artery (series 15, image 217). Vertebral arteries: There is mild stenosis of the origin of the right vertebral artery. There is mild stenosis in the distal V2 segment of the left vertebral artery (series 15, image 149) Skeleton: Negative. There is likely at least moderate spinal canal stenosis at C4-C5. There is a degenerative pannus at C1-C2. Other neck: Negative. Upper chest: There is severe centrilobular emphysema. Review of the MIP images confirms the above findings CTA HEAD FINDINGS Anterior circulation: There is near occlusion of  the proximal M1 segment of the left MCA (series 15, image 90). The distal M1 and M2 segments are contrast opacified. Assessment of the A2 and A3 segments of the ACA is limited due to motion artifact. Posterior circulation: Assessment of the P2 and P3 segments of the bilateral PCAs is limited due to motion artifact. No significant stenosis, proximal occlusion, aneurysm, or vascular malformation. Venous sinuses: As permitted by contrast timing, patent. Anatomic variants: None Review of the MIP images confirms the above findings IMPRESSION: 1. Near occlusion of the proximal M1 segment of the left MCA with reconstitution of the distal M1 and M2 segments. 2. Severe stenosis at the origin of bilateral  internal carotid arteries (greater than 75% on the right and approximately 90% on the left). 3. Redemonstrated hypodensities in the left basal ganglia, in keeping with patient's history of left basal ganglia infarct. No evidence of intracranial hemorrhage. 4. There is atherosclerotic plaque in the left subclavian artery resulting in moderate stenosis, proximal to the takeoff of the left vertebral artery. Electronically Signed   By: Marin Roberts M.D.   On: 07/20/2022 09:22   MR BRAIN WO CONTRAST  Result Date: 07/19/2022 CLINICAL DATA:  Initial evaluation for neuro deficit, stroke suspected. EXAM: MRI HEAD WITHOUT CONTRAST TECHNIQUE: Multiplanar, multiecho pulse sequences of the brain and surrounding structures were obtained without intravenous contrast. COMPARISON:  Prior CT from earlier the same day as well as earlier studies. FINDINGS: Brain: Examination degraded by motion artifact. Generalized age-related cerebral atrophy. Patchy and confluent T2/FLAIR hyperintensity involving the periventricular deep white matter both cerebral hemispheres as well as the pons, most consistent with chronic small vessel ischemic disease. Few scatter remote lacunar infarcts noted about the deep gray nuclei. 2.3 cm acute ischemic nonhemorrhagic infarcts seen involving the left basal ganglia (series 5, image 83). No other evidence for acute or subacute ischemia. Gray-white matter differentiation otherwise maintained. No acute or chronic intracranial blood products. No mass lesion, midline shift or mass effect. Diffuse ventricular prominence related to global parenchymal volume loss without hydrocephalus. No extra-axial fluid collection. Pituitary gland and suprasellar region within normal limits. Vascular: Major intracranial vascular flow voids are maintained. Skull and upper cervical spine: Craniocervical junction within normal limits. Bone marrow signal intensity normal. No scalp soft tissue abnormality. Sinuses/Orbits: Patient  status post bilateral ocular lens replacement. Scattered mucosal thickening noted about the paranasal sinuses. No significant mastoid effusion. Other: None. IMPRESSION: 1. 2.3 cm acute ischemic nonhemorrhagic left basal ganglia infarct. 2. Underlying age-related cerebral atrophy with mild chronic small vessel ischemic disease. Electronically Signed   By: Jeannine Boga M.D.   On: 07/19/2022 23:54   CT HEAD WO CONTRAST  Result Date: 07/19/2022 CLINICAL DATA:  Mental status change, persistent or worsening EXAM: CT HEAD WITHOUT CONTRAST TECHNIQUE: Contiguous axial images were obtained from the base of the skull through the vertex without intravenous contrast. RADIATION DOSE REDUCTION: This exam was performed according to the departmental dose-optimization program which includes automated exposure control, adjustment of the mA and/or kV according to patient size and/or use of iterative reconstruction technique. COMPARISON:  CT head 07/09/2021. FINDINGS: Motion limited study. Brain: No evidence of acute large vascular territory infarct, acute hemorrhage, mass lesion, midline shift or hydrocephalus. Cerebral atrophy with ex vacuo ventricular dilation. Confluent and patchy hypodensities in the white matter, nonspecific but likely the sequela of chronic microvascular ischemic disease. Vascular: No hyperdense vessel identified. Calcific intracranial atherosclerosis. Skull: No acute fracture. Sinuses/Orbits: Largely clear sinuses.  No acute orbital findings. Other: No  mastoid effusions. IMPRESSION: 1. No evidence of acute intracranial abnormality. 2. Similar chronic microvascular ischemic disease and cerebral atrophy (ICD10-G31.9). Electronically Signed   By: Margaretha Sheffield M.D.   On: 07/19/2022 15:52   DG Chest Port 1 View  Result Date: 07/19/2022 CLINICAL DATA:  Altered mental status. EXAM: PORTABLE CHEST 1 VIEW COMPARISON:  December 05, 2021. FINDINGS: Stable cardiomediastinal silhouette. Status post  coronary bypass graft. Mild bibasilar subsegmental atelectasis or infiltrates are noted. Bony thorax is unremarkable. IMPRESSION: Mild bibasilar subsegmental atelectasis or infiltrates. Electronically Signed   By: Marijo Conception M.D.   On: 07/19/2022 14:38     Assessment: 87 year old male with onset of facial droop and generalized weakness on Saturday.  - Exam reveals sparse speech, poor orientation, mild right sided weakness and right sided dysmetria.  - MRI brain:  2.3 cm acute ischemic nonhemorrhagic left basal ganglia infarct. Underlying age-related cerebral atrophy with mild chronic small vessel ischemic disease - CT of brain: No evidence of acute intracranial abnormality. Similar to prior study is evidence for chronic microvascular ischemic disease and cerebral atrophy - CTA of head and neck: Near occlusion of the proximal M1 segment of the left MCA with reconstitution of the distal M1 and M2 segments. Severe stenosis at the origin of bilateral internal carotid arteries (greater than 75% on the right and approximately 90% on the left). Redemonstrated hypodensities in the left basal ganglia, in keeping with patient's history of left basal ganglia infarct. No evidence of intracranial hemorrhage. There is atherosclerotic plaque in the left subclavian artery resulting in moderate stenosis, proximal to the takeoff of the left vertebral artery. - Possible new onset of atrial fibrillation. If confirmed by Cardiology, this would be the most likely etiology for the patient's subacute stroke - Stroke risk factors: Possible new onset atrial fibrillation, CAD, DM, HLD, HTN and PAD   Recommendations: - Cardiology consult regarding possible new onset atrial fibrillation. If Cardiology agrees, he should be started on oral anticoagulation, provided that he is not at high risk for falls. Most likely will need to continue Plavix due to PAD with stents. - HgbA1c, fasting lipid panel - TTE - PT consult, OT  consult, Speech consult - Add ASA to home Plavix for DAPT. Indication is for secondary stroke prevention. Has failed Plavix monotherapy - Risk factor modification - Telemetry monitoring - Frequent neuro checks - NPO until passes stroke swallow screen    Electronically signed: Dr. Kerney Elbe 07/20/2022, 12:45 PM

## 2022-07-20 NOTE — Evaluation (Signed)
Clinical/Bedside Swallow Evaluation Patient Details  Name: [name] MRN: [id number] Date of Birth: [birth year]  Today's Date: 07/20/2022 Time: SLP Start Time (ACUTE ONLY): 99 SLP Stop Time (ACUTE ONLY): 1300 SLP Time Calculation (min) (ACUTE ONLY): 40 min  Past Medical History:  Past Medical History:  Diagnosis Date   Arthritis    CAD (coronary artery disease)    Cataract    Diabetes mellitus    GERD (gastroesophageal reflux disease)    Helicobacter pylori gastritis 01/2006, 03/2011   EGD - Pylera Tx 2010   Hiccups    History of kidney stones    Hyperlipidemia    Hypertension    PAD (peripheral artery disease) ([place])    Past Surgical History:  Past Surgical History:  Procedure Laterality Date   CATARACT EXTRACTION     right   COLONOSCOPY  2002, 2007, 06/09/2011   Dr. Evelina Bucy - [name], [place]: for FHx colon cancer, no adenomas; 2012: [facility] - normal   CORONARY ARTERY BYPASS GRAFT  05-07-2008   ENDARTERECTOMY FEMORAL Bilateral 03/23/2015   Procedure: ENDARTERECTOMY OF BILATERAL COMMON FEMORAL AND PROFUNDA ARTERIES;  Surgeon: Rosetta Posner, MD;  Location: [facility];  Service: Vascular;  Laterality: Bilateral;   ESOPHAGOGASTRODUODENOSCOPY  02/20/06   Dr. Evelina Bucy, [name], [place]. pylori gastritis and GERD changes (pathology)   EYE SURGERY Left    cataract   HERNIA REPAIR  12/24/2010   INSERTION OF ILIAC STENT Bilateral 03/23/2015   Procedure: LEFT EXTERNAL ILIAC AND BILATERAL COMMON ILIAC STENT;  Surgeon: Rosetta Posner, MD;  Location: [facility] OR;  Service: Vascular;  Laterality: Bilateral;   JOINT REPLACEMENT     right knee replacement   PATCH ANGIOPLASTY Bilateral 03/23/2015   Procedure: PATCH ANGIOPLASTY OF BILATERAL COMMON FEMORAL ARTERIES;  Surgeon: Rosetta Posner, MD;  Location: [facility];  Service: Vascular;  Laterality: Bilateral;   PERIPHERAL VASCULAR CATHETERIZATION N/A 03/11/2015   Procedure: Abdominal Aortogram w/Lower Extremity;  Surgeon: Rosetta Posner, MD;  Location: [facility];  Service: Cardiovascular;  Laterality: N/A;   TOTAL KNEE ARTHROPLASTY  07-13-2012   right   UPPER GASTROINTESTINAL ENDOSCOPY  03/09/2009   Dr. Carlean Purl: H. pylori gastritis (Pylera Tx) and 2 cm hiatal hernia   HPI:  [name] is a 87 y.o. male with medical history significant of CAD, T2DM, GERD, HLD, HTN, PAD, hx of CVA who presented to ED with change in mental status. At baseline he does talk, but very little. He walks around the house with walker. He needs full assist x 2 for showering. He needs help with dressing. He can feed himself. No falls in the last year.  He has not had any fever/chills. No open sores or bed sores. He has been coughing for 2 days that is dry,but at times is clear phlegm. No odor to urine, unsure of color change. He has been eating and drinking well. Pulmonary infiltratesBeing treated for possible pneumonia. MRI shows left basal ganglia CVA. Pt had MBS in July 20223 that showed esopahgeal stasis, flash penetration, oral holding.    Assessment / Plan / Recommendation  Clinical Impression  Pt demonstrates swallowing ability consistent with baseline per prior MBS and family report. Pt has had an aspiration pneumonitis in the past from aspiration of vomit/reflux during the night, but otherwise has only been observed to orally hold boluses secondary to dementia and sometimes he overloads his mouth. Family gives supervision with meals and cuts up foods in advance. Today pt demonstrated both of these  behaviors; pt had one big cough while chewing a graham cracker with a fully loaded mouth. SLP provided pacing with next trial and pt did well. Recommend Dys 3 mechanical soft diet with thin liquids and supervision with meals. Reviewed reflux precautions as well. Wife and caregiver agree with plan. No SLP f/u needed for swallowing. SLP Visit Diagnosis: Dysphagia, oral phase (R13.11)    Aspiration Risk  Mild aspiration risk    Diet Recommendation Dysphagia 3 (Mech soft);Thin liquid    Liquid Administration via: Cup;Straw Medication Administration: Crushed with puree Supervision: Staff to assist with self feeding;Full supervision/cueing for compensatory strategies Compensations: Small sips/bites;Slow rate Postural Changes: Seated upright at 90 degrees    Other  Recommendations Oral Care Recommendations: Oral care BID    Recommendations for follow up therapy are one component of a multi-disciplinary discharge planning process, led by the attending physician.  Recommendations may be updated based on patient status, additional functional criteria and insurance authorization.  Follow up Recommendations No SLP follow up      Assistance Recommended at Discharge    Functional Status Assessment    Frequency and Duration            Prognosis        Swallow Study   General HPI: [name] is a 87 y.o. male with medical history significant of CAD, T2DM, GERD, HLD, HTN, PAD, hx of CVA who presented to ED with change in mental status. At baseline he does talk, but very little. He walks around the house with walker. He needs full assist x 2 for showering. He needs help with dressing. He can feed himself. No falls in the last year.  He has not had any fever/chills. No open sores or bed sores. He has been coughing for 2 days that is dry,but at times is clear phlegm. No odor to urine, unsure of color change. He has been eating and drinking well. Pulmonary infiltratesBeing treated for possible pneumonia. MRI shows left basal ganglia CVA. Pt had MBS in July 20223 that showed esopahgeal stasis, flash penetration, oral holding. Type of Study: Bedside Swallow Evaluation Previous Swallow Assessment: MBS 04/2022 - regular/thin Diet Prior to this Study: NPO Temperature Spikes Noted: No Respiratory Status: Room air History of Recent Intubation: No Behavior/Cognition: Alert;Cooperative;Pleasant mood Oral Cavity Assessment: Within Functional Limits Oral Care Completed by SLP: No Oral  Cavity - Dentition: Adequate natural dentition Vision: Functional for self-feeding Self-Feeding Abilities: Able to feed self Patient Positioning: Upright in bed Baseline Vocal Quality: Normal Volitional Cough: Strong Volitional Swallow: Able to elicit    Oral/Motor/Sensory Function Overall Oral Motor/Sensory Function: Within functional limits   Ice Chips     Thin Liquid Thin Liquid: Impaired Presentation: Cup;Straw Oral Phase Functional Implications: Prolonged oral transit;Oral holding    Nectar Thick Nectar Thick Liquid: Not tested   Honey Thick Honey Thick Liquid: Not tested   Puree Puree: Not tested   Solid     Solid: Impaired Presentation: Self Fed Oral Phase Functional Implications: Oral holding;Other (comment) (oral packing)      Nianna Igo, Katherene Ponto 07/20/2022,1:25 PM

## 2022-07-20 NOTE — Evaluation (Signed)
Occupational Therapy Evaluation Patient Details Name: [name] MRN: [id number] DOB: [birth year] Today's Date: 07/20/2022   History of Present Illness Pt is an 87 y/o male presenting with acute metabolic encephalopathy. PMH: CAD, DM, GERD, dementia, HTN, PAD , R TKA   Clinical Impression   PTA, pt lives with wife and has caregiver assistance 8 hours every day. Pt typically ambulatory with RW around the home and receives variable levels of assistance for ADLs. Pt presents now with deficits in strength, endurance and balance. Pt requires Max-Total A x 2 for bed mobility with heavy posterior lean with Mod-Max A to correct. Attempted standing at bedside though unable to complete d/t narrow BOS and pt pushing back (fearful of falling). Pt requires Max A for UB ADL and Total A for LB ADLs bed level at this time. Pt's wife and caregiver present, interested in SNF rehab to regain ability to transfer/mobilize at home.       Recommendations for follow up therapy are one component of a multi-disciplinary discharge planning process, led by the attending physician.  Recommendations may be updated based on patient status, additional functional criteria and insurance authorization.   Follow Up Recommendations  Skilled nursing-short term rehab (<3 hours/day)    Assistance Recommended at Discharge Frequent or constant Supervision/Assistance  Patient can return home with the following Two people to help with walking and/or transfers;Two people to help with bathing/dressing/bathroom    Functional Status Assessment  Patient has had a recent decline in their functional status and demonstrates the ability to make significant improvements in function in a reasonable and predictable amount of time.  Equipment Recommendations  None recommended by OT    Recommendations for Other Services       Precautions / Restrictions Precautions Precautions: Fall Restrictions Weight Bearing Restrictions: No       Mobility Bed Mobility Overal bed mobility: Needs Assistance Bed Mobility: Supine to Sit, Sit to Supine     Supine to sit: Max assist, +2 for physical assistance, +2 for safety/equipment, HOB elevated Sit to supine: Total assist, +2 for safety/equipment, +2 for physical assistance   General bed mobility comments: Pt able to assist some in bringing BLE to EOB but required assist to fully scoot hips, lift trunk with noted posterior lean. Total A x 2 to get back on stretcher    Transfers Overall transfer level: Needs assistance Equipment used: Rolling walker (2 wheels) Transfers: Sit to/from Stand             General transfer comment: Trialed standing with RW though very narrow BOS and pushing back when attempting to stand. Rather than upward momentum, pt with forward momentum increasing fall risk      Balance Overall balance assessment: Needs assistance Sitting-balance support: No upper extremity supported, Feet supported Sitting balance-Leahy Scale: Poor Sitting balance - Comments: Variable Mod-Max A for sitting balance Postural control: Posterior lean                                 ADL either performed or assessed with clinical judgement   ADL Overall ADL's : Needs assistance/impaired Eating/Feeding: Sitting;Moderate assistance Eating/Feeding Details (indicate cue type and reason): per wife, she assisted him with breakfast this AM Grooming: Moderate assistance;Sitting   Upper Body Bathing: Maximal assistance;Sitting;Bed level   Lower Body Bathing: Total assistance;Bed level   Upper Body Dressing : Maximal assistance;Bed level;Sitting   Lower Body Dressing: Total assistance;Bed level  Toileting- Clothing Manipulation and Hygiene: Total assistance;Bed level         General ADL Comments: Pt with noted posterior lean sitting EOB and pushing back with standing attempts requiring extensive assist for ADLs     Vision Ability to See in Adequate  Light: 0 Adequate Patient Visual Report: No change from baseline Vision Assessment?: No apparent visual deficits     Perception     Praxis      Pertinent Vitals/Pain Pain Assessment Pain Assessment: No/denies pain     Hand Dominance Right   Extremity/Trunk Assessment Upper Extremity Assessment Upper Extremity Assessment: Generalized weakness   Lower Extremity Assessment Lower Extremity Assessment: Defer to PT evaluation   Cervical / Trunk Assessment Cervical / Trunk Assessment: Kyphotic   Communication Communication Communication: No difficulties   Cognition Arousal/Alertness: Awake/alert Behavior During Therapy: WFL for tasks assessed/performed, Flat affect Overall Cognitive Status: History of cognitive impairments - at baseline                                 General Comments: hx of dementia, follows one step directions fairly consistently. minimally verbal but does answer direct questions     General Comments  Wife and caregiver present    Exercises     Shoulder Instructions      Home Living Family/patient expects to be discharged to:: Private residence Living Arrangements: Spouse/significant other Available Help at Discharge: Personal care attendant;Family Type of Home: House Home Access: Ramped entrance     Home Layout: One level     Bathroom Shower/Tub: Occupational psychologist:  (Using bathroom in brief)     Home Equipment: IT sales professional (4 wheels);BSC/3in1;Shower seat - built in;Other (comment);Hospital bed (hoyer lift)   Additional Comments: Caregiver 8 hours, 7 days/week  Lives With: Spouse    Prior Functioning/Environment Prior Level of Function : Needs assist             Mobility Comments: Using Wheelchair the last few weeks. Has been needing assist for transfers and sometimes requires hoyer. Prior to a few days ago, pt ambulatory around the home with RW and only used w/c outside of the home ADLs  Comments: Wife and caregiver assists with ADLs.        OT Problem List: Decreased strength;Decreased activity tolerance;Impaired balance (sitting and/or standing);Decreased cognition      OT Treatment/Interventions: Self-care/ADL training;Therapeutic exercise;Energy conservation;DME and/or AE instruction;Therapeutic activities;Patient/family education;Balance training    OT Goals(Current goals can be found in the care plan section) Acute Rehab OT Goals Patient Stated Goal: family interested in rehab to improve mobility/transfers OT Goal Formulation: With patient/family Time For Goal Achievement: 08/03/22 Potential to Achieve Goals: Fair  OT Frequency: Min 2X/week    Co-evaluation PT/OT/SLP Co-Evaluation/Treatment: Yes Reason for Co-Treatment: For patient/therapist safety;To address functional/ADL transfers   OT goals addressed during session: ADL's and self-care;Strengthening/ROM      AM-PAC OT "6 Clicks" Daily Activity     Outcome Measure Help from another person eating meals?: A Lot Help from another person taking care of personal grooming?: A Lot Help from another person toileting, which includes using toliet, bedpan, or urinal?: Total Help from another person bathing (including washing, rinsing, drying)?: A Lot Help from another person to put on and taking off regular upper body clothing?: A Lot Help from another person to put on and taking off regular lower body clothing?: Total 6 Click Score: 10  End of Session Equipment Utilized During Treatment: Gait belt;Rolling walker (2 wheels) Nurse Communication: Mobility status  Activity Tolerance: Patient tolerated treatment well Patient left: in bed;with call bell/phone within reach;with family/visitor present  OT Visit Diagnosis: Other abnormalities of gait and mobility (R26.89);Unsteadiness on feet (R26.81)                Time: 1344-1400 OT Time Calculation (min): 16 min Charges:  OT General Charges $OT Visit: 1  Visit OT Evaluation $OT Eval Moderate Complexity: 1 Mod  Malachy Chamber, OTR/L Acute Rehab Services Office: [phone]   Layla Maw 07/20/2022, 2:33 PM

## 2022-07-20 NOTE — ED Notes (Signed)
Patient's wife contacted and updated on patient's inpatient room status.

## 2022-07-21 DIAGNOSIS — I2510 Atherosclerotic heart disease of native coronary artery without angina pectoris: Secondary | ICD-10-CM

## 2022-07-21 DIAGNOSIS — I63512 Cerebral infarction due to unspecified occlusion or stenosis of left middle cerebral artery: Secondary | ICD-10-CM

## 2022-07-21 DIAGNOSIS — J189 Pneumonia, unspecified organism: Secondary | ICD-10-CM | POA: Diagnosis not present

## 2022-07-21 DIAGNOSIS — E1122 Type 2 diabetes mellitus with diabetic chronic kidney disease: Secondary | ICD-10-CM

## 2022-07-21 DIAGNOSIS — I10 Essential (primary) hypertension: Secondary | ICD-10-CM | POA: Diagnosis not present

## 2022-07-21 DIAGNOSIS — N1831 Chronic kidney disease, stage 3a: Secondary | ICD-10-CM | POA: Diagnosis not present

## 2022-07-21 DIAGNOSIS — G9340 Encephalopathy, unspecified: Secondary | ICD-10-CM | POA: Diagnosis not present

## 2022-07-21 DIAGNOSIS — I6523 Occlusion and stenosis of bilateral carotid arteries: Secondary | ICD-10-CM

## 2022-07-21 DIAGNOSIS — F0390 Unspecified dementia without behavioral disturbance: Secondary | ICD-10-CM | POA: Diagnosis not present

## 2022-07-21 LAB — CBC WITH DIFFERENTIAL/PLATELET
Abs Immature Granulocytes: 0.03 10*3/uL (ref 0.00–0.07)
Basophils Absolute: 0.1 10*3/uL (ref 0.0–0.1)
Basophils Relative: 1 %
Eosinophils Absolute: 0.3 10*3/uL (ref 0.0–0.5)
Eosinophils Relative: 4 %
HCT: 34.2 % — ABNORMAL LOW (ref 39.0–52.0)
Hemoglobin: 12.1 g/dL — ABNORMAL LOW (ref 13.0–17.0)
Immature Granulocytes: 0 %
Lymphocytes Relative: 31 %
Lymphs Abs: 2.5 10*3/uL (ref 0.7–4.0)
MCH: 30.3 pg (ref 26.0–34.0)
MCHC: 35.4 g/dL (ref 30.0–36.0)
MCV: 85.7 fL (ref 80.0–100.0)
Monocytes Absolute: 0.8 10*3/uL (ref 0.1–1.0)
Monocytes Relative: 10 %
Neutro Abs: 4.3 10*3/uL (ref 1.7–7.7)
Neutrophils Relative %: 54 %
Platelets: 176 10*3/uL (ref 150–400)
RBC: 3.99 MIL/uL — ABNORMAL LOW (ref 4.22–5.81)
RDW: 12.8 % (ref 11.5–15.5)
WBC: 7.9 10*3/uL (ref 4.0–10.5)
nRBC: 0 % (ref 0.0–0.2)

## 2022-07-21 LAB — COMPREHENSIVE METABOLIC PANEL
ALT: 9 U/L (ref 0–44)
AST: 19 U/L (ref 15–41)
Albumin: 3.2 g/dL — ABNORMAL LOW (ref 3.5–5.0)
Alkaline Phosphatase: 68 U/L (ref 38–126)
Anion gap: 9 (ref 5–15)
BUN: 19 mg/dL (ref 8–23)
CO2: 21 mmol/L — ABNORMAL LOW (ref 22–32)
Calcium: 8.7 mg/dL — ABNORMAL LOW (ref 8.9–10.3)
Chloride: 104 mmol/L (ref 98–111)
Creatinine, Ser: 0.91 mg/dL (ref 0.61–1.24)
GFR, Estimated: >60 mL/min (ref >60)
Glucose, Bld: 111 mg/dL — ABNORMAL HIGH (ref 70–99)
Potassium: 3.6 mmol/L (ref 3.5–5.1)
Sodium: 134 mmol/L — ABNORMAL LOW (ref 135–145)
Total Bilirubin: 0.5 mg/dL (ref 0.3–1.2)
Total Protein: 6.1 g/dL — ABNORMAL LOW (ref 6.5–8.1)

## 2022-07-21 LAB — GLUCOSE, CAPILLARY
Glucose-Capillary: 132 mg/dL — ABNORMAL HIGH (ref 70–99)
Glucose-Capillary: 148 mg/dL — ABNORMAL HIGH (ref 70–99)
Glucose-Capillary: 97 mg/dL (ref 70–99)
Glucose-Capillary: 173 mg/dL — ABNORMAL HIGH (ref 70–99)

## 2022-07-21 LAB — LIPID PANEL
Cholesterol: 149 mg/dL (ref 0–200)
HDL: 31 mg/dL — ABNORMAL LOW (ref >40)
LDL Cholesterol: 104 mg/dL — ABNORMAL HIGH (ref 0–99)
Total CHOL/HDL Ratio: 4.8 RATIO
Triglycerides: 71 mg/dL (ref <150)
VLDL: 14 mg/dL (ref 0–40)

## 2022-07-21 LAB — PHOSPHORUS: Phosphorus: 2.8 mg/dL (ref 2.5–4.6)

## 2022-07-21 LAB — MAGNESIUM: Magnesium: 1.4 mg/dL — ABNORMAL LOW (ref 1.7–2.4)

## 2022-07-21 MED ORDER — EZETIMIBE 10 MG PO TABS
10.0000 mg | ORAL_TABLET | Freq: Every day | ORAL | Status: DC
  Administered 2022-07-22 – 2022-07-30 (×7): 10 mg via ORAL
  Filled 2022-07-21 (×8): qty 1

## 2022-07-21 MED ORDER — MAGNESIUM SULFATE 2 GM/50ML IV SOLN
2.0000 g | Freq: Once | INTRAVENOUS | Status: AC
  Administered 2022-07-21: 2 g via INTRAVENOUS
  Filled 2022-07-21: qty 50

## 2022-07-21 MED ORDER — VITAMIN B-12 1000 MCG PO TABS
1000.0000 ug | ORAL_TABLET | Freq: Every day | ORAL | Status: DC
  Administered 2022-07-21 – 2022-07-24 (×3): 1000 ug via ORAL
  Filled 2022-07-21 (×4): qty 1

## 2022-07-21 NOTE — Consult Note (Addendum)
[facility] NOTE   MRN : [id number]  Reason for Consult: symptomatic carotid stenosis Referring Physician:   History of Present Illness: [name] is a 87 y.o. male with medical history significant of CAD, T2DM, GERD, HLD, HTN, PAD, hx of CVA who presented to ED with change in mental status changes confusion.  His care giver noted left sided fcial droop, as well as left sided weakness.  He has stopped talking since he has been admitted per the family likely due to aphasia.    At baseline he ambulates with a walker around the house and is does verbally communicate.     Work up was initiated for stroke symptoms and MRI showed acute ischemic nonhemorrhagic left basal ganglia infarct.  We have been consulted after CTA demonstrated B ICA stenosis:  Severe stenosis at the origin of bilateral internal carotid arteries (greater than 75% on the right and approximately 90% on the left.    He is medically managed on ASA, Plavix, and  Zetia.  He has a n allergy to Statins causing muscle pain.      Current Facility-Administered Medications  Medication Dose Route Frequency Provider Last Rate Last Admin   acetaminophen (TYLENOL) tablet 650 mg  650 mg Oral Q6H PRN Orma Flaming, MD       Or   acetaminophen (TYLENOL) suppository 650 mg  650 mg Rectal Q6H PRN Orma Flaming, MD       aspirin suppository 300 mg  300 mg Rectal Daily Elodia Florence., MD       Or   aspirin tablet 325 mg  325 mg Oral Daily Elodia Florence., MD   325 mg at 07/20/22 0944   azithromycin (ZITHROMAX) 500 mg in sodium chloride 0.9 % 250 mL IVPB  500 mg Intravenous Q24H Orma Flaming, MD   Stopped at 07/20/22 1909   cefTRIAXone (ROCEPHIN) 2 g in sodium chloride 0.9 % 100 mL IVPB  2 g Intravenous Q24H Orma Flaming, MD   Stopped at 07/20/22 1901   clopidogrel (PLAVIX) tablet 75 mg  75 mg Oral Q supper Orma Flaming, MD   75 mg at 07/20/22 1808   enoxaparin (LOVENOX) injection 40  mg  40 mg Subcutaneous Q24H Orma Flaming, MD   40 mg at 07/20/22 1942   ezetimibe (ZETIA) tablet 10 mg  10 mg Oral Daily Rosalin Hawking, MD       insulin aspart (novoLOG) injection 0-9 Units  0-9 Units Subcutaneous TID WC Orma Flaming, MD   1 Units at 07/21/22 0729   pantoprazole (PROTONIX) EC tablet 40 mg  40 mg Oral QAC supper Orma Flaming, MD   40 mg at 07/20/22 1808    Pt meds include: Statin :No Betablocker: No ASA: Yes Other anticoagulants/antiplatelets: Plavix  Past Medical History:  Diagnosis Date   Arthritis    CAD (coronary artery disease)    Cataract    Diabetes mellitus    GERD (gastroesophageal reflux disease)    Helicobacter pylori gastritis 01/2006, 03/2011   EGD - Pylera Tx 2010   Hiccups    History of kidney stones    Hyperlipidemia    Hypertension    PAD (peripheral artery disease) [facility])     Past Surgical History:  Procedure Laterality Date   CATARACT EXTRACTION     right   COLONOSCOPY  2002, 2007, 06/09/2011   Dr. Evelina Bucy [name], [place]: for FHx colon cancer, no adenomas; 2012: Carlean Purl - normal  CORONARY ARTERY BYPASS GRAFT  05-07-2008   ENDARTERECTOMY FEMORAL Bilateral 03/23/2015   Procedure: ENDARTERECTOMY OF BILATERAL COMMON FEMORAL AND PROFUNDA ARTERIES;  Surgeon: Rosetta Posner, MD;  Location: [place];  Service: Vascular;  Laterality: Bilateral;   ESOPHAGOGASTRODUODENOSCOPY  02/20/06   Dr. Evelina Bucy, [name], [place]. pylori gastritis and GERD changes (pathology)   EYE SURGERY Left    cataract   HERNIA REPAIR  12/24/2010   INSERTION OF ILIAC STENT Bilateral 03/23/2015   Procedure: LEFT EXTERNAL ILIAC AND BILATERAL COMMON ILIAC STENT;  Surgeon: Rosetta Posner, MD;  Location: [facility] OR;  Service: Vascular;  Laterality: Bilateral;   JOINT REPLACEMENT     right knee replacement   PATCH ANGIOPLASTY Bilateral 03/23/2015   Procedure: PATCH ANGIOPLASTY OF BILATERAL COMMON FEMORAL ARTERIES;  Surgeon: Rosetta Posner, MD;  Location: [place];  Service: Vascular;  Laterality:  Bilateral;   PERIPHERAL VASCULAR CATHETERIZATION N/A 03/11/2015   Procedure: Abdominal Aortogram w/Lower Extremity;  Surgeon: Rosetta Posner, MD;  Location: [place] [facility];  Service: Cardiovascular;  Laterality: N/A;   TOTAL KNEE ARTHROPLASTY  07-13-2012   right   UPPER GASTROINTESTINAL ENDOSCOPY  03/09/2009   Dr. Carlean Purl: H. pylori gastritis (Pylera Tx) and 2 cm hiatal hernia    Social History Social History   Tobacco Use   Smoking status: Former    Packs/day: 1.00    Years: 40.00    Total pack years: 40.00    Types: Cigarettes   Smokeless tobacco: Never  Vaping Use   Vaping Use: Never used  Substance Use Topics   Alcohol use: Yes    Alcohol/week: 0.0 standard drinks of alcohol    Comment: 1 time per month   Drug use: No    Family History Family History  Problem Relation Age of Onset   Heart disease Father    Diabetes Father    Heart attack Father    Hypertension Father    Varicose Veins Father    Cancer Sister        colon   Diabetes Sister    Other Sister        varicose veins   Heart attack Sister    Heart disease Sister    Hyperlipidemia Sister    Hypertension Sister    Varicose Veins Sister    Cancer Brother        prostate   Diabetes Brother    Heart disease Brother    Hyperlipidemia Brother    Hypertension Brother    Other Brother        varicose veins   Heart attack Brother    Varicose Veins Brother    Cancer Sister        lukemia   Myasthenia gravis Sister    Colon cancer Sister    Leukemia Sister    Heart attack Brother    Crohn's disease Son     Allergies  Allergen Reactions   Atorvastatin Other (See Comments)    Muscle pain   Rosuvastatin Other (See Comments)    Muscle pain   Simvastatin Other (See Comments)    Muscle pain   Sulfonamide Derivatives Hives     REVIEW OF SYSTEMS  General: '[ ]'$  Weight loss, '[ ]'$  Fever, '[ ]'$  chills Neurologic: '[ ]'$  Dizziness, '[ ]'$  Blackouts, '[ ]'$  Seizure '[ ]'$  Stroke, '[ ]'$  "Mini stroke", '[ ]'$  Slurred  speech, '[ ]'$  Temporary blindness; '[ ]'$  weakness in arms or legs, '[ ]'$  Hoarseness '[ ]'$  Dysphagia Cardiac: '[ ]'$   Chest pain/pressure, '[ ]'$  Shortness of breath at rest '[ ]'$  Shortness of breath with exertion, '[ ]'$  Atrial fibrillation or irregular heartbeat  Vascular: '[ ]'$  Pain in legs with walking, '[ ]'$  Pain in legs at rest, '[ ]'$  Pain in legs at night,  '[ ]'$  Non-healing ulcer, '[ ]'$  Blood clot in vein/DVT,   Pulmonary: '[ ]'$  Home oxygen, '[ ]'$  Productive cough, '[ ]'$  Coughing up blood, '[ ]'$  Asthma,  '[ ]'$  Wheezing '[ ]'$  COPD Musculoskeletal:  '[ ]'$  Arthritis, '[ ]'$  Low back pain, '[ ]'$  Joint pain Hematologic: '[ ]'$  Easy Bruising, '[ ]'$  Anemia; '[ ]'$  Hepatitis Gastrointestinal: '[ ]'$  Blood in stool, '[ ]'$  Gastroesophageal Reflux/heartburn, Urinary: '[ ]'$  chronic Kidney disease, '[ ]'$  on HD - '[ ]'$  MWF or '[ ]'$  TTHS, '[ ]'$  Burning with urination, '[ ]'$  Difficulty urinating Skin: '[ ]'$  Rashes, '[ ]'$  Wounds Psychological: '[ ]'$  Anxiety, '[ ]'$  Depression  Physical Examination Vitals:   07/20/22 2200 07/20/22 2319 07/21/22 0015 07/21/22 0351  BP: (!) 153/60 (!) 152/105 (!) 153/92 (!) 152/85  Pulse: (!) 55 75  88  Resp: '16 18  17  '$ Temp: 97.8 F (36.6 C) 97.7 F (36.5 C)  97.6 F (36.4 C)  TempSrc: Axillary Axillary  Axillary  SpO2: 99% 99%  92%  Weight:      Height:       Body mass index is 22.47 kg/m.  General:  WDWN in NAD  HENT: WNL Eyes: Pupils equal Pulmonary: normal non-labored breathing , without Rales, rhonchi,  wheezing, frequent coughing  Cardiac: RRR, without  Murmurs, rubs or gallops; No carotid bruits Abdomen: soft, NT, no masses Skin: no rashes, ulcers noted;  no Gangrene , no cellulitis; no open wounds;   Vascular Exam/Pulses: moves B UE well with palpable radial pulses, femoral pulses, feet are warm and well perfused   Musculoskeletal: no muscle wasting or atrophy; no edema  Neurologic: A&O X 3; Appropriate Affect ;  SENSATION: normal; MOTOR FUNCTION: motor intact generally B LE he wiggles his toes, moves B UE  spontaneously Without Speech    Significant Diagnostic Studies: CBC Lab Results  Component Value Date   WBC 7.9 07/21/2022   HGB 12.1 (L) 07/21/2022   HCT 34.2 (L) 07/21/2022   MCV 85.7 07/21/2022   PLT 176 07/21/2022    BMET    Component Value Date/Time   NA 134 (L) 07/21/2022 0338   K 3.6 07/21/2022 0338   CL 104 07/21/2022 0338   CO2 21 (L) 07/21/2022 0338   GLUCOSE 111 (H) 07/21/2022 0338   BUN 19 07/21/2022 0338   CREATININE 0.91 07/21/2022 0338   CALCIUM 8.7 (L) 07/21/2022 0338   GFRNONAA >60 07/21/2022 0338   GFRAA >60 03/24/2015 0300   Estimated Creatinine Clearance: 66.7 mL/min (by C-G formula based on SCr of 0.91 mg/dL).  COAG Lab Results  Component Value Date   INR 1.13 03/20/2015   INR 2.1 (H) 07/16/2009   INR 1.9 (H) 07/15/2009     Non-Invasive Vascular Imaging:  CTA NECK FINDINGS   Aortic arch: Standard branching. Imaged portion shows no evidence of aneurysm or dissection. No significant stenosis of the major arch vessel origins.   Right carotid system: There is scattered atherosclerotic plaque in the right common carotid artery. There is severe greater than 75% stenosis at the origin of the right internal carotid artery (series 15, image 212)   Left carotid system: There is scattered atherosclerotic plaque in the left common carotid artery. There  is severe (approximate 90% stenosis at the origin of the left internal carotid artery (series 15, image 217).   Vertebral arteries: There is mild stenosis of the origin of the right vertebral artery. There is mild stenosis in the distal V2 segment of the left vertebral artery (series 15, image 149)   Skeleton: Negative. There is likely at least moderate spinal canal stenosis at C4-C5. There is a degenerative pannus at C1-C2.   Other neck: Negative.   Upper chest: There is severe centrilobular emphysema.   Review of the MIP images confirms the above findings   CTA HEAD FINDINGS   Anterior  circulation: There is near occlusion of the proximal M1 segment of the left MCA (series 15, image 90). The distal M1 and M2 segments are contrast opacified. Assessment of the A2 and A3 segments of the ACA is limited due to motion artifact.   Posterior circulation: Assessment of the P2 and P3 segments of the bilateral PCAs is limited due to motion artifact. No significant stenosis, proximal occlusion, aneurysm, or vascular malformation.   Venous sinuses: As permitted by contrast timing, patent.   Anatomic variants: None   Review of the MIP images confirms the above findings   IMPRESSION: 1. Near occlusion of the proximal M1 segment of the left MCA with reconstitution of the distal M1 and M2 segments. 2. Severe stenosis at the origin of bilateral internal carotid arteries (greater than 75% on the right and approximately 90% on the left). 3. Redemonstrated hypodensities in the left basal ganglia, in keeping with patient's history of left basal ganglia infarct. No evidence of intracranial hemorrhage. 4. There is atherosclerotic plaque in the left subclavian artery resulting in moderate stenosis, proximal to the takeoff of the left vertebral artery.     ASSESSMENT/PLAN:  87 y/o male with recent symptoms of speech loss and left sided weakness.  He was ambulatory prior and not is unable to ambulate as well.  He has a normal posture in the bed without lateral leaning.    MRI brain:  2.3 cm acute ischemic nonhemorrhagic left basal ganglia infarct. Underlying age-related cerebral atrophy with mild chronic small vessel ischemic disease  Symptomatic Carotid stenosis with right > 75% and left > 90% on CTA.   His symptoms don't coordinate with left sided weakness and aphasia.  He was unable to fully participate in the exam.  However he was eating breakfast with frequent coughing, but swallowed liquids without difficulty.  His stenosis is greater on the left.  Speech has evaluated him and  cleared him for regular diet.    He is pending rehab to assists with recovery.    The most obvious symptom is aphasia.  He may benefit from left CEA verse stent pending Dr. Anell Barr exam and review of studies.     [name] 07/21/2022 9:29 AM   I have seen and evaluated the patient. I agree with the PA note as documented above.  87 year old male that vascular surgery been consulted for carotid artery disease in the setting of left basal ganglia infarct.  Patient's wife provides most of the history but apparently he was in his normal state of health until Saturday when he had generalized weakness and needed a lot of additional assistance.  That is what prompted his presentation to [facility] with MRI showing left basal ganglia infarct.  Patient's wife states he had multiple smaller infarcts years ago that left him with some residual left-sided weakness.  He did not have any focal symptoms  that were new during this event but did have some aphasia.  He is fairly debilitated and can only walk with a walker with assistance and otherwise uses diapers in bed and does not even get to the bathroom.  I have reviewed his CTA neck that does show severe stenosis of bilateral proximal ICAs that appears greater than 80%.  I have also discussed his case with neurology who feels that his left brain stroke could be either from small vessel disease or carotid artery disease.  We will evaluate for possible left TCAR as I think a less invasive approach would be better for him.  Vascular will continue to follow.  Stephen Heck, MD [facility] [place] Office: [phone]

## 2022-07-21 NOTE — Progress Notes (Signed)
I have tentatively posted Mr. Szymborski for left TCAR on Monday.  I have spoken with Dr. Erlinda Hong as well.  Marty Heck, MD [facility] [place] Office: [place]

## 2022-07-21 NOTE — Progress Notes (Signed)
PROGRESS NOTE    [name]  JIR:[id number] DOB: [birth year] DOA: 07/19/2022 PCP: Ginger Organ., MD  Chief Complaint  Patient presents with   Altered Mental Status    Brief Narrative:  [name] is Stephen Eugene 87 y.o. male with medical history significant of CAD, T2DM, GERD, HLD, HTN, PAD, hx of CVA who presented to ED with change in mental status two days ago.  For 2 days he has been lethargic and confused. His caregiver also thought he had Lynnie Koehler left facial droop and so did the hairdresser. She thinks this is better. He has chronic left sided weakness and this seems unchanged.   He has stopped talking almost completely. He typically will converse with his wife some, but this is abnormal for him. He typically walks with Chloeann Alfred walker, but 2 days ago was in wheel chair.    At baseline he does talk, but very little. He walks around the house with walker. He needs full assist x 2 for showering. He needs help with dressing. He can feed himself. No falls in the last year.    He has not had any fever/chills. No open sores or bed sores. He has been coughing for 2 days that is dry,but at times is clear phlegm. No odor to urine, unsure of color change. He has been eating and drinking well. No N/V/D.      He does not smoke or drink alcohol.    ER Course:  vitals: afebrile, bp: 135/61, HR; 69, RR: 20, oxygen: 98%RA Pertinent labs: BUN: 33, creatinine: 1.29,  CXR: mild bibasilar subsegmental atelectasis or infiltrates CT head: no acute finding  In ED: given 1L IVF and rocephin/azithromycin. BC obtained. TRH asked to admit.   Assessment & Plan:   Principal Problem:   Acute encephalopathy Active Problems:   Pulmonary infiltrates   Type 2 diabetes mellitus with stage 3a chronic kidney disease, without long-term current use of insulin (HCC)   Coronary artery disease involving native heart without angina pectoris   Essential hypertension   Chronic kidney disease, stage 3a (HCC)   Dementia without  behavioral disturbance ([place])   GERD without esophagitis   Stroke ([place])   Assessment and Plan: Left Basal Ganglia Infarct ? Related to encephalopathy below MRI with 2.3 cm acute ischemic L basal ganglia infarct CTA head/neck with near occlusion of proximal M1 segment of L MCA with reconstitution of distal M1 and M2 segments, severe stenosis at origin of bilateral internal carotid arteries Echo pending A1c 6.5, lipid panel pending, TSH wnl PT/OT/SLP Continue tele monitoring - runs concerning for fib, will c/s cards Vascular evaluating per neuro given severe (90%) stenosis on the L Already on plavix, aspirin started.  Has not tolerated statins in the past.  Zetia. Appreciate neurology assistance  Dysphagia SLP recommending dysphagia 3, thin liquids Will follow SLP recs  Acute encephalopathy  History of Dementia Related to stroke vs pneumonia MRI as above UA not c/w UTI TSH wnl B12 low normal, follow MMA - supplement with oral b12 while awaiting results PT/OT for weakness Delirium precautions   Pulmonary infiltrates Being treated for possible pneumonia Patient is afebrile with no leukocytosis, normal lactic acid, has had Denzel Etienne cough  CAP coverage, plan for 5 day course Urine strep negative, urine legionella pending Negative RVP negative covid/flu Blood cx pending   Type 2 diabetes mellitus with stage 3a chronic kidney disease, without long-term current use of insulin (HCC) A1c 6.5 Hold metformin while inpatient SSI and accuchecks  QAC/HS    Coronary artery disease involving native heart without angina pectoris Hx of CABG Can not tolerate statins Continue toprol-xl and plavix    Essential hypertension Soft blood pressures hold toprol-xl and altace    Chronic kidney disease, stage 3a (HCC) At baseline, continue to monitor    Dementia without behavioral disturbance ([place]) delerium precautions    GERD without esophagitis Continue protonix       DVT prophylaxis:  lovenox Code Status: full Family Communication: wife at bedside Disposition:   Status is: Observation The patient remains OBS appropriate and will d/c before 2 midnights.   Consultants:  neurology  Procedures:  none  Antimicrobials:  Anti-infectives (From admission, onward)    Start     Dose/Rate Route Frequency Ordered Stop   07/20/22 1745  cefTRIAXone (ROCEPHIN) 2 g in sodium chloride 0.9 % 100 mL IVPB        2 g 200 mL/hr over 30 Minutes Intravenous Every 24 hours 07/19/22 1744 07/25/22 1744   07/20/22 1600  azithromycin (ZITHROMAX) 500 mg in sodium chloride 0.9 % 250 mL IVPB        500 mg 250 mL/hr over 60 Minutes Intravenous Every 24 hours 07/19/22 1745 07/25/22 1559   07/19/22 1615  cefTRIAXone (ROCEPHIN) 2 g in sodium chloride 0.9 % 100 mL IVPB        2 g 200 mL/hr over 30 Minutes Intravenous  Once 07/19/22 1607 07/19/22 1725   07/19/22 1615  azithromycin (ZITHROMAX) 500 mg in sodium chloride 0.9 % 250 mL IVPB        500 mg 250 mL/hr over 60 Minutes Intravenous  Once 07/19/22 1607 07/19/22 1742       Subjective: No complaints, sleeping and awakens appropriately Wife concerned about his swallowing   Objective: Vitals:   07/20/22 2319 07/21/22 0015 07/21/22 0351 07/21/22 1100  BP: (!) 152/105 (!) 153/92 (!) 152/85 (!) 150/74  Pulse: 75  88 85  Resp: '18  17 17  '$ Temp: 97.7 F (36.5 C)  97.6 F (36.4 C) 98 F (36.7 C)  TempSrc: Axillary  Axillary Axillary  SpO2: 99%  92% 98%  Weight:      Height:        Intake/Output Summary (Last 24 hours) at 07/21/2022 1516 Last data filed at 07/21/2022 0500 Gross per 24 hour  Intake --  Output 400 ml  Net -400 ml   Filed Weights   07/19/22 1753  Weight: 79.4 kg    Examination:  General: No acute distress. Cardiovascular: RRR Lungs: unlabored Abdomen: Soft, nontender, nondistended  Neurological: pleasantly confused, moving all extremities Extremities: No clubbing or cyanosis. No edema.   Data Reviewed: I  have personally reviewed following labs and imaging studies  CBC: Recent Labs  Lab 07/19/22 1504 07/19/22 1537 07/20/22 0335 07/21/22 0338  WBC 8.4  --  8.0 7.9  NEUTROABS 5.3  --   --  4.3  HGB 13.2 13.3 12.5* 12.1*  HCT 39.7 39.0 36.4* 34.2*  MCV 90.4  --  88.6 85.7  PLT 208  --  171 220    Basic Metabolic Panel: Recent Labs  Lab 07/19/22 1504 07/19/22 1537 07/20/22 0335 07/21/22 0338  NA 134* 133* 138 134*  K 4.9 5.9* 4.2 3.6  CL 101  --  105 104  CO2 24  --  24 21*  GLUCOSE 186*  --  105* 111*  BUN 33*  --  25* 19  CREATININE 1.29*  --  1.10 0.91  CALCIUM  9.3  --  9.2 8.7*  MG  --   --   --  1.4*  PHOS  --   --   --  2.8    GFR: Estimated Creatinine Clearance: 66.7 mL/min (by C-G formula based on SCr of 0.91 mg/dL).  Liver Function Tests: Recent Labs  Lab 07/19/22 1504 07/21/22 0338  AST 25 19  ALT 10 9  ALKPHOS 74 68  BILITOT 0.8 0.5  PROT 6.9 6.1*  ALBUMIN 3.6 3.2*    CBG: Recent Labs  Lab 07/20/22 0821 07/20/22 1137 07/20/22 2206 07/21/22 0636 07/21/22 1212  GLUCAP 98 129* 127* 132* 148*     Recent Results (from the past 240 hour(s))  Blood Culture (Routine X 2)     Status: None (Preliminary result)   Collection Time: 07/19/22  2:50 PM   Specimen: BLOOD  Result Value Ref Range Status   Specimen Description BLOOD SITE NOT SPECIFIED  Final   Special Requests   Final    BOTTLES DRAWN AEROBIC AND ANAEROBIC Blood Culture adequate volume   Culture   Final    NO GROWTH 2 DAYS Performed at [facility], [facility] [address] [place], [place] [address]    Report Status PENDING  Incomplete  Blood Culture (Routine X 2)     Status: None (Preliminary result)   Collection Time: 07/19/22  3:00 PM   Specimen: BLOOD  Result Value Ref Range Status   Specimen Description BLOOD SITE NOT SPECIFIED  Final   Special Requests   Final    BOTTLES DRAWN AEROBIC AND ANAEROBIC Blood Culture results may not be optimal due to an inadequate volume of blood  received in culture bottles   Culture   Final    NO GROWTH 2 DAYS Performed at [facility], [place] [address] [place], [place] [address]    Report Status PENDING  Incomplete  Resp Panel by RT-PCR (Flu Belissa Kooy&B, Covid) Anterior Nasal Swab     Status: None   Collection Time: 07/19/22  3:36 PM   Specimen: Anterior Nasal Swab  Result Value Ref Range Status   SARS Coronavirus 2 by RT PCR NEGATIVE NEGATIVE Final    Comment: (NOTE) SARS-CoV-2 target nucleic acids are NOT DETECTED.  The SARS-CoV-2 RNA is generally detectable in upper respiratory specimens during the acute phase of infection. The lowest concentration of SARS-CoV-2 viral copies this assay can detect is 138 copies/mL. Maddeline Roorda negative result does not preclude SARS-Cov-2 infection and should not be used as the sole basis for treatment or other patient management decisions. Bernon Arviso negative result may occur with  improper specimen collection/handling, submission of specimen other than nasopharyngeal swab, presence of viral mutation(s) within the areas targeted by this assay, and inadequate number of viral copies(<138 copies/mL). Rondal Vandevelde negative result must be combined with clinical observations, patient history, and epidemiological information. The expected result is Negative.  Fact Sheet for Patients:  EntrepreneurPulse.com.au  Fact Sheet for Healthcare Providers:  IncredibleEmployment.be  This test is no t yet approved or cleared by the [place] FDA and  has been authorized for detection and/or diagnosis of SARS-CoV-2 by FDA under an Emergency Use Authorization (EUA). This EUA will remain  in effect (meaning this test can be used) for the duration of the COVID-19 declaration under Section 564(b)(1) of the Act, 21 U.S.C.section 360bbb-3(b)(1), unless the authorization is terminated  or revoked sooner.       Influenza Dylanie Quesenberry by PCR NEGATIVE NEGATIVE Final   Influenza B by PCR  NEGATIVE NEGATIVE Final     Comment: (NOTE) The Xpert Xpress SARS-CoV-2/FLU/RSV plus assay is intended as an aid in the diagnosis of influenza from Nasopharyngeal swab specimens and should not be used as Franny Selvage sole basis for treatment. Nasal washings and aspirates are unacceptable for Xpert Xpress SARS-CoV-2/FLU/RSV testing.  Fact Sheet for Patients: EntrepreneurPulse.com.au  Fact Sheet for Healthcare Providers: IncredibleEmployment.be  This test is not yet approved or cleared by the [place] FDA and has been authorized for detection and/or diagnosis of SARS-CoV-2 by FDA under an Emergency Use Authorization (EUA). This EUA will remain in effect (meaning this test can be used) for the duration of the COVID-19 declaration under Section 564(b)(1) of the Act, 21 U.S.C. section 360bbb-3(b)(1), unless the authorization is terminated or revoked.  Performed at [facility], [address] [place], [place] [address]   Respiratory (~20 pathogens) panel by PCR     Status: None   Collection Time: 07/19/22  3:36 PM   Specimen: Nasopharyngeal Swab; Respiratory  Result Value Ref Range Status   Adenovirus NOT DETECTED NOT DETECTED Final   Coronavirus 229E NOT DETECTED NOT DETECTED Final    Comment: (NOTE) The Coronavirus on the Respiratory Panel, DOES NOT test for the novel  Coronavirus (2019 nCoV)    Coronavirus HKU1 NOT DETECTED NOT DETECTED Final   Coronavirus NL63 NOT DETECTED NOT DETECTED Final   Coronavirus OC43 NOT DETECTED NOT DETECTED Final   Metapneumovirus NOT DETECTED NOT DETECTED Final   Rhinovirus / Enterovirus NOT DETECTED NOT DETECTED Final   Influenza Sherrod Toothman NOT DETECTED NOT DETECTED Final   Influenza B NOT DETECTED NOT DETECTED Final   Parainfluenza Virus 1 NOT DETECTED NOT DETECTED Final   Parainfluenza Virus 2 NOT DETECTED NOT DETECTED Final   Parainfluenza Virus 3 NOT DETECTED NOT DETECTED Final   Parainfluenza Virus 4 NOT DETECTED NOT DETECTED Final    Respiratory Syncytial Virus NOT DETECTED NOT DETECTED Final   Bordetella pertussis NOT DETECTED NOT DETECTED Final   Bordetella Parapertussis NOT DETECTED NOT DETECTED Final   Chlamydophila pneumoniae NOT DETECTED NOT DETECTED Final   Mycoplasma pneumoniae NOT DETECTED NOT DETECTED Final    Comment: Performed at [facility], [place]. [address], [place], [place] [address]         Radiology Studies: ECHOCARDIOGRAM COMPLETE  Result Date: 07/20/2022    ECHOCARDIOGRAM REPORT   Patient Name:   Doral Loletha Grayer Radick Date of Exam: 07/20/2022 Medical Rec #:  [id number]    Height:       74.0 in Accession #:    [id number]   Weight:       175.0 lb Date of Birth:  07-06-1935     BSA:          2.054 m Patient Age:    57 years     BP:           144/64 mmHg Patient Gender: M            HR:           53 bpm. Exam Location:  Inpatient Procedure: 2D Echo, Color Doppler and Cardiac Doppler Indications:    Stroke  History:        Patient has prior history of Echocardiogram examinations, most                 recent 03/03/2020. CAD; Risk Factors:Diabetes, Dyslipidemia and                 Hypertension.  Sonographer:    Memory Argue Referring Phys: [phone] Krystelle Prashad CALDWELL [place]  1. Left ventricular ejection fraction, by estimation, is 60 to 65%. The left ventricle has normal function. The left ventricle has no regional wall motion abnormalities. Left ventricular diastolic function could not be evaluated.  2. Right ventricular systolic function was not well visualized. The right ventricular size is normal.  3. Left atrial size was mildly dilated.  4. The mitral valve is abnormal. Not well visualized mitral valve regurgitation. No evidence of mitral stenosis.  5. The aortic valve is tricuspid. There is moderate calcification of the aortic valve. There is mild thickening of the aortic valve. Aortic valve regurgitation is trivial. Aortic valve sclerosis/calcification is present, without any evidence of aortic stenosis.  Comparison(s): Changes from prior study are noted. Mitral regurgitation noted as moderate to severe on prior study. Not well visualized on current study. Conclusion(s)/Recommendation(s): Otherwise normal echocardiogram, with minor abnormalities described in the report. No intracardiac source of embolism detected on this transthoracic study. Consider Jamesen Stahnke transesophageal echocardiogram to exclude cardiac source of embolism if clinically indicated. FINDINGS  Left Ventricle: Left ventricular ejection fraction, by estimation, is 60 to 65%. The left ventricle has normal function. The left ventricle has no regional wall motion abnormalities. The left ventricular internal cavity size was normal in size. There is  no left ventricular hypertrophy. Left ventricular diastolic function could not be evaluated due to atrial fibrillation. Left ventricular diastolic function could not be evaluated. Right Ventricle: The right ventricular size is normal. Right vetricular wall thickness was not well visualized. Right ventricular systolic function was not well visualized. Left Atrium: Left atrial size was mildly dilated. Right Atrium: Right atrial size was normal in size. Pericardium: There is no evidence of pericardial effusion. Mitral Valve: Calcified subvalvular apparatus. The mitral valve is abnormal. There is mild thickening of the mitral valve leaflet(s). There is moderate calcification of the mitral valve leaflet(s). Not well visualized mitral valve regurgitation. No evidence of mitral valve stenosis. Tricuspid Valve: The tricuspid valve is normal in structure. Tricuspid valve regurgitation is mild . No evidence of tricuspid stenosis. Aortic Valve: The aortic valve is tricuspid. There is moderate calcification of the aortic valve. There is mild thickening of the aortic valve. Aortic valve regurgitation is trivial. Aortic valve sclerosis/calcification is present, without any evidence of aortic stenosis. Aortic valve mean gradient  measures 3.0 mmHg. Aortic valve peak gradient measures 5.0 mmHg. Aortic valve area, by VTI measures 2.22 cm. Pulmonic Valve: The pulmonic valve was not well visualized. Pulmonic valve regurgitation is not visualized. Aorta: The ascending aorta was not well visualized. Venous: The inferior vena cava was not well visualized. IAS/Shunts: The interatrial septum was not well visualized.  LEFT VENTRICLE PLAX 2D LVIDd:         4.60 cm   Diastology LVIDs:         3.20 cm   LV e' medial:    6.31 cm/s LV PW:         1.00 cm   LV E/e' medial:  18.7 LV IVS:        1.00 cm   LV e' lateral:   6.64 cm/s LVOT diam:     2.20 cm   LV E/e' lateral: 17.8 LV SV:         62 LV SV Index:   30 LVOT Area:     3.80 cm  RIGHT VENTRICLE RV S prime:     7.83 cm/s LEFT ATRIUM  Index        RIGHT ATRIUM           Index LA diam:        3.20 cm 1.56 cm/m   RA Area:     11.50 cm LA Vol (A2C):   44.6 ml 21.72 ml/m  RA Volume:   20.80 ml  10.13 ml/m LA Vol (A4C):   68.4 ml 33.31 ml/m LA Biplane Vol: 56.0 ml 27.27 ml/m  AORTIC VALVE AV Area (Vmax):    2.37 cm AV Area (Vmean):   2.20 cm AV Area (VTI):     2.22 cm AV Vmax:           112.00 cm/s AV Vmean:          74.900 cm/s AV VTI:            0.281 m AV Peak Grad:      5.0 mmHg AV Mean Grad:      3.0 mmHg LVOT Vmax:         69.70 cm/s LVOT Vmean:        43.300 cm/s LVOT VTI:          0.164 m LVOT/AV VTI ratio: 0.58  AORTA Ao Root diam: 2.60 cm MITRAL VALVE                TRICUSPID VALVE MV Area (PHT): 3.53 cm     TR Peak grad:   20.8 mmHg MV Decel Time: 215 msec     TR Vmax:        228.00 cm/s MR Peak grad: 61.3 mmHg MR Vmax:      391.33 cm/s   SHUNTS MV E velocity: 118.00 cm/s  Systemic VTI:  0.16 m MV Yael Coppess velocity: 120.00 cm/s  Systemic Diam: 2.20 cm MV E/Eward Rutigliano ratio:  0.98 Buford Dresser MD Electronically signed by Buford Dresser MD Signature Date/Time: 07/20/2022/6:24:41 PM    Final    CT ANGIO HEAD NECK W WO CM  Result Date: 07/20/2022 CLINICAL DATA:  Stroke  follow-up EXAM: CT ANGIOGRAPHY HEAD AND NECK TECHNIQUE: Multidetector CT imaging of the head and neck was performed using the standard protocol during bolus administration of intravenous contrast. Multiplanar CT image reconstructions and MIPs were obtained to evaluate the vascular anatomy. Carotid stenosis measurements (when applicable) are obtained utilizing NASCET criteria, using the distal internal carotid diameter as the denominator. RADIATION DOSE REDUCTION: This exam was performed according to the departmental dose-optimization program which includes automated exposure control, adjustment of the mA and/or kV according to patient size and/or use of iterative reconstruction technique. CONTRAST:  43m OMNIPAQUE IOHEXOL 350 MG/ML SOLN COMPARISON:  MRI head 07/19/2022, CT head 07/19/2022 FINDINGS: Limitations: Assessment is slightly limited due to the degree of motion artifact. Within this limitation CT HEAD FINDINGS Brain: Redemonstrated severe chronic microvascular ischemic change and advanced generalized volume loss. Redemonstrated hypodensity in the left basal ganglia in the region of known infarction. Unchanged size and shape of the ventricular system. No evidence of intracranial hemorrhage. Vascular: See below for vascular findings. Skull: Normal. Negative for fracture or focal lesion. Sinuses/Orbits: Bilateral lens replacement.  Sinuses are clear. Other: None. Review of the MIP images confirms the above findings CTA NECK FINDINGS Aortic arch: Standard branching. Imaged portion shows no evidence of aneurysm or dissection. No significant stenosis of the major arch vessel origins. Right carotid system: There is scattered atherosclerotic plaque in the right common carotid artery. There is severe greater than 75% stenosis at the origin of the  right internal carotid artery (series 15, image 212) Left carotid system: There is scattered atherosclerotic plaque in the left common carotid artery. There is severe  (approximate 90% stenosis at the origin of the left internal carotid artery (series 15, image 217). Vertebral arteries: There is mild stenosis of the origin of the right vertebral artery. There is mild stenosis in the distal V2 segment of the left vertebral artery (series 15, image 149) Skeleton: Negative. There is likely at least moderate spinal canal stenosis at C4-C5. There is Anam Bobby degenerative pannus at C1-C2. Other neck: Negative. Upper chest: There is severe centrilobular emphysema. Review of the MIP images confirms the above findings CTA HEAD FINDINGS Anterior circulation: There is near occlusion of the proximal M1 segment of the left MCA (series 15, image 90). The distal M1 and M2 segments are contrast opacified. Assessment of the A2 and A3 segments of the ACA is limited due to motion artifact. Posterior circulation: Assessment of the P2 and P3 segments of the bilateral PCAs is limited due to motion artifact. No significant stenosis, proximal occlusion, aneurysm, or vascular malformation. Venous sinuses: As permitted by contrast timing, patent. Anatomic variants: None Review of the MIP images confirms the above findings IMPRESSION: 1. Near occlusion of the proximal M1 segment of the left MCA with reconstitution of the distal M1 and M2 segments. 2. Severe stenosis at the origin of bilateral internal carotid arteries (greater than 75% on the right and approximately 90% on the left). 3. Redemonstrated hypodensities in the left basal ganglia, in keeping with patient's history of left basal ganglia infarct. No evidence of intracranial hemorrhage. 4. There is atherosclerotic plaque in the left subclavian artery resulting in moderate stenosis, proximal to the takeoff of the left vertebral artery. Electronically Signed   By: Marin Roberts M.D.   On: 07/20/2022 09:22   MR BRAIN WO CONTRAST  Result Date: 07/19/2022 CLINICAL DATA:  Initial evaluation for neuro deficit, stroke suspected. EXAM: MRI HEAD WITHOUT CONTRAST  TECHNIQUE: Multiplanar, multiecho pulse sequences of the brain and surrounding structures were obtained without intravenous contrast. COMPARISON:  Prior CT from earlier the same day as well as earlier studies. FINDINGS: Brain: Examination degraded by motion artifact. Generalized age-related cerebral atrophy. Patchy and confluent T2/FLAIR hyperintensity involving the periventricular deep white matter both cerebral hemispheres as well as the pons, most consistent with chronic small vessel ischemic disease. Few scatter remote lacunar infarcts noted about the deep gray nuclei. 2.3 cm acute ischemic nonhemorrhagic infarcts seen involving the left basal ganglia (series 5, image 83). No other evidence for acute or subacute ischemia. Gray-white matter differentiation otherwise maintained. No acute or chronic intracranial blood products. No mass lesion, midline shift or mass effect. Diffuse ventricular prominence related to global parenchymal volume loss without hydrocephalus. No extra-axial fluid collection. Pituitary gland and suprasellar region within normal limits. Vascular: Major intracranial vascular flow voids are maintained. Skull and upper cervical spine: Craniocervical junction within normal limits. Bone marrow signal intensity normal. No scalp soft tissue abnormality. Sinuses/Orbits: Patient status post bilateral ocular lens replacement. Scattered mucosal thickening noted about the paranasal sinuses. No significant mastoid effusion. Other: None. IMPRESSION: 1. 2.3 cm acute ischemic nonhemorrhagic left basal ganglia infarct. 2. Underlying age-related cerebral atrophy with mild chronic small vessel ischemic disease. Electronically Signed   By: Jeannine Boga M.D.   On: 07/19/2022 23:54   CT HEAD WO CONTRAST  Result Date: 07/19/2022 CLINICAL DATA:  Mental status change, persistent or worsening EXAM: CT HEAD WITHOUT CONTRAST TECHNIQUE: Contiguous axial images  were obtained from the base of the skull through  the vertex without intravenous contrast. RADIATION DOSE REDUCTION: This exam was performed according to the departmental dose-optimization program which includes automated exposure control, adjustment of the mA and/or kV according to patient size and/or use of iterative reconstruction technique. COMPARISON:  CT head 07/09/2021. FINDINGS: Motion limited study. Brain: No evidence of acute large vascular territory infarct, acute hemorrhage, mass lesion, midline shift or hydrocephalus. Cerebral atrophy with ex vacuo ventricular dilation. Confluent and patchy hypodensities in the white matter, nonspecific but likely the sequela of chronic microvascular ischemic disease. Vascular: No hyperdense vessel identified. Calcific intracranial atherosclerosis. Skull: No acute fracture. Sinuses/Orbits: Largely clear sinuses.  No acute orbital findings. Other: No mastoid effusions. IMPRESSION: 1. No evidence of acute intracranial abnormality. 2. Similar chronic microvascular ischemic disease and cerebral atrophy (ICD10-G31.9). Electronically Signed   By: Margaretha Sheffield M.D.   On: 07/19/2022 15:52        Scheduled Meds:  aspirin  300 mg Rectal Daily   Or   aspirin  325 mg Oral Daily   clopidogrel  75 mg Oral Q supper   enoxaparin (LOVENOX) injection  40 mg Subcutaneous Q24H   ezetimibe  10 mg Oral Daily   insulin aspart  0-9 Units Subcutaneous TID WC   pantoprazole  40 mg Oral QAC supper   Continuous Infusions:  azithromycin (ZITHROMAX) 500 mg in sodium chloride 0.9 % 250 mL IVPB Stopped (07/20/22 1909)   cefTRIAXone (ROCEPHIN)  IV Stopped (07/20/22 1901)     LOS: 1 day    Time spent: over 49 min    Fayrene Helper, MD [facility]   To contact the attending provider between 7A-7P or the covering provider during after hours 7P-7A, please log into the web site [email or web address] and access using universal [employer] password for that web site. If you do not have the password, please call the  hospital operator.  07/21/2022, 3:16 PM

## 2022-07-21 NOTE — Progress Notes (Signed)
STROKE TEAM PROGRESS NOTE   SUBJECTIVE (INTERVAL HISTORY) No family is at the bedside.  Overall his condition is rapidly improving. Pt lethargic, however, eyes open, awake alert, only oriented to place, but able to name and repeat.  Moving all extremities.   OBJECTIVE Temp:  [97.6 F (36.4 C)-98.1 F (36.7 C)] 98.1 F (36.7 C) (09/21 2037) Pulse Rate:  [55-88] 77 (09/21 2037) Cardiac Rhythm: Atrial fibrillation (09/21 1900) Resp:  [12-18] 16 (09/21 2037) BP: (134-153)/(60-105) 134/64 (09/21 2037) SpO2:  [92 %-100 %] 99 % (09/21 2037)  Recent Labs  Lab 07/20/22 2206 07/21/22 0636 07/21/22 1212 07/21/22 1633 07/21/22 2044  GLUCAP 127* 132* 148* 97 173*   Recent Labs  Lab 07/19/22 1504 07/19/22 1537 07/20/22 0335 07/21/22 0338  NA 134* 133* 138 134*  K 4.9 5.9* 4.2 3.6  CL 101  --  105 104  CO2 24  --  24 21*  GLUCOSE 186*  --  105* 111*  BUN 33*  --  25* 19  CREATININE 1.29*  --  1.10 0.91  CALCIUM 9.3  --  9.2 8.7*  MG  --   --   --  1.4*  PHOS  --   --   --  2.8   Recent Labs  Lab 07/19/22 1504 07/21/22 0338  AST 25 19  ALT 10 9  ALKPHOS 74 68  BILITOT 0.8 0.5  PROT 6.9 6.1*  ALBUMIN 3.6 3.2*   Recent Labs  Lab 07/19/22 1504 07/19/22 1537 07/20/22 0335 07/21/22 0338  WBC 8.4  --  8.0 7.9  NEUTROABS 5.3  --   --  4.3  HGB 13.2 13.3 12.5* 12.1*  HCT 39.7 39.0 36.4* 34.2*  MCV 90.4  --  88.6 85.7  PLT 208  --  171 176   No results for input(s): "CKTOTAL", "CKMB", "CKMBINDEX", "TROPONINI" in the last 168 hours. No results for input(s): "LABPROT", "INR" in the last 72 hours. Recent Labs    07/19/22 1747  COLORURINE YELLOW  LABSPEC 1.017  PHURINE 6.0  GLUCOSEU NEGATIVE  HGBUR NEGATIVE  BILIRUBINUR NEGATIVE  KETONESUR NEGATIVE  PROTEINUR NEGATIVE  NITRITE NEGATIVE  LEUKOCYTESUR NEGATIVE       Component Value Date/Time   CHOL 149 07/21/2022 0338   CHOL 170 02/18/2020 1058   TRIG 71 07/21/2022 0338   HDL 31 (L) 07/21/2022 0338   HDL 42  02/18/2020 1058   CHOLHDL 4.8 07/21/2022 0338   VLDL 14 07/21/2022 0338   LDLCALC 104 (H) 07/21/2022 0338   LDLCALC 107 (H) 02/18/2020 1058   Lab Results  Component Value Date   HGBA1C 6.5 (H) 07/19/2022      Component Value Date/Time   LABOPIA NONE DETECTED 07/19/2022 1747   COCAINSCRNUR NONE DETECTED 07/19/2022 1747   LABBENZ NONE DETECTED 07/19/2022 1747   AMPHETMU NONE DETECTED 07/19/2022 1747   THCU NONE DETECTED 07/19/2022 1747   LABBARB NONE DETECTED 07/19/2022 1747    Recent Labs  Lab 07/19/22 1506  ETH <10    I have personally reviewed the radiological images below and agree with the radiology interpretations.  ECHOCARDIOGRAM COMPLETE  Result Date: 07/20/2022    ECHOCARDIOGRAM REPORT   Patient Name:   Dshawn Loletha Grayer Gildner Date of Exam: 07/20/2022 Medical Rec #:  [id number]    Height:       74.0 in Accession #:    [id number]   Weight:       175.0 lb Date of Birth:  Oct 26, 1935     BSA:  2.054 m Patient Age:    72 years     BP:           144/64 mmHg Patient Gender: M            HR:           53 bpm. Exam Location:  Inpatient Procedure: 2D Echo, Color Doppler and Cardiac Doppler Indications:    Stroke  History:        Patient has prior history of Echocardiogram examinations, most                 recent 03/03/2020. CAD; Risk Factors:Diabetes, Dyslipidemia and                 Hypertension.  Sonographer:    Memory Argue Referring Phys: [phone] A CALDWELL [place]  1. Left ventricular ejection fraction, by estimation, is 60 to 65%. The left ventricle has normal function. The left ventricle has no regional wall motion abnormalities. Left ventricular diastolic function could not be evaluated.  2. Right ventricular systolic function was not well visualized. The right ventricular size is normal.  3. Left atrial size was mildly dilated.  4. The mitral valve is abnormal. Not well visualized mitral valve regurgitation. No evidence of mitral stenosis.  5. The aortic valve is tricuspid.  There is moderate calcification of the aortic valve. There is mild thickening of the aortic valve. Aortic valve regurgitation is trivial. Aortic valve sclerosis/calcification is present, without any evidence of aortic stenosis. Comparison(s): Changes from prior study are noted. Mitral regurgitation noted as moderate to severe on prior study. Not well visualized on current study. Conclusion(s)/Recommendation(s): Otherwise normal echocardiogram, with minor abnormalities described in the report. No intracardiac source of embolism detected on this transthoracic study. Consider a transesophageal echocardiogram to exclude cardiac source of embolism if clinically indicated. FINDINGS  Left Ventricle: Left ventricular ejection fraction, by estimation, is 60 to 65%. The left ventricle has normal function. The left ventricle has no regional wall motion abnormalities. The left ventricular internal cavity size was normal in size. There is  no left ventricular hypertrophy. Left ventricular diastolic function could not be evaluated due to atrial fibrillation. Left ventricular diastolic function could not be evaluated. Right Ventricle: The right ventricular size is normal. Right vetricular wall thickness was not well visualized. Right ventricular systolic function was not well visualized. Left Atrium: Left atrial size was mildly dilated. Right Atrium: Right atrial size was normal in size. Pericardium: There is no evidence of pericardial effusion. Mitral Valve: Calcified subvalvular apparatus. The mitral valve is abnormal. There is mild thickening of the mitral valve leaflet(s). There is moderate calcification of the mitral valve leaflet(s). Not well visualized mitral valve regurgitation. No evidence of mitral valve stenosis. Tricuspid Valve: The tricuspid valve is normal in structure. Tricuspid valve regurgitation is mild . No evidence of tricuspid stenosis. Aortic Valve: The aortic valve is tricuspid. There is moderate  calcification of the aortic valve. There is mild thickening of the aortic valve. Aortic valve regurgitation is trivial. Aortic valve sclerosis/calcification is present, without any evidence of aortic stenosis. Aortic valve mean gradient measures 3.0 mmHg. Aortic valve peak gradient measures 5.0 mmHg. Aortic valve area, by VTI measures 2.22 cm. Pulmonic Valve: The pulmonic valve was not well visualized. Pulmonic valve regurgitation is not visualized. Aorta: The ascending aorta was not well visualized. Venous: The inferior vena cava was not well visualized. IAS/Shunts: The interatrial septum was not well visualized.  LEFT VENTRICLE PLAX 2D LVIDd:  4.60 cm   Diastology LVIDs:         3.20 cm   LV e' medial:    6.31 cm/s LV PW:         1.00 cm   LV E/e' medial:  18.7 LV IVS:        1.00 cm   LV e' lateral:   6.64 cm/s LVOT diam:     2.20 cm   LV E/e' lateral: 17.8 LV SV:         62 LV SV Index:   30 LVOT Area:     3.80 cm  RIGHT VENTRICLE RV S prime:     7.83 cm/s LEFT ATRIUM             Index        RIGHT ATRIUM           Index LA diam:        3.20 cm 1.56 cm/m   RA Area:     11.50 cm LA Vol (A2C):   44.6 ml 21.72 ml/m  RA Volume:   20.80 ml  10.13 ml/m LA Vol (A4C):   68.4 ml 33.31 ml/m LA Biplane Vol: 56.0 ml 27.27 ml/m  AORTIC VALVE AV Area (Vmax):    2.37 cm AV Area (Vmean):   2.20 cm AV Area (VTI):     2.22 cm AV Vmax:           112.00 cm/s AV Vmean:          74.900 cm/s AV VTI:            0.281 m AV Peak Grad:      5.0 mmHg AV Mean Grad:      3.0 mmHg LVOT Vmax:         69.70 cm/s LVOT Vmean:        43.300 cm/s LVOT VTI:          0.164 m LVOT/AV VTI ratio: 0.58  AORTA Ao Root diam: 2.60 cm MITRAL VALVE                TRICUSPID VALVE MV Area (PHT): 3.53 cm     TR Peak grad:   20.8 mmHg MV Decel Time: 215 msec     TR Vmax:        228.00 cm/s MR Peak grad: 61.3 mmHg MR Vmax:      391.33 cm/s   SHUNTS MV E velocity: 118.00 cm/s  Systemic VTI:  0.16 m MV A velocity: 120.00 cm/s  Systemic Diam: 2.20  cm MV E/A ratio:  0.98 Buford Dresser MD Electronically signed by Buford Dresser MD Signature Date/Time: 07/20/2022/6:24:41 PM    Final    CT ANGIO HEAD NECK W WO CM  Result Date: 07/20/2022 CLINICAL DATA:  Stroke follow-up EXAM: CT ANGIOGRAPHY HEAD AND NECK TECHNIQUE: Multidetector CT imaging of the head and neck was performed using the standard protocol during bolus administration of intravenous contrast. Multiplanar CT image reconstructions and MIPs were obtained to evaluate the vascular anatomy. Carotid stenosis measurements (when applicable) are obtained utilizing NASCET criteria, using the distal internal carotid diameter as the denominator. RADIATION DOSE REDUCTION: This exam was performed according to the departmental dose-optimization program which includes automated exposure control, adjustment of the mA and/or kV according to patient size and/or use of iterative reconstruction technique. CONTRAST:  45m OMNIPAQUE IOHEXOL 350 MG/ML SOLN COMPARISON:  MRI head 07/19/2022, CT head 07/19/2022 FINDINGS: Limitations: Assessment is slightly limited due to the degree of  motion artifact. Within this limitation CT HEAD FINDINGS Brain: Redemonstrated severe chronic microvascular ischemic change and advanced generalized volume loss. Redemonstrated hypodensity in the left basal ganglia in the region of known infarction. Unchanged size and shape of the ventricular system. No evidence of intracranial hemorrhage. Vascular: See below for vascular findings. Skull: Normal. Negative for fracture or focal lesion. Sinuses/Orbits: Bilateral lens replacement.  Sinuses are clear. Other: None. Review of the MIP images confirms the above findings CTA NECK FINDINGS Aortic arch: Standard branching. Imaged portion shows no evidence of aneurysm or dissection. No significant stenosis of the major arch vessel origins. Right carotid system: There is scattered atherosclerotic plaque in the right common carotid artery. There  is severe greater than 75% stenosis at the origin of the right internal carotid artery (series 15, image 212) Left carotid system: There is scattered atherosclerotic plaque in the left common carotid artery. There is severe (approximate 90% stenosis at the origin of the left internal carotid artery (series 15, image 217). Vertebral arteries: There is mild stenosis of the origin of the right vertebral artery. There is mild stenosis in the distal V2 segment of the left vertebral artery (series 15, image 149) Skeleton: Negative. There is likely at least moderate spinal canal stenosis at C4-C5. There is a degenerative pannus at C1-C2. Other neck: Negative. Upper chest: There is severe centrilobular emphysema. Review of the MIP images confirms the above findings CTA HEAD FINDINGS Anterior circulation: There is near occlusion of the proximal M1 segment of the left MCA (series 15, image 90). The distal M1 and M2 segments are contrast opacified. Assessment of the A2 and A3 segments of the ACA is limited due to motion artifact. Posterior circulation: Assessment of the P2 and P3 segments of the bilateral PCAs is limited due to motion artifact. No significant stenosis, proximal occlusion, aneurysm, or vascular malformation. Venous sinuses: As permitted by contrast timing, patent. Anatomic variants: None Review of the MIP images confirms the above findings IMPRESSION: 1. Near occlusion of the proximal M1 segment of the left MCA with reconstitution of the distal M1 and M2 segments. 2. Severe stenosis at the origin of bilateral internal carotid arteries (greater than 75% on the right and approximately 90% on the left). 3. Redemonstrated hypodensities in the left basal ganglia, in keeping with patient's history of left basal ganglia infarct. No evidence of intracranial hemorrhage. 4. There is atherosclerotic plaque in the left subclavian artery resulting in moderate stenosis, proximal to the takeoff of the left vertebral artery.  Electronically Signed   By: Marin Roberts M.D.   On: 07/20/2022 09:22   MR BRAIN WO CONTRAST  Result Date: 07/19/2022 CLINICAL DATA:  Initial evaluation for neuro deficit, stroke suspected. EXAM: MRI HEAD WITHOUT CONTRAST TECHNIQUE: Multiplanar, multiecho pulse sequences of the brain and surrounding structures were obtained without intravenous contrast. COMPARISON:  Prior CT from earlier the same day as well as earlier studies. FINDINGS: Brain: Examination degraded by motion artifact. Generalized age-related cerebral atrophy. Patchy and confluent T2/FLAIR hyperintensity involving the periventricular deep white matter both cerebral hemispheres as well as the pons, most consistent with chronic small vessel ischemic disease. Few scatter remote lacunar infarcts noted about the deep gray nuclei. 2.3 cm acute ischemic nonhemorrhagic infarcts seen involving the left basal ganglia (series 5, image 83). No other evidence for acute or subacute ischemia. Gray-white matter differentiation otherwise maintained. No acute or chronic intracranial blood products. No mass lesion, midline shift or mass effect. Diffuse ventricular prominence related to global parenchymal volume  loss without hydrocephalus. No extra-axial fluid collection. Pituitary gland and suprasellar region within normal limits. Vascular: Major intracranial vascular flow voids are maintained. Skull and upper cervical spine: Craniocervical junction within normal limits. Bone marrow signal intensity normal. No scalp soft tissue abnormality. Sinuses/Orbits: Patient status post bilateral ocular lens replacement. Scattered mucosal thickening noted about the paranasal sinuses. No significant mastoid effusion. Other: None. IMPRESSION: 1. 2.3 cm acute ischemic nonhemorrhagic left basal ganglia infarct. 2. Underlying age-related cerebral atrophy with mild chronic small vessel ischemic disease. Electronically Signed   By: Jeannine Boga M.D.   On: 07/19/2022 23:54    CT HEAD WO CONTRAST  Result Date: 07/19/2022 CLINICAL DATA:  Mental status change, persistent or worsening EXAM: CT HEAD WITHOUT CONTRAST TECHNIQUE: Contiguous axial images were obtained from the base of the skull through the vertex without intravenous contrast. RADIATION DOSE REDUCTION: This exam was performed according to the departmental dose-optimization program which includes automated exposure control, adjustment of the mA and/or kV according to patient size and/or use of iterative reconstruction technique. COMPARISON:  CT head 07/09/2021. FINDINGS: Motion limited study. Brain: No evidence of acute large vascular territory infarct, acute hemorrhage, mass lesion, midline shift or hydrocephalus. Cerebral atrophy with ex vacuo ventricular dilation. Confluent and patchy hypodensities in the white matter, nonspecific but likely the sequela of chronic microvascular ischemic disease. Vascular: No hyperdense vessel identified. Calcific intracranial atherosclerosis. Skull: No acute fracture. Sinuses/Orbits: Largely clear sinuses.  No acute orbital findings. Other: No mastoid effusions. IMPRESSION: 1. No evidence of acute intracranial abnormality. 2. Similar chronic microvascular ischemic disease and cerebral atrophy (ICD10-G31.9). Electronically Signed   By: Margaretha Sheffield M.D.   On: 07/19/2022 15:52   DG Chest Port 1 View  Result Date: 07/19/2022 CLINICAL DATA:  Altered mental status. EXAM: PORTABLE CHEST 1 VIEW COMPARISON:  December 05, 2021. FINDINGS: Stable cardiomediastinal silhouette. Status post coronary bypass graft. Mild bibasilar subsegmental atelectasis or infiltrates are noted. Bony thorax is unremarkable. IMPRESSION: Mild bibasilar subsegmental atelectasis or infiltrates. Electronically Signed   By: Marijo Conception M.D.   On: 07/19/2022 14:38     PHYSICAL EXAM  Temp:  [97.6 F (36.4 C)-98.1 F (36.7 C)] 98.1 F (36.7 C) (09/21 2037) Pulse Rate:  [55-88] 77 (09/21 2037) Resp:  [12-18]  16 (09/21 2037) BP: (134-153)/(60-105) 134/64 (09/21 2037) SpO2:  [92 %-100 %] 99 % (09/21 2037)  General - Well nourished, well developed, in no apparent distress.  Ophthalmologic - fundi not visualized due to noncooperation.  Cardiovascular - irregular HR on tele   Neuro - awake, alert, eyes open, orientated to place only, not oriented to time, age, situation. No aphasia, paucity of speech, following all simple commands but significant psychomotor slowing. Able to name and repeat in delayed fashion.  Mild to moderate dysarthria.  No gaze palsy, tracking bilaterally, blinking to visual threat bilaterally. No facial droop. Tongue midline. Bilateral UEs 3/5, slow drift bilaterally. Bilaterally LEs withdraw to pain seems symmetrical. Sensation symmetrical bilaterally subjectively but slow to response, b/l FTN intact grossly, gait not tested.     ASSESSMENT/PLAN Mr. MELBURN TREIBER is a 87 y.o. male with history of CAD status post CABG, DM, hypertension, hyperlipidemia, PAD status post stents and femoral endarterectomy to BLE admitted for altered mental status for 2 days, not speaking much. No tPA given due to outside window.    Stroke:  left BG/CR infarct, likely secondary to large vessel disease given location and vascular stenosis CT no acute abnormality MRI left BG/CR infarct  CT head and neck left proximal M1 near occlusion with reconstitution of distal M1 and M2.  Right ICA 75%, left ICA 90% stenosis.  Left subclavian moderate stenosis proximal to left VA takeoff.  Bilateral ICA siphon severe atherosclerosis 2D Echo EF 60 to 65% LDL 104 HgbA1c 6.5 UDS negative Lovenox for VTE prophylaxis clopidogrel 75 mg daily prior to admission, now on aspirin 325 mg daily and clopidogrel 75 mg daily DAPT due to severe intracranial and extracranial stenosis.  Duration of regimen per [facility]VS. Ongoing aggressive stroke risk factor management Therapy recommendations: SNF Disposition: Pending  Severe  intracranial and extraluminal stenosis CT head and neck left proximal M1 near occlusion with reconstitution of distal M1 and M2.  Right ICA 75%, left ICA 90% stenosis.  Left subclavian moderate stenosis proximal to left VA takeoff.  Bilateral ICA siphon severe atherosclerosis Left ICA 90% stenosis and left M1 near occlusion likely to be the contributor for left BG/CR infarct this time Vascular surgery Dr. Carlis Abbott consulted Planning for TCAR on the left Continue DAPT  ?  A-fib EKG concerning for atrial arrhythmia Cardiology consulted Dr. Debara Pickett on board, difficult to confirm A-fib at this time.   Recommend continued telemetry monitoring and outpatient cardiac monitoring if negative for A-fib during admission  Diabetes HgbA1c 6.5 goal < 7.0 Controlled CBG monitoring SSI DM education and close PCP follow up  Hypertension Stable 130-150s Avoid low BP Long term BP goal 130-150 given severe intracranial and extracranial vascular stenosis  Hyperlipidemia Home meds: None LDL 104, goal < 70 Statin intolerance to Lipitor, Crestor and Zocor Now on Zetia Continue Zetia at discharge  Other Stroke Risk Factors Advanced age CAD status post CABG PAD status post stenting and femoral artery endarterectomy  Other Active Problems CXR showed pneumonia, on Rocephin and azithromycin  Hospital day # 1    Rosalin Hawking, MD PhD Stroke Neurology 07/21/2022 8:51 PM    To contact Stroke Continuity provider, please refer to [email or web address] After hours, contact General Neurology

## 2022-07-21 NOTE — [place]2 (Signed)
[place] MEDICAID FL2 LEVEL OF CARE SCREENING TOOL     IDENTIFICATION  Patient Name: [name] Birthdate: 05-29-1935 Sex: male Admission Date (Current Location): 07/19/2022  [facility] and [place] Number:  Herbalist and Address:  [facility], [place] [address] [place], [place] [address]      Provider Number: [id number]  Attending Physician Name and Address:  Elodia Florence., *  Relative Name and Phone Number:       Current Level of Care: Hospital Recommended Level of Care: [place] Prior Approval Number:    Date Approved/Denied:   PASRR Number: [id number]  Discharge Plan: SNF    Current Diagnoses: Patient Active Problem List   Diagnosis Date Noted   Stroke [facility]) 07/20/2022   Acute encephalopathy 07/19/2022   Pulmonary infiltrates 20/25/4270   Acute metabolic encephalopathy 62/37/6283   Mixed diabetic hyperlipidemia associated with type 2 diabetes mellitus ([place]) 07/10/2021   GERD without esophagitis 07/10/2021   Chronic kidney disease, stage 3a ([place]) 07/10/2021   Dementia without behavioral disturbance ([place]) 07/10/2021   Coronary artery disease involving native heart without angina pectoris 07/10/2021   Pneumonia of left lower lobe due to infectious organism 07/09/2021   Laceration of left index finger without foreign body without damage to nail 08/24/2017   Type 2 diabetes mellitus with stage 3a chronic kidney disease, without long-term current use of insulin ([place]) 11/04/2015   Left carotid bruit 11/04/2015   PVD with claudication - s/p PV surgery May 2016 03/23/2015   Intractable hiccups 02/16/2015   Dehydration 02/16/2015   Hyponatremia 02/16/2015   Perineal abscess 06/20/2013   Essential hypertension    Hx of CABG 2009    Hyperlipidemia    Chronic constipation 06/27/2012    Orientation RESPIRATION BLADDER Height & Weight     Self  Normal Incontinent Weight: 175 lb (79.4 kg) Height:  '6\' 2"'$  (188 cm)   BEHAVIORAL SYMPTOMS/MOOD NEUROLOGICAL BOWEL NUTRITION STATUS      Incontinent Diet (see DC summary)  AMBULATORY STATUS COMMUNICATION OF NEEDS Skin   Extensive Assist Verbally Normal                       Personal Care Assistance Level of Assistance  Bathing, Feeding, Dressing Bathing Assistance: Maximum assistance Feeding assistance: Maximum assistance Dressing Assistance: Maximum assistance     Functional Limitations Info             SPECIAL CARE FACTORS FREQUENCY  PT (By licensed PT), OT (By licensed OT), Speech therapy     PT Frequency: 5x/wk OT Frequency: 5x/wk     Speech Therapy Frequency: 5x/wk      Contractures Contractures Info: Not present    Additional Factors Info  Code Status, Allergies, Insulin Sliding Scale Code Status Info: Full Allergies Info: Atorvastatin, Rosuvastatin, Simvastatin, Sulfonamide Derivatives   Insulin Sliding Scale Info: see DC summary       Current Medications (07/21/2022):  This is the current hospital active medication list Current Facility-Administered Medications  Medication Dose Route Frequency Provider Last Rate Last Admin   acetaminophen (TYLENOL) tablet 650 mg  650 mg Oral Q6H PRN Orma Flaming, MD   650 mg at 07/21/22 1007   Or   acetaminophen (TYLENOL) suppository 650 mg  650 mg Rectal Q6H PRN Orma Flaming, MD       aspirin suppository 300 mg  300 mg Rectal Daily Elodia Florence., MD  Or   aspirin tablet 325 mg  325 mg Oral Daily Elodia Florence., MD   325 mg at 07/21/22 1006   azithromycin (ZITHROMAX) 500 mg in sodium chloride 0.9 % 250 mL IVPB  500 mg Intravenous Q24H Orma Flaming, MD   Stopped at 07/20/22 1909   cefTRIAXone (ROCEPHIN) 2 g in sodium chloride 0.9 % 100 mL IVPB  2 g Intravenous Q24H Orma Flaming, MD   Stopped at 07/20/22 1901   clopidogrel (PLAVIX) tablet 75 mg  75 mg Oral Q supper Orma Flaming, MD   75 mg at 07/20/22 1808   enoxaparin (LOVENOX) injection 40 mg  40 mg  Subcutaneous Q24H Orma Flaming, MD   40 mg at 07/20/22 1942   ezetimibe (ZETIA) tablet 10 mg  10 mg Oral Daily Rosalin Hawking, MD       insulin aspart (novoLOG) injection 0-9 Units  0-9 Units Subcutaneous TID WC Orma Flaming, MD   1 Units at 07/21/22 0729   pantoprazole (PROTONIX) EC tablet 40 mg  40 mg Oral QAC supper Orma Flaming, MD   40 mg at 07/20/22 1808     Discharge Medications: Please see discharge summary for a list of discharge medications.  Relevant Imaging Results:  Relevant Lab Results:   Additional Information SS#: [id number]  Geralynn Ochs, LCSW

## 2022-07-21 NOTE — Consult Note (Addendum)
Cardiology Consultation   Patient ID: JAIS DEMIR MRN: [id number]; DOB: [birth year]  Admit date: 07/19/2022 Date of Consult: 07/21/2022  PCP:  Ginger Organ., MD   [place] Providers Cardiologist:  None     Patient Profile:   JARIAH JARMON is a 87 y.o. male with a hx of CAD status post 31v CABG '09, hypertension, hyperlipidemia, diabetes, peripheral artery disease who is being seen 07/21/2022 for the evaluation of atrial fibrillation at the request of Dr. Florene Glen.  History of Present Illness:   Mr. Thrun is an 87 year old male with past medical history noted above.  He has been followed by Dr. Martinique as an outpatient.  He underwent three-vessel CABG (LIMA to LAD, SVG to first diagonal, SVG to OM with sequential SVG to PD/PL) in 2009 with Dr. Roxan Hockey.  Has a history of statin intolerance secondary to myalgias.  Also been intolerant to Zetia.  Seen by Dr. Donnetta Hutching in April 2016 and underwent bilateral femoral endarterectomy and patch angioplasty with bilateral iliac stenting.  Last seen in the office on 05/2019 with Dr. Martinique and reported he was in his usual state of health.  He was continued on aspirin, ACE inhibitor, metoprolol and metformin.  The topic of PCSK9 inhibitors were discussed but he was not interested.  Also discussed with him the DOAC acid which patient wished to research more.  Echocardiogram 02/2020 showed LVEF of 60 to 32%, grade 1 diastolic dysfunction, normal RV size and function mildly elevated pulmonary artery systolic pressure, mildly dilated left atrium, moderate to severe MR.   Presented to the ED on 9/19 with altered mental status which started 2 days prior.  It was noted by his caregiver that he had been lethargic and confused for the past 2 days.  Also felt that they noted left-sided facial droop. He ambulates independently with a walker but was in a wheelchair the two days prior to admission. At baseline he talks but very little and needs help with  most of his ADLs.   In the ED his labs showed sodium 134, potassium 4.9, creatinine 1.2, WBC 8.4, hemoglobin 13.2.  Chest x-ray showed possible lower lobe pneumonia.  CT head and neck with near occlusion of the proximal M1 segment of left MCA with reconstitution of the distal M1 and M2 segment.  Severe stenosis at the origin of bilateral internal carotid arteries.  CT brain negative for acute abnormality.  Brain MRI with 2.3 cm acute ischemic nonhemorrhagic left basal ganglia infarct.  He was evaluated by neurology with recommendations for echo, aspirin.  He was also evaluated by vascular surgery, and now tentatively planned for left TCAR on Monday.  Cardiology has been asked to evaluate in regards to his EKG tracings, as there is concern for atrial fibrillation.  Past Medical History:  Diagnosis Date   Arthritis    CAD (coronary artery disease)    Cataract    Diabetes mellitus    GERD (gastroesophageal reflux disease)    Helicobacter pylori gastritis 01/2006, 03/2011   EGD - Pylera Tx 2010   Hiccups    History of kidney stones    Hyperlipidemia    Hypertension    PAD (peripheral artery disease) [facility])     Past Surgical History:  Procedure Laterality Date   CATARACT EXTRACTION     right   COLONOSCOPY  2002, 2007, 06/09/2011   Dr. Evelina Bucy [name], [place]: for FHx colon cancer, no adenomas; 2012: [facility] - normal   CORONARY ARTERY  BYPASS GRAFT  05-07-2008   ENDARTERECTOMY FEMORAL Bilateral 03/23/2015   Procedure: ENDARTERECTOMY OF BILATERAL COMMON FEMORAL AND PROFUNDA ARTERIES;  Surgeon: Rosetta Posner, MD;  Location: [place];  Service: Vascular;  Laterality: Bilateral;   ESOPHAGOGASTRODUODENOSCOPY  02/20/06   Dr. Evelina Bucy, [name], [place]. pylori gastritis and GERD changes (pathology)   EYE SURGERY Left    cataract   HERNIA REPAIR  12/24/2010   INSERTION OF ILIAC STENT Bilateral 03/23/2015   Procedure: LEFT EXTERNAL ILIAC AND BILATERAL COMMON ILIAC STENT;  Surgeon: Rosetta Posner, MD;  Location:  [facility] OR;  Service: Vascular;  Laterality: Bilateral;   JOINT REPLACEMENT     right knee replacement   PATCH ANGIOPLASTY Bilateral 03/23/2015   Procedure: PATCH ANGIOPLASTY OF BILATERAL COMMON FEMORAL ARTERIES;  Surgeon: Rosetta Posner, MD;  Location: [place];  Service: Vascular;  Laterality: Bilateral;   PERIPHERAL VASCULAR CATHETERIZATION N/A 03/11/2015   Procedure: Abdominal Aortogram w/Lower Extremity;  Surgeon: Rosetta Posner, MD;  Location: [place] CV LAB;  Service: Cardiovascular;  Laterality: N/A;   TOTAL KNEE ARTHROPLASTY  07-13-2012   right   UPPER GASTROINTESTINAL ENDOSCOPY  03/09/2009   Dr. Carlean Purl: H. pylori gastritis (Pylera Tx) and 2 cm hiatal hernia     Home Medications:  Prior to Admission medications   Medication Sig Start Date End Date Taking? Authorizing Provider  acetaminophen (TYLENOL) 500 MG tablet Take 500 mg by mouth every 6 (six) hours as needed for moderate pain.   Yes [provider]  cholecalciferol (VITAMIN D3) 25 MCG (1000 UNIT) tablet Take 1,000 Units by mouth daily.   Yes [provider]  clopidogrel (PLAVIX) 75 MG tablet Take 75 mg by mouth daily with supper.   Yes [provider]  magnesium oxide (MAG-OX) 400 (240 Mg) MG tablet Take 1 tablet (400 mg total) by mouth daily. 07/14/21  Yes Eulogio Bear U, DO  metFORMIN (GLUCOPHAGE) 1000 MG tablet Take 1 tablet (1,000 mg total) by mouth daily with supper. Patient taking differently: Take 1,500 mg by mouth daily with supper. 07/13/21  Yes Eulogio Bear U, DO  metoprolol succinate (TOPROL-XL) 25 MG 24 hr tablet Take 25 mg by mouth daily with supper.   Yes [provider]  pantoprazole (PROTONIX) 40 MG tablet Take 40 mg by mouth daily before supper. 04/14/20  Yes [provider]  ramipril (ALTACE) 5 MG capsule Take 5 mg by mouth daily. 09/10/21  Yes [provider]  glucose blood (ONETOUCH VERIO) test strip Check sugars daily Dx: E11.49 09/11/18   [provider]   Misc. Devices [facility]) MISC Dx: Muscle Weakness 11/27/19   [provider]  OneTouch Delica Lancets 78G MISC Pt to check BS daily  Dx: E11.49 02/24/20   [provider]  polyethylene glycol (MIRALAX / GLYCOLAX) 17 g packet Take 17 g by mouth daily as needed for mild constipation. Patient not taking: Reported on 07/19/2022 07/13/21   Geradine Girt, DO    Inpatient Medications: Scheduled Meds:  aspirin  300 mg Rectal Daily   Or   aspirin  325 mg Oral Daily   clopidogrel  75 mg Oral Q supper   enoxaparin (LOVENOX) injection  40 mg Subcutaneous Q24H   ezetimibe  10 mg Oral Daily   insulin aspart  0-9 Units Subcutaneous TID WC   pantoprazole  40 mg Oral QAC supper   Continuous Infusions:  azithromycin (ZITHROMAX) 500 mg in sodium chloride 0.9 % 250 mL IVPB Stopped (07/20/22 1909)  cefTRIAXone (ROCEPHIN)  IV Stopped (07/20/22 1901)   PRN Meds: acetaminophen **OR** acetaminophen  Allergies:    Allergies  Allergen Reactions   Atorvastatin Other (See Comments)    Muscle pain   Rosuvastatin Other (See Comments)    Muscle pain   Simvastatin Other (See Comments)    Muscle pain   Sulfonamide Derivatives Hives    Social History:   Social History   Socioeconomic History   Marital status: Married    Spouse name: Not on file   Number of children: 2   Years of education: Not on file   Highest education level: Not on file  Occupational History   Occupation: Retired    Fish farm manager: RETIRED     Comment: sales  Tobacco Use   Smoking status: Former    Packs/day: 1.00    Years: 40.00    Total pack years: 40.00    Types: Cigarettes   Smokeless tobacco: Never  Vaping Use   Vaping Use: Never used  Substance and Sexual Activity   Alcohol use: Yes    Alcohol/week: 0.0 standard drinks of alcohol    Comment: 1 time per month   Drug use: No   Sexual activity: Not on file  Other Topics Concern   Not on file  Social History Narrative   Lives at home with wife     Right handed   Caffeine: 5 cups coffee per day    Social Determinants of Health   Financial Resource Strain: Not on file  Food Insecurity: Not on file  Transportation Needs: Not on file  Physical Activity: Not on file  Stress: Not on file  Social Connections: Not on file  Intimate Partner Violence: Not on file    Family History:    Family History  Problem Relation Age of Onset   Heart disease Father    Diabetes Father    Heart attack Father    Hypertension Father    Varicose Veins Father    Cancer Sister        colon   Diabetes Sister    Other Sister        varicose veins   Heart attack Sister    Heart disease Sister    Hyperlipidemia Sister    Hypertension Sister    Varicose Veins Sister    Cancer Brother        prostate   Diabetes Brother    Heart disease Brother    Hyperlipidemia Brother    Hypertension Brother    Other Brother        varicose veins   Heart attack Brother    Varicose Veins Brother    Cancer Sister        lukemia   Myasthenia gravis Sister    Colon cancer Sister    Leukemia Sister    Heart attack Brother    Crohn's disease Son      ROS:  Please see the history of present illness.   All other ROS reviewed and negative.     Physical Exam/Data:   Vitals:   07/20/22 2319 07/21/22 0015 07/21/22 0351 07/21/22 1100  BP: (!) 152/105 (!) 153/92 (!) 152/85 (!) 150/74  Pulse: 75  88 85  Resp: '18  17 17  '$ Temp: 97.7 F (36.5 C)  97.6 F (36.4 C) 98 F (36.7 C)  TempSrc: Axillary  Axillary Axillary  SpO2: 99%  92% 98%  Weight:      Height:  Intake/Output Summary (Last 24 hours) at 07/21/2022 1406 Last data filed at 07/21/2022 0500 Gross per 24 hour  Intake --  Output 400 ml  Net -400 ml      07/19/2022    5:53 PM 12/05/2021   10:00 AM 09/20/2021   10:14 AM  Last 3 Weights  Weight (lbs) 175 lb 190 lb 192 lb  Weight (kg) 79.379 kg 86.183 kg 87.091 kg     Body mass index is 22.47 kg/m.  General:  Well nourished, well  developed, in no acute distress. Alert, nods when asked questions, offers few verbal answers HEENT: normal Neck: no JVD Vascular: No carotid bruits; Distal pulses 2+ bilaterally Cardiac:  normal S1, S2; RRR; + systolic murmur  Lungs:  clear to auscultation bilaterally, no wheezing, rhonchi or rales  Abd: soft, nontender, no hepatomegaly  Ext: no edema Musculoskeletal:  No deformities, BUE and BLE strength normal and equal Skin: warm and dry    EKG:  The EKG was personally reviewed and demonstrates:  9/19 1338 baseline artifact, 72 bpm, difficult interpretation, narrow complex but irregular  9/19 1340 narrow complex, 58bpm, baseline artifact but appears to have discernable p waves  9/19 1723 narrow complex, 73bpm, irregular, again baseline artifact making interpretation difficult  9/21 0447 Sinus Rhythm 80 bpm, PACs      Telemetry:  Telemetry was personally reviewed and demonstrates: starting from 9/20 appears mostly in sinus rhythm with PACs, one episode of narrow complex tachycardia suspect SVT  Relevant CV Studies:  Echo: 07/20/22  IMPRESSIONS     1. Left ventricular ejection fraction, by estimation, is 60 to 65%. The  left ventricle has normal function. The left ventricle has no regional  wall motion abnormalities. Left ventricular diastolic function could not  be evaluated.   2. Right ventricular systolic function was not well visualized. The right  ventricular size is normal.   3. Left atrial size was mildly dilated.   4. The mitral valve is abnormal. Not well visualized mitral valve  regurgitation. No evidence of mitral stenosis.   5. The aortic valve is tricuspid. There is moderate calcification of the  aortic valve. There is mild thickening of the aortic valve. Aortic valve  regurgitation is trivial. Aortic valve sclerosis/calcification is present,  without any evidence of aortic  stenosis.   Comparison(s): Changes from prior study are noted. Mitral regurgitation   noted as moderate to severe on prior study. Not well visualized on current  study.   Conclusion(s)/Recommendation(s): Otherwise normal echocardiogram, with  minor abnormalities described in the report. No intracardiac source of  embolism detected on this transthoracic study. Consider a transesophageal  echocardiogram to exclude cardiac  source of embolism if clinically indicated.   FINDINGS   Left Ventricle: Left ventricular ejection fraction, by estimation, is 60  to 65%. The left ventricle has normal function. The left ventricle has no  regional wall motion abnormalities. The left ventricular internal cavity  size was normal in size. There is   no left ventricular hypertrophy. Left ventricular diastolic function  could not be evaluated due to atrial fibrillation. Left ventricular  diastolic function could not be evaluated.   Right Ventricle: The right ventricular size is normal. Right vetricular  wall thickness was not well visualized. Right ventricular systolic  function was not well visualized.   Left Atrium: Left atrial size was mildly dilated.   Right Atrium: Right atrial size was normal in size.   Pericardium: There is no evidence of pericardial effusion.   Mitral  Valve: Calcified subvalvular apparatus. The mitral valve is  abnormal. There is mild thickening of the mitral valve leaflet(s). There  is moderate calcification of the mitral valve leaflet(s). Not well  visualized mitral valve regurgitation. No  evidence of mitral valve stenosis.   Tricuspid Valve: The tricuspid valve is normal in structure. Tricuspid  valve regurgitation is mild . No evidence of tricuspid stenosis.   Aortic Valve: The aortic valve is tricuspid. There is moderate  calcification of the aortic valve. There is mild thickening of the aortic  valve. Aortic valve regurgitation is trivial. Aortic valve  sclerosis/calcification is present, without any evidence  of aortic stenosis. Aortic valve mean  gradient measures 3.0 mmHg. Aortic  valve peak gradient measures 5.0 mmHg. Aortic valve area, by VTI measures  2.22 cm.   Pulmonic Valve: The pulmonic valve was not well visualized. Pulmonic valve  regurgitation is not visualized.   Aorta: The ascending aorta was not well visualized.   Venous: The inferior vena cava was not well visualized.   IAS/Shunts: The interatrial septum was not well visualized.   Laboratory Data:  High Sensitivity Troponin:  No results for input(s): "TROPONINIHS" in the last 720 hours.   Chemistry Recent Labs  Lab 07/19/22 1504 07/19/22 1537 07/20/22 0335 07/21/22 0338  NA 134* 133* 138 134*  K 4.9 5.9* 4.2 3.6  CL 101  --  105 104  CO2 24  --  24 21*  GLUCOSE 186*  --  105* 111*  BUN 33*  --  25* 19  CREATININE 1.29*  --  1.10 0.91  CALCIUM 9.3  --  9.2 8.7*  MG  --   --   --  1.4*  GFRNONAA 54*  --  >60 >60  ANIONGAP 9  --  9 9    Recent Labs  Lab 07/19/22 1504 07/21/22 0338  PROT 6.9 6.1*  ALBUMIN 3.6 3.2*  AST 25 19  ALT 10 9  ALKPHOS 74 68  BILITOT 0.8 0.5   Lipids  Recent Labs  Lab 07/21/22 0338  CHOL 149  TRIG 71  HDL 31*  LDLCALC 104*  CHOLHDL 4.8    Hematology Recent Labs  Lab 07/19/22 1504 07/19/22 1537 07/20/22 0335 07/21/22 0338  WBC 8.4  --  8.0 7.9  RBC 4.39  --  4.11* 3.99*  HGB 13.2 13.3 12.5* 12.1*  HCT 39.7 39.0 36.4* 34.2*  MCV 90.4  --  88.6 85.7  MCH 30.1  --  30.4 30.3  MCHC 33.2  --  34.3 35.4  RDW 13.2  --  13.1 12.8  PLT 208  --  171 176   Thyroid  Recent Labs  Lab 07/19/22 1754  TSH 1.473    BNPNo results for input(s): "BNP", "PROBNP" in the last 168 hours.  DDimer No results for input(s): "DDIMER" in the last 168 hours.   Radiology/Studies:  ECHOCARDIOGRAM COMPLETE  Result Date: 07/20/2022    ECHOCARDIOGRAM REPORT   Patient Name:   Joram Loletha Grayer Petta Date of Exam: 07/20/2022 Medical Rec #:  [id number]    Height:       74.0 in Accession #:    [id number]   Weight:       175.0 lb Date of  Birth:  [birth year]     BSA:          2.054 m Patient Age:    41 years     BP:           144/64 mmHg Patient Gender:  M            HR:           53 bpm. Exam Location:  Inpatient Procedure: 2D Echo, Color Doppler and Cardiac Doppler Indications:    Stroke  History:        Patient has prior history of Echocardiogram examinations, most                 recent 03/03/2020. CAD; Risk Factors:Diabetes, Dyslipidemia and                 Hypertension.  Sonographer:    Memory Argue Referring Phys: [phone] A CALDWELL [place]  1. Left ventricular ejection fraction, by estimation, is 60 to 65%. The left ventricle has normal function. The left ventricle has no regional wall motion abnormalities. Left ventricular diastolic function could not be evaluated.  2. Right ventricular systolic function was not well visualized. The right ventricular size is normal.  3. Left atrial size was mildly dilated.  4. The mitral valve is abnormal. Not well visualized mitral valve regurgitation. No evidence of mitral stenosis.  5. The aortic valve is tricuspid. There is moderate calcification of the aortic valve. There is mild thickening of the aortic valve. Aortic valve regurgitation is trivial. Aortic valve sclerosis/calcification is present, without any evidence of aortic stenosis. Comparison(s): Changes from prior study are noted. Mitral regurgitation noted as moderate to severe on prior study. Not well visualized on current study. Conclusion(s)/Recommendation(s): Otherwise normal echocardiogram, with minor abnormalities described in the report. No intracardiac source of embolism detected on this transthoracic study. Consider a transesophageal echocardiogram to exclude cardiac source of embolism if clinically indicated. FINDINGS  Left Ventricle: Left ventricular ejection fraction, by estimation, is 60 to 65%. The left ventricle has normal function. The left ventricle has no regional wall motion abnormalities. The left ventricular  internal cavity size was normal in size. There is  no left ventricular hypertrophy. Left ventricular diastolic function could not be evaluated due to atrial fibrillation. Left ventricular diastolic function could not be evaluated. Right Ventricle: The right ventricular size is normal. Right vetricular wall thickness was not well visualized. Right ventricular systolic function was not well visualized. Left Atrium: Left atrial size was mildly dilated. Right Atrium: Right atrial size was normal in size. Pericardium: There is no evidence of pericardial effusion. Mitral Valve: Calcified subvalvular apparatus. The mitral valve is abnormal. There is mild thickening of the mitral valve leaflet(s). There is moderate calcification of the mitral valve leaflet(s). Not well visualized mitral valve regurgitation. No evidence of mitral valve stenosis. Tricuspid Valve: The tricuspid valve is normal in structure. Tricuspid valve regurgitation is mild . No evidence of tricuspid stenosis. Aortic Valve: The aortic valve is tricuspid. There is moderate calcification of the aortic valve. There is mild thickening of the aortic valve. Aortic valve regurgitation is trivial. Aortic valve sclerosis/calcification is present, without any evidence of aortic stenosis. Aortic valve mean gradient measures 3.0 mmHg. Aortic valve peak gradient measures 5.0 mmHg. Aortic valve area, by VTI measures 2.22 cm. Pulmonic Valve: The pulmonic valve was not well visualized. Pulmonic valve regurgitation is not visualized. Aorta: The ascending aorta was not well visualized. Venous: The inferior vena cava was not well visualized. IAS/Shunts: The interatrial septum was not well visualized.  LEFT VENTRICLE PLAX 2D LVIDd:         4.60 cm   Diastology LVIDs:         3.20 cm   LV e' medial:  6.31 cm/s LV PW:         1.00 cm   LV E/e' medial:  18.7 LV IVS:        1.00 cm   LV e' lateral:   6.64 cm/s LVOT diam:     2.20 cm   LV E/e' lateral: 17.8 LV SV:         62 LV  SV Index:   30 LVOT Area:     3.80 cm  RIGHT VENTRICLE RV S prime:     7.83 cm/s LEFT ATRIUM             Index        RIGHT ATRIUM           Index LA diam:        3.20 cm 1.56 cm/m   RA Area:     11.50 cm LA Vol (A2C):   44.6 ml 21.72 ml/m  RA Volume:   20.80 ml  10.13 ml/m LA Vol (A4C):   68.4 ml 33.31 ml/m LA Biplane Vol: 56.0 ml 27.27 ml/m  AORTIC VALVE AV Area (Vmax):    2.37 cm AV Area (Vmean):   2.20 cm AV Area (VTI):     2.22 cm AV Vmax:           112.00 cm/s AV Vmean:          74.900 cm/s AV VTI:            0.281 m AV Peak Grad:      5.0 mmHg AV Mean Grad:      3.0 mmHg LVOT Vmax:         69.70 cm/s LVOT Vmean:        43.300 cm/s LVOT VTI:          0.164 m LVOT/AV VTI ratio: 0.58  AORTA Ao Root diam: 2.60 cm MITRAL VALVE                TRICUSPID VALVE MV Area (PHT): 3.53 cm     TR Peak grad:   20.8 mmHg MV Decel Time: 215 msec     TR Vmax:        228.00 cm/s MR Peak grad: 61.3 mmHg MR Vmax:      391.33 cm/s   SHUNTS MV E velocity: 118.00 cm/s  Systemic VTI:  0.16 m MV A velocity: 120.00 cm/s  Systemic Diam: 2.20 cm MV E/A ratio:  0.98 Buford Dresser MD Electronically signed by Buford Dresser MD Signature Date/Time: 07/20/2022/6:24:41 PM    Final    CT ANGIO HEAD NECK W WO CM  Result Date: 07/20/2022 CLINICAL DATA:  Stroke follow-up EXAM: CT ANGIOGRAPHY HEAD AND NECK TECHNIQUE: Multidetector CT imaging of the head and neck was performed using the standard protocol during bolus administration of intravenous contrast. Multiplanar CT image reconstructions and MIPs were obtained to evaluate the vascular anatomy. Carotid stenosis measurements (when applicable) are obtained utilizing NASCET criteria, using the distal internal carotid diameter as the denominator. RADIATION DOSE REDUCTION: This exam was performed according to the departmental dose-optimization program which includes automated exposure control, adjustment of the mA and/or kV according to patient size and/or use of iterative  reconstruction technique. CONTRAST:  36m OMNIPAQUE IOHEXOL 350 MG/ML SOLN COMPARISON:  MRI head 07/19/2022, CT head 07/19/2022 FINDINGS: Limitations: Assessment is slightly limited due to the degree of motion artifact. Within this limitation CT HEAD FINDINGS Brain: Redemonstrated severe chronic microvascular ischemic change and advanced generalized volume loss. Redemonstrated hypodensity in  the left basal ganglia in the region of known infarction. Unchanged size and shape of the ventricular system. No evidence of intracranial hemorrhage. Vascular: See below for vascular findings. Skull: Normal. Negative for fracture or focal lesion. Sinuses/Orbits: Bilateral lens replacement.  Sinuses are clear. Other: None. Review of the MIP images confirms the above findings CTA NECK FINDINGS Aortic arch: Standard branching. Imaged portion shows no evidence of aneurysm or dissection. No significant stenosis of the major arch vessel origins. Right carotid system: There is scattered atherosclerotic plaque in the right common carotid artery. There is severe greater than 75% stenosis at the origin of the right internal carotid artery (series 15, image 212) Left carotid system: There is scattered atherosclerotic plaque in the left common carotid artery. There is severe (approximate 90% stenosis at the origin of the left internal carotid artery (series 15, image 217). Vertebral arteries: There is mild stenosis of the origin of the right vertebral artery. There is mild stenosis in the distal V2 segment of the left vertebral artery (series 15, image 149) Skeleton: Negative. There is likely at least moderate spinal canal stenosis at C4-C5. There is a degenerative pannus at C1-C2. Other neck: Negative. Upper chest: There is severe centrilobular emphysema. Review of the MIP images confirms the above findings CTA HEAD FINDINGS Anterior circulation: There is near occlusion of the proximal M1 segment of the left MCA (series 15, image 90). The  distal M1 and M2 segments are contrast opacified. Assessment of the A2 and A3 segments of the ACA is limited due to motion artifact. Posterior circulation: Assessment of the P2 and P3 segments of the bilateral PCAs is limited due to motion artifact. No significant stenosis, proximal occlusion, aneurysm, or vascular malformation. Venous sinuses: As permitted by contrast timing, patent. Anatomic variants: None Review of the MIP images confirms the above findings IMPRESSION: 1. Near occlusion of the proximal M1 segment of the left MCA with reconstitution of the distal M1 and M2 segments. 2. Severe stenosis at the origin of bilateral internal carotid arteries (greater than 75% on the right and approximately 90% on the left). 3. Redemonstrated hypodensities in the left basal ganglia, in keeping with patient's history of left basal ganglia infarct. No evidence of intracranial hemorrhage. 4. There is atherosclerotic plaque in the left subclavian artery resulting in moderate stenosis, proximal to the takeoff of the left vertebral artery. Electronically Signed   By: Marin  M.D.   On: 07/20/2022 09:22   MR BRAIN WO CONTRAST  Result Date: 07/19/2022 CLINICAL DATA:  Initial evaluation for neuro deficit, stroke suspected. EXAM: MRI HEAD WITHOUT CONTRAST TECHNIQUE: Multiplanar, multiecho pulse sequences of the brain and surrounding structures were obtained without intravenous contrast. COMPARISON:  Prior CT from earlier the same day as well as earlier studies. FINDINGS: Brain: Examination degraded by motion artifact. Generalized age-related cerebral atrophy. Patchy and confluent T2/FLAIR hyperintensity involving the periventricular deep white matter both cerebral hemispheres as well as the pons, most consistent with chronic small vessel ischemic disease. Few scatter remote lacunar infarcts noted about the deep gray nuclei. 2.3 cm acute ischemic nonhemorrhagic infarcts seen involving the left basal ganglia (series 5,  image 83). No other evidence for acute or subacute ischemia. Gray-white matter differentiation otherwise maintained. No acute or chronic intracranial blood products. No mass lesion, midline shift or mass effect. Diffuse ventricular prominence related to global parenchymal volume loss without hydrocephalus. No extra-axial fluid collection. Pituitary gland and suprasellar region within normal limits. Vascular: Major intracranial vascular flow voids are maintained.  Skull and upper cervical spine: Craniocervical junction within normal limits. Bone marrow signal intensity normal. No scalp soft tissue abnormality. Sinuses/Orbits: Patient status post bilateral ocular lens replacement. Scattered mucosal thickening noted about the paranasal sinuses. No significant mastoid effusion. Other: None. IMPRESSION: 1. 2.3 cm acute ischemic nonhemorrhagic left basal ganglia infarct. 2. Underlying age-related cerebral atrophy with mild chronic small vessel ischemic disease. Electronically Signed   By: Jeannine Boga M.D.   On: 07/19/2022 23:54   CT HEAD WO CONTRAST  Result Date: 07/19/2022 CLINICAL DATA:  Mental status change, persistent or worsening EXAM: CT HEAD WITHOUT CONTRAST TECHNIQUE: Contiguous axial images were obtained from the base of the skull through the vertex without intravenous contrast. RADIATION DOSE REDUCTION: This exam was performed according to the departmental dose-optimization program which includes automated exposure control, adjustment of the mA and/or kV according to patient size and/or use of iterative reconstruction technique. COMPARISON:  CT head 07/09/2021. FINDINGS: Motion limited study. Brain: No evidence of acute large vascular territory infarct, acute hemorrhage, mass lesion, midline shift or hydrocephalus. Cerebral atrophy with ex vacuo ventricular dilation. Confluent and patchy hypodensities in the white matter, nonspecific but likely the sequela of chronic microvascular ischemic disease.  Vascular: No hyperdense vessel identified. Calcific intracranial atherosclerosis. Skull: No acute fracture. Sinuses/Orbits: Largely clear sinuses.  No acute orbital findings. Other: No mastoid effusions. IMPRESSION: 1. No evidence of acute intracranial abnormality. 2. Similar chronic microvascular ischemic disease and cerebral atrophy (ICD10-G31.9). Electronically Signed   By: Margaretha Sheffield M.D.   On: 07/19/2022 15:52   DG Chest Port 1 View  Result Date: 07/19/2022 CLINICAL DATA:  Altered mental status. EXAM: PORTABLE CHEST 1 VIEW COMPARISON:  December 05, 2021. FINDINGS: Stable cardiomediastinal silhouette. Status post coronary bypass graft. Mild bibasilar subsegmental atelectasis or infiltrates are noted. Bony thorax is unremarkable. IMPRESSION: Mild bibasilar subsegmental atelectasis or infiltrates. Electronically Signed   By: Marijo Conception M.D.   On: 07/19/2022 14:38     Assessment and Plan:   JEWELZ KOBUS is a 87 y.o. male with a hx of CAD status post 32v CABG '09, hypertension, hyperlipidemia, diabetes, peripheral artery disease who is being seen 07/21/2022 for the evaluation of atrial fibrillation at the request of Dr. Florene Glen.  Narrow complex irregular rhythm  -- serial EKGs (noted above) on admission with significant baseline artifact making definitive interpretation difficult. With that said several tracings are suspicious for afib, therefore unable to fully rule out afib. In review of telemetry, he appears mostly in sinus rhythm with PACs, one episode of narrow complex, regular tachycardia more consistent with SVT.  -- ChadsVasc of 7 -- would continue to monitor on telemetry, if no definitive Afib noted then plan for outpatient cardiac monitor  CVA Left Basal Ganglia Infarct -- MRI brain with 2.3 cm acute ischemic nonhemorrhagic left basal ganglia infarct.  -- now on ASA, plavix, has been intolerant to statins  CAD s/p CABG -- no reports anginal symptoms  -- continue ASA, plavix,  intolerant to statins  Symptomatic carotid Artery Stenosis -- CT neck with severe stenosis of bilateral proximal internal carotid arteries greater than 80%.  Seen by [facility] who discussed case with neurology who feels that his stroke could be from small vessel disease or his carotid artery disease. -- Plan for left TCAR on Monday  Moderate to Severe MR -- note don echo 02/2020, unable to well visualize on echo this admission  Per Primary Dementia GERD DM  Risk Assessment/Risk Scores:  \ For questions or  updates, please contact [place] Please consult [email or web address] for contact info under    Signed, Reino Bellis, NP  07/21/2022 2:06 PM

## 2022-07-22 ENCOUNTER — Ambulatory Visit: Payer: Medicare Other | Admitting: Podiatry

## 2022-07-22 DIAGNOSIS — I2510 Atherosclerotic heart disease of native coronary artery without angina pectoris: Secondary | ICD-10-CM | POA: Diagnosis not present

## 2022-07-22 DIAGNOSIS — I6522 Occlusion and stenosis of left carotid artery: Secondary | ICD-10-CM | POA: Diagnosis not present

## 2022-07-22 DIAGNOSIS — I6523 Occlusion and stenosis of bilateral carotid arteries: Secondary | ICD-10-CM | POA: Diagnosis not present

## 2022-07-22 DIAGNOSIS — G9340 Encephalopathy, unspecified: Secondary | ICD-10-CM | POA: Diagnosis not present

## 2022-07-22 DIAGNOSIS — F0390 Unspecified dementia without behavioral disturbance: Secondary | ICD-10-CM | POA: Diagnosis not present

## 2022-07-22 DIAGNOSIS — I63512 Cerebral infarction due to unspecified occlusion or stenosis of left middle cerebral artery: Secondary | ICD-10-CM | POA: Diagnosis not present

## 2022-07-22 LAB — CBC WITH DIFFERENTIAL/PLATELET
Abs Immature Granulocytes: 0.03 10*3/uL (ref 0.00–0.07)
Basophils Absolute: 0.1 10*3/uL (ref 0.0–0.1)
Basophils Relative: 1 %
Eosinophils Absolute: 0.3 10*3/uL (ref 0.0–0.5)
Eosinophils Relative: 4 %
HCT: 36.6 % — ABNORMAL LOW (ref 39.0–52.0)
Hemoglobin: 13 g/dL (ref 13.0–17.0)
Immature Granulocytes: 0 %
Lymphocytes Relative: 29 %
Lymphs Abs: 2.5 10*3/uL (ref 0.7–4.0)
MCH: 30.4 pg (ref 26.0–34.0)
MCHC: 35.5 g/dL (ref 30.0–36.0)
MCV: 85.7 fL (ref 80.0–100.0)
Monocytes Absolute: 0.7 10*3/uL (ref 0.1–1.0)
Monocytes Relative: 8 %
Neutro Abs: 5 10*3/uL (ref 1.7–7.7)
Neutrophils Relative %: 58 %
Platelets: 195 10*3/uL (ref 150–400)
RBC: 4.27 MIL/uL (ref 4.22–5.81)
RDW: 13.1 % (ref 11.5–15.5)
WBC: 8.6 10*3/uL (ref 4.0–10.5)
nRBC: 0 % (ref 0.0–0.2)

## 2022-07-22 LAB — COMPREHENSIVE METABOLIC PANEL
ALT: 11 U/L (ref 0–44)
AST: 20 U/L (ref 15–41)
Albumin: 3.5 g/dL (ref 3.5–5.0)
Alkaline Phosphatase: 70 U/L (ref 38–126)
Anion gap: 11 (ref 5–15)
BUN: 17 mg/dL (ref 8–23)
CO2: 24 mmol/L (ref 22–32)
Calcium: 9.4 mg/dL (ref 8.9–10.3)
Chloride: 100 mmol/L (ref 98–111)
Creatinine, Ser: 1.09 mg/dL (ref 0.61–1.24)
GFR, Estimated: >60 mL/min (ref >60)
Glucose, Bld: 112 mg/dL — ABNORMAL HIGH (ref 70–99)
Potassium: 4.3 mmol/L (ref 3.5–5.1)
Sodium: 135 mmol/L (ref 135–145)
Total Bilirubin: 0.9 mg/dL (ref 0.3–1.2)
Total Protein: 6.7 g/dL (ref 6.5–8.1)

## 2022-07-22 LAB — MAGNESIUM: Magnesium: 1.8 mg/dL (ref 1.7–2.4)

## 2022-07-22 LAB — GLUCOSE, CAPILLARY
Glucose-Capillary: 107 mg/dL — ABNORMAL HIGH (ref 70–99)
Glucose-Capillary: 164 mg/dL — ABNORMAL HIGH (ref 70–99)
Glucose-Capillary: 114 mg/dL — ABNORMAL HIGH (ref 70–99)
Glucose-Capillary: 139 mg/dL — ABNORMAL HIGH (ref 70–99)

## 2022-07-22 LAB — LEGIONELLA PNEUMOPHILA SEROGP 1 UR AG: L. pneumophila Serogp 1 Ur Ag: NEGATIVE

## 2022-07-22 LAB — PHOSPHORUS: Phosphorus: 3.5 mg/dL (ref 2.5–4.6)

## 2022-07-22 MED ORDER — METOPROLOL SUCCINATE ER 25 MG PO TB24: 25.0000 mg | ORAL_TABLET | Freq: Every day | ORAL | Status: DC

## 2022-07-22 MED ORDER — MAGNESIUM SULFATE 2 GM/50ML IV SOLN
2.0000 g | Freq: Once | INTRAVENOUS | Status: AC
  Administered 2022-07-22: 2 g via INTRAVENOUS
  Filled 2022-07-22: qty 50

## 2022-07-22 MED ORDER — METOPROLOL SUCCINATE ER 25 MG PO TB24
12.5000 mg | ORAL_TABLET | Freq: Every day | ORAL | Status: DC
  Administered 2022-07-22 – 2022-07-27 (×5): 12.5 mg via ORAL
  Filled 2022-07-22 (×5): qty 1

## 2022-07-22 NOTE — TOC Progression Note (Signed)
Transition of Care [facility]) - Progression Note    Patient Details  Name: [name] MRN: [id number] Date of Birth: [birth year]  Transition of [facility]) CM/SW [place], Wanda Phone Number: 07/22/2022, 1:37 PM  Clinical Narrative:   CSW updated by [facility] that they will have a bed available for patient when he's ready for discharge. Noting per chart review that TCAR is planned for Monday. CSW met with spouse at bedside to provide update, she is appreciative. CSW to follow.    Expected Discharge Plan: [place] Barriers to Discharge: Continued Medical Work up  Expected Discharge Plan and Services Expected Discharge Plan: [place] Choice: [place] arrangements for the past 2 months: Single Family Home                                       Social Determinants of Health (SDOH) Interventions    Readmission Risk Interventions     No data to display

## 2022-07-22 NOTE — TOC Initial Note (Signed)
Transition of Care [facility]) - Initial/Assessment Note    Patient Details  Name: [name] MRN: [id number] Date of Birth: 21-Jan-1935  Transition of Care [facility]) CM/SW Contact:    Baldemar Lenis, LCSW Phone Number: 07/22/2022, 1:36 PM  Clinical Narrative:           CSW met with patient's spouse and son at bedside to discuss recommendation for SNF and provide bed offers. Family in agreement with SNF, requesting [address] or [place]. CSW reached out to [facility], they will review referral. CSW to follow.        Expected Discharge Plan: Skilled Nursing Facility Barriers to Discharge: Continued Medical Work up   Patient Goals and CMS Choice Patient states their goals for this hospitalization and ongoing recovery are:: patient unable to participate in goal setting, not fully oriented CMS Medicare.gov Compare Post Acute Care list provided to:: Patient Represenative (must comment) Choice offered to / list presented to : Spouse  Expected Discharge Plan and Services Expected Discharge Plan: Skilled Nursing Facility     Post Acute Care Choice: Skilled Nursing Facility Living arrangements for the past 2 months: Single Family Home                                      Prior Living Arrangements/Services Living arrangements for the past 2 months: Single Family Home Lives with:: Spouse Patient language and need for interpreter reviewed:: No Do you feel safe going back to the place where you live?: Yes      Need for Family Participation in Patient Care: Yes (Comment) Care giver support system in place?: Yes (comment) Current home services: [facility] Criminal Activity/Legal Involvement Pertinent to Current Situation/Hospitalization: No - Comment as needed  Activities of Daily Living   ADL Screening (condition at time of admission) Patient's cognitive ability adequate to safely complete daily activities?: No Is the patient deaf or have difficulty hearing?: No Does the  patient have difficulty seeing, even when wearing glasses/contacts?: No Does the patient have difficulty concentrating, remembering, or making decisions?: Yes Patient able to express need for assistance with ADLs?: Yes Does the patient have difficulty dressing or bathing?: Yes Independently performs ADLs?: No Communication: Independent  Permission Sought/Granted Permission sought to share information with : Facility Medical sales representative, Family Supports Permission granted to share information with : Yes, Verbal Permission Granted  Share Information with NAME: Domingo Dimes  Permission granted to share info w AGENCY: SNF  Permission granted to share info w Relationship: Spouse, Son     Emotional Assessment Appearance:: Appears stated age Attitude/Demeanor/Rapport: Unable to Assess Affect (typically observed): Unable to Assess Orientation: : Oriented to Self Alcohol / Substance Use: Not Applicable Psych Involvement: No (comment)  Admission diagnosis:  Stroke [facility]) [I63.9] Acute encephalopathy [G93.40] Community acquired pneumonia, unspecified laterality [J18.9] Patient Active Problem List   Diagnosis Date Noted   Stroke (HCC) 07/20/2022   Acute encephalopathy 07/19/2022   Pulmonary infiltrates 07/19/2022   Acute metabolic encephalopathy 07/10/2021   Mixed diabetic hyperlipidemia associated with type 2 diabetes mellitus (HCC) 07/10/2021   GERD without esophagitis 07/10/2021   Chronic kidney disease, stage 3a (HCC) 07/10/2021   Dementia without behavioral disturbance (HCC) 07/10/2021   Coronary artery disease involving native heart without angina pectoris 07/10/2021   Pneumonia of left lower lobe due to infectious organism 07/09/2021   Laceration of left index finger without foreign body without damage to nail 08/24/2017  Type 2 diabetes mellitus with stage 3a chronic kidney disease, without long-term current use of insulin ([place]) 11/04/2015   Left carotid bruit 11/04/2015   PVD  with claudication - s/p PV surgery May 2016 03/23/2015   Intractable hiccups 02/16/2015   Dehydration 02/16/2015   Hyponatremia 02/16/2015   Perineal abscess 06/20/2013   Essential hypertension    Hx of CABG 2009    Hyperlipidemia    Chronic constipation 06/27/2012   PCP:  Ginger Organ., MD Pharmacy:   [id number] - [place], [place] - [address]. AT [address]. [place] [address] Phone: [phone] Fax: [phone]     Social Determinants of Health (SDOH) Interventions    Readmission Risk Interventions     No data to display

## 2022-07-22 NOTE — Progress Notes (Signed)
Rounding Note    Patient Name: [name] Date of Encounter: 07/22/2022  [place] Cardiologist: Peter Martinique, MD   Subjective   Somewhat confused however son reports this is his baseline.  No chest pain.  Inpatient Medications    Scheduled Meds:  aspirin  300 mg Rectal Daily   Or   aspirin  325 mg Oral Daily   clopidogrel  75 mg Oral Q supper   vitamin B-12  1,000 mcg Oral Daily   enoxaparin (LOVENOX) injection  40 mg Subcutaneous Q24H   ezetimibe  10 mg Oral Daily   insulin aspart  0-9 Units Subcutaneous TID WC   pantoprazole  40 mg Oral QAC supper   Continuous Infusions:  azithromycin (ZITHROMAX) 500 mg in sodium chloride 0.9 % 250 mL IVPB Stopped (07/21/22 1643)   cefTRIAXone (ROCEPHIN)  IV Stopped (07/21/22 1723)   PRN Meds: acetaminophen **OR** acetaminophen   Vital Signs    Vitals:   07/21/22 2037 07/21/22 2353 07/22/22 0443 07/22/22 0837  BP: 134/64 (!) 155/98 (!) 159/73 (!) 162/83  Pulse: 77 91 73 75  Resp: '16 17 17 18  '$ Temp: 98.1 F (36.7 C) 97.6 F (36.4 C) 97.6 F (36.4 C) 98.4 F (36.9 C)  TempSrc: Oral Oral Oral Oral  SpO2: 99% 92% 100% 100%  Weight:      Height:        Intake/Output Summary (Last 24 hours) at 07/22/2022 0944 Last data filed at 07/22/2022 0444 Gross per 24 hour  Intake 753.23 ml  Output 1145 ml  Net -391.77 ml      07/19/2022    5:53 PM 12/05/2021   10:00 AM 09/20/2021   10:14 AM  Last 3 Weights  Weight (lbs) 175 lb 190 lb 192 lb  Weight (kg) 79.379 kg 86.183 kg 87.091 kg      Telemetry    Baseline artifact, presumed sinus rhythm, narrow complex tachycardia.  No clear A-fib- Personally Reviewed  ECG    N/a  Physical Exam   GEN: Elderly ill-appearing male in no acute distress.   Neck: No JVD Cardiac: RRR, no murmurs, rubs, or gallops.  Respiratory: Clear to auscultation bilaterally. GI: Soft, nontender, non-distended  MS: No edema; No deformity. Neuro:  Nonfocal, oriented to name and place  but not to time Psych: Alert but somewhat confused  Labs    High Sensitivity Troponin:  No results for input(s): "TROPONINIHS" in the last 720 hours.   Chemistry Recent Labs  Lab 07/19/22 1504 07/19/22 1537 07/20/22 0335 07/21/22 0338 07/22/22 0237  NA 134*   < > 138 134* 135  K 4.9   < > 4.2 3.6 4.3  CL 101  --  105 104 100  CO2 24  --  24 21* 24  GLUCOSE 186*  --  105* 111* 112*  BUN 33*  --  25* 19 17  CREATININE 1.29*  --  1.10 0.91 1.09  CALCIUM 9.3  --  9.2 8.7* 9.4  MG  --   --   --  1.4* 1.8  PROT 6.9  --   --  6.1* 6.7  ALBUMIN 3.6  --   --  3.2* 3.5  AST 25  --   --  19 20  ALT 10  --   --  9 11  ALKPHOS 74  --   --  68 70  BILITOT 0.8  --   --  0.5 0.9  GFRNONAA 54*  --  >60 >60 >  60  ANIONGAP 9  --  '9 9 11   '$ < > = values in this interval not displayed.    Lipids  Recent Labs  Lab 07/21/22 0338  CHOL 149  TRIG 71  HDL 31*  LDLCALC 104*  CHOLHDL 4.8    Hematology Recent Labs  Lab 07/20/22 0335 07/21/22 0338 07/22/22 0237  WBC 8.0 7.9 8.6  RBC 4.11* 3.99* 4.27  HGB 12.5* 12.1* 13.0  HCT 36.4* 34.2* 36.6*  MCV 88.6 85.7 85.7  MCH 30.4 30.3 30.4  MCHC 34.3 35.4 35.5  RDW 13.1 12.8 13.1  PLT 171 176 195   Thyroid  Recent Labs  Lab 07/19/22 1754  TSH 1.473    BNPNo results for input(s): "BNP", "PROBNP" in the last 168 hours.  DDimer No results for input(s): "DDIMER" in the last 168 hours.   Radiology    ECHOCARDIOGRAM COMPLETE  Result Date: 07/20/2022    ECHOCARDIOGRAM REPORT   Patient Name:   Alcide Loletha Grayer Mcgurn Date of Exam: 07/20/2022 Medical Rec #:  [id number]    Height:       74.0 in Accession #:    [id number]   Weight:       175.0 lb Date of Birth:  1935/06/09     BSA:          2.054 m Patient Age:    55 years     BP:           144/64 mmHg Patient Gender: M            HR:           53 bpm. Exam Location:  Inpatient Procedure: 2D Echo, Color Doppler and Cardiac Doppler Indications:    Stroke  History:        Patient has prior history of  Echocardiogram examinations, most                 recent 03/03/2020. CAD; Risk Factors:Diabetes, Dyslipidemia and                 Hypertension.  Sonographer:    Memory Argue Referring Phys: [phone] A CALDWELL [place]  1. Left ventricular ejection fraction, by estimation, is 60 to 65%. The left ventricle has normal function. The left ventricle has no regional wall motion abnormalities. Left ventricular diastolic function could not be evaluated.  2. Right ventricular systolic function was not well visualized. The right ventricular size is normal.  3. Left atrial size was mildly dilated.  4. The mitral valve is abnormal. Not well visualized mitral valve regurgitation. No evidence of mitral stenosis.  5. The aortic valve is tricuspid. There is moderate calcification of the aortic valve. There is mild thickening of the aortic valve. Aortic valve regurgitation is trivial. Aortic valve sclerosis/calcification is present, without any evidence of aortic stenosis. Comparison(s): Changes from prior study are noted. Mitral regurgitation noted as moderate to severe on prior study. Not well visualized on current study. Conclusion(s)/Recommendation(s): Otherwise normal echocardiogram, with minor abnormalities described in the report. No intracardiac source of embolism detected on this transthoracic study. Consider a transesophageal echocardiogram to exclude cardiac source of embolism if clinically indicated. FINDINGS  Left Ventricle: Left ventricular ejection fraction, by estimation, is 60 to 65%. The left ventricle has normal function. The left ventricle has no regional wall motion abnormalities. The left ventricular internal cavity size was normal in size. There is  no left ventricular hypertrophy. Left ventricular diastolic function could not be evaluated due to  atrial fibrillation. Left ventricular diastolic function could not be evaluated. Right Ventricle: The right ventricular size is normal. Right vetricular  wall thickness was not well visualized. Right ventricular systolic function was not well visualized. Left Atrium: Left atrial size was mildly dilated. Right Atrium: Right atrial size was normal in size. Pericardium: There is no evidence of pericardial effusion. Mitral Valve: Calcified subvalvular apparatus. The mitral valve is abnormal. There is mild thickening of the mitral valve leaflet(s). There is moderate calcification of the mitral valve leaflet(s). Not well visualized mitral valve regurgitation. No evidence of mitral valve stenosis. Tricuspid Valve: The tricuspid valve is normal in structure. Tricuspid valve regurgitation is mild . No evidence of tricuspid stenosis. Aortic Valve: The aortic valve is tricuspid. There is moderate calcification of the aortic valve. There is mild thickening of the aortic valve. Aortic valve regurgitation is trivial. Aortic valve sclerosis/calcification is present, without any evidence of aortic stenosis. Aortic valve mean gradient measures 3.0 mmHg. Aortic valve peak gradient measures 5.0 mmHg. Aortic valve area, by VTI measures 2.22 cm. Pulmonic Valve: The pulmonic valve was not well visualized. Pulmonic valve regurgitation is not visualized. Aorta: The ascending aorta was not well visualized. Venous: The inferior vena cava was not well visualized. IAS/Shunts: The interatrial septum was not well visualized.  LEFT VENTRICLE PLAX 2D LVIDd:         4.60 cm   Diastology LVIDs:         3.20 cm   LV e' medial:    6.31 cm/s LV PW:         1.00 cm   LV E/e' medial:  18.7 LV IVS:        1.00 cm   LV e' lateral:   6.64 cm/s LVOT diam:     2.20 cm   LV E/e' lateral: 17.8 LV SV:         62 LV SV Index:   30 LVOT Area:     3.80 cm  RIGHT VENTRICLE RV S prime:     7.83 cm/s LEFT ATRIUM             Index        RIGHT ATRIUM           Index LA diam:        3.20 cm 1.56 cm/m   RA Area:     11.50 cm LA Vol (A2C):   44.6 ml 21.72 ml/m  RA Volume:   20.80 ml  10.13 ml/m LA Vol (A4C):   68.4  ml 33.31 ml/m LA Biplane Vol: 56.0 ml 27.27 ml/m  AORTIC VALVE AV Area (Vmax):    2.37 cm AV Area (Vmean):   2.20 cm AV Area (VTI):     2.22 cm AV Vmax:           112.00 cm/s AV Vmean:          74.900 cm/s AV VTI:            0.281 m AV Peak Grad:      5.0 mmHg AV Mean Grad:      3.0 mmHg LVOT Vmax:         69.70 cm/s LVOT Vmean:        43.300 cm/s LVOT VTI:          0.164 m LVOT/AV VTI ratio: 0.58  AORTA Ao Root diam: 2.60 cm MITRAL VALVE                TRICUSPID VALVE  MV Area (PHT): 3.53 cm     TR Peak grad:   20.8 mmHg MV Decel Time: 215 msec     TR Vmax:        228.00 cm/s MR Peak grad: 61.3 mmHg MR Vmax:      391.33 cm/s   SHUNTS MV E velocity: 118.00 cm/s  Systemic VTI:  0.16 m MV A velocity: 120.00 cm/s  Systemic Diam: 2.20 cm MV E/A ratio:  0.98 Buford Dresser MD Electronically signed by Buford Dresser MD Signature Date/Time: 07/20/2022/6:24:41 PM    Final     Cardiac Studies   Echo 07/20/22 1. Left ventricular ejection fraction, by estimation, is 60 to 65%. The  left ventricle has normal function. The left ventricle has no regional  wall motion abnormalities. Left ventricular diastolic function could not  be evaluated.   2. Right ventricular systolic function was not well visualized. The right  ventricular size is normal.   3. Left atrial size was mildly dilated.   4. The mitral valve is abnormal. Not well visualized mitral valve  regurgitation. No evidence of mitral stenosis.   5. The aortic valve is tricuspid. There is moderate calcification of the  aortic valve. There is mild thickening of the aortic valve. Aortic valve  regurgitation is trivial. Aortic valve sclerosis/calcification is present,  without any evidence of aortic  stenosis.   Comparison(s): Changes from prior study are noted. Mitral regurgitation  noted as moderate to severe on prior study. Not well visualized on current  study.   Conclusion(s)/Recommendation(s): Otherwise normal echocardiogram, with   minor abnormalities described in the report. No intracardiac source of  embolism detected on this transthoracic study. Consider a transesophageal  echocardiogram to exclude cardiac  source of embolism if clinically indicated.   Patient Profile     87 y.o. male with a hx of CAD status post 35v CABG '09, hypertension, hyperlipidemia, diabetes, peripheral artery disease who is being seen for the evaluation of possible atrial fibrillation in setting of acute stroke and severe stenosis of bilateral proximal internal carotid artery.  Tentatively plan for left TCAR on Monday.  Assessment & Plan    1.  Narrow complex arrhythmia -Significant artifact on EKG as well as on telemetry.  No clear evidence of atrial fibrillation. Mostly Sinus rhythm with PAC and narrow complex tachycardia consistent with SVT. -Echocardiogram with preserved LV function without regional wall motion abnormality -Plan outpatient monitor -Could consider beta-blocker following permissive hypertension  2.  Mitral regurgitation -Was noted to have moderate to severe MR on prior echocardiogram but mitral valve unable to visualize this admission -Follow routinely as outpatient   For questions or updates, please contact [place] Please consult [email or web address] for contact info under        SignedLeanor Kail, PA  07/22/2022, 9:44 AM

## 2022-07-22 NOTE — Progress Notes (Signed)
Occupational Therapy Treatment Patient Details Name: [name] MRN: [id number] DOB: August 27, 1935 Today's Date: 07/22/2022   History of present illness Pt is an 87 y/o male presenting with increased confusion. Imaging showed L BG infarct. PMH: CAD, DM, GERD, dementia, HTN, PAD , R TKA   OT comments  Patient edge of bed with PT when therapist entered the room. Treatment focused on improving sitting balance and working on functional mobility to advance ADLs. Patient required varying levels of assist to sit edge of bed due to posterior lean. +2 assist to stand with stedy using cross bar and to scoot towards head of bed. Patient able to follow simple commands. Verbalizations minimal but able to answer direct questions. Patient returned to supine and abel to wash his face with setup. Left in chair position and in the company of his son. Continue POC.    Recommendations for follow up therapy are one component of a multi-disciplinary discharge planning process, led by the attending physician.  Recommendations may be updated based on patient status, additional functional criteria and insurance authorization.    Follow Up Recommendations  Skilled nursing-short term rehab (<3 hours/day)    Assistance Recommended at Discharge Frequent or constant Supervision/Assistance  Patient can return home with the following  Two people to help with walking and/or transfers;Two people to help with bathing/dressing/bathroom   Equipment Recommendations  None recommended by OT    Recommendations for Other Services      Precautions / Restrictions Precautions Precautions: Fall Precaution Comments: retropulsion Restrictions Weight Bearing Restrictions: No       Mobility Bed Mobility Overal bed mobility: Needs Assistance Bed Mobility: Supine to Sit     Supine to sit: Max assist, HOB elevated Sit to supine: Mod assist, +2 for physical assistance   General bed mobility comments: assist for legs off bed and  to lift trunk (heavy lift) from bed; to supine, assist for legs and trunk positioning.    Transfers Overall transfer level: Needs assistance Equipment used: Rolling walker (2 wheels), 2 person hand held assist Transfers: Sit to/from Stand Sit to Stand: From elevated surface          Lateral/Scoot Transfers: Mod assist, +2 physical assistance General transfer comment: used Stedy for sit to stand, to initiate anterior weight shift and to block feet due to posterior bias; +2 for lifting, encouraging COG over BOS and for balance due to L lateral lean; also scooting toward [facility] with over the back technique +2 mod A for several scoots     Balance Overall balance assessment: Needs assistance Sitting-balance support: No upper extremity supported, Feet supported Sitting balance-Leahy Scale: Poor Sitting balance - Comments: Variable Min-Max A for sitting balance; extended time on EOB working on anterior weight shift with variety of techniques, stretching low back forward with manual assist, having pt pull on belt around therapist's waist, having pt place forehead on therapist forehead, working on postural awareness and trunk flexibility Postural control: Posterior lean, Left lateral lean Standing balance support: Bilateral upper extremity supported Standing balance-Leahy Scale: Zero Standing balance comment: +2 and UE support for standing due to posterior and L lateral lean                           ADL either performed or assessed with clinical judgement   ADL Overall ADL's : Needs assistance/impaired     Grooming: Wash/dry face;Set up;Bed level Grooming Details (indicate cue type and reason): after edge of bed  activities returned to supine. Patient able to wash face with setup.                                    Extremity/Trunk Assessment              Vision   Vision Assessment?: No apparent visual deficits   Perception     Praxis      Cognition  Arousal/Alertness: Awake/alert Behavior During Therapy: WFL for tasks assessed/performed Overall Cognitive Status: History of cognitive impairments - at baseline                                 General Comments: hx of dementia, follows one step directions fairly consistently. minimally verbal but does answer direct questions        Exercises      Shoulder Instructions       General Comments son present to assist with mentation and telesitter in the room; condom cath on but leaky, HR up to 114 with activity, no SOB noted on RA    Pertinent Vitals/ Pain       Pain Assessment Pain Assessment: No/denies pain  Home Living                                          Prior Functioning/Environment              Frequency  Min 2X/week        Progress Toward Goals  OT Goals(current goals can now be found in the care plan section)  Progress towards OT goals: Progressing toward goals  Acute Rehab OT Goals Patient Stated Goal: improve functional abliities OT Goal Formulation: With patient/family Time For Goal Achievement: 08/03/22 Potential to Achieve Goals: [place] Discharge plan remains appropriate    Co-evaluation    PT/OT/SLP Co-Evaluation/Treatment: Yes Reason for Co-Treatment: Complexity of the patient's impairments (multi-system involvement);For patient/therapist safety;To address functional/ADL transfers   OT goals addressed during session: ADL's and self-care;Other (comment) (sitting balance)      AM-PAC OT "6 Clicks" Daily Activity     Outcome Measure   Help from another person eating meals?: A Lot Help from another person taking care of personal grooming?: A Little Help from another person toileting, which includes using toliet, bedpan, or urinal?: Total Help from another person bathing (including washing, rinsing, drying)?: A Lot Help from another person to put on and taking off regular upper body clothing?: A Lot Help  from another person to put on and taking off regular lower body clothing?: Total 6 Click Score: 11    End of Session Equipment Utilized During Treatment: Gait belt;Rolling walker (2 wheels)  OT Visit Diagnosis: Other abnormalities of gait and mobility (R26.89);Unsteadiness on feet (R26.81)   Activity Tolerance Patient tolerated treatment well   Patient Left in bed;with call bell/phone within reach;with family/visitor present   Nurse Communication Mobility status        Time: 2951-8841 OT Time Calculation (min): 21 min  Charges: OT General Charges $OT Visit: 1 Visit OT Treatments $Therapeutic Activity: 8-22 mins  Gustavo Lah, OTR/L Acute Care Rehab Services  Office [phone]   Lenward Chancellor 07/22/2022, 10:38 AM

## 2022-07-22 NOTE — Progress Notes (Signed)
PROGRESS NOTE    [name]  ENI:[id number] DOB: [birth year] DOA: 07/19/2022 PCP: Ginger Organ., MD  Chief Complaint  Patient presents with   Altered Mental Status    Brief Narrative:  [name] is [name] 87 y.o. male with medical history significant of CAD, T2DM, GERD, HLD, HTN, PAD, hx of CVA who presented to ED with change in mental status two days ago.  For 2 days he has been lethargic and confused. His caregiver also thought he had [name] left facial droop and so did the hairdresser. She thinks this is better. He has chronic left sided weakness and this seems unchanged.   He has stopped talking almost completely. He typically will converse with his wife some, but this is abnormal for him. He typically walks with [name] walker, but 2 days ago was in wheel chair.    At baseline he does talk, but very little. He walks around the house with walker. He needs full assist x 2 for showering. He needs help with dressing. He can feed himself. No falls in the last year.    He has not had any fever/chills. No open sores or bed sores. He has been coughing for 2 days that is dry,but at times is clear phlegm. No odor to urine, unsure of color change. He has been eating and drinking well. No N/V/D.      He does not smoke or drink alcohol.    ER Course:  vitals: afebrile, bp: 135/61, HR; 69, RR: 20, oxygen: 98%RA Pertinent labs: BUN: 33, creatinine: 1.29,  CXR: mild bibasilar subsegmental atelectasis or infiltrates CT head: no acute finding  In ED: given 1L IVF and rocephin/azithromycin. BC obtained. TRH asked to admit.   Assessment & Plan:   Principal Problem:   Acute encephalopathy Active Problems:   Pulmonary infiltrates   Type 2 diabetes mellitus with stage 3a chronic kidney disease, without long-term current use of insulin (HCC)   Coronary artery disease involving native heart without angina pectoris   Essential hypertension   Chronic kidney disease, stage 3a (HCC)   Dementia without  behavioral disturbance ([place])   GERD without esophagitis   Stroke ([place])   Assessment and Plan: Left Basal Ganglia Infarct ? Related to encephalopathy below MRI with 2.3 cm acute ischemic L basal ganglia infarct CTA head/neck with near occlusion of proximal M1 segment of L MCA with reconstitution of distal M1 and M2 segments, severe stenosis at origin of bilateral internal carotid arteries Echo 60-65, no RWMA, moderate to severe mitral regurg  A1c 6.5, lipid panel pending, TSH wnl PT/OT/SLP Continue tele monitoring - no clear evidence of afib, cards recommending low dose metoprolol daily as tolerated by BP, consider outpatient monitoring for afib after discharge Vascular evaluating per neuro given severe (90%) stenosis on the L Already on plavix, aspirin started.  Has not tolerated statins in the past.  Zetia. Appreciate neurology assistance  Dysphagia SLP recommending dysphagia 3, thin liquids Will follow SLP recs They note hx of vomiting after eating for about 1 year, esophagram   Acute encephalopathy  History of Dementia Related to stroke vs pneumonia MRI as above UA not c/w UTI TSH wnl B12 low normal, follow MMA - supplement with oral b12 while awaiting results PT/OT for weakness Delirium precautions   Pulmonary infiltrates Being treated for possible pneumonia Patient is afebrile with no leukocytosis, normal lactic acid, has had [name] cough  CAP coverage, plan for 5 day course Urine strep negative, urine legionella  pending Negative RVP negative covid/flu Blood cx pending   Type 2 diabetes mellitus with stage 3a chronic kidney disease, without long-term current use of insulin (HCC) A1c 6.5 Hold metformin while inpatient SSI and accuchecks QAC/HS    Coronary artery disease involving native heart without angina pectoris Hx of CABG Can not tolerate statins Continue toprol-xl and plavix    Essential hypertension Soft blood pressures hold toprol-xl and altace     Chronic kidney disease, stage 3a (HCC) At baseline, continue to monitor    Dementia without behavioral disturbance ([place]) delerium precautions    GERD without esophagitis Continue protonix    Moderate to Severe MR Follow outpatient with cards     DVT prophylaxis: lovenox Code Status: full Family Communication: wife at bedside, caregiver Disposition:   Status is: Observation The patient remains OBS appropriate and will d/c before 2 midnights.   Consultants:  neurology  Procedures:  none  Antimicrobials:  Anti-infectives (From admission, onward)    Start     Dose/Rate Route Frequency Ordered Stop   07/20/22 1745  cefTRIAXone (ROCEPHIN) 2 g in sodium chloride 0.9 % 100 mL IVPB        2 g 200 mL/hr over 30 Minutes Intravenous Every 24 hours 07/19/22 1744 07/25/22 1744   07/20/22 1600  azithromycin (ZITHROMAX) 500 mg in sodium chloride 0.9 % 250 mL IVPB        500 mg 250 mL/hr over 60 Minutes Intravenous Every 24 hours 07/19/22 1745 07/25/22 1559   07/19/22 1615  cefTRIAXone (ROCEPHIN) 2 g in sodium chloride 0.9 % 100 mL IVPB        2 g 200 mL/hr over 30 Minutes Intravenous  Once 07/19/22 1607 07/19/22 1725   07/19/22 1615  azithromycin (ZITHROMAX) 500 mg in sodium chloride 0.9 % 250 mL IVPB        500 mg 250 mL/hr over 60 Minutes Intravenous  Once 07/19/22 1607 07/19/22 1742       Subjective: No complaints Pleasantly confused  Objective: Vitals:   07/20/22 2319 07/21/22 0015 07/21/22 0351 07/21/22 1100  BP: (!) 152/105 (!) 153/92 (!) 152/85 (!) 150/74  Pulse: 75  88 85  Resp: '18  17 17  '$ Temp: 97.7 F (36.5 C)  97.6 F (36.4 C) 98 F (36.7 C)  TempSrc: Axillary  Axillary Axillary  SpO2: 99%  92% 98%  Weight:      Height:        Intake/Output Summary (Last 24 hours) at 07/21/2022 1516 Last data filed at 07/21/2022 0500 Gross per 24 hour  Intake --  Output 400 ml  Net -400 ml   Filed Weights   07/19/22 1753  Weight: 79.4 kg     Examination:  General: No acute distress. Cardiovascular: RRR Lungs: unlabored Abdomen: Soft, nontender, nondistended  Neurological: alert, confused, moving all extremities Extremities: No clubbing or cyanosis. No edema.   Data Reviewed: I have personally reviewed following labs and imaging studies  CBC: Recent Labs  Lab 07/19/22 1504 07/19/22 1537 07/20/22 0335 07/21/22 0338  WBC 8.4  --  8.0 7.9  NEUTROABS 5.3  --   --  4.3  HGB 13.2 13.3 12.5* 12.1*  HCT 39.7 39.0 36.4* 34.2*  MCV 90.4  --  88.6 85.7  PLT 208  --  171 628    Basic Metabolic Panel: Recent Labs  Lab 07/19/22 1504 07/19/22 1537 07/20/22 0335 07/21/22 0338  NA 134* 133* 138 134*  K 4.9 5.9* 4.2 3.6  CL 101  --  105 104  CO2 24  --  24 21*  GLUCOSE 186*  --  105* 111*  BUN 33*  --  25* 19  CREATININE 1.29*  --  1.10 0.91  CALCIUM 9.3  --  9.2 8.7*  MG  --   --   --  1.4*  PHOS  --   --   --  2.8    GFR: Estimated Creatinine Clearance: 66.7 mL/min (by C-G formula based on SCr of 0.91 mg/dL).  Liver Function Tests: Recent Labs  Lab 07/19/22 1504 07/21/22 0338  AST 25 19  ALT 10 9  ALKPHOS 74 68  BILITOT 0.8 0.5  PROT 6.9 6.1*  ALBUMIN 3.6 3.2*    CBG: Recent Labs  Lab 07/20/22 0821 07/20/22 1137 07/20/22 2206 07/21/22 0636 07/21/22 1212  GLUCAP 98 129* 127* 132* 148*     Recent Results (from the past 240 hour(s))  Blood Culture (Routine X 2)     Status: None (Preliminary result)   Collection Time: 07/19/22  2:50 PM   Specimen: BLOOD  Result Value Ref Range Status   Specimen Description BLOOD SITE NOT SPECIFIED  Final   Special Requests   Final    BOTTLES DRAWN AEROBIC AND ANAEROBIC Blood Culture adequate volume   Culture   Final    NO GROWTH 2 DAYS Performed at [facility], [place] [address] [place], [place] [address]    Report Status PENDING  Incomplete  Blood Culture (Routine X 2)     Status: None (Preliminary result)   Collection Time: 07/19/22  3:00 PM    Specimen: BLOOD  Result Value Ref Range Status   Specimen Description BLOOD SITE NOT SPECIFIED  Final   Special Requests   Final    BOTTLES DRAWN AEROBIC AND ANAEROBIC Blood Culture results may not be optimal due to an inadequate volume of blood received in culture bottles   Culture   Final    NO GROWTH 2 DAYS Performed at [facility], [place] [address], [place],  [address]    Report Status PENDING  Incomplete  Resp Panel by RT-PCR (Flu Marajade Lei&B, Covid) Anterior Nasal Swab     Status: None   Collection Time: 07/19/22  3:36 PM   Specimen: Anterior Nasal Swab  Result Value Ref Range Status   SARS Coronavirus 2 by RT PCR NEGATIVE NEGATIVE Final    Comment: (NOTE) SARS-CoV-2 target nucleic acids are NOT DETECTED.  The SARS-CoV-2 RNA is generally detectable in upper respiratory specimens during the acute phase of infection. The lowest concentration of SARS-CoV-2 viral copies this assay can detect is 138 copies/mL. Wava Kildow negative result does not preclude SARS-Cov-2 infection and should not be used as the sole basis for treatment or other patient management decisions. Hubbert Landrigan negative result may occur with  improper specimen collection/handling, submission of specimen other than nasopharyngeal swab, presence of viral mutation(s) within the areas targeted by this assay, and inadequate number of viral copies(<138 copies/mL). Orel Cooler negative result must be combined with clinical observations, patient history, and epidemiological information. The expected result is Negative.  Fact Sheet for Patients:  EntrepreneurPulse.com.au  Fact Sheet for Healthcare Providers:  IncredibleEmployment.be  This test is no t yet approved or cleared by the [place] FDA and  has been authorized for detection and/or diagnosis of SARS-CoV-2 by FDA under an Emergency Use Authorization (EUA). This EUA will remain  in effect (meaning this test can be used) for the duration of  the COVID-19 declaration  under Section 564(b)(1) of the Act, 21 U.S.C.section 360bbb-3(b)(1), unless the authorization is terminated  or revoked sooner.       Influenza Jerriann Schrom by PCR NEGATIVE NEGATIVE Final   Influenza B by PCR NEGATIVE NEGATIVE Final    Comment: (NOTE) The Xpert Xpress SARS-CoV-2/FLU/RSV plus assay is intended as an aid in the diagnosis of influenza from Nasopharyngeal swab specimens and should not be used as Railynn Ballo sole basis for treatment. Nasal washings and aspirates are unacceptable for Xpert Xpress SARS-CoV-2/FLU/RSV testing.  Fact Sheet for Patients: EntrepreneurPulse.com.au  Fact Sheet for Healthcare Providers: IncredibleEmployment.be  This test is not yet approved or cleared by the [place] FDA and has been authorized for detection and/or diagnosis of SARS-CoV-2 by FDA under an Emergency Use Authorization (EUA). This EUA will remain in effect (meaning this test can be used) for the duration of the COVID-19 declaration under Section 564(b)(1) of the Act, 21 U.S.C. section 360bbb-3(b)(1), unless the authorization is terminated or revoked.  Performed at [facility], [place] [address] [place], [place] [address]   Respiratory (~20 pathogens) panel by PCR     Status: None   Collection Time: 07/19/22  3:36 PM   Specimen: Nasopharyngeal Swab; Respiratory  Result Value Ref Range Status   Adenovirus NOT DETECTED NOT DETECTED Final   Coronavirus 229E NOT DETECTED NOT DETECTED Final    Comment: (NOTE) The Coronavirus on the Respiratory Panel, DOES NOT test for the novel  Coronavirus (2019 nCoV)    Coronavirus HKU1 NOT DETECTED NOT DETECTED Final   Coronavirus NL63 NOT DETECTED NOT DETECTED Final   Coronavirus OC43 NOT DETECTED NOT DETECTED Final   Metapneumovirus NOT DETECTED NOT DETECTED Final   Rhinovirus / Enterovirus NOT DETECTED NOT DETECTED Final   Influenza Harkirat Orozco NOT DETECTED NOT DETECTED Final   Influenza B NOT  DETECTED NOT DETECTED Final   Parainfluenza Virus 1 NOT DETECTED NOT DETECTED Final   Parainfluenza Virus 2 NOT DETECTED NOT DETECTED Final   Parainfluenza Virus 3 NOT DETECTED NOT DETECTED Final   Parainfluenza Virus 4 NOT DETECTED NOT DETECTED Final   Respiratory Syncytial Virus NOT DETECTED NOT DETECTED Final   Bordetella pertussis NOT DETECTED NOT DETECTED Final   Bordetella Parapertussis NOT DETECTED NOT DETECTED Final   Chlamydophila pneumoniae NOT DETECTED NOT DETECTED Final   Mycoplasma pneumoniae NOT DETECTED NOT DETECTED Final    Comment: Performed at [facility], [place]. [address] [place], [place] [address]         Radiology Studies: ECHOCARDIOGRAM COMPLETE  Result Date: 07/20/2022    ECHOCARDIOGRAM REPORT   Patient Name:   Shota Loletha Grayer Pavlik Date of Exam: 07/20/2022 Medical Rec #:  [id number]    Height:       74.0 in Accession #:    [id number]   Weight:       175.0 lb Date of Birth:  05/04/1935     BSA:          2.054 m Patient Age:    77 years     BP:           144/64 mmHg Patient Gender: M            HR:           53 bpm. Exam Location:  Inpatient Procedure: 2D Echo, Color Doppler and Cardiac Doppler Indications:    Stroke  History:        Patient has prior history of Echocardiogram examinations, most  recent 03/03/2020. CAD; Risk Factors:Diabetes, Dyslipidemia and                 Hypertension.  Sonographer:    Memory Argue Referring Phys: [phone] Sonji Starkes CALDWELL [place]  1. Left ventricular ejection fraction, by estimation, is 60 to 65%. The left ventricle has normal function. The left ventricle has no regional wall motion abnormalities. Left ventricular diastolic function could not be evaluated.  2. Right ventricular systolic function was not well visualized. The right ventricular size is normal.  3. Left atrial size was mildly dilated.  4. The mitral valve is abnormal. Not well visualized mitral valve regurgitation. No evidence of mitral stenosis.  5. The  aortic valve is tricuspid. There is moderate calcification of the aortic valve. There is mild thickening of the aortic valve. Aortic valve regurgitation is trivial. Aortic valve sclerosis/calcification is present, without any evidence of aortic stenosis. Comparison(s): Changes from prior study are noted. Mitral regurgitation noted as moderate to severe on prior study. Not well visualized on current study. Conclusion(s)/Recommendation(s): Otherwise normal echocardiogram, with minor abnormalities described in the report. No intracardiac source of embolism detected on this transthoracic study. Consider Mignon Bechler transesophageal echocardiogram to exclude cardiac source of embolism if clinically indicated. FINDINGS  Left Ventricle: Left ventricular ejection fraction, by estimation, is 60 to 65%. The left ventricle has normal function. The left ventricle has no regional wall motion abnormalities. The left ventricular internal cavity size was normal in size. There is  no left ventricular hypertrophy. Left ventricular diastolic function could not be evaluated due to atrial fibrillation. Left ventricular diastolic function could not be evaluated. Right Ventricle: The right ventricular size is normal. Right vetricular wall thickness was not well visualized. Right ventricular systolic function was not well visualized. Left Atrium: Left atrial size was mildly dilated. Right Atrium: Right atrial size was normal in size. Pericardium: There is no evidence of pericardial effusion. Mitral Valve: Calcified subvalvular apparatus. The mitral valve is abnormal. There is mild thickening of the mitral valve leaflet(s). There is moderate calcification of the mitral valve leaflet(s). Not well visualized mitral valve regurgitation. No evidence of mitral valve stenosis. Tricuspid Valve: The tricuspid valve is normal in structure. Tricuspid valve regurgitation is mild . No evidence of tricuspid stenosis. Aortic Valve: The aortic valve is tricuspid.  There is moderate calcification of the aortic valve. There is mild thickening of the aortic valve. Aortic valve regurgitation is trivial. Aortic valve sclerosis/calcification is present, without any evidence of aortic stenosis. Aortic valve mean gradient measures 3.0 mmHg. Aortic valve peak gradient measures 5.0 mmHg. Aortic valve area, by VTI measures 2.22 cm. Pulmonic Valve: The pulmonic valve was not well visualized. Pulmonic valve regurgitation is not visualized. Aorta: The ascending aorta was not well visualized. Venous: The inferior vena cava was not well visualized. IAS/Shunts: The interatrial septum was not well visualized.  LEFT VENTRICLE PLAX 2D LVIDd:         4.60 cm   Diastology LVIDs:         3.20 cm   LV e' medial:    6.31 cm/s LV PW:         1.00 cm   LV E/e' medial:  18.7 LV IVS:        1.00 cm   LV e' lateral:   6.64 cm/s LVOT diam:     2.20 cm   LV E/e' lateral: 17.8 LV SV:         62 LV SV Index:   30  LVOT Area:     3.80 cm  RIGHT VENTRICLE RV S prime:     7.83 cm/s LEFT ATRIUM             Index        RIGHT ATRIUM           Index LA diam:        3.20 cm 1.56 cm/m   RA Area:     11.50 cm LA Vol (A2C):   44.6 ml 21.72 ml/m  RA Volume:   20.80 ml  10.13 ml/m LA Vol (A4C):   68.4 ml 33.31 ml/m LA Biplane Vol: 56.0 ml 27.27 ml/m  AORTIC VALVE AV Area (Vmax):    2.37 cm AV Area (Vmean):   2.20 cm AV Area (VTI):     2.22 cm AV Vmax:           112.00 cm/s AV Vmean:          74.900 cm/s AV VTI:            0.281 m AV Peak Grad:      5.0 mmHg AV Mean Grad:      3.0 mmHg LVOT Vmax:         69.70 cm/s LVOT Vmean:        43.300 cm/s LVOT VTI:          0.164 m LVOT/AV VTI ratio: 0.58  AORTA Ao Root diam: 2.60 cm MITRAL VALVE                TRICUSPID VALVE MV Area (PHT): 3.53 cm     TR Peak grad:   20.8 mmHg MV Decel Time: 215 msec     TR Vmax:        228.00 cm/s MR Peak grad: 61.3 mmHg MR Vmax:      391.33 cm/s   SHUNTS MV E velocity: 118.00 cm/s  Systemic VTI:  0.16 m MV Marabelle Cushman velocity: 120.00 cm/s   Systemic Diam: 2.20 cm MV E/Sarayu Prevost ratio:  0.98 Buford Dresser MD Electronically signed by Buford Dresser MD Signature Date/Time: 07/20/2022/6:24:41 PM    Final    CT ANGIO HEAD NECK W WO CM  Result Date: 07/20/2022 CLINICAL DATA:  Stroke follow-up EXAM: CT ANGIOGRAPHY HEAD AND NECK TECHNIQUE: Multidetector CT imaging of the head and neck was performed using the standard protocol during bolus administration of intravenous contrast. Multiplanar CT image reconstructions and MIPs were obtained to evaluate the vascular anatomy. Carotid stenosis measurements (when applicable) are obtained utilizing NASCET criteria, using the distal internal carotid diameter as the denominator. RADIATION DOSE REDUCTION: This exam was performed according to the departmental dose-optimization program which includes automated exposure control, adjustment of the mA and/or kV according to patient size and/or use of iterative reconstruction technique. CONTRAST:  70m OMNIPAQUE IOHEXOL 350 MG/ML SOLN COMPARISON:  MRI head 07/19/2022, CT head 07/19/2022 FINDINGS: Limitations: Assessment is slightly limited due to the degree of motion artifact. Within this limitation CT HEAD FINDINGS Brain: Redemonstrated severe chronic microvascular ischemic change and advanced generalized volume loss. Redemonstrated hypodensity in the left basal ganglia in the region of known infarction. Unchanged size and shape of the ventricular system. No evidence of intracranial hemorrhage. Vascular: See below for vascular findings. Skull: Normal. Negative for fracture or focal lesion. Sinuses/Orbits: Bilateral lens replacement.  Sinuses are clear. Other: None. Review of the MIP images confirms the above findings CTA NECK FINDINGS Aortic arch: Standard branching. Imaged portion shows no evidence of aneurysm or dissection. No  significant stenosis of the major arch vessel origins. Right carotid system: There is scattered atherosclerotic plaque in the right common  carotid artery. There is severe greater than 75% stenosis at the origin of the right internal carotid artery (series 15, image 212) Left carotid system: There is scattered atherosclerotic plaque in the left common carotid artery. There is severe (approximate 90% stenosis at the origin of the left internal carotid artery (series 15, image 217). Vertebral arteries: There is mild stenosis of the origin of the right vertebral artery. There is mild stenosis in the distal V2 segment of the left vertebral artery (series 15, image 149) Skeleton: Negative. There is likely at least moderate spinal canal stenosis at C4-C5. There is Jiraiya Mcewan degenerative pannus at C1-C2. Other neck: Negative. Upper chest: There is severe centrilobular emphysema. Review of the MIP images confirms the above findings CTA HEAD FINDINGS Anterior circulation: There is near occlusion of the proximal M1 segment of the left MCA (series 15, image 90). The distal M1 and M2 segments are contrast opacified. Assessment of the A2 and A3 segments of the ACA is limited due to motion artifact. Posterior circulation: Assessment of the P2 and P3 segments of the bilateral PCAs is limited due to motion artifact. No significant stenosis, proximal occlusion, aneurysm, or vascular malformation. Venous sinuses: As permitted by contrast timing, patent. Anatomic variants: None Review of the MIP images confirms the above findings IMPRESSION: 1. Near occlusion of the proximal M1 segment of the left MCA with reconstitution of the distal M1 and M2 segments. 2. Severe stenosis at the origin of bilateral internal carotid arteries (greater than 75% on the right and approximately 90% on the left). 3. Redemonstrated hypodensities in the left basal ganglia, in keeping with patient's history of left basal ganglia infarct. No evidence of intracranial hemorrhage. 4. There is atherosclerotic plaque in the left subclavian artery resulting in moderate stenosis, proximal to the takeoff of the  left vertebral artery. Electronically Signed   By: Marin Roberts M.D.   On: 07/20/2022 09:22   MR BRAIN WO CONTRAST  Result Date: 07/19/2022 CLINICAL DATA:  Initial evaluation for neuro deficit, stroke suspected. EXAM: MRI HEAD WITHOUT CONTRAST TECHNIQUE: Multiplanar, multiecho pulse sequences of the brain and surrounding structures were obtained without intravenous contrast. COMPARISON:  Prior CT from earlier the same day as well as earlier studies. FINDINGS: Brain: Examination degraded by motion artifact. Generalized age-related cerebral atrophy. Patchy and confluent T2/FLAIR hyperintensity involving the periventricular deep white matter both cerebral hemispheres as well as the pons, most consistent with chronic small vessel ischemic disease. Few scatter remote lacunar infarcts noted about the deep gray nuclei. 2.3 cm acute ischemic nonhemorrhagic infarcts seen involving the left basal ganglia (series 5, image 83). No other evidence for acute or subacute ischemia. Gray-white matter differentiation otherwise maintained. No acute or chronic intracranial blood products. No mass lesion, midline shift or mass effect. Diffuse ventricular prominence related to global parenchymal volume loss without hydrocephalus. No extra-axial fluid collection. Pituitary gland and suprasellar region within normal limits. Vascular: Major intracranial vascular flow voids are maintained. Skull and upper cervical spine: Craniocervical junction within normal limits. Bone marrow signal intensity normal. No scalp soft tissue abnormality. Sinuses/Orbits: Patient status post bilateral ocular lens replacement. Scattered mucosal thickening noted about the paranasal sinuses. No significant mastoid effusion. Other: None. IMPRESSION: 1. 2.3 cm acute ischemic nonhemorrhagic left basal ganglia infarct. 2. Underlying age-related cerebral atrophy with mild chronic small vessel ischemic disease. Electronically Signed   By: Jeannine Boga  M.D.    On: 07/19/2022 23:54   CT HEAD WO CONTRAST  Result Date: 07/19/2022 CLINICAL DATA:  Mental status change, persistent or worsening EXAM: CT HEAD WITHOUT CONTRAST TECHNIQUE: Contiguous axial images were obtained from the base of the skull through the vertex without intravenous contrast. RADIATION DOSE REDUCTION: This exam was performed according to the departmental dose-optimization program which includes automated exposure control, adjustment of the mA and/or kV according to patient size and/or use of iterative reconstruction technique. COMPARISON:  CT head 07/09/2021. FINDINGS: Motion limited study. Brain: No evidence of acute large vascular territory infarct, acute hemorrhage, mass lesion, midline shift or hydrocephalus. Cerebral atrophy with ex vacuo ventricular dilation. Confluent and patchy hypodensities in the white matter, nonspecific but likely the sequela of chronic microvascular ischemic disease. Vascular: No hyperdense vessel identified. Calcific intracranial atherosclerosis. Skull: No acute fracture. Sinuses/Orbits: Largely clear sinuses.  No acute orbital findings. Other: No mastoid effusions. IMPRESSION: 1. No evidence of acute intracranial abnormality. 2. Similar chronic microvascular ischemic disease and cerebral atrophy (ICD10-G31.9). Electronically Signed   By: Margaretha Sheffield M.D.   On: 07/19/2022 15:52        Scheduled Meds:  aspirin  300 mg Rectal Daily   Or   aspirin  325 mg Oral Daily   clopidogrel  75 mg Oral Q supper   enoxaparin (LOVENOX) injection  40 mg Subcutaneous Q24H   ezetimibe  10 mg Oral Daily   insulin aspart  0-9 Units Subcutaneous TID WC   pantoprazole  40 mg Oral QAC supper   Continuous Infusions:  azithromycin (ZITHROMAX) 500 mg in sodium chloride 0.9 % 250 mL IVPB Stopped (07/20/22 1909)   cefTRIAXone (ROCEPHIN)  IV Stopped (07/20/22 1901)     LOS: 1 day    Time spent: over 10 min    Fayrene Helper, MD [facility]   To contact the  attending provider between 7A-7P or the covering provider during after hours 7P-7A, please log into the web site [email or web address] and access using universal [place] password for that web site. If you do not have the password, please call the hospital operator.  07/21/2022, 3:16 PM

## 2022-07-22 NOTE — Progress Notes (Signed)
STROKE TEAM PROGRESS NOTE   SUBJECTIVE (INTERVAL HISTORY) RN is at the bedside.  Pt lying in bed, eyes open, awake alert, on mittens bilaterally. RN reported not sleeping well at night and pulling things. VVS Dr. Carlis Abbott plan for TCAR next week.    OBJECTIVE Temp:  [97.6 F (36.4 C)-98.4 F (36.9 C)] 98 F (36.7 C) (09/22 1927) Pulse Rate:  [58-91] 62 (09/22 1927) Cardiac Rhythm: Normal sinus rhythm (09/22 1913) Resp:  [16-18] 16 (09/22 1927) BP: (100-163)/(59-98) 163/73 (09/22 1927) SpO2:  [76 %-100 %] 100 % (09/22 1927)  Recent Labs  Lab 07/21/22 1633 07/21/22 2044 07/22/22 0449 07/22/22 1128 07/22/22 1631  GLUCAP 97 173* 107* 164* 114*   Recent Labs  Lab 07/19/22 1504 07/19/22 1537 07/20/22 0335 07/21/22 0338 07/22/22 0237  NA 134* 133* 138 134* 135  K 4.9 5.9* 4.2 3.6 4.3  CL 101  --  105 104 100  CO2 24  --  24 21* 24  GLUCOSE 186*  --  105* 111* 112*  BUN 33*  --  25* 19 17  CREATININE 1.29*  --  1.10 0.91 1.09  CALCIUM 9.3  --  9.2 8.7* 9.4  MG  --   --   --  1.4* 1.8  PHOS  --   --   --  2.8 3.5   Recent Labs  Lab 07/19/22 1504 07/21/22 0338 07/22/22 0237  AST '25 19 20  '$ ALT '10 9 11  '$ ALKPHOS 74 68 70  BILITOT 0.8 0.5 0.9  PROT 6.9 6.1* 6.7  ALBUMIN 3.6 3.2* 3.5   Recent Labs  Lab 07/19/22 1504 07/19/22 1537 07/20/22 0335 07/21/22 0338 07/22/22 0237  WBC 8.4  --  8.0 7.9 8.6  NEUTROABS 5.3  --   --  4.3 5.0  HGB 13.2 13.3 12.5* 12.1* 13.0  HCT 39.7 39.0 36.4* 34.2* 36.6*  MCV 90.4  --  88.6 85.7 85.7  PLT 208  --  171 176 195   No results for input(s): "CKTOTAL", "CKMB", "CKMBINDEX", "TROPONINI" in the last 168 hours. No results for input(s): "LABPROT", "INR" in the last 72 hours. No results for input(s): "COLORURINE", "LABSPEC", "PHURINE", "GLUCOSEU", "HGBUR", "BILIRUBINUR", "KETONESUR", "PROTEINUR", "UROBILINOGEN", "NITRITE", "LEUKOCYTESUR" in the last 72 hours.  Invalid input(s): "APPERANCEUR"      Component Value Date/Time   CHOL  149 07/21/2022 0338   CHOL 170 02/18/2020 1058   TRIG 71 07/21/2022 0338   HDL 31 (L) 07/21/2022 0338   HDL 42 02/18/2020 1058   CHOLHDL 4.8 07/21/2022 0338   VLDL 14 07/21/2022 0338   LDLCALC 104 (H) 07/21/2022 0338   LDLCALC 107 (H) 02/18/2020 1058   Lab Results  Component Value Date   HGBA1C 6.5 (H) 07/19/2022      Component Value Date/Time   LABOPIA NONE DETECTED 07/19/2022 1747   COCAINSCRNUR NONE DETECTED 07/19/2022 1747   LABBENZ NONE DETECTED 07/19/2022 1747   AMPHETMU NONE DETECTED 07/19/2022 1747   THCU NONE DETECTED 07/19/2022 1747   LABBARB NONE DETECTED 07/19/2022 1747    Recent Labs  Lab 07/19/22 1506  ETH <10    I have personally reviewed the radiological images below and agree with the radiology interpretations.  ECHOCARDIOGRAM COMPLETE  Result Date: 07/20/2022    ECHOCARDIOGRAM REPORT   Patient Name:   [name] Date of Exam: 07/20/2022 Medical Rec #:  [id number]    Height:       74.0 in Accession #:    [id number]   Weight:  175.0 lb Date of Birth:  April 18, 1935     BSA:          2.054 m Patient Age:    87 years     BP:           144/64 mmHg Patient Gender: M            HR:           53 bpm. Exam Location:  Inpatient Procedure: 2D Echo, Color Doppler and Cardiac Doppler Indications:    Stroke  History:        Patient has prior history of Echocardiogram examinations, most                 recent 03/03/2020. CAD; Risk Factors:Diabetes, Dyslipidemia and                 Hypertension.  Sonographer:    Memory Argue Referring Phys: [phone] A CALDWELL [place]  1. Left ventricular ejection fraction, by estimation, is 60 to 65%. The left ventricle has normal function. The left ventricle has no regional wall motion abnormalities. Left ventricular diastolic function could not be evaluated.  2. Right ventricular systolic function was not well visualized. The right ventricular size is normal.  3. Left atrial size was mildly dilated.  4. The mitral valve is abnormal.  Not well visualized mitral valve regurgitation. No evidence of mitral stenosis.  5. The aortic valve is tricuspid. There is moderate calcification of the aortic valve. There is mild thickening of the aortic valve. Aortic valve regurgitation is trivial. Aortic valve sclerosis/calcification is present, without any evidence of aortic stenosis. Comparison(s): Changes from prior study are noted. Mitral regurgitation noted as moderate to severe on prior study. Not well visualized on current study. Conclusion(s)/Recommendation(s): Otherwise normal echocardiogram, with minor abnormalities described in the report. No intracardiac source of embolism detected on this transthoracic study. Consider a transesophageal echocardiogram to exclude cardiac source of embolism if clinically indicated. FINDINGS  Left Ventricle: Left ventricular ejection fraction, by estimation, is 60 to 65%. The left ventricle has normal function. The left ventricle has no regional wall motion abnormalities. The left ventricular internal cavity size was normal in size. There is  no left ventricular hypertrophy. Left ventricular diastolic function could not be evaluated due to atrial fibrillation. Left ventricular diastolic function could not be evaluated. Right Ventricle: The right ventricular size is normal. Right vetricular wall thickness was not well visualized. Right ventricular systolic function was not well visualized. Left Atrium: Left atrial size was mildly dilated. Right Atrium: Right atrial size was normal in size. Pericardium: There is no evidence of pericardial effusion. Mitral Valve: Calcified subvalvular apparatus. The mitral valve is abnormal. There is mild thickening of the mitral valve leaflet(s). There is moderate calcification of the mitral valve leaflet(s). Not well visualized mitral valve regurgitation. No evidence of mitral valve stenosis. Tricuspid Valve: The tricuspid valve is normal in structure. Tricuspid valve regurgitation is  mild . No evidence of tricuspid stenosis. Aortic Valve: The aortic valve is tricuspid. There is moderate calcification of the aortic valve. There is mild thickening of the aortic valve. Aortic valve regurgitation is trivial. Aortic valve sclerosis/calcification is present, without any evidence of aortic stenosis. Aortic valve mean gradient measures 3.0 mmHg. Aortic valve peak gradient measures 5.0 mmHg. Aortic valve area, by VTI measures 2.22 cm. Pulmonic Valve: The pulmonic valve was not well visualized. Pulmonic valve regurgitation is not visualized. Aorta: The ascending aorta was not well visualized. Venous: The inferior  vena cava was not well visualized. IAS/Shunts: The interatrial septum was not well visualized.  LEFT VENTRICLE PLAX 2D LVIDd:         4.60 cm   Diastology LVIDs:         3.20 cm   LV e' medial:    6.31 cm/s LV PW:         1.00 cm   LV E/e' medial:  18.7 LV IVS:        1.00 cm   LV e' lateral:   6.64 cm/s LVOT diam:     2.20 cm   LV E/e' lateral: 17.8 LV SV:         62 LV SV Index:   30 LVOT Area:     3.80 cm  RIGHT VENTRICLE RV S prime:     7.83 cm/s LEFT ATRIUM             Index        RIGHT ATRIUM           Index LA diam:        3.20 cm 1.56 cm/m   RA Area:     11.50 cm LA Vol (A2C):   44.6 ml 21.72 ml/m  RA Volume:   20.80 ml  10.13 ml/m LA Vol (A4C):   68.4 ml 33.31 ml/m LA Biplane Vol: 56.0 ml 27.27 ml/m  AORTIC VALVE AV Area (Vmax):    2.37 cm AV Area (Vmean):   2.20 cm AV Area (VTI):     2.22 cm AV Vmax:           112.00 cm/s AV Vmean:          74.900 cm/s AV VTI:            0.281 m AV Peak Grad:      5.0 mmHg AV Mean Grad:      3.0 mmHg LVOT Vmax:         69.70 cm/s LVOT Vmean:        43.300 cm/s LVOT VTI:          0.164 m LVOT/AV VTI ratio: 0.58  AORTA Ao Root diam: 2.60 cm MITRAL VALVE                TRICUSPID VALVE MV Area (PHT): 3.53 cm     TR Peak grad:   20.8 mmHg MV Decel Time: 215 msec     TR Vmax:        228.00 cm/s MR Peak grad: 61.3 mmHg MR Vmax:      391.33 cm/s    SHUNTS MV E velocity: 118.00 cm/s  Systemic VTI:  0.16 m MV A velocity: 120.00 cm/s  Systemic Diam: 2.20 cm MV E/A ratio:  0.98 Buford Dresser MD Electronically signed by Buford Dresser MD Signature Date/Time: 07/20/2022/6:24:41 PM    Final    CT ANGIO HEAD NECK W WO CM  Result Date: 07/20/2022 CLINICAL DATA:  Stroke follow-up EXAM: CT ANGIOGRAPHY HEAD AND NECK TECHNIQUE: Multidetector CT imaging of the head and neck was performed using the standard protocol during bolus administration of intravenous contrast. Multiplanar CT image reconstructions and MIPs were obtained to evaluate the vascular anatomy. Carotid stenosis measurements (when applicable) are obtained utilizing NASCET criteria, using the distal internal carotid diameter as the denominator. RADIATION DOSE REDUCTION: This exam was performed according to the departmental dose-optimization program which includes automated exposure control, adjustment of the mA and/or kV according to patient size and/or use of iterative reconstruction  technique. CONTRAST:  41m OMNIPAQUE IOHEXOL 350 MG/ML SOLN COMPARISON:  MRI head 07/19/2022, CT head 07/19/2022 FINDINGS: Limitations: Assessment is slightly limited due to the degree of motion artifact. Within this limitation CT HEAD FINDINGS Brain: Redemonstrated severe chronic microvascular ischemic change and advanced generalized volume loss. Redemonstrated hypodensity in the left basal ganglia in the region of known infarction. Unchanged size and shape of the ventricular system. No evidence of intracranial hemorrhage. Vascular: See below for vascular findings. Skull: Normal. Negative for fracture or focal lesion. Sinuses/Orbits: Bilateral lens replacement.  Sinuses are clear. Other: None. Review of the MIP images confirms the above findings CTA NECK FINDINGS Aortic arch: Standard branching. Imaged portion shows no evidence of aneurysm or dissection. No significant stenosis of the major arch vessel origins.  Right carotid system: There is scattered atherosclerotic plaque in the right common carotid artery. There is severe greater than 75% stenosis at the origin of the right internal carotid artery (series 15, image 212) Left carotid system: There is scattered atherosclerotic plaque in the left common carotid artery. There is severe (approximate 90% stenosis at the origin of the left internal carotid artery (series 15, image 217). Vertebral arteries: There is mild stenosis of the origin of the right vertebral artery. There is mild stenosis in the distal V2 segment of the left vertebral artery (series 15, image 149) Skeleton: Negative. There is likely at least moderate spinal canal stenosis at C4-C5. There is a degenerative pannus at C1-C2. Other neck: Negative. Upper chest: There is severe centrilobular emphysema. Review of the MIP images confirms the above findings CTA HEAD FINDINGS Anterior circulation: There is near occlusion of the proximal M1 segment of the left MCA (series 15, image 90). The distal M1 and M2 segments are contrast opacified. Assessment of the A2 and A3 segments of the ACA is limited due to motion artifact. Posterior circulation: Assessment of the P2 and P3 segments of the bilateral PCAs is limited due to motion artifact. No significant stenosis, proximal occlusion, aneurysm, or vascular malformation. Venous sinuses: As permitted by contrast timing, patent. Anatomic variants: None Review of the MIP images confirms the above findings IMPRESSION: 1. Near occlusion of the proximal M1 segment of the left MCA with reconstitution of the distal M1 and M2 segments. 2. Severe stenosis at the origin of bilateral internal carotid arteries (greater than 75% on the right and approximately 90% on the left). 3. Redemonstrated hypodensities in the left basal ganglia, in keeping with patient's history of left basal ganglia infarct. No evidence of intracranial hemorrhage. 4. There is atherosclerotic plaque in the left  subclavian artery resulting in moderate stenosis, proximal to the takeoff of the left vertebral artery. Electronically Signed   By: HMarin RobertsM.D.   On: 07/20/2022 09:22   MR BRAIN WO CONTRAST  Result Date: 07/19/2022 CLINICAL DATA:  Initial evaluation for neuro deficit, stroke suspected. EXAM: MRI HEAD WITHOUT CONTRAST TECHNIQUE: Multiplanar, multiecho pulse sequences of the brain and surrounding structures were obtained without intravenous contrast. COMPARISON:  Prior CT from earlier the same day as well as earlier studies. FINDINGS: Brain: Examination degraded by motion artifact. Generalized age-related cerebral atrophy. Patchy and confluent T2/FLAIR hyperintensity involving the periventricular deep white matter both cerebral hemispheres as well as the pons, most consistent with chronic small vessel ischemic disease. Few scatter remote lacunar infarcts noted about the deep gray nuclei. 2.3 cm acute ischemic nonhemorrhagic infarcts seen involving the left basal ganglia (series 5, image 83). No other evidence for acute or subacute ischemia.  Gray-white matter differentiation otherwise maintained. No acute or chronic intracranial blood products. No mass lesion, midline shift or mass effect. Diffuse ventricular prominence related to global parenchymal volume loss without hydrocephalus. No extra-axial fluid collection. Pituitary gland and suprasellar region within normal limits. Vascular: Major intracranial vascular flow voids are maintained. Skull and upper cervical spine: Craniocervical junction within normal limits. Bone marrow signal intensity normal. No scalp soft tissue abnormality. Sinuses/Orbits: Patient status post bilateral ocular lens replacement. Scattered mucosal thickening noted about the paranasal sinuses. No significant mastoid effusion. Other: None. IMPRESSION: 1. 2.3 cm acute ischemic nonhemorrhagic left basal ganglia infarct. 2. Underlying age-related cerebral atrophy with mild chronic small  vessel ischemic disease. Electronically Signed   By: Jeannine Boga M.D.   On: 07/19/2022 23:54   CT HEAD WO CONTRAST  Result Date: 07/19/2022 CLINICAL DATA:  Mental status change, persistent or worsening EXAM: CT HEAD WITHOUT CONTRAST TECHNIQUE: Contiguous axial images were obtained from the base of the skull through the vertex without intravenous contrast. RADIATION DOSE REDUCTION: This exam was performed according to the departmental dose-optimization program which includes automated exposure control, adjustment of the mA and/or kV according to patient size and/or use of iterative reconstruction technique. COMPARISON:  CT head 07/09/2021. FINDINGS: Motion limited study. Brain: No evidence of acute large vascular territory infarct, acute hemorrhage, mass lesion, midline shift or hydrocephalus. Cerebral atrophy with ex vacuo ventricular dilation. Confluent and patchy hypodensities in the white matter, nonspecific but likely the sequela of chronic microvascular ischemic disease. Vascular: No hyperdense vessel identified. Calcific intracranial atherosclerosis. Skull: No acute fracture. Sinuses/Orbits: Largely clear sinuses.  No acute orbital findings. Other: No mastoid effusions. IMPRESSION: 1. No evidence of acute intracranial abnormality. 2. Similar chronic microvascular ischemic disease and cerebral atrophy (ICD10-G31.9). Electronically Signed   By: Margaretha Sheffield M.D.   On: 07/19/2022 15:52   DG Chest Port 1 View  Result Date: 07/19/2022 CLINICAL DATA:  Altered mental status. EXAM: PORTABLE CHEST 1 VIEW COMPARISON:  December 05, 2021. FINDINGS: Stable cardiomediastinal silhouette. Status post coronary bypass graft. Mild bibasilar subsegmental atelectasis or infiltrates are noted. Bony thorax is unremarkable. IMPRESSION: Mild bibasilar subsegmental atelectasis or infiltrates. Electronically Signed   By: Marijo Conception M.D.   On: 07/19/2022 14:38     PHYSICAL EXAM  Temp:  [97.6 F (36.4  C)-98.4 F (36.9 C)] 98 F (36.7 C) (09/22 1927) Pulse Rate:  [58-91] 62 (09/22 1927) Resp:  [16-18] 16 (09/22 1927) BP: (100-163)/(59-98) 163/73 (09/22 1927) SpO2:  [76 %-100 %] 100 % (09/22 1927)  General - Well nourished, well developed, in no apparent distress.  Ophthalmologic - fundi not visualized due to noncooperation.  Cardiovascular - irregular HR on tele   Neuro - awake, alert, eyes open, orientated to place only, not oriented to time, age, situation. No aphasia, paucity of speech, following all simple commands but significant psychomotor slowing. Able to name and repeat in delayed fashion.  Mild to moderate dysarthria.  No gaze palsy, tracking bilaterally, blinking to visual threat bilaterally. No facial droop. Tongue midline. Bilateral UEs 3/5, slow drift bilaterally. Bilaterally LEs withdraw to pain seems symmetrical. Sensation symmetrical bilaterally subjectively but slow to response, b/l FTN intact grossly, gait not tested.     ASSESSMENT/PLAN [name] is a 87 y.o. male with history of CAD status post CABG, DM, hypertension, hyperlipidemia, PAD status post stents and femoral endarterectomy to BLE admitted for altered mental status for 2 days, not speaking much. No tPA given due to outside  window.    Stroke:  left BG/CR infarct, likely secondary to large vessel disease given location and vascular stenosis CT no acute abnormality MRI left BG/CR infarct CT head and neck left proximal M1 near occlusion with reconstitution of distal M1 and M2.  Right ICA 75%, left ICA 90% stenosis.  Left subclavian moderate stenosis proximal to left VA takeoff.  Bilateral ICA siphon severe atherosclerosis 2D Echo EF 60 to 65% LDL 104 HgbA1c 6.5 UDS negative Lovenox for VTE prophylaxis clopidogrel 75 mg daily prior to admission, now on aspirin 325 mg daily and clopidogrel 75 mg daily DAPT due to severe intracranial and extracranial stenosis.  Duration of regimen per VVS. Ongoing  aggressive stroke risk factor management Therapy recommendations: SNF Disposition: Pending  Severe intracranial and extraluminal stenosis CT head and neck left proximal M1 near occlusion with reconstitution of distal M1 and M2.  Right ICA 75%, left ICA 90% stenosis.  Left subclavian moderate stenosis proximal to left VA takeoff.  Bilateral ICA siphon severe atherosclerosis Left ICA 90% stenosis and left M1 near occlusion likely to be the contributor for left BG/CR infarct this time Vascular surgery Dr. Carlis Abbott consulted Planning for TCAR on the left next week Continue DAPT  ?  A-fib EKG concerning for atrial arrhythmia Overnight no evidence of afib Dr. Debara Pickett on board, difficult to confirm A-fib at this time.   Start metoprolol XL 12.5 mg daily per card Recommend continued telemetry monitoring and outpatient cardiac monitoring if negative for A-fib during admission  Diabetes HgbA1c 6.5 goal < 7.0 Controlled CBG monitoring SSI DM education and close PCP follow up  Hypertension Stable 130-150s Avoid low BP Long term BP goal 130-150 given severe intracranial and extracranial vascular stenosis  Hyperlipidemia Home meds: None LDL 104, goal < 70 Statin intolerance to Lipitor, Crestor and Zocor Now on Zetia Continue Zetia at discharge  Other Stroke Risk Factors Advanced age CAD status post CABG PAD status post stenting and femoral artery endarterectomy  Other Active Problems CXR showed pneumonia, on Rocephin and azithromycin  Hospital day # 2  Neurology will sign off. Please call with questions. Pt will follow up with Dr. Jaynee Eagles at [facility] in about 4 weeks. Thanks for the consult.   Rosalin Hawking, MD PhD Stroke Neurology 07/22/2022 9:34 PM    To contact Stroke Continuity provider, please refer to [email or web address] After hours, contact General Neurology

## 2022-07-22 NOTE — Progress Notes (Signed)
Physical Therapy Treatment Patient Details Name: [name] MRN: [id number] DOB: 1935/08/09 Today's Date: 07/22/2022   History of Present Illness Pt is an 87 y/o male presenting with increased confusion. Imaging showed L BG infarct. PMH: CAD, DM, GERD, dementia, HTN, PAD , R TKA    PT Comments    Patient progressing some at EOB during session working on anterior weight shift, postural awareness, trunk flexibility and some for LE strength with sit to stand to [name].  Seen with OT for improved handling techniques and for safety.  Son in the room and supportive.  Patient remains appropriate for STSNF level rehab at d/c.  PT to continue to follow acutely.   Recommendations for follow up therapy are one component of a multi-disciplinary discharge planning process, led by the attending physician.  Recommendations may be updated based on patient status, additional functional criteria and insurance authorization.  Follow Up Recommendations  Skilled nursing-short term rehab (<3 hours/day) Can patient physically be transported by private vehicle: No   Assistance Recommended at Discharge Frequent or constant Supervision/Assistance  Patient can return home with the following Two people to help with walking and/or transfers;Two people to help with bathing/dressing/bathroom;Assistance with cooking/housework;Help with stairs or ramp for entrance;Assist for transportation   Equipment Recommendations  None recommended by PT    Recommendations for Other Services       Precautions / Restrictions Precautions Precautions: Fall Precaution Comments: retropulsion     Mobility  Bed Mobility Overal bed mobility: Needs Assistance Bed Mobility: Supine to Sit     Supine to sit: Max assist, HOB elevated Sit to supine: Mod assist, +2 for physical assistance   General bed mobility comments: assist for legs off bed and to lift trunk (heavy lift) from bed; to supine, assist for legs and trunk positioning.     Transfers Overall transfer level: Needs assistance   Transfers: Sit to/from Stand Sit to Stand: From elevated surface           General transfer comment: used [place] for sit to stand, to initiate anterior weight shift and to block feet due to posterior bias; +2 for lifting, encouraging COG over BOS and for balance due to L lateral lean    Ambulation/Gait                   Stairs             Wheelchair Mobility    Modified Rankin (Stroke Patients Only) Modified Rankin (Stroke Patients Only) Pre-Morbid Rankin Score: Moderately severe disability Modified Rankin: Severe disability     Balance Overall balance assessment: Needs assistance   Sitting balance-Leahy Scale: Poor Sitting balance - Comments: Variable Min-Max A for sitting balance; extended time on EOB working on anterior weight shift with variety of techniques, stretching low back forward with manual assist, having pt pull on belt around therapist's waist, having pt place forehead on therapist forehead, working on postural awareness and trunk flexibility Postural control: Posterior lean, Left lateral lean Standing balance support: Bilateral upper extremity supported Standing balance-Leahy Scale: Zero Standing balance comment: +2 and UE support for standing due to posterior and L lateral lean                            Cognition Arousal/Alertness: Awake/alert Behavior During Therapy: Flat affect Overall Cognitive Status: History of cognitive impairments - at baseline  General Comments: hx of dementia, follows one step directions fairly consistently. minimally verbal but does answer direct questions        Exercises      General Comments General comments (skin integrity, edema, etc.): son present to assist with mentation and telesitter in the room; condom cath on but leaky, HR up to 114 with activity, no SOB noted on RA      Pertinent  Vitals/Pain Pain Assessment Pain Assessment: No/denies pain    Home Living                          Prior Function            PT Goals (current goals can now be found in the care plan section) Progress towards PT goals: Progressing toward goals    Frequency    Min 3X/week      PT Plan Current plan remains appropriate    Co-evaluation PT/OT/SLP Co-Evaluation/Treatment: Yes Reason for Co-Treatment: Complexity of the patient's impairments (multi-system involvement);For patient/therapist safety;To address functional/ADL transfers          AM-PAC PT "6 Clicks" Mobility   Outcome Measure  Help needed turning from your back to your side while in a flat bed without using bedrails?: Total Help needed moving from lying on your back to sitting on the side of a flat bed without using bedrails?: Total Help needed moving to and from a bed to a chair (including a wheelchair)?: Total Help needed standing up from a chair using your arms (e.g., wheelchair or bedside chair)?: Total Help needed to walk in hospital room?: Total Help needed climbing 3-5 steps with a railing? : Total 6 Click Score: 6    End of Session Equipment Utilized During Treatment: Gait belt Activity Tolerance: Patient tolerated treatment well Patient left: in bed;with call bell/phone within reach;with family/visitor present;with bed alarm set (partial upright/chair position)   PT Visit Diagnosis: Unsteadiness on feet (R26.81);Muscle weakness (generalized) (M62.81);Difficulty in walking, not elsewhere classified (R26.2)     Time: 4193-7902 PT Time Calculation (min) (ACUTE ONLY): 24 min  Charges:  $Therapeutic Activity: 8-22 mins                     [name], PT Acute Rehabilitation Services Office:[phone] 07/22/2022    [name] 07/22/2022, 10:23 AM

## 2022-07-22 NOTE — Progress Notes (Addendum)
  Progress Note    07/22/2022 7:45 AM * No surgery found *  Subjective:  no complaints this morning.  Still in mittens   Vitals:   07/21/22 2353 07/22/22 0443  BP: (!) 155/98 (!) 159/73  Pulse: 91 73  Resp: 17 17  Temp: 97.6 F (36.4 C) 97.6 F (36.4 C)  SpO2: 92% 100%   Physical Exam: Lungs:  non labored Extremities:  moving all extremities well Neurologic: some confusion this morning but alert  CBC    Component Value Date/Time   WBC 8.6 07/22/2022 0237   RBC 4.27 07/22/2022 0237   HGB 13.0 07/22/2022 0237   HCT 36.6 (L) 07/22/2022 0237   PLT 195 07/22/2022 0237   MCV 85.7 07/22/2022 0237   MCH 30.4 07/22/2022 0237   MCHC 35.5 07/22/2022 0237   RDW 13.1 07/22/2022 0237   LYMPHSABS 2.5 07/22/2022 0237   MONOABS 0.7 07/22/2022 0237   EOSABS 0.3 07/22/2022 0237   BASOSABS 0.1 07/22/2022 0237    BMET    Component Value Date/Time   NA 135 07/22/2022 0237   K 4.3 07/22/2022 0237   CL 100 07/22/2022 0237   CO2 24 07/22/2022 0237   GLUCOSE 112 (H) 07/22/2022 0237   BUN 17 07/22/2022 0237   CREATININE 1.09 07/22/2022 0237   CALCIUM 9.4 07/22/2022 0237   GFRNONAA >60 07/22/2022 0237   GFRAA >60 03/24/2015 0300    INR    Component Value Date/Time   INR 1.13 03/20/2015 1014     Intake/Output Summary (Last 24 hours) at 07/22/2022 0745 Last data filed at 07/22/2022 0444 Gross per 24 hour  Intake 753.23 ml  Output 1145 ml  Net -391.77 ml     Assessment/Plan:  87 y.o. male with symptomatic L ICA stenosis   Some confusion this morning but moving all ext well Tentative plan for L TCAR on Monday Continue aspirin, statin, plavix   Dagoberto Ligas, PA-C [facility] [phone] 07/22/2022 7:45 AM  I have seen and evaluated the patient. I agree with the PA note as documented above.  87 year old male that vascular surgery was consulted for severe bilateral ICA stenosis with evidence of a left basal ganglia infarct.  I have discussed this  case with neurology and we both agree this could be from his carotid artery disease versus small vessel intracranial disease.  I discussed options of medical management versus surgical management with family.  They wish to proceed with surgical intervention.  I feel given his age and debilitated state he needs the least invasive approach and I have posted him for left TCAR on Monday with possible shockwave lithotripsy due to calcified lesion.  Discussed carotid stenting with flow reversal.  Discussed 1% perioperative stroke risk.  He was living at home prior to this admission and could ambulate with a walker with assistance.  The family's goal is to get him back home again.  Aspirin, statin, plavix for tcar.  Do not hold dual antiplatelet therapy as this will be continued through surgery.  Marty Heck, MD [facility] Office: [phone]

## 2022-07-22 NOTE — Plan of Care (Signed)
  Problem: Education: Goal: Ability to describe self-care measures that may prevent or decrease complications (Diabetes Survival Skills Education) will improve Outcome: Progressing Goal: Individualized Educational Video(s) Outcome: Progressing   Problem: Coping: Goal: Ability to adjust to condition or change in health will improve Outcome: Progressing   Problem: Fluid Volume: Goal: Ability to maintain a balanced intake and output will improve Outcome: Progressing   Problem: Health Behavior/Discharge Planning: Goal: Ability to identify and utilize available resources and services will improve Outcome: Progressing Goal: Ability to manage health-related needs will improve Outcome: Progressing   Problem: Metabolic: Goal: Ability to maintain appropriate glucose levels will improve Outcome: Progressing   Problem: Nutritional: Goal: Maintenance of adequate nutrition will improve Outcome: Progressing Goal: Progress toward achieving an optimal weight will improve Outcome: Progressing   Problem: Skin Integrity: Goal: Risk for impaired skin integrity will decrease Outcome: Progressing   Problem: Tissue Perfusion: Goal: Adequacy of tissue perfusion will improve Outcome: Progressing   Problem: Activity: Goal: Ability to tolerate increased activity will improve Outcome: Progressing   Problem: Clinical Measurements: Goal: Ability to maintain a body temperature in the normal range will improve Outcome: Progressing   Problem: Respiratory: Goal: Ability to maintain adequate ventilation will improve Outcome: Progressing Goal: Ability to maintain a clear airway will improve Outcome: Progressing   Problem: Education: Goal: Knowledge of General Education information will improve Description: Including pain rating scale, medication(s)/side effects and non-pharmacologic comfort measures Outcome: Progressing   Problem: Health Behavior/Discharge Planning: Goal: Ability to manage  health-related needs will improve Outcome: Progressing   Problem: Clinical Measurements: Goal: Ability to maintain clinical measurements within normal limits will improve Outcome: Progressing Goal: Will remain free from infection Outcome: Progressing Goal: Diagnostic test results will improve Outcome: Progressing Goal: Respiratory complications will improve Outcome: Progressing Goal: Cardiovascular complication will be avoided Outcome: Progressing   Problem: Activity: Goal: Risk for activity intolerance will decrease Outcome: Progressing   Problem: Nutrition: Goal: Adequate nutrition will be maintained Outcome: Progressing   Problem: Coping: Goal: Level of anxiety will decrease Outcome: Progressing   Problem: Elimination: Goal: Will not experience complications related to bowel motility Outcome: Progressing Goal: Will not experience complications related to urinary retention Outcome: Progressing   Problem: Pain Managment: Goal: General experience of comfort will improve Outcome: Progressing   Problem: Safety: Goal: Ability to remain free from injury will improve Outcome: Progressing   Problem: Skin Integrity: Goal: Risk for impaired skin integrity will decrease Outcome: Progressing   

## 2022-07-23 DIAGNOSIS — G9340 Encephalopathy, unspecified: Secondary | ICD-10-CM | POA: Diagnosis not present

## 2022-07-23 DIAGNOSIS — I6523 Occlusion and stenosis of bilateral carotid arteries: Secondary | ICD-10-CM | POA: Diagnosis not present

## 2022-07-23 LAB — CBC WITH DIFFERENTIAL/PLATELET
Abs Immature Granulocytes: 0.04 10*3/uL (ref 0.00–0.07)
Basophils Absolute: 0.1 10*3/uL (ref 0.0–0.1)
Basophils Relative: 1 %
Eosinophils Absolute: 0.2 10*3/uL (ref 0.0–0.5)
Eosinophils Relative: 3 %
HCT: 39.3 % (ref 39.0–52.0)
Hemoglobin: 13.6 g/dL (ref 13.0–17.0)
Immature Granulocytes: 1 %
Lymphocytes Relative: 20 %
Lymphs Abs: 1.7 10*3/uL (ref 0.7–4.0)
MCH: 29.9 pg (ref 26.0–34.0)
MCHC: 34.6 g/dL (ref 30.0–36.0)
MCV: 86.4 fL (ref 80.0–100.0)
Monocytes Absolute: 0.7 10*3/uL (ref 0.1–1.0)
Monocytes Relative: 8 %
Neutro Abs: 6 10*3/uL (ref 1.7–7.7)
Neutrophils Relative %: 67 %
Platelets: 219 10*3/uL (ref 150–400)
RBC: 4.55 MIL/uL (ref 4.22–5.81)
RDW: 13 % (ref 11.5–15.5)
WBC: 8.7 10*3/uL (ref 4.0–10.5)
nRBC: 0 % (ref 0.0–0.2)

## 2022-07-23 LAB — COMPREHENSIVE METABOLIC PANEL
ALT: 11 U/L (ref 0–44)
AST: 20 U/L (ref 15–41)
Albumin: 3.4 g/dL — ABNORMAL LOW (ref 3.5–5.0)
Alkaline Phosphatase: 69 U/L (ref 38–126)
Anion gap: 9 (ref 5–15)
BUN: 15 mg/dL (ref 8–23)
CO2: 26 mmol/L (ref 22–32)
Calcium: 9.1 mg/dL (ref 8.9–10.3)
Chloride: 99 mmol/L (ref 98–111)
Creatinine, Ser: 1.02 mg/dL (ref 0.61–1.24)
GFR, Estimated: >60 mL/min (ref >60)
Glucose, Bld: 100 mg/dL — ABNORMAL HIGH (ref 70–99)
Potassium: 3.6 mmol/L (ref 3.5–5.1)
Sodium: 134 mmol/L — ABNORMAL LOW (ref 135–145)
Total Bilirubin: 0.5 mg/dL (ref 0.3–1.2)
Total Protein: 6.6 g/dL (ref 6.5–8.1)

## 2022-07-23 LAB — GLUCOSE, CAPILLARY
Glucose-Capillary: 129 mg/dL — ABNORMAL HIGH (ref 70–99)
Glucose-Capillary: 117 mg/dL — ABNORMAL HIGH (ref 70–99)
Glucose-Capillary: 136 mg/dL — ABNORMAL HIGH (ref 70–99)
Glucose-Capillary: 133 mg/dL — ABNORMAL HIGH (ref 70–99)

## 2022-07-23 LAB — PHOSPHORUS: Phosphorus: 3.3 mg/dL (ref 2.5–4.6)

## 2022-07-23 LAB — MAGNESIUM: Magnesium: 2 mg/dL (ref 1.7–2.4)

## 2022-07-23 MED ORDER — TRAZODONE HCL 50 MG PO TABS
50.0000 mg | ORAL_TABLET | Freq: Every day | ORAL | Status: DC
  Administered 2022-07-23 – 2022-07-25 (×3): 50 mg via ORAL
  Filled 2022-07-23 (×3): qty 1

## 2022-07-23 NOTE — Progress Notes (Signed)
PROGRESS NOTE    [name]  UEA:[id number] DOB: [birth year] DOA: 07/19/2022 PCP: Ginger Organ., MD  Chief Complaint  Patient presents with   Altered Mental Status    Brief Narrative:  [name] is [name] 87 y.o. male with medical history significant of CAD, T2DM, GERD, HLD, HTN, PAD, hx of CVA who presented to ED with change in mental status two days ago.  For 2 days he has been lethargic and confused. His caregiver also thought he had Rodneshia Greenhouse left facial droop and so did the hairdresser. She thinks this is better. He has chronic left sided weakness and this seems unchanged.   He has stopped talking almost completely. He typically will converse with his wife some, but this is abnormal for him. He typically walks with Carolynne Schuchard walker, but 2 days ago was in wheel chair.    At baseline he does talk, but very little. He walks around the house with walker. He needs full assist x 2 for showering. He needs help with dressing. He can feed himself. No falls in the last year.    He has not had any fever/chills. No open sores or bed sores. He has been coughing for 2 days that is dry,but at times is clear phlegm. No odor to urine, unsure of color change. He has been eating and drinking well. No N/V/D.      He does not smoke or drink alcohol.    ER Course:  vitals: afebrile, bp: 135/61, HR; 69, RR: 20, oxygen: 98%RA Pertinent labs: BUN: 33, creatinine: 1.29,  CXR: mild bibasilar subsegmental atelectasis or infiltrates CT head: no acute finding  In ED: given 1L IVF and rocephin/azithromycin. BC obtained. TRH asked to admit.   Assessment & Plan:   Principal Problem:   Acute encephalopathy Active Problems:   Pulmonary infiltrates   Type 2 diabetes mellitus with stage 3a chronic kidney disease, without long-term current use of insulin (HCC)   Coronary artery disease involving native heart without angina pectoris   Essential hypertension   Chronic kidney disease, stage 3a (HCC)   Dementia without  behavioral disturbance ([place])   GERD without esophagitis   Stroke ([place])   Assessment and Plan: Left Basal Ganglia Infarct ? Related to encephalopathy below MRI with 2.3 cm acute ischemic L basal ganglia infarct CTA head/neck with near occlusion of proximal M1 segment of L MCA with reconstitution of distal M1 and M2 segments, severe stenosis at origin of bilateral internal carotid arteries Echo 60-65, no RWMA, moderate to severe mitral regurg  A1c 6.5, lipid panel pending, TSH wnl PT/OT/SLP Continue tele monitoring - no clear evidence of afib, cards recommending low dose metoprolol daily as tolerated by BP, consider outpatient monitoring for afib after discharge Vascular evaluating per neuro given severe (90%) stenosis on the L Already on plavix, aspirin started.  Has not tolerated statins in the past.  Zetia. Appreciate neurology assistance  Dysphagia SLP recommending dysphagia 3, thin liquids Will follow SLP recs They note hx of vomiting after eating for about 1 year, esophagram   Acute encephalopathy  History of Dementia Related to stroke vs pneumonia MRI as above UA not c/w UTI TSH wnl B12 low normal, follow MMA - supplement with oral b12 while awaiting results PT/OT for weakness Wife concerned about sleepiness, he'd woken up for me and seemed to be at his baseline, ? Delirium, will follow and workup as appropriate Delirium precautions   Pulmonary infiltrates Being treated for possible pneumonia Patient is  afebrile with no leukocytosis, normal lactic acid, has had [place] cough  CAP coverage, s/p 5 days abx Urine strep negative, urine legionella negative Negative RVP negative covid/flu Blood cx pending   Type 2 diabetes mellitus with stage 3a chronic kidney disease, without long-term current use of insulin (HCC) A1c 6.5 Hold metformin while inpatient SSI and accuchecks QAC/HS    Coronary artery disease involving native heart without angina pectoris Hx of CABG Can not  tolerate statins Continue toprol-xl and plavix    Essential hypertension Soft blood pressures Metoprolol as above Ramipril on hold   Chronic kidney disease, stage 3a (HCC) At baseline, continue to monitor    Dementia without behavioral disturbance ([place]) delerium precautions    GERD without esophagitis Continue protonix    Moderate to Severe MR Follow outpatient with cards     DVT prophylaxis: lovenox Code Status: full Family Communication: wife over phone  Disposition:   Status is: Observation The patient remains OBS appropriate and will d/c before 2 midnights.   Consultants:  neurology  Procedures:  none  Antimicrobials:  Anti-infectives (From admission, onward)    Start     Dose/Rate Route Frequency Ordered Stop   07/20/22 1745  cefTRIAXone (ROCEPHIN) 2 g in sodium chloride 0.9 % 100 mL IVPB        2 g 200 mL/hr over 30 Minutes Intravenous Every 24 hours 07/19/22 1744 07/25/22 1744   07/20/22 1600  azithromycin (ZITHROMAX) 500 mg in sodium chloride 0.9 % 250 mL IVPB        500 mg 250 mL/hr over 60 Minutes Intravenous Every 24 hours 07/19/22 1745 07/25/22 1559   07/19/22 1615  cefTRIAXone (ROCEPHIN) 2 g in sodium chloride 0.9 % 100 mL IVPB        2 g 200 mL/hr over 30 Minutes Intravenous  Once 07/19/22 1607 07/19/22 1725   07/19/22 1615  azithromycin (ZITHROMAX) 500 mg in sodium chloride 0.9 % 250 mL IVPB        500 mg 250 mL/hr over 60 Minutes Intravenous  Once 07/19/22 1607 07/19/22 1742       Subjective: No new complaints  Objective: Vitals:   07/23/22 0345 07/23/22 0815 07/23/22 1204 07/23/22 1450  BP: (!) 143/80 (!) 161/82 (!) 145/88 127/69  Pulse: 90 84 81 71  Resp: '18 18  20  '$ Temp: 98 F (36.7 C) 98.2 F (36.8 C)  98.4 F (36.9 C)  TempSrc: Oral     SpO2: 100%  99% 96%  Weight:      Height:        Intake/Output Summary (Last 24 hours) at 07/23/2022 1757 Last data filed at 07/23/2022 0428 Gross per 24 hour  Intake 217.04 ml  Output  850 ml  Net -632.96 ml   Filed Weights   07/19/22 1753  Weight: 79.4 kg    Examination:  General: No acute distress. Lungs: unlabored Neurological: pleasantly confused, moving all extremities Extremities: No clubbing or cyanosis. No edema.  Data Reviewed: I have personally reviewed following labs and imaging studies  CBC: Recent Labs  Lab 07/19/22 1504 07/19/22 1537 07/20/22 0335 07/21/22 0338 07/22/22 0237 07/23/22 0917  WBC 8.4  --  8.0 7.9 8.6 8.7  NEUTROABS 5.3  --   --  4.3 5.0 6.0  HGB 13.2 13.3 12.5* 12.1* 13.0 13.6  HCT 39.7 39.0 36.4* 34.2* 36.6* 39.3  MCV 90.4  --  88.6 85.7 85.7 86.4  PLT 208  --  171 176 195 219  Basic Metabolic Panel: Recent Labs  Lab 07/19/22 1504 07/19/22 1537 07/20/22 0335 07/21/22 0338 07/22/22 0237 07/23/22 0917  NA 134* 133* 138 134* 135 134*  K 4.9 5.9* 4.2 3.6 4.3 3.6  CL 101  --  105 104 100 99  CO2 24  --  24 21* 24 26  GLUCOSE 186*  --  105* 111* 112* 100*  BUN 33*  --  25* '19 17 15  '$ CREATININE 1.29*  --  1.10 0.91 1.09 1.02  CALCIUM 9.3  --  9.2 8.7* 9.4 9.1  MG  --   --   --  1.4* 1.8 2.0  PHOS  --   --   --  2.8 3.5 3.3    GFR: Estimated Creatinine Clearance: 59.5 mL/min (by C-G formula based on SCr of 1.02 mg/dL).  Liver Function Tests: Recent Labs  Lab 07/19/22 1504 07/21/22 0338 07/22/22 0237 07/23/22 0917  AST '25 19 20 20  '$ ALT '10 9 11 11  '$ ALKPHOS 74 68 70 69  BILITOT 0.8 0.5 0.9 0.5  PROT 6.9 6.1* 6.7 6.6  ALBUMIN 3.6 3.2* 3.5 3.4*    CBG: Recent Labs  Lab 07/22/22 1631 07/22/22 2155 07/23/22 0610 07/23/22 1205 07/23/22 1659  GLUCAP 114* 139* 129* 117* 136*     Recent Results (from the past 240 hour(s))  Blood Culture (Routine X 2)     Status: None (Preliminary result)   Collection Time: 07/19/22  2:50 PM   Specimen: BLOOD  Result Value Ref Range Status   Specimen Description BLOOD SITE NOT SPECIFIED  Final   Special Requests   Final    BOTTLES DRAWN AEROBIC AND ANAEROBIC  Blood Culture adequate volume   Culture   Final    NO GROWTH 4 DAYS Performed at [facility], [place] [address], [place], [place] [address]    Report Status PENDING  Incomplete  Blood Culture (Routine X 2)     Status: None (Preliminary result)   Collection Time: 07/19/22  3:00 PM   Specimen: BLOOD  Result Value Ref Range Status   Specimen Description BLOOD SITE NOT SPECIFIED  Final   Special Requests   Final    BOTTLES DRAWN AEROBIC AND ANAEROBIC Blood Culture results may not be optimal due to an inadequate volume of blood received in culture bottles   Culture   Final    NO GROWTH 4 DAYS Performed at [facility], [place] [address], [place], [place] [address]    Report Status PENDING  Incomplete  Resp Panel by RT-PCR (Flu Lela Murfin&B, Covid) Anterior Nasal Swab     Status: None   Collection Time: 07/19/22  3:36 PM   Specimen: Anterior Nasal Swab  Result Value Ref Range Status   SARS Coronavirus 2 by RT PCR NEGATIVE NEGATIVE Final    Comment: (NOTE) SARS-CoV-2 target nucleic acids are NOT DETECTED.  The SARS-CoV-2 RNA is generally detectable in upper respiratory specimens during the acute phase of infection. The lowest concentration of SARS-CoV-2 viral copies this assay can detect is 138 copies/mL. Mouhamed Glassco negative result does not preclude SARS-Cov-2 infection and should not be used as the sole basis for treatment or other patient management decisions. Yaneisy Wenz negative result may occur with  improper specimen collection/handling, submission of specimen other than nasopharyngeal swab, presence of viral mutation(s) within the areas targeted by this assay, and inadequate number of viral copies(<138 copies/mL). [place] Throckmorton negative result must be combined with clinical observations, patient history, and epidemiological information. The expected  result is Negative.  Fact Sheet for Patients:  EntrepreneurPulse.com.au  Fact Sheet for Healthcare Providers:   IncredibleEmployment.be  This test is no t yet approved or cleared by the [place] FDA and  has been authorized for detection and/or diagnosis of SARS-CoV-2 by FDA under an Emergency Use Authorization (EUA). This EUA will remain  in effect (meaning this test can be used) for the duration of the COVID-19 declaration under Section 564(b)(1) of the Act, 21 U.S.C.section 360bbb-3(b)(1), unless the authorization is terminated  or revoked sooner.       Influenza Jovani Flury by PCR NEGATIVE NEGATIVE Final   Influenza B by PCR NEGATIVE NEGATIVE Final    Comment: (NOTE) The Xpert Xpress SARS-CoV-2/FLU/RSV plus assay is intended as an aid in the diagnosis of influenza from Nasopharyngeal swab specimens and should not be used as Mikinzie Maciejewski sole basis for treatment. Nasal washings and aspirates are unacceptable for Xpert Xpress SARS-CoV-2/FLU/RSV testing.  Fact Sheet for Patients: EntrepreneurPulse.com.au  Fact Sheet for Healthcare Providers: IncredibleEmployment.be  This test is not yet approved or cleared by the [place] FDA and has been authorized for detection and/or diagnosis of SARS-CoV-2 by FDA under an Emergency Use Authorization (EUA). This EUA will remain in effect (meaning this test can be used) for the duration of the COVID-19 declaration under Section 564(b)(1) of the Act, 21 U.S.C. section 360bbb-3(b)(1), unless the authorization is terminated or revoked.  Performed at [facility], [place] [address], [place], [place] [address]   Respiratory (~20 pathogens) panel by PCR     Status: None   Collection Time: 07/19/22  3:36 PM   Specimen: Nasopharyngeal Swab; Respiratory  Result Value Ref Range Status   Adenovirus NOT DETECTED NOT DETECTED Final   Coronavirus 229E NOT DETECTED NOT DETECTED Final    Comment: (NOTE) The Coronavirus on the Respiratory Panel, DOES NOT test for the novel  Coronavirus (2019 nCoV)    Coronavirus  HKU1 NOT DETECTED NOT DETECTED Final   Coronavirus NL63 NOT DETECTED NOT DETECTED Final   Coronavirus OC43 NOT DETECTED NOT DETECTED Final   Metapneumovirus NOT DETECTED NOT DETECTED Final   Rhinovirus / Enterovirus NOT DETECTED NOT DETECTED Final   Influenza Kynleigh Artz NOT DETECTED NOT DETECTED Final   Influenza B NOT DETECTED NOT DETECTED Final   Parainfluenza Virus 1 NOT DETECTED NOT DETECTED Final   Parainfluenza Virus 2 NOT DETECTED NOT DETECTED Final   Parainfluenza Virus 3 NOT DETECTED NOT DETECTED Final   Parainfluenza Virus 4 NOT DETECTED NOT DETECTED Final   Respiratory Syncytial Virus NOT DETECTED NOT DETECTED Final   Bordetella pertussis NOT DETECTED NOT DETECTED Final   Bordetella Parapertussis NOT DETECTED NOT DETECTED Final   Chlamydophila pneumoniae NOT DETECTED NOT DETECTED Final   Mycoplasma pneumoniae NOT DETECTED NOT DETECTED Final    Comment: Performed at [facility], [place] [address], [place], [place] [address]         Radiology Studies: No results found.      Scheduled Meds:  aspirin  300 mg Rectal Daily   Or   aspirin  325 mg Oral Daily   clopidogrel  75 mg Oral Q supper   vitamin B-12  1,000 mcg Oral Daily   enoxaparin (LOVENOX) injection  40 mg Subcutaneous Q24H   ezetimibe  10 mg Oral Daily   insulin aspart  0-9 Units Subcutaneous TID WC   metoprolol succinate  12.5 mg Oral Q supper   pantoprazole  40 mg Oral QAC supper  Continuous Infusions:  azithromycin (ZITHROMAX) 500 mg in sodium chloride 0.9 % 250 mL IVPB 500 mg (07/23/22 1710)   cefTRIAXone (ROCEPHIN)  IV 2 g (07/22/22 1651)     LOS: 3 days    Time spent: over 30 min    Fayrene Helper, MD [facility]   To contact the attending provider between 7A-7P or the covering provider during after hours 7P-7A, please log into the web site [email or web address] and access using universal [place] password for that web site. If you do not have the password, please call the hospital  operator.  07/23/2022, 5:57 PM

## 2022-07-23 NOTE — Progress Notes (Signed)
[facility] [place] PROGRESS NOTE  ASSESSMENT / PLAN: [name] is a 87 y.o. male with symptomatic left carotid artery stenosis. Plan L TCAR Monday with Dr. Carlis Abbott. Continue ASA, Plavix, Statin. Patient must have DAPT on day of surgery. Will follow along with you.  SUBJECTIVE: Sleeping comfortably. I did not awake because of issues with delirium.  OBJECTIVE: BP (!) 161/82 (BP Location: Left Arm)   Pulse 84   Temp 98.2 F (36.8 C)   Resp 18   Ht '6\' 2"'$  (1.88 m)   Wt 79.4 kg   SpO2 100%   BMI 22.47 kg/m   Intake/Output Summary (Last 24 hours) at 07/23/2022 0826 Last data filed at 07/23/2022 0428 Gross per 24 hour  Intake 217.04 ml  Output 850 ml  Net -632.96 ml    No distress Sleeping comfortably.  Mittens in place Regular rate and rhythm Unlabored breathing     Latest Ref Rng & Units 07/22/2022    2:37 AM 07/21/2022    3:38 AM 07/20/2022    3:35 AM  CBC  WBC 4.0 - 10.5 K/uL 8.6  7.9  8.0   Hemoglobin 13.0 - 17.0 g/dL 13.0  12.1  12.5   Hematocrit 39.0 - 52.0 % 36.6  34.2  36.4   Platelets 150 - 400 K/uL 195  176  171         Latest Ref Rng & Units 07/22/2022    2:37 AM 07/21/2022    3:38 AM 07/20/2022    3:35 AM  CMP  Glucose 70 - 99 mg/dL 112  111  105   BUN 8 - 23 mg/dL '17  19  25   '$ Creatinine 0.61 - 1.24 mg/dL 1.09  0.91  1.10   Sodium 135 - 145 mmol/L 135  134  138   Potassium 3.5 - 5.1 mmol/L 4.3  3.6  4.2   Chloride 98 - 111 mmol/L 100  104  105   CO2 22 - 32 mmol/L '24  21  24   '$ Calcium 8.9 - 10.3 mg/dL 9.4  8.7  9.2   Total Protein 6.5 - 8.1 g/dL 6.7  6.1    Total Bilirubin 0.3 - 1.2 mg/dL 0.9  0.5    Alkaline Phos 38 - 126 U/L 70  68    AST 15 - 41 U/L 20  19    ALT 0 - 44 U/L 11  9      Estimated Creatinine Clearance: 55.6 mL/min (by C-G formula based on SCr of 1.09 mg/dL).  [name]. Stanford Breed, MD [facility] Phone Number: [phone] 07/23/2022 8:26 AM

## 2022-07-24 DIAGNOSIS — G9340 Encephalopathy, unspecified: Secondary | ICD-10-CM | POA: Diagnosis not present

## 2022-07-24 LAB — COMPREHENSIVE METABOLIC PANEL
ALT: 11 U/L (ref 0–44)
AST: 18 U/L (ref 15–41)
Albumin: 3.3 g/dL — ABNORMAL LOW (ref 3.5–5.0)
Alkaline Phosphatase: 67 U/L (ref 38–126)
Anion gap: 11 (ref 5–15)
BUN: 19 mg/dL (ref 8–23)
CO2: 23 mmol/L (ref 22–32)
Calcium: 9.1 mg/dL (ref 8.9–10.3)
Chloride: 101 mmol/L (ref 98–111)
Creatinine, Ser: 1.07 mg/dL (ref 0.61–1.24)
GFR, Estimated: >60 mL/min (ref >60)
Glucose, Bld: 125 mg/dL — ABNORMAL HIGH (ref 70–99)
Potassium: 3.9 mmol/L (ref 3.5–5.1)
Sodium: 135 mmol/L (ref 135–145)
Total Bilirubin: 0.5 mg/dL (ref 0.3–1.2)
Total Protein: 6.2 g/dL — ABNORMAL LOW (ref 6.5–8.1)

## 2022-07-24 LAB — CBC WITH DIFFERENTIAL/PLATELET
Abs Immature Granulocytes: 0.06 10*3/uL (ref 0.00–0.07)
Basophils Absolute: 0.1 10*3/uL (ref 0.0–0.1)
Basophils Relative: 1 %
Eosinophils Absolute: 0.2 10*3/uL (ref 0.0–0.5)
Eosinophils Relative: 2 %
HCT: 38.3 % — ABNORMAL LOW (ref 39.0–52.0)
Hemoglobin: 13.2 g/dL (ref 13.0–17.0)
Immature Granulocytes: 1 %
Lymphocytes Relative: 24 %
Lymphs Abs: 2.5 10*3/uL (ref 0.7–4.0)
MCH: 29.9 pg (ref 26.0–34.0)
MCHC: 34.5 g/dL (ref 30.0–36.0)
MCV: 86.8 fL (ref 80.0–100.0)
Monocytes Absolute: 1 10*3/uL (ref 0.1–1.0)
Monocytes Relative: 10 %
Neutro Abs: 6.3 10*3/uL (ref 1.7–7.7)
Neutrophils Relative %: 62 %
Platelets: 206 10*3/uL (ref 150–400)
RBC: 4.41 MIL/uL (ref 4.22–5.81)
RDW: 13.1 % (ref 11.5–15.5)
WBC: 10.2 10*3/uL (ref 4.0–10.5)
nRBC: 0 % (ref 0.0–0.2)

## 2022-07-24 LAB — CULTURE, BLOOD (ROUTINE X 2)
Culture: NO GROWTH
Culture: NO GROWTH
Special Requests: ADEQUATE

## 2022-07-24 LAB — GLUCOSE, CAPILLARY
Glucose-Capillary: 140 mg/dL — ABNORMAL HIGH (ref 70–99)
Glucose-Capillary: 165 mg/dL — ABNORMAL HIGH (ref 70–99)
Glucose-Capillary: 96 mg/dL (ref 70–99)
Glucose-Capillary: 143 mg/dL — ABNORMAL HIGH (ref 70–99)

## 2022-07-24 LAB — MAGNESIUM: Magnesium: 1.9 mg/dL (ref 1.7–2.4)

## 2022-07-24 LAB — PHOSPHORUS: Phosphorus: 3.2 mg/dL (ref 2.5–4.6)

## 2022-07-24 LAB — METHYLMALONIC ACID, SERUM: Methylmalonic Acid, Quantitative: 244 nmol/L (ref 0–378)

## 2022-07-24 NOTE — Progress Notes (Signed)
PROGRESS NOTE    [name]  INO:[id number] DOB: [birth year] DOA: 07/19/2022 PCP: Ginger Organ., MD  Chief Complaint  Patient presents with   Altered Mental Status    Brief Narrative:  [name] is [name] 87 y.o. male with medical history significant of CAD, T2DM, GERD, HLD, HTN, PAD, hx of CVA who presented to ED with change in mental status two days ago.  For 2 days he has been lethargic and confused. His caregiver also thought he had [name] left facial droop and so did the hairdresser. She thinks this is better. He has chronic left sided weakness and this seems unchanged.   He has stopped talking almost completely. He typically will converse with his wife some, but this is abnormal for him. He typically walks with [name] walker, but 2 days ago was in wheel chair.    At baseline he does talk, but very little. He walks around the house with walker. He needs full assist x 2 for showering. He needs help with dressing. He can feed himself. No falls in the last year.    He has not had any fever/chills. No open sores or bed sores. He has been coughing for 2 days that is dry,but at times is clear phlegm. No odor to urine, unsure of color change. He has been eating and drinking well. No N/V/D.      He does not smoke or drink alcohol.    ER Course:  vitals: afebrile, bp: 135/61, HR; 69, RR: 20, oxygen: 98%RA Pertinent labs: BUN: 33, creatinine: 1.29,  CXR: mild bibasilar subsegmental atelectasis or infiltrates CT head: no acute finding  In ED: given 1L IVF and rocephin/azithromycin. BC obtained. TRH asked to admit.   Assessment & Plan:   Principal Problem:   Acute encephalopathy Active Problems:   Pulmonary infiltrates   Type 2 diabetes mellitus with stage 3a chronic kidney disease, without long-term current use of insulin (HCC)   Coronary artery disease involving native heart without angina pectoris   Essential hypertension   Chronic kidney disease, stage 3a (HCC)   Dementia without  behavioral disturbance ([place])   GERD without esophagitis   Stroke ([place])   Assessment and Plan: Left Basal Ganglia Infarct ? Related to encephalopathy below MRI with 2.3 cm acute ischemic L basal ganglia infarct CTA head/neck with near occlusion of proximal M1 segment of L MCA with reconstitution of distal M1 and M2 segments, severe stenosis at origin of bilateral internal carotid arteries Echo 60-65, no RWMA, moderate to severe mitral regurg  A1c 6.5, lipid panel pending, TSH wnl PT/OT/SLP Continue tele monitoring - no clear evidence of afib, cards recommending low dose metoprolol daily as tolerated by BP, consider outpatient monitoring for afib after discharge Vascular evaluating per neuro given severe (90%) stenosis on the L -> Plan for L TCAR monday Already on plavix, aspirin started.  Has not tolerated statins in the past.  Zetia. Appreciate neurology assistance  Dysphagia SLP recommending dysphagia 3, thin liquids Will follow SLP recs They note hx of vomiting after eating for about 1 year, esophagram (will plan for after vascular procedure)  Acute encephalopathy  History of Dementia Related to stroke vs pneumonia MRI as above UA not c/w UTI TSH wnl B12 low normal, follow MMA - supplement with oral b12 while awaiting results PT/OT for weakness Improved today Delirium precautions   Pulmonary infiltrates Being treated for possible pneumonia Patient is afebrile with no leukocytosis, normal lactic acid, has had Margaret Cockerill cough  CAP coverage, s/p 5 days abx Urine strep negative, urine legionella negative Negative RVP negative covid/flu Blood cx pending   Type 2 diabetes mellitus with stage 3a chronic kidney disease, without long-term current use of insulin (HCC) A1c 6.5 Hold metformin while inpatient SSI and accuchecks QAC/HS    Coronary artery disease involving native heart without angina pectoris Hx of CABG Can not tolerate statins Continue toprol-xl and plavix     Essential hypertension Soft blood pressures Metoprolol as above Ramipril on hold   Chronic kidney disease, stage 3a (HCC) At baseline, continue to monitor    Dementia without behavioral disturbance ([place]) delerium precautions    GERD without esophagitis Continue protonix    Moderate to Severe MR Follow outpatient with cards     DVT prophylaxis: lovenox Code Status: full Family Communication: wife, caregiver at bedside  Disposition:   Status is: Observation The patient remains OBS appropriate and will d/c before 2 midnights.   Consultants:  neurology  Procedures:  none  Antimicrobials:  Anti-infectives (From admission, onward)    Start     Dose/Rate Route Frequency Ordered Stop   07/20/22 1745  cefTRIAXone (ROCEPHIN) 2 g in sodium chloride 0.9 % 100 mL IVPB        2 g 200 mL/hr over 30 Minutes Intravenous Every 24 hours 07/19/22 1744 07/25/22 1744   07/20/22 1600  azithromycin (ZITHROMAX) 500 mg in sodium chloride 0.9 % 250 mL IVPB        500 mg 250 mL/hr over 60 Minutes Intravenous Every 24 hours 07/19/22 1745 07/25/22 1559   07/19/22 1615  cefTRIAXone (ROCEPHIN) 2 g in sodium chloride 0.9 % 100 mL IVPB        2 g 200 mL/hr over 30 Minutes Intravenous  Once 07/19/22 1607 07/19/22 1725   07/19/22 1615  azithromycin (ZITHROMAX) 500 mg in sodium chloride 0.9 % 250 mL IVPB        500 mg 250 mL/hr over 60 Minutes Intravenous  Once 07/19/22 1607 07/19/22 1742       Subjective: No complaints  Objective: Vitals:   07/23/22 0345 07/23/22 0815 07/23/22 1204 07/23/22 1450  BP: (!) 143/80 (!) 161/82 (!) 145/88 127/69  Pulse: 90 84 81 71  Resp: '18 18  20  '$ Temp: 98 F (36.7 C) 98.2 F (36.8 C)  98.4 F (36.9 C)  TempSrc: Oral     SpO2: 100%  99% 96%  Weight:      Height:        Intake/Output Summary (Last 24 hours) at 07/23/2022 1757 Last data filed at 07/23/2022 0428 Gross per 24 hour  Intake 217.04 ml  Output 850 ml  Net -632.96 ml   Filed Weights    07/19/22 1753  Weight: 79.4 kg    Examination:  General: No acute distress. Lungs: unlabored Abdomen: Soft, nontender, nondistended  Neurological: pleasantly confused, moving all extremities Extremities: No clubbing or cyanosis. No edema.  Data Reviewed: I have personally reviewed following labs and imaging studies  CBC: Recent Labs  Lab 07/19/22 1504 07/19/22 1537 07/20/22 0335 07/21/22 0338 07/22/22 0237 07/23/22 0917  WBC 8.4  --  8.0 7.9 8.6 8.7  NEUTROABS 5.3  --   --  4.3 5.0 6.0  HGB 13.2 13.3 12.5* 12.1* 13.0 13.6  HCT 39.7 39.0 36.4* 34.2* 36.6* 39.3  MCV 90.4  --  88.6 85.7 85.7 86.4  PLT 208  --  171 176 195 924    Basic Metabolic Panel: Recent Labs  Lab  07/19/22 1504 07/19/22 1537 07/20/22 0335 07/21/22 0338 07/22/22 0237 07/23/22 0917  NA 134* 133* 138 134* 135 134*  K 4.9 5.9* 4.2 3.6 4.3 3.6  CL 101  --  105 104 100 99  CO2 24  --  24 21* 24 26  GLUCOSE 186*  --  105* 111* 112* 100*  BUN 33*  --  25* '19 17 15  '$ CREATININE 1.29*  --  1.10 0.91 1.09 1.02  CALCIUM 9.3  --  9.2 8.7* 9.4 9.1  MG  --   --   --  1.4* 1.8 2.0  PHOS  --   --   --  2.8 3.5 3.3    GFR: Estimated Creatinine Clearance: 59.5 mL/min (by C-G formula based on SCr of 1.02 mg/dL).  Liver Function Tests: Recent Labs  Lab 07/19/22 1504 07/21/22 0338 07/22/22 0237 07/23/22 0917  AST '25 19 20 20  '$ ALT '10 9 11 11  '$ ALKPHOS 74 68 70 69  BILITOT 0.8 0.5 0.9 0.5  PROT 6.9 6.1* 6.7 6.6  ALBUMIN 3.6 3.2* 3.5 3.4*    CBG: Recent Labs  Lab 07/22/22 1631 07/22/22 2155 07/23/22 0610 07/23/22 1205 07/23/22 1659  GLUCAP 114* 139* 129* 117* 136*     Recent Results (from the past 240 hour(s))  Blood Culture (Routine X 2)     Status: None (Preliminary result)   Collection Time: 07/19/22  2:50 PM   Specimen: BLOOD  Result Value Ref Range Status   Specimen Description BLOOD SITE NOT SPECIFIED  Final   Special Requests   Final    BOTTLES DRAWN AEROBIC AND ANAEROBIC Blood  Culture adequate volume   Culture   Final    NO GROWTH 4 DAYS Performed at [facility], [place] [address] [place], [place] [address]    Report Status PENDING  Incomplete  Blood Culture (Routine X 2)     Status: None (Preliminary result)   Collection Time: 07/19/22  3:00 PM   Specimen: BLOOD  Result Value Ref Range Status   Specimen Description BLOOD SITE NOT SPECIFIED  Final   Special Requests   Final    BOTTLES DRAWN AEROBIC AND ANAEROBIC Blood Culture results may not be optimal due to an inadequate volume of blood received in culture bottles   Culture   Final    NO GROWTH 4 DAYS Performed at [facility], [place] [address], [place], [place] [address]    Report Status PENDING  Incomplete  Resp Panel by RT-PCR (Flu Luberta Grabinski&B, Covid) Anterior Nasal Swab     Status: None   Collection Time: 07/19/22  3:36 PM   Specimen: Anterior Nasal Swab  Result Value Ref Range Status   SARS Coronavirus 2 by RT PCR NEGATIVE NEGATIVE Final    Comment: (NOTE) SARS-CoV-2 target nucleic acids are NOT DETECTED.  The SARS-CoV-2 RNA is generally detectable in upper respiratory specimens during the acute phase of infection. The lowest concentration of SARS-CoV-2 viral copies this assay can detect is 138 copies/mL. Malarie Tappen negative result does not preclude SARS-Cov-2 infection and should not be used as the sole basis for treatment or other patient management decisions. Roby Donaway negative result may occur with  improper specimen collection/handling, submission of specimen other than nasopharyngeal swab, presence of viral mutation(s) within the areas targeted by this assay, and inadequate number of viral copies(<138 copies/mL). Shalice Woodring negative result must be combined with clinical observations, patient history, and epidemiological information. The expected result is Negative.  Fact Sheet for  Patients:  EntrepreneurPulse.com.au  Fact Sheet for Healthcare Providers:   IncredibleEmployment.be  This test is no t yet approved or cleared by the [place] FDA and  has been authorized for detection and/or diagnosis of SARS-CoV-2 by FDA under an Emergency Use Authorization (EUA). This EUA will remain  in effect (meaning this test can be used) for the duration of the COVID-19 declaration under Section 564(b)(1) of the Act, 21 U.S.C.section 360bbb-3(b)(1), unless the authorization is terminated  or revoked sooner.       Influenza Makinsley Schiavi by PCR NEGATIVE NEGATIVE Final   Influenza B by PCR NEGATIVE NEGATIVE Final    Comment: (NOTE) The Xpert Xpress SARS-CoV-2/FLU/RSV plus assay is intended as an aid in the diagnosis of influenza from Nasopharyngeal swab specimens and should not be used as Emmary Culbreath sole basis for treatment. Nasal washings and aspirates are unacceptable for Xpert Xpress SARS-CoV-2/FLU/RSV testing.  Fact Sheet for Patients: EntrepreneurPulse.com.au  Fact Sheet for Healthcare Providers: IncredibleEmployment.be  This test is not yet approved or cleared by the [place] FDA and has been authorized for detection and/or diagnosis of SARS-CoV-2 by FDA under an Emergency Use Authorization (EUA). This EUA will remain in effect (meaning this test can be used) for the duration of the COVID-19 declaration under Section 564(b)(1) of the Act, 21 U.S.C. section 360bbb-3(b)(1), unless the authorization is terminated or revoked.  Performed at [facility], [place] [address], [place], [place] [address]   Respiratory (~20 pathogens) panel by PCR     Status: None   Collection Time: 07/19/22  3:36 PM   Specimen: Nasopharyngeal Swab; Respiratory  Result Value Ref Range Status   Adenovirus NOT DETECTED NOT DETECTED Final   Coronavirus 229E NOT DETECTED NOT DETECTED Final    Comment: (NOTE) The Coronavirus on the Respiratory Panel, DOES NOT test for the novel  Coronavirus (2019 nCoV)    Coronavirus  HKU1 NOT DETECTED NOT DETECTED Final   Coronavirus NL63 NOT DETECTED NOT DETECTED Final   Coronavirus OC43 NOT DETECTED NOT DETECTED Final   Metapneumovirus NOT DETECTED NOT DETECTED Final   Rhinovirus / Enterovirus NOT DETECTED NOT DETECTED Final   Influenza Karmelo Bass NOT DETECTED NOT DETECTED Final   Influenza B NOT DETECTED NOT DETECTED Final   Parainfluenza Virus 1 NOT DETECTED NOT DETECTED Final   Parainfluenza Virus 2 NOT DETECTED NOT DETECTED Final   Parainfluenza Virus 3 NOT DETECTED NOT DETECTED Final   Parainfluenza Virus 4 NOT DETECTED NOT DETECTED Final   Respiratory Syncytial Virus NOT DETECTED NOT DETECTED Final   Bordetella pertussis NOT DETECTED NOT DETECTED Final   Bordetella Parapertussis NOT DETECTED NOT DETECTED Final   Chlamydophila pneumoniae NOT DETECTED NOT DETECTED Final   Mycoplasma pneumoniae NOT DETECTED NOT DETECTED Final    Comment: Performed at [facility], [place]. [address] [place], [place] [address]         Radiology Studies: No results found.      Scheduled Meds:  aspirin  300 mg Rectal Daily   Or   aspirin  325 mg Oral Daily   clopidogrel  75 mg Oral Q supper   vitamin B-12  1,000 mcg Oral Daily   enoxaparin (LOVENOX) injection  40 mg Subcutaneous Q24H   ezetimibe  10 mg Oral Daily   insulin aspart  0-9 Units Subcutaneous TID WC   metoprolol succinate  12.5 mg Oral Q supper   pantoprazole  40 mg Oral QAC supper   Continuous Infusions:  azithromycin (ZITHROMAX) 500 mg  in sodium chloride 0.9 % 250 mL IVPB 500 mg (07/23/22 1710)   cefTRIAXone (ROCEPHIN)  IV 2 g (07/22/22 1651)     LOS: 3 days    Time spent: over 30 min    Fayrene Helper, MD [facility]   To contact the attending provider between 7A-7P or the covering provider during after hours 7P-7A, please log into the web site [email or web address] and access using universal [place] password for that web site. If you do not have the password, please call the hospital  operator.  07/23/2022, 5:57 PM

## 2022-07-24 NOTE — Progress Notes (Signed)
[facility] [place] PROGRESS NOTE  ASSESSMENT / PLAN: RYDELL WIEGEL is a 87 y.o. male with symptomatic left carotid artery stenosis. Plan L TCAR Monday with Dr. Carlis Abbott. Continue ASA, Plavix, Statin. Patient must have DAPT on day of surgery. NPO after midnight. Orders for consent written.  SUBJECTIVE: Resting comfortably. No complaints. No questions about surgery tomorrow.   OBJECTIVE: BP (!) 118/106 (BP Location: Right Arm)   Pulse 75   Temp 97.8 F (36.6 C) (Oral)   Resp 17   Ht '6\' 2"'$  (1.88 m)   Wt 79.4 kg   SpO2 100%   BMI 22.47 kg/m   Intake/Output Summary (Last 24 hours) at 07/24/2022 0925 Last data filed at 07/24/2022 0343 Gross per 24 hour  Intake 50 ml  Output 300 ml  Net -250 ml     No distress Sleeping comfortably.  Mittens in place Regular rate and rhythm Unlabored breathing     Latest Ref Rng & Units 07/24/2022    7:18 AM 07/23/2022    9:17 AM 07/22/2022    2:37 AM  CBC  WBC 4.0 - 10.5 K/uL 10.2  8.7  8.6   Hemoglobin 13.0 - 17.0 g/dL 13.2  13.6  13.0   Hematocrit 39.0 - 52.0 % 38.3  39.3  36.6   Platelets 150 - 400 K/uL 206  219  195         Latest Ref Rng & Units 07/23/2022    9:17 AM 07/22/2022    2:37 AM 07/21/2022    3:38 AM  CMP  Glucose 70 - 99 mg/dL 100  112  111   BUN 8 - 23 mg/dL '15  17  19   '$ Creatinine 0.61 - 1.24 mg/dL 1.02  1.09  0.91   Sodium 135 - 145 mmol/L 134  135  134   Potassium 3.5 - 5.1 mmol/L 3.6  4.3  3.6   Chloride 98 - 111 mmol/L 99  100  104   CO2 22 - 32 mmol/L '26  24  21   '$ Calcium 8.9 - 10.3 mg/dL 9.1  9.4  8.7   Total Protein 6.5 - 8.1 g/dL 6.6  6.7  6.1   Total Bilirubin 0.3 - 1.2 mg/dL 0.5  0.9  0.5   Alkaline Phos 38 - 126 U/L 69  70  68   AST 15 - 41 U/L '20  20  19   '$ ALT 0 - 44 U/L '11  11  9     '$ Estimated Creatinine Clearance: 59.5 mL/min (by C-G formula based on SCr of 1.02 mg/dL).  Stephen Aline. Stanford Breed, MD [facility] Phone Number: [phone] 07/24/2022 9:25 AM

## 2022-07-24 NOTE — Plan of Care (Signed)
  Problem: Nutritional: Goal: Maintenance of adequate nutrition will improve Outcome: Progressing   Problem: Education: Goal: Knowledge of General Education information will improve Description: Including pain rating scale, medication(s)/side effects and non-pharmacologic comfort measures Outcome: Progressing   Problem: Nutrition: Goal: Adequate nutrition will be maintained Outcome: Progressing   Problem: Elimination: Goal: Will not experience complications related to bowel motility Outcome: Progressing   Problem: Safety: Goal: Ability to remain free from injury will improve Outcome: Progressing   Problem: Education: Goal: Knowledge of disease or condition will improve Outcome: Progressing Goal: Knowledge of secondary prevention will improve (SELECT ALL) Outcome: Progressing

## 2022-07-25 ENCOUNTER — Encounter (HOSPITAL_COMMUNITY)
Admission: EM | Disposition: A | Discharge status: Home Health | Payer: Self-pay | Source: Home / Self Care | Attending: Family Medicine

## 2022-07-25 ENCOUNTER — Encounter (HOSPITAL_COMMUNITY): Payer: Self-pay | Admitting: Family Medicine

## 2022-07-25 ENCOUNTER — Inpatient Hospital Stay (HOSPITAL_COMMUNITY): Payer: Medicare Other | Admitting: Certified Registered Nurse Anesthetist

## 2022-07-25 ENCOUNTER — Inpatient Hospital Stay (HOSPITAL_COMMUNITY): Admission: RE | Admit: 2022-07-25 | Payer: Medicare Other | Source: Home / Self Care | Admitting: Vascular Surgery

## 2022-07-25 ENCOUNTER — Other Ambulatory Visit: Payer: Self-pay

## 2022-07-25 ENCOUNTER — Inpatient Hospital Stay (HOSPITAL_COMMUNITY): Payer: Medicare Other

## 2022-07-25 DIAGNOSIS — I6522 Occlusion and stenosis of left carotid artery: Secondary | ICD-10-CM

## 2022-07-25 DIAGNOSIS — Z7984 Long term (current) use of oral hypoglycemic drugs: Secondary | ICD-10-CM

## 2022-07-25 DIAGNOSIS — G9340 Encephalopathy, unspecified: Secondary | ICD-10-CM | POA: Diagnosis not present

## 2022-07-25 DIAGNOSIS — Z951 Presence of aortocoronary bypass graft: Secondary | ICD-10-CM

## 2022-07-25 DIAGNOSIS — I129 Hypertensive chronic kidney disease with stage 1 through stage 4 chronic kidney disease, or unspecified chronic kidney disease: Secondary | ICD-10-CM

## 2022-07-25 DIAGNOSIS — Z87891 Personal history of nicotine dependence: Secondary | ICD-10-CM

## 2022-07-25 DIAGNOSIS — N189 Chronic kidney disease, unspecified: Secondary | ICD-10-CM

## 2022-07-25 DIAGNOSIS — E1122 Type 2 diabetes mellitus with diabetic chronic kidney disease: Secondary | ICD-10-CM

## 2022-07-25 DIAGNOSIS — I2510 Atherosclerotic heart disease of native coronary artery without angina pectoris: Secondary | ICD-10-CM

## 2022-07-25 HISTORY — PX: TRANSCAROTID ARTERY REVASCULARIZATIONÂ: SHX6778

## 2022-07-25 LAB — CBC
HCT: 26.8 % — ABNORMAL LOW (ref 39.0–52.0)
HCT: 31.4 % — ABNORMAL LOW (ref 39.0–52.0)
Hemoglobin: 9.5 g/dL — ABNORMAL LOW (ref 13.0–17.0)
Hemoglobin: 11 g/dL — ABNORMAL LOW (ref 13.0–17.0)
MCH: 30.4 pg (ref 26.0–34.0)
MCH: 30.1 pg (ref 26.0–34.0)
MCHC: 35.4 g/dL (ref 30.0–36.0)
MCHC: 35 g/dL (ref 30.0–36.0)
MCV: 85.6 fL (ref 80.0–100.0)
MCV: 85.8 fL (ref 80.0–100.0)
Platelets: 159 10*3/uL (ref 150–400)
Platelets: 205 10*3/uL (ref 150–400)
RBC: 3.13 MIL/uL — ABNORMAL LOW (ref 4.22–5.81)
RBC: 3.66 MIL/uL — ABNORMAL LOW (ref 4.22–5.81)
RDW: 13 % (ref 11.5–15.5)
RDW: 13.1 % (ref 11.5–15.5)
WBC: 9.4 10*3/uL (ref 4.0–10.5)
WBC: 12.4 10*3/uL — ABNORMAL HIGH (ref 4.0–10.5)
nRBC: 0 % (ref 0.0–0.2)
nRBC: 0 % (ref 0.0–0.2)

## 2022-07-25 LAB — BASIC METABOLIC PANEL
Anion gap: 8 (ref 5–15)
BUN: 16 mg/dL (ref 8–23)
CO2: 17 mmol/L — ABNORMAL LOW (ref 22–32)
Calcium: 7.5 mg/dL — ABNORMAL LOW (ref 8.9–10.3)
Chloride: 110 mmol/L (ref 98–111)
Creatinine, Ser: 0.8 mg/dL (ref 0.61–1.24)
GFR, Estimated: >60 mL/min (ref >60)
Glucose, Bld: 138 mg/dL — ABNORMAL HIGH (ref 70–99)
Potassium: 3.9 mmol/L (ref 3.5–5.1)
Sodium: 135 mmol/L (ref 135–145)

## 2022-07-25 LAB — TYPE AND SCREEN
ABO/RH(D): B POS
Antibody Screen: NEGATIVE

## 2022-07-25 LAB — POCT ACTIVATED CLOTTING TIME: Activated Clotting Time: 323 seconds

## 2022-07-25 LAB — GLUCOSE, CAPILLARY
Glucose-Capillary: 132 mg/dL — ABNORMAL HIGH (ref 70–99)
Glucose-Capillary: 130 mg/dL — ABNORMAL HIGH (ref 70–99)
Glucose-Capillary: 126 mg/dL — ABNORMAL HIGH (ref 70–99)
Glucose-Capillary: 149 mg/dL — ABNORMAL HIGH (ref 70–99)
Glucose-Capillary: 229 mg/dL — ABNORMAL HIGH (ref 70–99)

## 2022-07-25 LAB — MRSA NEXT GEN BY PCR, NASAL: MRSA by PCR Next Gen: NOT DETECTED

## 2022-07-25 SURGERY — TRANSCAROTID ARTERY REVASCULARIZATION (TCAR)
Anesthesia: General | Site: Neck | Laterality: Left

## 2022-07-25 MED ORDER — HYDROMORPHONE HCL 1 MG/ML IJ SOLN: 0.5000 mg | INTRAMUSCULAR | Status: DC | PRN

## 2022-07-25 MED ORDER — CHLORHEXIDINE GLUCONATE 0.12 % MT SOLN
OROMUCOSAL | Status: AC
  Administered 2022-07-25: 15 mL via OROMUCOSAL
  Filled 2022-07-25: qty 15

## 2022-07-25 MED ORDER — PHENYLEPHRINE HCL-NACL 20-0.9 MG/250ML-% IV SOLN
INTRAVENOUS | Status: DC | PRN
  Administered 2022-07-25: 25 ug/min via INTRAVENOUS

## 2022-07-25 MED ORDER — PROPOFOL 10 MG/ML IV BOLUS
INTRAVENOUS | Status: DC | PRN
  Administered 2022-07-25 (×2): 30 mg via INTRAVENOUS
  Administered 2022-07-25: 60 mg via INTRAVENOUS

## 2022-07-25 MED ORDER — PHENYLEPHRINE HCL-NACL 20-0.9 MG/250ML-% IV SOLN
INTRAVENOUS | Status: AC
  Filled 2022-07-25: qty 500

## 2022-07-25 MED ORDER — PROPOFOL 10 MG/ML IV BOLUS
INTRAVENOUS | Status: AC
  Filled 2022-07-25: qty 20

## 2022-07-25 MED ORDER — ACETAMINOPHEN 160 MG/5ML PO SOLN: 1000.0000 mg | Freq: Once | ORAL | Status: DC | PRN

## 2022-07-25 MED ORDER — SODIUM CHLORIDE 0.9 % IV SOLN: INTRAVENOUS | Status: DC

## 2022-07-25 MED ORDER — ONDANSETRON HCL 4 MG/2ML IJ SOLN: 4.0000 mg | Freq: Four times a day (QID) | INTRAMUSCULAR | Status: DC | PRN

## 2022-07-25 MED ORDER — ACETAMINOPHEN 500 MG PO TABS: 1000.0000 mg | ORAL_TABLET | Freq: Once | ORAL | Status: DC | PRN

## 2022-07-25 MED ORDER — ROCURONIUM BROMIDE 10 MG/ML (PF) SYRINGE
PREFILLED_SYRINGE | INTRAVENOUS | Status: DC | PRN
  Administered 2022-07-25: 80 mg via INTRAVENOUS

## 2022-07-25 MED ORDER — FENTANYL CITRATE (PF) 250 MCG/5ML IJ SOLN
INTRAMUSCULAR | Status: DC | PRN
  Administered 2022-07-25: 25 ug via INTRAVENOUS
  Administered 2022-07-25 (×2): 50 ug via INTRAVENOUS

## 2022-07-25 MED ORDER — MAGNESIUM SULFATE 2 GM/50ML IV SOLN
2.0000 g | Freq: Every day | INTRAVENOUS | Status: AC | PRN
  Administered 2022-07-27: 2 g via INTRAVENOUS
  Filled 2022-07-25: qty 50

## 2022-07-25 MED ORDER — PHENYLEPHRINE 80 MCG/ML (10ML) SYRINGE FOR IV PUSH (FOR BLOOD PRESSURE SUPPORT)
PREFILLED_SYRINGE | INTRAVENOUS | Status: AC
  Filled 2022-07-25: qty 20

## 2022-07-25 MED ORDER — HEMOSTATIC AGENTS (NO CHARGE) OPTIME
TOPICAL | Status: DC | PRN
  Administered 2022-07-25: 1 via TOPICAL

## 2022-07-25 MED ORDER — POTASSIUM CHLORIDE CRYS ER 20 MEQ PO TBCR: 20.0000 meq | EXTENDED_RELEASE_TABLET | Freq: Every day | ORAL | Status: DC | PRN

## 2022-07-25 MED ORDER — BISACODYL 5 MG PO TBEC
5.0000 mg | DELAYED_RELEASE_TABLET | Freq: Every day | ORAL | Status: DC | PRN
  Administered 2022-07-26 – 2022-07-27 (×2): 5 mg via ORAL
  Filled 2022-07-25 (×2): qty 1

## 2022-07-25 MED ORDER — SUCCINYLCHOLINE CHLORIDE 200 MG/10ML IV SOSY
PREFILLED_SYRINGE | INTRAVENOUS | Status: AC
  Filled 2022-07-25: qty 10

## 2022-07-25 MED ORDER — EPHEDRINE 5 MG/ML INJ
INTRAVENOUS | Status: AC
  Filled 2022-07-25: qty 5

## 2022-07-25 MED ORDER — SUGAMMADEX SODIUM 200 MG/2ML IV SOLN
INTRAVENOUS | Status: DC | PRN
  Administered 2022-07-25: 200 mg via INTRAVENOUS

## 2022-07-25 MED ORDER — HEPARIN SODIUM (PORCINE) 1000 UNIT/ML IJ SOLN
INTRAMUSCULAR | Status: DC | PRN
  Administered 2022-07-25: 8000 [IU] via INTRAVENOUS

## 2022-07-25 MED ORDER — 0.9 % SODIUM CHLORIDE (POUR BTL) OPTIME
TOPICAL | Status: DC | PRN
  Administered 2022-07-25: 1000 mL

## 2022-07-25 MED ORDER — ROCURONIUM BROMIDE 10 MG/ML (PF) SYRINGE
PREFILLED_SYRINGE | INTRAVENOUS | Status: AC
  Filled 2022-07-25: qty 20

## 2022-07-25 MED ORDER — ALBUMIN HUMAN 5 % IV SOLN
INTRAVENOUS | Status: AC
  Filled 2022-07-25: qty 250

## 2022-07-25 MED ORDER — LIDOCAINE 2% (20 MG/ML) 5 ML SYRINGE
INTRAMUSCULAR | Status: DC | PRN
  Administered 2022-07-25: 40 mg via INTRAVENOUS

## 2022-07-25 MED ORDER — IODIXANOL 320 MG/ML IV SOLN
INTRAVENOUS | Status: DC | PRN
  Administered 2022-07-25: 30 mL via INTRA_ARTERIAL

## 2022-07-25 MED ORDER — ALUM & MAG HYDROXIDE-SIMETH 200-200-20 MG/5ML PO SUSP: 15.0000 mL | ORAL | Status: DC | PRN

## 2022-07-25 MED ORDER — PHENYLEPHRINE 80 MCG/ML (10ML) SYRINGE FOR IV PUSH (FOR BLOOD PRESSURE SUPPORT)
PREFILLED_SYRINGE | INTRAVENOUS | Status: DC | PRN
  Administered 2022-07-25: 160 ug via INTRAVENOUS
  Administered 2022-07-25 (×2): 80 ug via INTRAVENOUS
  Administered 2022-07-25: 160 ug via INTRAVENOUS
  Administered 2022-07-25 (×2): 80 ug via INTRAVENOUS
  Administered 2022-07-25: 160 ug via INTRAVENOUS

## 2022-07-25 MED ORDER — ONDANSETRON HCL 4 MG/2ML IJ SOLN
INTRAMUSCULAR | Status: AC
  Filled 2022-07-25: qty 6

## 2022-07-25 MED ORDER — LABETALOL HCL 5 MG/ML IV SOLN: 10.0000 mg | INTRAVENOUS | Status: DC | PRN

## 2022-07-25 MED ORDER — FENTANYL CITRATE (PF) 100 MCG/2ML IJ SOLN: 25.0000 ug | INTRAMUSCULAR | Status: DC | PRN

## 2022-07-25 MED ORDER — OXYCODONE HCL 5 MG PO TABS: 5.0000 mg | ORAL_TABLET | ORAL | Status: DC | PRN

## 2022-07-25 MED ORDER — ONDANSETRON HCL 4 MG/2ML IJ SOLN
INTRAMUSCULAR | Status: DC | PRN
  Administered 2022-07-25: 4 mg via INTRAVENOUS

## 2022-07-25 MED ORDER — ASPIRIN 325 MG PO TBEC
DELAYED_RELEASE_TABLET | ORAL | Status: AC
  Filled 2022-07-25: qty 1

## 2022-07-25 MED ORDER — PHENOL 1.4 % MT LIQD: 1.0000 | OROMUCOSAL | Status: DC | PRN

## 2022-07-25 MED ORDER — METOPROLOL TARTRATE 5 MG/5ML IV SOLN: 2.0000 mg | INTRAVENOUS | Status: DC | PRN

## 2022-07-25 MED ORDER — DEXAMETHASONE SODIUM PHOSPHATE 10 MG/ML IJ SOLN
INTRAMUSCULAR | Status: AC
  Filled 2022-07-25: qty 2

## 2022-07-25 MED ORDER — HEPARIN 6000 UNIT IRRIGATION SOLUTION
Status: DC | PRN
  Administered 2022-07-25: 1

## 2022-07-25 MED ORDER — CHLORHEXIDINE GLUCONATE 0.12 % MT SOLN: 15.0000 mL | Freq: Once | OROMUCOSAL | Status: AC

## 2022-07-25 MED ORDER — INSULIN ASPART 100 UNIT/ML IJ SOLN: 0.0000 [IU] | INTRAMUSCULAR | Status: DC | PRN

## 2022-07-25 MED ORDER — ALBUMIN HUMAN 5 % IV SOLN: INTRAVENOUS | Status: DC | PRN

## 2022-07-25 MED ORDER — ORAL CARE MOUTH RINSE: 15.0000 mL | Freq: Once | OROMUCOSAL | Status: AC

## 2022-07-25 MED ORDER — LACTATED RINGERS IV SOLN: INTRAVENOUS | Status: DC

## 2022-07-25 MED ORDER — GLYCOPYRROLATE PF 0.2 MG/ML IJ SOSY
PREFILLED_SYRINGE | INTRAMUSCULAR | Status: DC | PRN
  Administered 2022-07-25: .2 mg via INTRAVENOUS

## 2022-07-25 MED ORDER — PROTAMINE SULFATE 10 MG/ML IV SOLN
INTRAVENOUS | Status: DC | PRN
  Administered 2022-07-25: 50 mg via INTRAVENOUS

## 2022-07-25 MED ORDER — DEXAMETHASONE SODIUM PHOSPHATE 10 MG/ML IJ SOLN
INTRAMUSCULAR | Status: DC | PRN
  Administered 2022-07-25: 5 mg via INTRAVENOUS

## 2022-07-25 MED ORDER — LACTATED RINGERS IV SOLN: INTRAVENOUS | Status: DC | PRN

## 2022-07-25 MED ORDER — HYDRALAZINE HCL 20 MG/ML IJ SOLN: 5.0000 mg | INTRAMUSCULAR | Status: DC | PRN

## 2022-07-25 MED ORDER — GUAIFENESIN-DM 100-10 MG/5ML PO SYRP: 15.0000 mL | ORAL_SOLUTION | ORAL | Status: DC | PRN

## 2022-07-25 MED ORDER — ALBUMIN HUMAN 5 % IV SOLN
12.5000 g | Freq: Once | INTRAVENOUS | Status: AC
  Administered 2022-07-25: 12.5 g via INTRAVENOUS

## 2022-07-25 MED ORDER — PROPOFOL 500 MG/50ML IV EMUL
INTRAVENOUS | Status: DC | PRN
  Administered 2022-07-25: 75 ug/kg/min via INTRAVENOUS

## 2022-07-25 MED ORDER — FENTANYL CITRATE (PF) 250 MCG/5ML IJ SOLN
INTRAMUSCULAR | Status: AC
  Filled 2022-07-25: qty 5

## 2022-07-25 MED ORDER — SENNOSIDES-DOCUSATE SODIUM 8.6-50 MG PO TABS: 1.0000 | ORAL_TABLET | Freq: Every evening | ORAL | Status: DC | PRN

## 2022-07-25 MED ORDER — HEPARIN 6000 UNIT IRRIGATION SOLUTION
Status: AC
  Filled 2022-07-25: qty 500

## 2022-07-25 MED ORDER — SODIUM CHLORIDE 0.9 % IV SOLN
500.0000 mL | Freq: Once | INTRAVENOUS | Status: AC | PRN
  Administered 2022-07-26: 500 mL via INTRAVENOUS

## 2022-07-25 MED ORDER — ACETAMINOPHEN 10 MG/ML IV SOLN: 1000.0000 mg | Freq: Once | INTRAVENOUS | Status: DC | PRN

## 2022-07-25 MED ORDER — CEFAZOLIN SODIUM-DEXTROSE 2-4 GM/100ML-% IV SOLN
2.0000 g | Freq: Three times a day (TID) | INTRAVENOUS | Status: AC
  Administered 2022-07-25 (×2): 2 g via INTRAVENOUS
  Filled 2022-07-25 (×2): qty 100

## 2022-07-25 MED ORDER — DOCUSATE SODIUM 100 MG PO CAPS
100.0000 mg | ORAL_CAPSULE | Freq: Every day | ORAL | Status: DC
  Administered 2022-07-26 – 2022-07-27 (×2): 100 mg via ORAL
  Filled 2022-07-25 (×2): qty 1

## 2022-07-25 SURGICAL SUPPLY — 61 items
ADH SKN CLS APL DERMABOND .7 (GAUZE/BANDAGES/DRESSINGS) ×1
ADH SKN CLS LQ APL DERMABOND (GAUZE/BANDAGES/DRESSINGS) ×2
BAG BANDED W/RUBBER/TAPE 36X54 (MISCELLANEOUS) ×2 IMPLANT
BAG COUNTER SPONGE SURGICOUNT (BAG) ×2 IMPLANT
BAG EQP BAND 135X91 W/RBR TAPE (MISCELLANEOUS) ×1
BAG SPNG CNTER NS LX DISP (BAG) ×1
BALLN STERLING RX 5X30X80 (BALLOONS) ×1
BALLN STERLING RX 6X20X80 (BALLOONS) ×1
BALLOON STERLING RX 5X30X80 (BALLOONS) IMPLANT
BALLOON STERLING RX 6X20X80 (BALLOONS) IMPLANT
CANISTER SUCT 3000ML PPV (MISCELLANEOUS) ×2 IMPLANT
CATH BEACON 5 .035 40 KMP TP (CATHETERS) IMPLANT
CATH BEACON 5 .038 40 KMP TP (CATHETERS) ×1
CATH SHOCKWAVE S4 4.0X40 (CATHETERS) IMPLANT
CLIP VESOCCLUDE MED 6/CT (CLIP) ×2 IMPLANT
CLIP VESOCCLUDE SM WIDE 6/CT (CLIP) ×2 IMPLANT
CNTNR URN SCR LID CUP LEK RST (MISCELLANEOUS) IMPLANT
CONT SPEC 4OZ STRL OR WHT (MISCELLANEOUS) ×1
COVER DOME SNAP 22 D (MISCELLANEOUS) ×2 IMPLANT
COVER PROBE W GEL 5X96 (DRAPES) ×2 IMPLANT
DERMABOND ADVANCED .7 DNX12 (GAUZE/BANDAGES/DRESSINGS) ×2 IMPLANT
DERMABOND ADVANCED .7 DNX6 (GAUZE/BANDAGES/DRESSINGS) IMPLANT
DRAPE FEMORAL ANGIO 80X135IN (DRAPES) ×2 IMPLANT
ELECT REM PT RETURN 9FT ADLT (ELECTROSURGICAL) ×1
ELECTRODE REM PT RTRN 9FT ADLT (ELECTROSURGICAL) ×2 IMPLANT
GAUZE SPONGE 4X4 12PLY STRL (GAUZE/BANDAGES/DRESSINGS) IMPLANT
GLOVE BIO SURGEON STRL SZ7.5 (GLOVE) ×2 IMPLANT
GOWN STRL REUS W/ TWL LRG LVL3 (GOWN DISPOSABLE) ×4 IMPLANT
GOWN STRL REUS W/ TWL XL LVL3 (GOWN DISPOSABLE) ×2 IMPLANT
GOWN STRL REUS W/TWL LRG LVL3 (GOWN DISPOSABLE) ×2
GOWN STRL REUS W/TWL XL LVL3 (GOWN DISPOSABLE) ×1
GUIDEWIRE ENROUTE 0.014 (WIRE) ×2 IMPLANT
HEMOSTAT SNOW SURGICEL 2X4 (HEMOSTASIS) IMPLANT
INTRODUCER KIT GALT 7CM (INTRODUCER) ×1
KIT BASIN OR (CUSTOM PROCEDURE TRAY) ×2 IMPLANT
KIT ENCORE 26 ADVANTAGE (KITS) ×2 IMPLANT
KIT INTRODUCER GALT 7 (INTRODUCER) ×2 IMPLANT
KIT TURNOVER KIT B (KITS) ×2 IMPLANT
LOOP VESSEL MINI RED (MISCELLANEOUS) IMPLANT
NDL HYPO 25GX1X1/2 BEV (NEEDLE) IMPLANT
NEEDLE HYPO 25GX1X1/2 BEV (NEEDLE) IMPLANT
PACK CAROTID (CUSTOM PROCEDURE TRAY) ×2 IMPLANT
POSITIONER HEAD DONUT 9IN (MISCELLANEOUS) ×2 IMPLANT
PROTECTION STATION PRESSURIZED (MISCELLANEOUS) ×1
SET MICROPUNCTURE 5F STIFF (MISCELLANEOUS) ×2 IMPLANT
STATION PROTECTION PRESSURIZED (MISCELLANEOUS) ×2 IMPLANT
STENT TRANSCAROTID SYSTEM 8X40 (Permanent Stent) IMPLANT
SUT MNCRL AB 4-0 PS2 18 (SUTURE) ×2 IMPLANT
SUT PROLENE 5 0 C 1 24 (SUTURE) ×2 IMPLANT
SUT SILK 2 0 PERMA HAND 18 BK (SUTURE) ×2 IMPLANT
SUT VIC AB 3-0 SH 27 (SUTURE) ×1
SUT VIC AB 3-0 SH 27X BRD (SUTURE) ×2 IMPLANT
SYR 10ML LL (SYRINGE) ×6 IMPLANT
SYR 20ML LL LF (SYRINGE) ×2 IMPLANT
SYR CONTROL 10ML LL (SYRINGE) IMPLANT
SYSTEM TRANSCAROTID NEUROPRTCT (MISCELLANEOUS) ×2 IMPLANT
TOWEL GREEN STERILE (TOWEL DISPOSABLE) ×2 IMPLANT
TRANSCAROTID NEUROPROTECT SYS (MISCELLANEOUS) ×1
WATER STERILE IRR 1000ML POUR (IV SOLUTION) ×2 IMPLANT
WIRE BENTSON .035X145CM (WIRE) ×2 IMPLANT
WIRE SPARTACORE .014X190CM (WIRE) IMPLANT

## 2022-07-25 NOTE — Progress Notes (Signed)
[facility] [place]  Subjective  - no complaints   Objective 136/67 71 98 F (36.7 C) (Oral) 18 96%  Intake/Output Summary (Last 24 hours) at 07/25/2022 1039 Last data filed at 07/25/2022 0300 Gross per 24 hour  Intake 50 ml  Output 1100 ml  Net -1050 ml    RUE/RLE 5/5 LUE/LLE 4/5  Laboratory Lab Results: Recent Labs    07/23/22 0917 07/24/22 0718  WBC 8.7 10.2  HGB 13.6 13.2  HCT 39.3 38.3*  PLT 219 206   BMET Recent Labs    07/23/22 0917 07/24/22 0718  NA 134* 135  K 3.6 3.9  CL 99 101  CO2 26 23  GLUCOSE 100* 125*  BUN 15 19  CREATININE 1.02 1.07  CALCIUM 9.1 9.1    COAG Lab Results  Component Value Date   INR 1.13 03/20/2015   INR 2.1 (H) 07/16/2009   INR 1.9 (H) 07/15/2009   No results found for: "PTT"  Assessment/Planning:  87 year old male admitted with a left basal ganglia stroke and found to have 75% right ICA stenosis and 90% left ICA stenosis.  He also has intracranial small vessel disease.  Plan left TCAR today after discussion with neurology.  Have quoted the family 1% perioperative stroke risk in addition to other risk and benefits.  Will require shockwave lithotripsy due to heavy calcification.  He is fairly limited at baseline so going for the more minimally invasive approach after long discussion with the family about CEA versus carotid stenting.  Marty Heck 07/25/2022 10:39 AM --

## 2022-07-25 NOTE — Op Note (Signed)
Date: July 25, 2022  Preoperative diagnosis: Symptomatic calcified 90% left ICA stenosis  Postoperative diagnosis: Same  Procedure: 1.  Ultrasound-guided access of the left common femoral vein with delivery of the venous TCAR sheath 2.  Shockwave lithotripsy of the left internal carotid artery (4 mm x 40 mm shockwave balloon with 80 pulses) 3.  Transcatheter placement of endovascular stent in the left cervical carotid artery after cutdown including angioplasty with distal embolic protection (left TCAR)  Surgeon: Dr. Marty Heck, MD  Assistant: Roxy Horseman, [place]  Indications: 87 year old male admitted with left basal ganglia stroke found to have a 90% calcified left ICA stenosis.  He was evaluated by neurology who recommended left carotid intervention.  He presents today after we discussed carotid endarterectomy versus TCAR.  Family elected for TCAR.  Anesthesia: General  Findings: Ultrasound-guided access left common femoral vein for delivery of venous TCAR sheath.  Transverse incision above the left clavicle with cutdown on the common carotid artery where a pursestring was placed and then we placed arterial working sheath.  Once we went on active clamp the 90% calcified left internal carotid artery stenosis was crossed and then treated with a 4 mm x 40 mm shockwave balloon lithotripsy due to heavy calcification for 80 pulses.  The carotid was then dilated with a 5 mm x 30 mm angioplasty balloon.  It was stented with an 8 mm x 40 mm Enroute stent.  I then postdilated the stent with a 6 mm x 20 mm balloon for a 50% residual stenosis from the calcium.  Less than 30% residual stenosis at completion after post-dilation.  Details: Patient was taken to the operating room after informed consent was obtained.  Placed on the operative table in the supine position.  General endotracheal anesthesia was induced.  I marked the carotid artery just above the left clavicle with ultrasound.   We placed a bump under shoulder and his neck was turned to the right.  His left neck and bilateral groins were then prepped and draped in standard sterile fashion.  Antibiotics were given.  Timeout performed.  Initially evaluated the right common femoral vein with ultrasound and I could not visualize adequate vein for access.  I then went to the left groin and there was a patent femoral vein that was compressible.  This was accessed under ultrasound guidance and placed a micro access needle and then a microwire and then a microsheath.  I then placed a Bentson wire and then the venous TCAR sheath in the left common femoral vein.  Then turned attention to the left neck where a transverse incision was made 1 fingerbreadth above the clavicle.  Dissected down through the platysma with Bovie cautery and then dissected between the heads of the sternocleidomastoid and visualized the left internal jugular vein that was mobilized lateral in the surgical wound and we dissected out the common carotid artery preserving the vagus nerve.  This was controlled with umbilical tape and a vessel loop.  Patient was given 100 units/kg IV heparin.  We placed a 5-0 pursestring on anterior wall of the common carotid artery with prlene.  This was accessed with micro needle and placed a microwire and then a microsheath.  The left carotid angiogram was obtained.  We marked the carotid bifurcation and then used a J-wire for more support and exchanged for the arterial working sheath in the left common carotid artery.  This was secured and we connected the filter to the sheath in the vein  in the groin.  We were on passive flow reversal.  We then got some additional images with the C arm in left oblique to identify the takeoff of the left internal carotid artery showing a 90% calcified stenosis.  We then went on active clamp after a TCAR timeout.  The lesion was crossed with an 014 [place] core wire.  We elected lithotripsy given heavy  calcification and used a 4 mm x 40 mm shockwave balloon with a total of 80 pulses.  I then angioplastied this with a 5 mm x 30 mm angioplasty balloon.  This was then stented with a 8 mm x 40 mm Enroute stent.  There was about 50% residual stenosis after 2 additional minutes of active flow reversal.  I then treated this with a 6 mm x 20 mm angioplasty balloon after the stent was deployed with less than 30% residual stenosis.  Satisfied with the results wire was removed and we came off active clamp.  Arterial sheath was removed from the common carotid artery and we tied the pursestring down with good hemostasis.  I listen with pencil Doppler and had good flow.  Protamine was given for reversal.  The venous sheath in the groin was removed and manual pressure was held for 10 minutes.  The neck was then irrigated out and I placed Surgicel snow and then closed the platysma with 3-0 Vicryl and skin with 4-0 Monocryl.  Awakened from anesthesia and taken to recovery in stable condition.  He was at his neurologic baseline.  Complication: None  Condition: Stable  Marty Heck, MD [facility] [place] Office: [place]

## 2022-07-25 NOTE — Anesthesia Procedure Notes (Signed)
Arterial Line Insertion Start/End9/25/2023 9:47 AM Performed by: Oleta Mouse, MD, Betha Loa, CRNA, CRNA  Patient location: Pre-op. Preanesthetic checklist: patient identified, IV checked, site marked, risks and benefits discussed, surgical consent, monitors and equipment checked, pre-op evaluation, timeout performed and anesthesia consent Lidocaine 1% used for infiltration Left, radial was placed Catheter size: 20 G Hand hygiene performed  and maximum sterile barriers used   Attempts: 1 Procedure performed without using ultrasound guided technique. Following insertion, dressing applied and Biopatch. Post procedure assessment: normal and unchanged  Patient tolerated the procedure well with no immediate complications.

## 2022-07-25 NOTE — Plan of Care (Signed)
  Problem: Skin Integrity: Goal: Risk for impaired skin integrity will decrease Outcome: Progressing   Problem: Activity: Goal: Ability to tolerate increased activity will improve Outcome: Progressing   Problem: Respiratory: Goal: Ability to maintain adequate ventilation will improve Outcome: Progressing

## 2022-07-25 NOTE — Anesthesia Procedure Notes (Signed)
Procedure Name: Intubation Date/Time: 07/25/2022 11:19 AM  Performed by: Betha Loa, CRNAPre-anesthesia Checklist: Patient identified, Emergency Drugs available, Suction available and Patient being monitored Patient Re-evaluated:Patient Re-evaluated prior to induction Oxygen Delivery Method: Circle system utilized Preoxygenation: Pre-oxygenation with 100% oxygen Induction Type: IV induction Ventilation: Mask ventilation without difficulty Laryngoscope Size: Mac and 4 Grade View: Grade I Tube type: Oral Tube size: 7.5 mm Number of attempts: 1 Airway Equipment and Method: Stylet Placement Confirmation: ETT inserted through vocal cords under direct vision, positive ETCO2 and breath sounds checked- equal and bilateral Secured at: 23 cm Tube secured with: Tape Dental Injury: Teeth and Oropharynx as per pre-operative assessment

## 2022-07-25 NOTE — Transfer of Care (Signed)
Immediate Anesthesia Transfer of Care Note  Patient: [name]  Procedure(s) Performed: Transcarotid Artery Revascularization W/ SHOCK WAVE (Left: Neck)  Patient Location: PACU  Anesthesia Type:General  Level of Consciousness: drowsy and responds to stimulation  Airway & Oxygen Therapy: Patient Spontanous Breathing and Patient connected to nasal cannula oxygen  Post-op Assessment: Report given to RN, Post -op Vital signs reviewed and stable and Patient moving all extremities X 4  Post vital signs: Reviewed and stable  Last Vitals:  Vitals Value Taken Time  BP 106/54   Temp    Pulse 71   Resp 16   SpO2 99     Last Pain:  Vitals:   07/25/22 0823  TempSrc: Oral  PainSc:          Complications: No notable events documented.   Neuro assessment done with Dr. Ermalene Postin and Dr. Carlis Abbott prior to extubation.

## 2022-07-25 NOTE — Progress Notes (Signed)
PT Cancellation Note  Patient Details Name: ASHLAND WISEMAN MRN: [id number] DOB: December 15, 1934   Cancelled Treatment:    Reason Eval/Treat Not Completed: Patient at procedure or test/unavailable. Pt off the floor at the OR undergoing L TCAR procedure. Acute PT to return as able and appropriate to progress mobility.  Kittie Plater, PT, DPT Acute Rehabilitation Services Secure chat preferred Office #: [phone]    Berline Lopes 07/25/2022, 11:04 AM

## 2022-07-25 NOTE — Progress Notes (Signed)
[facility] [place]  Subjective  - Awake and moving extremities at baseline.  Speaks a few words.   Objective 107/60 80 97.7 F (36.5 C) (Axillary) 14 98%  Intake/Output Summary (Last 24 hours) at 07/25/2022 1647 Last data filed at 07/25/2022 1234 Gross per 24 hour  Intake 1300 ml  Output 1130 ml  Net 170 ml    No tongue deviation No acute distress Left neck incision healing without hematoma, left groin soft   Assessment/Planning: POD # 0 left TCAR for symptomatic carotid stenosis  Stable post op disposition pending SNF for rehab.   [name] 07/25/2022 4:47 PM --  Laboratory Lab Results: Recent Labs    07/23/22 0917 07/24/22 0718  WBC 8.7 10.2  HGB 13.6 13.2  HCT 39.3 38.3*  PLT 219 206   BMET Recent Labs    07/23/22 0917 07/24/22 0718  NA 134* 135  K 3.6 3.9  CL 99 101  CO2 26 23  GLUCOSE 100* 125*  BUN 15 19  CREATININE 1.02 1.07  CALCIUM 9.1 9.1    COAG Lab Results  Component Value Date   INR 1.13 03/20/2015   INR 2.1 (H) 07/16/2009   INR 1.9 (H) 07/15/2009   No results found for: "PTT"

## 2022-07-25 NOTE — Progress Notes (Signed)
PROGRESS NOTE    [name]  ZHG:[id number] DOB: 11-10-1934 DOA: 07/19/2022 PCP: Ginger Organ., MD  Chief Complaint  Patient presents with   Altered Mental Status    Brief Narrative:  [name] is [name] 87 y.o. male with medical history significant of CAD, T2DM, GERD, HLD, HTN, PAD, hx of CVA who presented to ED with change in mental status two days ago.  For 2 days he has been lethargic and confused. His caregiver also thought he had [name] left facial droop and so did the hairdresser. She thinks this is better. He has chronic left sided weakness and this seems unchanged.   He has stopped talking almost completely. He typically will converse with his wife some, but this is abnormal for him. He typically walks with Braylan Faul walker, but 2 days ago was in wheel chair.    At baseline he does talk, but very little. He walks around the house with walker. He needs full assist x 2 for showering. He needs help with dressing. He can feed himself. No falls in the last year.    He has not had any fever/chills. No open sores or bed sores. He has been coughing for 2 days that is dry,but at times is clear phlegm. No odor to urine, unsure of color change. He has been eating and drinking well. No N/V/D.      He does not smoke or drink alcohol.    ER Course:  vitals: afebrile, bp: 135/61, HR; 69, RR: 20, oxygen: 98%RA Pertinent labs: BUN: 33, creatinine: 1.29,  CXR: mild bibasilar subsegmental atelectasis or infiltrates CT head: no acute finding  In ED: given 1L IVF and rocephin/azithromycin. BC obtained. TRH asked to admit.   Assessment & Plan:   Principal Problem:   Acute encephalopathy Active Problems:   Pulmonary infiltrates   Type 2 diabetes mellitus with stage 3a chronic kidney disease, without long-term current use of insulin (HCC)   Coronary artery disease involving native heart without angina pectoris   Essential hypertension   Chronic kidney disease, stage 3a (HCC)   Dementia without  behavioral disturbance ([place])   GERD without esophagitis   Stroke ([place])   Assessment and Plan: Anemia Unclear significance, repeat pending  Left Basal Ganglia Infarct ? Related to encephalopathy below MRI with 2.3 cm acute ischemic L basal ganglia infarct CTA head/neck with near occlusion of proximal M1 segment of L MCA with reconstitution of distal M1 and M2 segments, severe stenosis at origin of bilateral internal carotid arteries Echo 60-65, no RWMA, moderate to severe mitral regurg  A1c 6.5, lipid panel pending, TSH wnl PT/OT/SLP Continue tele monitoring - no clear evidence of afib, cards recommending low dose metoprolol daily as tolerated by BP, consider outpatient monitoring for afib after discharge Vascular evaluating per neuro given severe (90%) stenosis on the L -> Plan for L TCAR monday Already on plavix, aspirin started.  Has not tolerated statins in the past.  Zetia. Appreciate neurology assistance  Dysphagia SLP recommending dysphagia 3, thin liquids Will follow SLP recs They note hx of vomiting after eating for about 1 year, esophagram (will plan for after vascular procedure)  Acute encephalopathy  History of Dementia Related to stroke vs pneumonia MRI as above UA not c/w UTI TSH wnl B12 low normal, MMA wnl PT/OT for weakness Improved today Delirium precautions   Pulmonary infiltrates Being treated for possible pneumonia Patient is afebrile with no leukocytosis, normal lactic acid, has had Trevious Rampey cough  CAP  coverage, s/p 5 days abx Urine strep negative, urine legionella negative Negative RVP negative covid/flu Blood cx pending   Type 2 diabetes mellitus with stage 3a chronic kidney disease, without long-term current use of insulin (HCC) A1c 6.5 Hold metformin while inpatient SSI and accuchecks QAC/HS    Coronary artery disease involving native heart without angina pectoris Hx of CABG Can not tolerate statins Continue toprol-xl and plavix    Essential  hypertension Soft blood pressures Metoprolol as above Ramipril on hold   Chronic kidney disease, stage 3a (HCC) At baseline, continue to monitor    Dementia without behavioral disturbance ([place]) delerium precautions    GERD without esophagitis Continue protonix    Moderate to Severe MR Follow outpatient with cards     DVT prophylaxis: lovenox Code Status: full Family Communication: wife, caregiver at bedside  Disposition:   Status is: Observation The patient remains OBS appropriate and will d/c before 2 midnights.   Consultants:  neurology  Procedures:  none  Antimicrobials:  Anti-infectives (From admission, onward)    Start     Dose/Rate Route Frequency Ordered Stop   07/20/22 1745  cefTRIAXone (ROCEPHIN) 2 g in sodium chloride 0.9 % 100 mL IVPB        2 g 200 mL/hr over 30 Minutes Intravenous Every 24 hours 07/19/22 1744 07/25/22 1744   07/20/22 1600  azithromycin (ZITHROMAX) 500 mg in sodium chloride 0.9 % 250 mL IVPB        500 mg 250 mL/hr over 60 Minutes Intravenous Every 24 hours 07/19/22 1745 07/25/22 1559   07/19/22 1615  cefTRIAXone (ROCEPHIN) 2 g in sodium chloride 0.9 % 100 mL IVPB        2 g 200 mL/hr over 30 Minutes Intravenous  Once 07/19/22 1607 07/19/22 1725   07/19/22 1615  azithromycin (ZITHROMAX) 500 mg in sodium chloride 0.9 % 250 mL IVPB        500 mg 250 mL/hr over 60 Minutes Intravenous  Once 07/19/22 1607 07/19/22 1742       Subjective: No complaints Wife and caregiver at bedside  Objective: Vitals:   07/23/22 0345 07/23/22 0815 07/23/22 1204 07/23/22 1450  BP: (!) 143/80 (!) 161/82 (!) 145/88 127/69  Pulse: 90 84 81 71  Resp: '18 18  20  '$ Temp: 98 F (36.7 C) 98.2 F (36.8 C)  98.4 F (36.9 C)  TempSrc: Oral     SpO2: 100%  99% 96%  Weight:      Height:        Intake/Output Summary (Last 24 hours) at 07/23/2022 1757 Last data filed at 07/23/2022 0428 Gross per 24 hour  Intake 217.04 ml  Output 850 ml  Net -632.96 ml    Filed Weights   07/19/22 1753  Weight: 79.4 kg    Examination:  General: No acute distress. Lungs: unlabored Neurological: sleepy post op, but awakens appropriately, says [name] few words to me, moving all extremities Extremities: No clubbing or cyanosis. No edema.   Data Reviewed: I have personally reviewed following labs and imaging studies  CBC: Recent Labs  Lab 07/19/22 1504 07/19/22 1537 07/20/22 0335 07/21/22 0338 07/22/22 0237 07/23/22 0917  WBC 8.4  --  8.0 7.9 8.6 8.7  NEUTROABS 5.3  --   --  4.3 5.0 6.0  HGB 13.2 13.3 12.5* 12.1* 13.0 13.6  HCT 39.7 39.0 36.4* 34.2* 36.6* 39.3  MCV 90.4  --  88.6 85.7 85.7 86.4  PLT 208  --  171 176 195 219  Basic Metabolic Panel: Recent Labs  Lab 07/19/22 1504 07/19/22 1537 07/20/22 0335 07/21/22 0338 07/22/22 0237 07/23/22 0917  NA 134* 133* 138 134* 135 134*  K 4.9 5.9* 4.2 3.6 4.3 3.6  CL 101  --  105 104 100 99  CO2 24  --  24 21* 24 26  GLUCOSE 186*  --  105* 111* 112* 100*  BUN 33*  --  25* '19 17 15  '$ CREATININE 1.29*  --  1.10 0.91 1.09 1.02  CALCIUM 9.3  --  9.2 8.7* 9.4 9.1  MG  --   --   --  1.4* 1.8 2.0  PHOS  --   --   --  2.8 3.5 3.3    GFR: Estimated Creatinine Clearance: 59.5 mL/min (by C-G formula based on SCr of 1.02 mg/dL).  Liver Function Tests: Recent Labs  Lab 07/19/22 1504 07/21/22 0338 07/22/22 0237 07/23/22 0917  AST '25 19 20 20  '$ ALT '10 9 11 11  '$ ALKPHOS 74 68 70 69  BILITOT 0.8 0.5 0.9 0.5  PROT 6.9 6.1* 6.7 6.6  ALBUMIN 3.6 3.2* 3.5 3.4*    CBG: Recent Labs  Lab 07/22/22 1631 07/22/22 2155 07/23/22 0610 07/23/22 1205 07/23/22 1659  GLUCAP 114* 139* 129* 117* 136*     Recent Results (from the past 240 hour(s))  Blood Culture (Routine X 2)     Status: None (Preliminary result)   Collection Time: 07/19/22  2:50 PM   Specimen: BLOOD  Result Value Ref Range Status   Specimen Description BLOOD SITE NOT SPECIFIED  Final   Special Requests   Final    BOTTLES DRAWN  AEROBIC AND ANAEROBIC Blood Culture adequate volume   Culture   Final    NO GROWTH 4 DAYS Performed at [facility], [place] [address] [place], [place] [address]    Report Status PENDING  Incomplete  Blood Culture (Routine X 2)     Status: None (Preliminary result)   Collection Time: 07/19/22  3:00 PM   Specimen: BLOOD  Result Value Ref Range Status   Specimen Description BLOOD SITE NOT SPECIFIED  Final   Special Requests   Final    BOTTLES DRAWN AEROBIC AND ANAEROBIC Blood Culture results may not be optimal due to an inadequate volume of blood received in culture bottles   Culture   Final    NO GROWTH 4 DAYS Performed at [facility], [place] [address], [place], [place] [address]    Report Status PENDING  Incomplete  Resp Panel by RT-PCR (Flu Galena Logie&B, Covid) Anterior Nasal Swab     Status: None   Collection Time: 07/19/22  3:36 PM   Specimen: Anterior Nasal Swab  Result Value Ref Range Status   SARS Coronavirus 2 by RT PCR NEGATIVE NEGATIVE Final    Comment: (NOTE) SARS-CoV-2 target nucleic acids are NOT DETECTED.  The SARS-CoV-2 RNA is generally detectable in upper respiratory specimens during the acute phase of infection. The lowest concentration of SARS-CoV-2 viral copies this assay can detect is 138 copies/mL. Temisha Murley negative result does not preclude SARS-Cov-2 infection and should not be used as the sole basis for treatment or other patient management decisions. Jen Eppinger negative result may occur with  improper specimen collection/handling, submission of specimen other than nasopharyngeal swab, presence of viral mutation(s) within the areas targeted by this assay, and inadequate number of viral copies(<138 copies/mL). Tamsen Reist negative result must be combined with clinical observations, patient history, and epidemiological information. The expected  result is Negative.  Fact Sheet for Patients:  EntrepreneurPulse.com.au  Fact Sheet for Healthcare Providers:   IncredibleEmployment.be  This test is no t yet approved or cleared by the [place] FDA and  has been authorized for detection and/or diagnosis of SARS-CoV-2 by FDA under an Emergency Use Authorization (EUA). This EUA will remain  in effect (meaning this test can be used) for the duration of the COVID-19 declaration under Section 564(b)(1) of the Act, 21 U.S.C.section 360bbb-3(b)(1), unless the authorization is terminated  or revoked sooner.       Influenza Jaycie Kregel by PCR NEGATIVE NEGATIVE Final   Influenza B by PCR NEGATIVE NEGATIVE Final    Comment: (NOTE) The Xpert Xpress SARS-CoV-2/FLU/RSV plus assay is intended as an aid in the diagnosis of influenza from Nasopharyngeal swab specimens and should not be used as Ramsay Bognar sole basis for treatment. Nasal washings and aspirates are unacceptable for Xpert Xpress SARS-CoV-2/FLU/RSV testing.  Fact Sheet for Patients: EntrepreneurPulse.com.au  Fact Sheet for Healthcare Providers: IncredibleEmployment.be  This test is not yet approved or cleared by the [place] FDA and has been authorized for detection and/or diagnosis of SARS-CoV-2 by FDA under an Emergency Use Authorization (EUA). This EUA will remain in effect (meaning this test can be used) for the duration of the COVID-19 declaration under Section 564(b)(1) of the Act, 21 U.S.C. section 360bbb-3(b)(1), unless the authorization is terminated or revoked.  Performed at [facility], [place] [address], [place], [place] [address]   Respiratory (~20 pathogens) panel by PCR     Status: None   Collection Time: 07/19/22  3:36 PM   Specimen: Nasopharyngeal Swab; Respiratory  Result Value Ref Range Status   Adenovirus NOT DETECTED NOT DETECTED Final   Coronavirus 229E NOT DETECTED NOT DETECTED Final    Comment: (NOTE) The Coronavirus on the Respiratory Panel, DOES NOT test for the novel  Coronavirus (2019 nCoV)    Coronavirus  HKU1 NOT DETECTED NOT DETECTED Final   Coronavirus NL63 NOT DETECTED NOT DETECTED Final   Coronavirus OC43 NOT DETECTED NOT DETECTED Final   Metapneumovirus NOT DETECTED NOT DETECTED Final   Rhinovirus / Enterovirus NOT DETECTED NOT DETECTED Final   Influenza Ragnar Waas NOT DETECTED NOT DETECTED Final   Influenza B NOT DETECTED NOT DETECTED Final   Parainfluenza Virus 1 NOT DETECTED NOT DETECTED Final   Parainfluenza Virus 2 NOT DETECTED NOT DETECTED Final   Parainfluenza Virus 3 NOT DETECTED NOT DETECTED Final   Parainfluenza Virus 4 NOT DETECTED NOT DETECTED Final   Respiratory Syncytial Virus NOT DETECTED NOT DETECTED Final   Bordetella pertussis NOT DETECTED NOT DETECTED Final   Bordetella Parapertussis NOT DETECTED NOT DETECTED Final   Chlamydophila pneumoniae NOT DETECTED NOT DETECTED Final   Mycoplasma pneumoniae NOT DETECTED NOT DETECTED Final    Comment: Performed at [facility], [place]. [address], [place], [place] [address]         Radiology Studies: No results found.      Scheduled Meds:  aspirin  300 mg Rectal Daily   Or   aspirin  325 mg Oral Daily   clopidogrel  75 mg Oral Q supper   vitamin B-12  1,000 mcg Oral Daily   enoxaparin (LOVENOX) injection  40 mg Subcutaneous Q24H   ezetimibe  10 mg Oral Daily   insulin aspart  0-9 Units Subcutaneous TID WC   metoprolol succinate  12.5 mg Oral Q supper   pantoprazole  40 mg Oral QAC supper  Continuous Infusions:  azithromycin (ZITHROMAX) 500 mg in sodium chloride 0.9 % 250 mL IVPB 500 mg (07/23/22 1710)   cefTRIAXone (ROCEPHIN)  IV 2 g (07/22/22 1651)     LOS: 3 days    Time spent: over 30 min    Fayrene Helper, MD [facility]   To contact the attending provider between 7A-7P or the covering provider during after hours 7P-7A, please log into the web site [email or web address] and access using universal [place] password for that web site. If you do not have the password, please call the hospital  operator.  07/23/2022, 5:57 PM

## 2022-07-25 NOTE — Anesthesia Preprocedure Evaluation (Signed)
Anesthesia Evaluation  Patient identified by MRN, date of birth, ID bandGeneral Assessment Comment:awake  Reviewed: Allergy & Precautions, NPO status , Patient's Chart, lab work & pertinent test results  History of Anesthesia Complications Negative for: history of anesthetic complications  Airway Mallampati: III  TM Distance: >3 FB Neck ROM: Full    Dental  (+) Missing   Pulmonary former smoker,    breath sounds clear to auscultation       Cardiovascular hypertension, Pt. on medications + CAD, + CABG and + Peripheral Vascular Disease  + Valvular Problems/Murmurs MR  Rhythm:Regular     Neuro/Psych PSYCHIATRIC DISORDERS Dementia CVA, Residual Symptoms    GI/Hepatic GERD  Medicated,  Endo/Other  diabetes  Renal/GU CRFRenal diseaseLab Results      Component                Value               Date                      CREATININE               1.07                07/24/2022                Musculoskeletal  (+) Arthritis ,   Abdominal   Peds  Hematology  (+) Blood dyscrasia, , Lab Results      Component                Value               Date                      WBC                      10.2                07/24/2022                HGB                      13.2                07/24/2022                HCT                      38.3 (L)            07/24/2022                MCV                      86.8                07/24/2022                PLT                      206                 07/24/2022           plavix    Anesthesia Other Findings   Reproductive/Obstetrics  Anesthesia Physical Anesthesia Plan  ASA: 3  Anesthesia Plan: General   Post-op Pain Management: Minimal or no pain anticipated   Induction: Intravenous  PONV Risk Score and Plan: 2 and Ondansetron and Dexamethasone  Airway Management Planned: Oral ETT  Additional Equipment: Arterial  line  Intra-op Plan:   Post-operative Plan: Extubation in OR  Informed Consent: I have reviewed the patients History and Physical, chart, labs and discussed the procedure including the risks, benefits and alternatives for the proposed anesthesia with the patient or authorized representative who has indicated his/her understanding and acceptance.     Dental advisory given  Plan Discussed with: CRNA  Anesthesia Plan Comments:         Anesthesia Quick Evaluation

## 2022-07-26 ENCOUNTER — Encounter (HOSPITAL_COMMUNITY): Payer: Self-pay | Admitting: Vascular Surgery

## 2022-07-26 DIAGNOSIS — G9340 Encephalopathy, unspecified: Secondary | ICD-10-CM | POA: Diagnosis not present

## 2022-07-26 LAB — LIPID PANEL
Cholesterol: 125 mg/dL (ref 0–200)
HDL: 35 mg/dL — ABNORMAL LOW (ref >40)
LDL Cholesterol: 78 mg/dL (ref 0–99)
Total CHOL/HDL Ratio: 3.6 RATIO
Triglycerides: 61 mg/dL (ref <150)
VLDL: 12 mg/dL (ref 0–40)

## 2022-07-26 LAB — BASIC METABOLIC PANEL
Anion gap: 13 (ref 5–15)
BUN: 27 mg/dL — ABNORMAL HIGH (ref 8–23)
CO2: 18 mmol/L — ABNORMAL LOW (ref 22–32)
Calcium: 8.6 mg/dL — ABNORMAL LOW (ref 8.9–10.3)
Chloride: 102 mmol/L (ref 98–111)
Creatinine, Ser: 1.24 mg/dL (ref 0.61–1.24)
GFR, Estimated: 57 mL/min — ABNORMAL LOW (ref >60)
Glucose, Bld: 165 mg/dL — ABNORMAL HIGH (ref 70–99)
Potassium: 4.1 mmol/L (ref 3.5–5.1)
Sodium: 133 mmol/L — ABNORMAL LOW (ref 135–145)

## 2022-07-26 LAB — CBC
HCT: 30.9 % — ABNORMAL LOW (ref 39.0–52.0)
Hemoglobin: 11 g/dL — ABNORMAL LOW (ref 13.0–17.0)
MCH: 30.5 pg (ref 26.0–34.0)
MCHC: 35.6 g/dL (ref 30.0–36.0)
MCV: 85.6 fL (ref 80.0–100.0)
Platelets: 228 10*3/uL (ref 150–400)
RBC: 3.61 MIL/uL — ABNORMAL LOW (ref 4.22–5.81)
RDW: 13.2 % (ref 11.5–15.5)
WBC: 12 10*3/uL — ABNORMAL HIGH (ref 4.0–10.5)
nRBC: 0 % (ref 0.0–0.2)

## 2022-07-26 LAB — GLUCOSE, CAPILLARY
Glucose-Capillary: 182 mg/dL — ABNORMAL HIGH (ref 70–99)
Glucose-Capillary: 150 mg/dL — ABNORMAL HIGH (ref 70–99)
Glucose-Capillary: 166 mg/dL — ABNORMAL HIGH (ref 70–99)
Glucose-Capillary: 166 mg/dL — ABNORMAL HIGH (ref 70–99)

## 2022-07-26 MED ORDER — DOPAMINE-DEXTROSE 3.2-5 MG/ML-% IV SOLN
2.0000 ug/kg/min | INTRAVENOUS | Status: DC
  Administered 2022-07-26 (×2): 5 ug/kg/min via INTRAVENOUS
  Filled 2022-07-26: qty 250

## 2022-07-26 MED ORDER — PSEUDOEPHEDRINE HCL 30 MG PO TABS
30.0000 mg | ORAL_TABLET | Freq: Four times a day (QID) | ORAL | Status: DC
  Administered 2022-07-26 – 2022-07-29 (×13): 30 mg via ORAL
  Filled 2022-07-26 (×14): qty 1

## 2022-07-26 NOTE — Anesthesia Postprocedure Evaluation (Signed)
Anesthesia Post Note  Patient: [name]  Procedure(s) Performed: Transcarotid Artery Revascularization W/ SHOCK WAVE (Left: Neck)     Patient location during evaluation: PACU Anesthesia Type: General Level of consciousness: patient cooperative Pain management: pain level controlled Vital Signs Assessment: post-procedure vital signs reviewed and stable Respiratory status: spontaneous breathing, nonlabored ventilation, respiratory function stable and patient connected to nasal cannula oxygen Cardiovascular status: blood pressure returned to baseline and stable Postop Assessment: no apparent nausea or vomiting Anesthetic complications: no   No notable events documented.  Last Vitals:  Vitals:   07/26/22 0500 07/26/22 0728  BP: 120/71 (!) 125/55  Pulse: (!) 112 77  Resp: 18 17  Temp:    SpO2: 92% 95%    Last Pain:  Vitals:   07/26/22 0728  TempSrc: Oral  PainSc:                  [name]

## 2022-07-26 NOTE — Progress Notes (Addendum)
  Progress Note    07/26/2022 6:49 AM 1 Day Post-Op  Subjective:  sleeping, wakes easily  Afebrile 87'G-811'X systolic  Gtts:  dopamine started last evening with pseudoephedrine per RN  Vitals:   07/26/22 0400 07/26/22 0500  BP: (!) 98/46 120/71  Pulse: (!) 55 (!) 112  Resp: 18 18  Temp:    SpO2:  92%     Physical Exam: Neuro:  strong right hand grip; wiggling toes on both feet Lungs:  non labored Incision:  left neck incision is clean without hematoma  CBC    Component Value Date/Time   WBC 12.4 (H) 07/25/2022 1817   RBC 3.66 (L) 07/25/2022 1817   HGB 11.0 (L) 07/25/2022 1817   HCT 31.4 (L) 07/25/2022 1817   PLT 205 07/25/2022 1817   MCV 85.8 07/25/2022 1817   MCH 30.1 07/25/2022 1817   MCHC 35.0 07/25/2022 1817   RDW 13.1 07/25/2022 1817   LYMPHSABS 2.5 07/24/2022 0718   MONOABS 1.0 07/24/2022 0718   EOSABS 0.2 07/24/2022 0718   BASOSABS 0.1 07/24/2022 0718    BMET    Component Value Date/Time   NA 135 07/25/2022 1618   K 3.9 07/25/2022 1618   CL 110 07/25/2022 1618   CO2 17 (L) 07/25/2022 1618   GLUCOSE 138 (H) 07/25/2022 1618   BUN 16 07/25/2022 1618   CREATININE 0.80 07/25/2022 1618   CALCIUM 7.5 (L) 07/25/2022 1618   GFRNONAA >60 07/25/2022 1618   GFRAA >60 03/24/2015 0300     Intake/Output Summary (Last 24 hours) at 07/26/2022 0649 Last data filed at 07/26/2022 0515 Gross per 24 hour  Intake 2881.45 ml  Output 30 ml  Net 2851.45 ml     Assessment/Plan:  This is a 87 y.o. male who is s/p  Shockwave lithotripsy of the left internal carotid artery,  left TCAR after cutdown including angioplasty with distal embolic protection  1 Day Post-Op  -pt is doing well this am.  Did require Dopamine and pseudoephedrine yesterday for low BP and this has improved. RN reports systolic of 66 on a-line yesterday but this is not documented.  Wean as tolerated. -pt with strong right hand grip; moving bilateral feet -continue asa/plavix  (statin  allergy) -f/u with Dr. Carlis Abbott in 4 weeks with carotid duplex.   May need right carotid intervention as he has 75% ICA stenosis also.  (Our office will arrange appt and message has been sent)   Leontine Locket, PA-C [facility] [phone]   I have seen and evaluated the patient. I agree with the PA note as documented above.  Postop day 1 status post left TCAR with lithotripsy for high-grade calcified stenosis.  Appears at his neurologic baseline.  Tongue is midline.  Equal grip strength bilaterally.  Wiggling his toes.  Plan is SNF.  Continue aspirin Plavix and has statin allergy.  Started on 5 mcg of dopamine plus oral Sudafed last night for hypotension.  This is improving this morning.  Expect this from carotid stenting with baroreceptors.  Marty Heck, MD [facility] [employer] Office: [phone]

## 2022-07-26 NOTE — Progress Notes (Signed)
PROGRESS NOTE    [name]  CBJ:[id number] DOB: 08/05/1935 DOA: 07/19/2022 PCP: Ginger Organ., MD  Chief Complaint  Patient presents with   Altered Mental Status    Brief Narrative:  [name] is [name] 87 y.o. male with medical history significant of CAD, T2DM, GERD, HLD, HTN, PAD, hx of CVA who presented to ED with change in mental status two days ago.   For 2 days he has been lethargic and confused. His caregiver also thought he had [name] left facial droop and so did the hairdresser. She thinks this is better. He has chronic left sided weakness and this seems unchanged.   He has stopped talking almost completely. He typically will converse with his wife some, but this is abnormal for him. He typically walks with Akeen Ledyard walker, but 2 days ago was in wheel chair.    At baseline he does talk, but very little. He walks around the house with walker. He needs full assist x 2 for showering. He needs help with dressing. He can feed himself. No falls in the last year.    He has not had any fever/chills. No open sores or bed sores. He has been coughing for 2 days that is dry,but at times is clear phlegm. No odor to urine, unsure of color change. He has been eating and drinking well. No N/V/D.    He was admitted for AMS, found to have Nonie Lochner stroke.  Noted to have L carotid stenosis and now is s/p vascular intervention.  Currently on dopamine and sudafed per vascular for hypotension post op.  Assessment & Plan:   Principal Problem:   Acute encephalopathy Active Problems:   Pulmonary infiltrates   Type 2 diabetes mellitus with stage 3a chronic kidney disease, without long-term current use of insulin (HCC)   Coronary artery disease involving native heart without angina pectoris   Essential hypertension   Chronic kidney disease, stage 3a (HCC)   Dementia without behavioral disturbance ([place])   GERD without esophagitis   Stroke ([place])   Assessment and Plan: Post operative hypotension Expected post op  related to stenting with baroreceptors per vascular Currently on dopamine and oral sudafed per vascular surgery Dopamine 2 mcg/kg/min and sudafed BP's currently appropriate, will continue to monitor closely -> low threshold for transfer to ICU if further titration of meds needed  Left Basal Ganglia Infarct ? Related to encephalopathy below MRI with 2.3 cm acute ischemic L basal ganglia infarct CTA head/neck with near occlusion of proximal M1 segment of L MCA with reconstitution of distal M1 and M2 segments, severe stenosis at origin of bilateral internal carotid arteries Echo 60-65, no RWMA, moderate to severe mitral regurg  A1c 6.5, lipid panel pending, TSH wnl PT/OT/SLP Continue tele monitoring - no clear evidence of afib, cardiology recommending low dose metoprolol daily as tolerated by BP, consider outpatient monitoring for afib after discharge Vascular evaluating per neuro given severe (90%) stenosis on the L -> s/p ultrasound guided access of L common femoral vein with delivery of venous TCAR sheath, shockwave lithotripsy of L internal carotid artery, transcatheter placement of endovascular stent in the L cervical carotid artery after cutdown including angioplasty with distal embolic protection Already on plavix, aspirin started.  Has not tolerated statins in the past.  Zetia. Appreciate neurology assistance  Dysphagia SLP recommending dysphagia 3, thin liquids Will follow SLP recs They note hx of vomiting after eating for about 1 year, consider esophagram while inpatient, could follow outpatient (will  plan for after vascular procedure)  Acute encephalopathy  History of Dementia Related to stroke vs pneumonia MRI as above UA not c/w UTI TSH wnl B12 low normal, MMA wnl PT/OT for weakness Improved today Delirium precautions   Anemia Hb relatively stable, trend  Pulmonary infiltrates treated for possible pneumonia Patient is afebrile with no leukocytosis, normal lactic acid,  has had Kamilo Och cough  CAP coverage, s/p 5 days abx Urine strep negative, urine legionella negative Negative RVP negative covid/flu Blood cx pending   Type 2 diabetes mellitus with stage 3a chronic kidney disease, without long-term current use of insulin (HCC) A1c 6.5 Hold metformin while inpatient SSI and accuchecks QAC/HS    Coronary artery disease involving native heart without angina pectoris Hx of CABG Can not tolerate statins Continue toprol-xl and plavix    Essential hypertension Soft blood pressures Metoprolol as above Ramipril on hold   Chronic kidney disease, stage 3a (HCC) At baseline, continue to monitor    Dementia without behavioral disturbance ([place]) delerium precautions    GERD without esophagitis Continue protonix    Moderate to Severe MR Follow outpatient with cards     DVT prophylaxis: lovenox Code Status: full Family Communication: wife, caregiver at bedside  Disposition:   Status is: Observation The patient remains OBS appropriate and will d/c before 2 midnights.   Consultants:  neurology  Procedures:  none  Antimicrobials:  Anti-infectives (From admission, onward)    Start     Dose/Rate Route Frequency Ordered Stop   07/25/22 1500  ceFAZolin (ANCEF) IVPB 2g/100 mL premix        2 g 200 mL/hr over 30 Minutes Intravenous Every 8 hours 07/25/22 1413 07/25/22 2243   07/20/22 1745  cefTRIAXone (ROCEPHIN) 2 g in sodium chloride 0.9 % 100 mL IVPB        2 g 200 mL/hr over 30 Minutes Intravenous Every 24 hours 07/19/22 1744 07/24/22 1923   07/20/22 1600  azithromycin (ZITHROMAX) 500 mg in sodium chloride 0.9 % 250 mL IVPB        500 mg 250 mL/hr over 60 Minutes Intravenous Every 24 hours 07/19/22 1745 07/24/22 1817   07/19/22 1615  cefTRIAXone (ROCEPHIN) 2 g in sodium chloride 0.9 % 100 mL IVPB        2 g 200 mL/hr over 30 Minutes Intravenous  Once 07/19/22 1607 07/19/22 1725   07/19/22 1615  azithromycin (ZITHROMAX) 500 mg in sodium chloride  0.9 % 250 mL IVPB        500 mg 250 mL/hr over 60 Minutes Intravenous  Once 07/19/22 1607 07/19/22 1742       Subjective: No complaints Wife and caregiver at bedside  Objective: Vitals:   07/26/22 1505 07/26/22 1555 07/26/22 1559 07/26/22 1900  BP: (!) 162/54 (!) 151/65  (!) 139/54  Pulse: 68 (!) 44 87 60  Resp: 14     Temp: (!) 97.4 F (36.3 C)     TempSrc: Oral     SpO2: 97% 97% 98% 98%  Weight:      Height:        Intake/Output Summary (Last 24 hours) at 07/26/2022 2003 Last data filed at 07/26/2022 1721 Gross per 24 hour  Intake 1681.45 ml  Output 1100 ml  Net 581.45 ml   Filed Weights   07/19/22 1753  Weight: 79.4 kg    Examination:  General: No acute distress. Cardiovascular: RRR, brady Lungs: unlabored Abdomen: Soft, nontender, nondistended  Neurological: Alert, pleasantly confused. Moves all extremities 4.  Cranial nerves II through XII grossly intact. Skin: incision to L neck clean and intact Extremities: No clubbing or cyanosis. No edema.  Data Reviewed: I have personally reviewed following labs and imaging studies  CBC: Recent Labs  Lab 07/21/22 0338 07/22/22 0237 07/23/22 0917 07/24/22 0718 07/25/22 1618 07/25/22 1817 07/26/22 0655  WBC 7.9 8.6 8.7 10.2 9.4 12.4* 12.0*  NEUTROABS 4.3 5.0 6.0 6.3  --   --   --   HGB 12.1* 13.0 13.6 13.2 9.5* 11.0* 11.0*  HCT 34.2* 36.6* 39.3 38.3* 26.8* 31.4* 30.9*  MCV 85.7 85.7 86.4 86.8 85.6 85.8 85.6  PLT 176 195 219 206 159 205 179    Basic Metabolic Panel: Recent Labs  Lab 07/21/22 0338 07/22/22 0237 07/23/22 0917 07/24/22 0718 07/25/22 1618 07/26/22 0655  NA 134* 135 134* 135 135 133*  K 3.6 4.3 3.6 3.9 3.9 4.1  CL 104 100 99 101 110 102  CO2 21* '24 26 23 '$ 17* 18*  GLUCOSE 111* 112* 100* 125* 138* 165*  BUN '19 17 15 19 16 '$ 27*  CREATININE 0.91 1.09 1.02 1.07 0.80 1.24  CALCIUM 8.7* 9.4 9.1 9.1 7.5* 8.6*  MG 1.4* 1.8 2.0 1.9  --   --   PHOS 2.8 3.5 3.3 3.2  --   --      GFR: Estimated Creatinine Clearance: 48.9 mL/min (by C-G formula based on SCr of 1.24 mg/dL).  Liver Function Tests: Recent Labs  Lab 07/21/22 0338 07/22/22 0237 07/23/22 0917 07/24/22 0718  AST '19 20 20 18  '$ ALT '9 11 11 11  '$ ALKPHOS 68 70 69 67  BILITOT 0.5 0.9 0.5 0.5  PROT 6.1* 6.7 6.6 6.2*  ALBUMIN 3.2* 3.5 3.4* 3.3*    CBG: Recent Labs  Lab 07/25/22 1724 07/25/22 2052 07/26/22 0618 07/26/22 1212 07/26/22 1640  GLUCAP 149* 229* 182* 150* 166*     Recent Results (from the past 240 hour(s))  Blood Culture (Routine X 2)     Status: None   Collection Time: 07/19/22  2:50 PM   Specimen: BLOOD  Result Value Ref Range Status   Specimen Description BLOOD SITE NOT SPECIFIED  Final   Special Requests   Final    BOTTLES DRAWN AEROBIC AND ANAEROBIC Blood Culture adequate volume   Culture   Final    NO GROWTH 5 DAYS Performed at [facility], [address], [place], [place] [address]    Report Status 07/24/2022 FINAL  Final  Blood Culture (Routine X 2)     Status: None   Collection Time: 07/19/22  3:00 PM   Specimen: BLOOD  Result Value Ref Range Status   Specimen Description BLOOD SITE NOT SPECIFIED  Final   Special Requests   Final    BOTTLES DRAWN AEROBIC AND ANAEROBIC Blood Culture results may not be optimal due to an inadequate volume of blood received in culture bottles   Culture   Final    NO GROWTH 5 DAYS Performed at [facility], [place] [address] [place], [place] [address]    Report Status 07/24/2022 FINAL  Final  Resp Panel by RT-PCR (Flu Lareen Mullings&B, Covid) Anterior Nasal Swab     Status: None   Collection Time: 07/19/22  3:36 PM   Specimen: Anterior Nasal Swab  Result Value Ref Range Status   SARS Coronavirus 2 by RT PCR NEGATIVE NEGATIVE Final    Comment: (NOTE) SARS-CoV-2 target nucleic acids are NOT DETECTED.  The SARS-CoV-2 RNA is generally detectable  in upper respiratory specimens during the acute phase of infection. The  lowest concentration of SARS-CoV-2 viral copies this assay can detect is 138 copies/mL. Mimie Goering negative result does not preclude SARS-Cov-2 infection and should not be used as the sole basis for treatment or other patient management decisions. Nial Hawe negative result may occur with  improper specimen collection/handling, submission of specimen other than nasopharyngeal swab, presence of viral mutation(s) within the areas targeted by this assay, and inadequate number of viral copies(<138 copies/mL). Jhoel Stieg negative result must be combined with clinical observations, patient history, and epidemiological information. The expected result is Negative.  Fact Sheet for Patients:  EntrepreneurPulse.com.au  Fact Sheet for Healthcare Providers:  IncredibleEmployment.be  This test is no t yet approved or cleared by the [place] FDA and  has been authorized for detection and/or diagnosis of SARS-CoV-2 by FDA under an Emergency Use Authorization (EUA). This EUA will remain  in effect (meaning this test can be used) for the duration of the COVID-19 declaration under Section 564(b)(1) of the Act, 21 U.S.C.section 360bbb-3(b)(1), unless the authorization is terminated  or revoked sooner.       Influenza Senora Lacson by PCR NEGATIVE NEGATIVE Final   Influenza B by PCR NEGATIVE NEGATIVE Final    Comment: (NOTE) The Xpert Xpress SARS-CoV-2/FLU/RSV plus assay is intended as an aid in the diagnosis of influenza from Nasopharyngeal swab specimens and should not be used as Journie Howson sole basis for treatment. Nasal washings and aspirates are unacceptable for Xpert Xpress SARS-CoV-2/FLU/RSV testing.  Fact Sheet for Patients: EntrepreneurPulse.com.au  Fact Sheet for Healthcare Providers: IncredibleEmployment.be  This test is not yet approved or cleared by the [place] FDA and has been authorized for detection and/or diagnosis of SARS-CoV-2 by FDA under  an Emergency Use Authorization (EUA). This EUA will remain in effect (meaning this test can be used) for the duration of the COVID-19 declaration under Section 564(b)(1) of the Act, 21 U.S.C. section 360bbb-3(b)(1), unless the authorization is terminated or revoked.  Performed at [facility], [place] [address], [place], [place] [address]   Respiratory (~20 pathogens) panel by PCR     Status: None   Collection Time: 07/19/22  3:36 PM   Specimen: Nasopharyngeal Swab; Respiratory  Result Value Ref Range Status   Adenovirus NOT DETECTED NOT DETECTED Final   Coronavirus 229E NOT DETECTED NOT DETECTED Final    Comment: (NOTE) The Coronavirus on the Respiratory Panel, DOES NOT test for the novel  Coronavirus (2019 nCoV)    Coronavirus HKU1 NOT DETECTED NOT DETECTED Final   Coronavirus NL63 NOT DETECTED NOT DETECTED Final   Coronavirus OC43 NOT DETECTED NOT DETECTED Final   Metapneumovirus NOT DETECTED NOT DETECTED Final   Rhinovirus / Enterovirus NOT DETECTED NOT DETECTED Final   Influenza Karuna Balducci NOT DETECTED NOT DETECTED Final   Influenza B NOT DETECTED NOT DETECTED Final   Parainfluenza Virus 1 NOT DETECTED NOT DETECTED Final   Parainfluenza Virus 2 NOT DETECTED NOT DETECTED Final   Parainfluenza Virus 3 NOT DETECTED NOT DETECTED Final   Parainfluenza Virus 4 NOT DETECTED NOT DETECTED Final   Respiratory Syncytial Virus NOT DETECTED NOT DETECTED Final   Bordetella pertussis NOT DETECTED NOT DETECTED Final   Bordetella Parapertussis NOT DETECTED NOT DETECTED Final   Chlamydophila pneumoniae NOT DETECTED NOT DETECTED Final   Mycoplasma pneumoniae NOT DETECTED NOT DETECTED Final    Comment: Performed at [facility], [place]. [address], [place], [place] [address]  MRSA Next Gen by PCR,  Nasal     Status: None   Collection Time: 07/24/22 10:25 PM   Specimen: Nasal Mucosa; Nasal Swab  Result Value Ref Range Status   MRSA by PCR Next Gen NOT DETECTED NOT DETECTED Final    Comment:  (NOTE) The GeneXpert MRSA Assay (FDA approved for NASAL specimens only), is one component of Janecia Palau comprehensive MRSA colonization surveillance program. It is not intended to diagnose MRSA infection nor to guide or monitor treatment for MRSA infections. Test performance is not FDA approved in patients less than 26 years old. Performed at [facility], [place] [address], [place], [place] [address]          Radiology Studies: Structural Heart Procedure  Result Date: 07/25/2022 See surgical note for result.  HYBRID OR IMAGING ([facility] ONLY)  Result Date: 07/25/2022 There is no interpretation for this exam.  This order is for images obtained during Markayla Reichart surgical procedure.  Please See "Surgeries" Tab for more information regarding the procedure.        Scheduled Meds:  aspirin  300 mg Rectal Daily   Or   aspirin  325 mg Oral Daily   clopidogrel  75 mg Oral Q supper   docusate sodium  100 mg Oral Daily   enoxaparin (LOVENOX) injection  40 mg Subcutaneous Q24H   ezetimibe  10 mg Oral Daily   insulin aspart  0-9 Units Subcutaneous TID WC   metoprolol succinate  12.5 mg Oral Q supper   pantoprazole  40 mg Oral QAC supper   pseudoephedrine  30 mg Oral Q6H   traZODone  50 mg Oral QHS   Continuous Infusions:  sodium chloride 100 mL/hr at 07/26/22 0419   DOPamine 2 mcg/kg/min (07/26/22 1641)   magnesium sulfate bolus IVPB       LOS: 6 days    Time spent: over 30 min    Fayrene Helper, MD [facility]   To contact the attending provider between 7A-7P or the covering provider during after hours 7P-7A, please log into the web site [email or web address] and access using universal [place] password for that web site. If you do not have the password, please call the hospital operator.  07/26/2022, 8:03 PM

## 2022-07-26 NOTE — Plan of Care (Signed)

## 2022-07-26 NOTE — Care Management Important Message (Signed)
Important Message  Patient Details  Name: [name] MRN: [id number] Date of Birth: 1935-08-12   Medicare Important Message Given:  Yes     Shelda Altes 07/26/2022, 11:08 AM

## 2022-07-26 NOTE — Discharge Instructions (Signed)
   [facility]  Discharge Instructions   Carotid Surgery  Please refer to the following instructions for your post-procedure care. Your surgeon or physician assistant will discuss any changes with you.  Activity  You are encouraged to walk as much as you can. You can slowly return to normal activities but must avoid strenuous activity and heavy lifting until your doctor tell you it's okay. Avoid activities such as vacuuming or swinging a golf club. You can drive after one week if you are comfortable and you are no longer taking prescription pain medications. It is normal to feel tired for serval weeks after your surgery. It is also normal to have difficulty with sleep habits, eating, and bowel movements after surgery. These will go away with time.  Bathing/Showering  Shower daily after you go home. Do not soak in a bathtub, hot tub, or swim until the incision heals completely.  Incision Care  Shower every day. Clean your incision with mild soap and water. Pat the area dry with a clean towel. You do not need a bandage unless otherwise instructed. Do not apply any ointments or creams to your incision. You may have skin glue on your incision. Do not peel it off. It will come off on its own in about one week. Your incision may feel thickened and raised for several weeks after your surgery. This is normal and the skin will soften over time.   For Men Only: It's okay to shave around the incision but do not shave the incision itself for 2 weeks. It is common to have numbness under your chin that could last for several months.  Diet  Resume your normal diet. There are no special food restrictions following this procedure. A low fat/low cholesterol diet is recommended for all patients with vascular disease. In order to heal from your surgery, it is CRITICAL to get adequate nutrition. Your body requires vitamins, minerals, and protein. Vegetables are the best source of  vitamins and minerals. Vegetables also provide the perfect balance of protein. Processed food has little nutritional value, so try to avoid this.  Medications  Resume taking all of your medications unless your doctor or physician assistant tells you not to. If your incision is causing pain, you may take over-the- counter pain relievers such as acetaminophen (Tylenol). If you were prescribed a stronger pain medication, please be aware these medications can cause nausea and constipation. Prevent nausea by taking the medication with a snack or meal. Avoid constipation by drinking plenty of fluids and eating foods with a high amount of fiber, such as fruits, vegetables, and grains.   Do not take Tylenol if you are taking prescription pain medications.  Follow Up  Our office will schedule a follow up appointment 2-3 weeks following discharge.  Please call us immediately for any of the following conditions  . Increased pain, redness, drainage (pus) from your incision site. . Fever of 101 degrees or higher. . If you should develop stroke (slurred speech, difficulty swallowing, weakness on one side of your body, loss of vision) you should call 911 and go to the nearest emergency room. .  Reduce your risk of vascular disease:  . Stop smoking. If you would like help call [facility] at 1-800-QUIT-NOW (1-800-784-8669) or [place] at 336-586-4000. . Manage your cholesterol . Maintain a desired weight . Control your diabetes . Keep your blood pressure down .  If you have any questions, please call the office at [phone]. 

## 2022-07-26 NOTE — Progress Notes (Signed)
Physical Therapy Treatment Patient Details Name: [name] MRN: [id number] DOB: 10-13-1935 Today's Date: 07/26/2022   History of Present Illness Pt is an 87 y/o male presenting with increased confusion. Imaging showed L BG infarct. PMH: CAD, DM, GERD, dementia, HTN, PAD , R TKA    PT Comments    Pt received supine and will to attempt OOB mobility. Pt answering some questions this session and more engaged sitting upright EOB. Pt continues to need max assist to come to sitting EOB however able to initiate BLEs toward EOB. Session focused on trunk control and sitting balance with pt able to intermittently find and maintain midline with multimodal cues and +2 assist. Pt able to come to standing in stedy frame with +2 assist for increased LE strength and functional mobility progression. Pt wife and caregiver present, helpful, encouraging and appreciative. Current plan remains appropriate to address deficits and maximize functional independence and decrease caregiver burden. Pt continues to benefit from skilled PT services to progress toward functional mobility goals.    Recommendations for follow up therapy are one component of a multi-disciplinary discharge planning process, led by the attending physician.  Recommendations may be updated based on patient status, additional functional criteria and insurance authorization.  Follow Up Recommendations  Skilled nursing-short term rehab (<3 hours/day) Can patient physically be transported by private vehicle: No   Assistance Recommended at Discharge Frequent or constant Supervision/Assistance  Patient can return home with the following Two people to help with walking and/or transfers;Two people to help with bathing/dressing/bathroom;Assistance with cooking/housework;Help with stairs or ramp for entrance;Assist for transportation   Equipment Recommendations  None recommended by PT    Recommendations for Other Services       Precautions /  Restrictions Precautions Precautions: Fall Precaution Comments: retropulsion Restrictions Weight Bearing Restrictions: No     Mobility  Bed Mobility Overal bed mobility: Needs Assistance Bed Mobility: Supine to Sit, Sit to Supine     Supine to sit: HOB elevated, Mod assist Sit to supine: +2 for physical assistance, Max assist   General bed mobility comments: assist for legs off bed and to lift trunk (heavy lift) from bed; to supine, assist for legs and trunk positioning.    Transfers Overall transfer level: Needs assistance Equipment used: Ambulation equipment used Transfers: Sit to/from Stand Sit to Stand: Mod assist, +2 physical assistance, +2 safety/equipment, From elevated surface           General transfer comment: used Stedy for sit to stand, to initiate anterior weight shift and to block feet due to posterior bias; +2 for lifting, encouraging COG over BOS and for balance due to R lateral lean, cues to bring hips forward, pt able to complete x2 standing trials    Ambulation/Gait                   Stairs             Wheelchair Mobility    Modified Rankin (Stroke Patients Only)       Balance Overall balance assessment: Needs assistance Sitting-balance support: No upper extremity supported, Feet supported Sitting balance-Leahy Scale: Poor Sitting balance - Comments: Variable Min-Max A for sitting balance; extended time on EOB working on anterior weight shift with variety of techniques, hands on chair in front of pt. hands on PTA kneesm stretching low back forward with manual assist,  having pt place forehead on therapist forehead, flexing elbows for anterior weight shift, working on postural awareness and trunk flexibility Postural  control: Posterior lean, Right lateral lean Standing balance support: Bilateral upper extremity supported Standing balance-Leahy Scale: Zero Standing balance comment: +2 and UE support for standing due to posterior and L  lateral lean                            Cognition Arousal/Alertness: Awake/alert Behavior During Therapy: WFL for tasks assessed/performed Overall Cognitive Status: History of cognitive impairments - at baseline                                 General Comments: hx of dementia, follows one step directions fairly consistently. minimally verbal but does answer direct questions        Exercises Other Exercises Other Exercises: AAROM marching in sitting x20 x 2 sets Other Exercises: reaching outside BOS with single UE support on back of chair Other Exercises: supine "punches" with graded resistance x10 ea side    General Comments General comments (skin integrity, edema, etc.): Caregiver Joy present and helpful, Wife also present and very supportive and apprecivate of session      Pertinent Vitals/Pain Pain Assessment Pain Assessment: No/denies pain    Home Living                          Prior Function            PT Goals (current goals can now be found in the care plan section) Acute Rehab PT Goals Patient Stated Goal: for pt to get stronger per family PT Goal Formulation: With patient Time For Goal Achievement: 08/03/22    Frequency    Min 3X/week      PT Plan Current plan remains appropriate    Co-evaluation              AM-PAC PT "6 Clicks" Mobility   Outcome Measure  Help needed turning from your back to your side while in a flat bed without using bedrails?: Total Help needed moving from lying on your back to sitting on the side of a flat bed without using bedrails?: Total Help needed moving to and from a bed to a chair (including a wheelchair)?: Total Help needed standing up from a chair using your arms (e.g., wheelchair or bedside chair)?: Total Help needed to walk in hospital room?: Total Help needed climbing 3-5 steps with a railing? : Total 6 Click Score: 6    End of Session   Activity Tolerance: Patient  tolerated treatment well Patient left: in bed;with call bell/phone within reach;with family/visitor present;with bed alarm set Nurse Communication: Mobility status PT Visit Diagnosis: Unsteadiness on feet (R26.81);Muscle weakness (generalized) (M62.81);Difficulty in walking, not elsewhere classified (R26.2)     Time: 9924-2683 PT Time Calculation (min) (ACUTE ONLY): 26 min  Charges:  $Therapeutic Activity: 23-37 mins                    Abrian Hanover R. PTA Acute Rehabilitation Services Office: [place] 07/26/2022, 2:28 PM

## 2022-07-26 NOTE — Care Management Important Message (Deleted)
Important Message  Patient Details  Name: [name] MRN: [id number] Date of Birth: 1935/04/08   Medicare Important Message Given:  Yes     Shelda Altes 07/26/2022, 12:46 PM

## 2022-07-26 NOTE — Progress Notes (Signed)
MD notified:  Systolic BP > 859 and dopamine gtts stopped per order and noted systolic BP 29-24'M.  Orders received to restart dopamine at lower set dose.

## 2022-07-26 NOTE — Progress Notes (Signed)
Late Entry  07/22/22 1200  SLP Visit Information  SLP Received On 07/22/22  General Information  HPI [name] is a 87 y.o. male with medical history significant of CAD, T2DM, GERD, HLD, HTN, PAD, hx of CVA who presented to ED with change in mental status. At baseline he does talk, but very little. He walks around the house with walker. He needs full assist x 2 for showering. He needs help with dressing. He can feed himself. No falls in the last year.  He has not had any fever/chills. No open sores or bed sores. He has been coughing for 2 days that is dry,but at times is clear phlegm. No odor to urine, unsure of color change. He has been eating and drinking well. Pulmonary infiltratesBeing treated for possible pneumonia. MRI shows left basal ganglia CVA. Pt had MBS in July 20223 that showed esopahgeal stasis, flash penetration, oral holding.  Type of Study Bedside Swallow Evaluation  Previous Swallow Assessment MBS 04/2022 - regular/thin  Diet Prior to this Study Dysphagia 3 (soft);Thin liquids  Temperature Spikes Noted No  Respiratory Status Room air  History of Recent Intubation No  Behavior/Cognition Lethargic/Drowsy;Requires cueing  Oral Cavity Assessment WFL  Oral Care Completed by SLP No  Oral Cavity - Dentition Adequate natural dentition  Vision Impaired for self-feeding  Self-Feeding Abilities Total assist  Patient Positioning Upright in bed  Baseline Vocal Quality Normal  Volitional Cough Cognitively unable to elicit  Volitional Swallow Unable to elicit  Oral Motor/Sensory Function  Overall Oral Motor/Sensory Function WFL  Thin Liquid  Thin Liquid Impaired  Pharyngeal  Phase Impairments Cough - Delayed  Nectar Thick Liquid  Nectar Thick Liquid WFL  Honey Thick Liquid  Honey Thick Liquid NT  Puree  Puree WFL  Solid  Solid NT  SLP Assessment  Clinical Impression Statement (ACUTE ONLY) Pt reassessed due to report from wife that he vomited/regurgitated yesterday after meal  and had some coughing with water. Pts son at bedside and wife on phone. Pt sleeping but woke up, though he was less attentive than prior assessment. SLP fed pt sips of juice, no immediate signs of aspiraiton, but pt did have delayed coughing. This is consistent with MBS that showed signs of esophageal dysphagia with coughing unrelated to aspiration. However, given altered mental status and poor positioning, will intiaite more restricitve but more maneageable texture. Pt downgraded to dys 2/nectar with esophageal precautions posted and discussed with family. Will f/u.  SLP Visit Diagnosis Dysphagia, unspecified (R13.10)  Impact on safety and function Mild aspiration risk  Other Related Risk Factors History of esophageal-related issues  Swallow Evaluation Recommendations  SLP Diet Recommendations Dysphagia 2 (Fine chop);Nectar-thick liquid  Liquid Administration via Cup;Straw  Medication Administration Crushed with puree  Supervision Staff to assist with self feeding;Full supervision/cueing for compensatory strategies  Compensations Small sips/bites;Slow rate;Follow solids with liquid  Postural Changes Seated upright at 90 degrees;Remain upright for at least 30 minutes after po intake  Treatment Plan  Oral Care Recommendations Oral care BID  Treatment Recommendations Therapy as outlined in treatment plan below  Follow Up Recommendations No SLP follow up  Assistance recommended at discharge Frequent or constant Supervision/Assistance  Functional Status Assessment Patient has had a recent decline in their functional status and demonstrates the ability to make significant improvements in function in a reasonable and predictable amount of time.  Speech Therapy Frequency (ACUTE ONLY) min 2x/week  Treatment Duration 2 weeks  Interventions Aspiration precaution training  Individuals Consulted  Consulted and Agree with Results and Recommendations Patient;Family member/caregiver  Progression Toward Goals   Progression toward goals Progressing toward goals  SLP Time Calculation  SLP Start Time (ACUTE ONLY) 1000  SLP Stop Time (ACUTE ONLY) 1020  SLP Time Calculation (min) (ACUTE ONLY) 20 min  SLP Evaluations  $ SLP Speech Visit 1 Visit  SLP Evaluations  $BSS Swallow 1 Procedure  $Swallowing Treatment 1 Procedure

## 2022-07-27 DIAGNOSIS — F0390 Unspecified dementia without behavioral disturbance: Secondary | ICD-10-CM | POA: Diagnosis not present

## 2022-07-27 DIAGNOSIS — R579 Shock, unspecified: Secondary | ICD-10-CM | POA: Diagnosis not present

## 2022-07-27 DIAGNOSIS — I10 Essential (primary) hypertension: Secondary | ICD-10-CM | POA: Diagnosis not present

## 2022-07-27 DIAGNOSIS — G9340 Encephalopathy, unspecified: Secondary | ICD-10-CM | POA: Diagnosis not present

## 2022-07-27 DIAGNOSIS — R918 Other nonspecific abnormal finding of lung field: Secondary | ICD-10-CM

## 2022-07-27 DIAGNOSIS — N1831 Chronic kidney disease, stage 3a: Secondary | ICD-10-CM | POA: Diagnosis not present

## 2022-07-27 LAB — PHOSPHORUS: Phosphorus: 2.1 mg/dL — ABNORMAL LOW (ref 2.5–4.6)

## 2022-07-27 LAB — CBC WITH DIFFERENTIAL/PLATELET
Abs Immature Granulocytes: 0.04 10*3/uL (ref 0.00–0.07)
Basophils Absolute: 0 10*3/uL (ref 0.0–0.1)
Basophils Relative: 0 %
Eosinophils Absolute: 0.1 10*3/uL (ref 0.0–0.5)
Eosinophils Relative: 1 %
HCT: 30.8 % — ABNORMAL LOW (ref 39.0–52.0)
Hemoglobin: 10.9 g/dL — ABNORMAL LOW (ref 13.0–17.0)
Immature Granulocytes: 0 %
Lymphocytes Relative: 23 %
Lymphs Abs: 2.3 10*3/uL (ref 0.7–4.0)
MCH: 29.9 pg (ref 26.0–34.0)
MCHC: 35.4 g/dL (ref 30.0–36.0)
MCV: 84.4 fL (ref 80.0–100.0)
Monocytes Absolute: 0.9 10*3/uL (ref 0.1–1.0)
Monocytes Relative: 9 %
Neutro Abs: 6.6 10*3/uL (ref 1.7–7.7)
Neutrophils Relative %: 67 %
Platelets: 241 10*3/uL (ref 150–400)
RBC: 3.65 MIL/uL — ABNORMAL LOW (ref 4.22–5.81)
RDW: 13.2 % (ref 11.5–15.5)
WBC: 10 10*3/uL (ref 4.0–10.5)
nRBC: 0 % (ref 0.0–0.2)

## 2022-07-27 LAB — GLUCOSE, CAPILLARY
Glucose-Capillary: 132 mg/dL — ABNORMAL HIGH (ref 70–99)
Glucose-Capillary: 127 mg/dL — ABNORMAL HIGH (ref 70–99)
Glucose-Capillary: 143 mg/dL — ABNORMAL HIGH (ref 70–99)
Glucose-Capillary: 152 mg/dL — ABNORMAL HIGH (ref 70–99)

## 2022-07-27 LAB — COMPREHENSIVE METABOLIC PANEL
ALT: 10 U/L (ref 0–44)
AST: 26 U/L (ref 15–41)
Albumin: 3.1 g/dL — ABNORMAL LOW (ref 3.5–5.0)
Alkaline Phosphatase: 56 U/L (ref 38–126)
Anion gap: 8 (ref 5–15)
BUN: 23 mg/dL (ref 8–23)
CO2: 22 mmol/L (ref 22–32)
Calcium: 8.6 mg/dL — ABNORMAL LOW (ref 8.9–10.3)
Chloride: 104 mmol/L (ref 98–111)
Creatinine, Ser: 0.99 mg/dL (ref 0.61–1.24)
GFR, Estimated: >60 mL/min (ref >60)
Glucose, Bld: 127 mg/dL — ABNORMAL HIGH (ref 70–99)
Potassium: 3.7 mmol/L (ref 3.5–5.1)
Sodium: 134 mmol/L — ABNORMAL LOW (ref 135–145)
Total Bilirubin: 0.6 mg/dL (ref 0.3–1.2)
Total Protein: 5.8 g/dL — ABNORMAL LOW (ref 6.5–8.1)

## 2022-07-27 LAB — MAGNESIUM: Magnesium: 1.5 mg/dL — ABNORMAL LOW (ref 1.7–2.4)

## 2022-07-27 MED ORDER — MIDODRINE HCL 5 MG PO TABS
10.0000 mg | ORAL_TABLET | Freq: Three times a day (TID) | ORAL | Status: DC
  Administered 2022-07-27 – 2022-07-29 (×5): 10 mg via ORAL
  Filled 2022-07-27 (×5): qty 2

## 2022-07-27 MED ORDER — MIDODRINE HCL 5 MG PO TABS
2.5000 mg | ORAL_TABLET | Freq: Three times a day (TID) | ORAL | Status: DC
  Administered 2022-07-27: 2.5 mg via ORAL
  Filled 2022-07-27: qty 1

## 2022-07-27 MED ORDER — DEXTROSE-NACL 5-0.45 % IV SOLN: INTRAVENOUS | Status: DC

## 2022-07-27 MED ORDER — METOPROLOL TARTRATE 12.5 MG HALF TABLET
12.5000 mg | ORAL_TABLET | Freq: Every day | ORAL | Status: DC
  Administered 2022-07-28 – 2022-07-30 (×3): 12.5 mg via ORAL
  Filled 2022-07-27 (×4): qty 1

## 2022-07-27 MED ORDER — SODIUM CHLORIDE 0.9 % IV BOLUS
1000.0000 mL | Freq: Once | INTRAVENOUS | Status: AC
  Administered 2022-07-27: 1000 mL via INTRAVENOUS

## 2022-07-27 MED ORDER — SODIUM CHLORIDE 0.9 % IV BOLUS: 250.0000 mL | Freq: Once | INTRAVENOUS | Status: DC

## 2022-07-27 NOTE — Consult Note (Signed)
NAME:  [name], MRN:  [id number], DOB:  [birth year], LOS: 7 ADMISSION DATE:  07/19/2022, CONSULTATION DATE:  07/27/22 REFERRING MD:  TRH, CHIEF COMPLAINT:  Hypotension   History of Present Illness:  [name] is a 87 y.o. male with medical history significant of CAD, T2DM, GERD, HLD, HTN, PAD, hx of CVA who presented to ED with change in mental status found to have CVA noted to have L carotid stenosis and now is s/p vascular intervention.  Currently on dopamine and sudafed per vascular for hypotension post op. Pt became hypotensive when dopamine was discontinued and PCCM was consulted to assist with hypotension.  Pertinent  Medical History  CAD, T2DM, GERD, HLD, HTN, PAD, hx of CVA  Significant Hospital Events: Including procedures, antibiotic start and stop dates in addition to other pertinent events    Interim History / Subjective:  Denies any acute concerns. States he is not hungry or thirsty.  Review of Systems:   Negative unless stated in the HPI.  Objective   Blood pressure (!) 163/65, pulse 60, temperature 98.4 F (36.9 C), temperature source Oral, resp. rate 18, height '6\' 2"'$  (1.88 m), weight 79.4 kg, SpO2 93 %.        Intake/Output Summary (Last 24 hours) at 07/27/2022 1639 Last data filed at 07/27/2022 0400 Gross per 24 hour  Intake --  Output 820 ml  Net -820 ml   Filed Weights   07/19/22 1753  Weight: 79.4 kg   Examination: General: NAD HENT: Dry MM Lungs: CTAB Cardiovascular: IRIR Abdomen: No TTP Extremities: No asymmetry Neuro: Alert and oriented x2 GU: suction catheter present  Assessment & Plan:  Hypotension Appears secondary to poor intake and altered baroreceptor response given recent carotid procedure. Pt had response to leg elevation.  Low concern for infection as pt is afebrile, and CBC today without any leukocytosis. Will give 1 L fluid bolus and after that 100 cc/hr. Increase midodrine to 10 mg TID. Will continue to follow and will transfer to  ICU for close monitoring if pt becomes hypotensive again for close monitoring. MAP goal >34 and systolic goal >74. -1 L NS bolus followed by 50 cc/hr of D5-1/2NS - Increase midodrine to 10 mg TID - Continue to monitor - Hold antihypertensive metoprolol which was given today, hold ramipril  - Basal Ganglia Infarction: Continue plavix, aspirin, and continue management per primary team and neurology T2DM-SSI HTN/CKD-As above HLD/CAD-Continue plavix Dementia-Delirium precautions GERD-Continue PPI Other conditions per primary team.   Best Practice (right click and "Reselect all SmartList Selections" daily)  Diet/type: dysphagia diet (see orders) DVT prophylaxis: LMWH GI prophylaxis: PPI Lines: N/A Foley:  N/A Continuous: Dopamine   Code Status:  full code Last date of multidisciplinary goals of care discussion [NA] Labs   CBC: Recent Labs  Lab 07/21/22 0338 07/22/22 0237 07/23/22 0917 07/24/22 0718 07/25/22 1618 07/25/22 1817 07/26/22 0655 07/27/22 0500  WBC 7.9 8.6 8.7 10.2 9.4 12.4* 12.0* 10.0  NEUTROABS 4.3 5.0 6.0 6.3  --   --   --  6.6  HGB 12.1* 13.0 13.6 13.2 9.5* 11.0* 11.0* 10.9*  HCT 34.2* 36.6* 39.3 38.3* 26.8* 31.4* 30.9* 30.8*  MCV 85.7 85.7 86.4 86.8 85.6 85.8 85.6 84.4  PLT 176 195 219 206 159 205 228 259   Basic Metabolic Panel: Recent Labs  Lab 07/21/22 0338 07/22/22 0237 07/23/22 0917 07/24/22 0718 07/25/22 1618 07/26/22 0655 07/27/22 0500  NA 134* 135 134* 135 135 133* 134*  K 3.6 4.3  3.6 3.9 3.9 4.1 3.7  CL 104 100 99 101 110 102 104  CO2 21* '24 26 23 '$ 17* 18* 22  GLUCOSE 111* 112* 100* 125* 138* 165* 127*  BUN '19 17 15 19 16 '$ 27* 23  CREATININE 0.91 1.09 1.02 1.07 0.80 1.24 0.99  CALCIUM 8.7* 9.4 9.1 9.1 7.5* 8.6* 8.6*  MG 1.4* 1.8 2.0 1.9  --   --  1.5*  PHOS 2.8 3.5 3.3 3.2  --   --  2.1*   GFR: Estimated Creatinine Clearance: 61.3 mL/min (by C-G formula based on SCr of 0.99 mg/dL). Recent Labs  Lab 07/25/22 1618 07/25/22 1817  07/26/22 0655 07/27/22 0500  WBC 9.4 12.4* 12.0* 10.0   Liver Function Tests: Recent Labs  Lab 07/21/22 0338 07/22/22 0237 07/23/22 0917 07/24/22 0718 07/27/22 0500  AST '19 20 20 18 26  '$ ALT '9 11 11 11 10  '$ ALKPHOS 68 70 69 67 56  BILITOT 0.5 0.9 0.5 0.5 0.6  PROT 6.1* 6.7 6.6 6.2* 5.8*  ALBUMIN 3.2* 3.5 3.4* 3.3* 3.1*   No results for input(s): "LIPASE", "AMYLASE" in the last 168 hours. No results for input(s): "AMMONIA" in the last 168 hours. ABG    Component Value Date/Time   PHART 7.390 05/07/2008 1853   PCO2ART 40.9 05/07/2008 1853   PO2ART 89.0 05/07/2008 1853   HCO3 25.9 07/19/2022 1537   TCO2 27 07/19/2022 1537   ACIDBASEDEF 1.0 07/19/2022 1537   O2SAT 96 07/19/2022 1537    Coagulation Profile: No results for input(s): "INR", "PROTIME" in the last 168 hours. Cardiac Enzymes: No results for input(s): "CKTOTAL", "CKMB", "CKMBINDEX", "TROPONINI" in the last 168 hours. HbA1C: Hgb A1c MFr Bld  Date/Time Value Ref Range Status  07/19/2022 05:54 PM 6.5 (H) 4.8 - 5.6 % Final    Comment:    (NOTE) Pre diabetes:          5.7%-6.4%  Diabetes:              >6.4%  Glycemic control for   <7.0% adults with diabetes   07/10/2021 12:30 AM 6.3 (H) 4.8 - 5.6 % Final    Comment:    (NOTE) Pre diabetes:          5.7%-6.4%  Diabetes:              >6.4%  Glycemic control for   <7.0% adults with diabetes    CBG: Recent Labs  Lab 07/26/22 1640 07/26/22 2103 07/27/22 0608 07/27/22 1142 07/27/22 1604  GLUCAP 166* 166* 132* 127* 143*   Past Medical History:  He,  has a past medical history of Arthritis, CAD (coronary artery disease), Cataract, Diabetes mellitus, GERD (gastroesophageal reflux disease), Helicobacter pylori gastritis (01/2006, 03/2011), Hiccups, History of kidney stones, Hyperlipidemia, Hypertension, and PAD (peripheral artery disease) ([place]).  Surgical History:   Past Surgical History:  Procedure Laterality Date   CATARACT EXTRACTION     right    COLONOSCOPY  2002, 2007, 06/09/2011   Dr. Evelina Bucy - [name], [place]: for FHx colon cancer, no adenomas; 2012: [facility] - normal   CORONARY ARTERY BYPASS GRAFT  05-07-2008   ENDARTERECTOMY FEMORAL Bilateral 03/23/2015   Procedure: ENDARTERECTOMY OF BILATERAL COMMON FEMORAL AND PROFUNDA ARTERIES;  Surgeon: Rosetta Posner, MD;  Location: [facility];  Service: Vascular;  Laterality: Bilateral;   ESOPHAGOGASTRODUODENOSCOPY  02/20/06   Dr. Felipa Evener, [place]. pylori gastritis and GERD changes (pathology)   EYE SURGERY Left    cataract   HERNIA REPAIR  12/24/2010  INSERTION OF ILIAC STENT Bilateral 03/23/2015   Procedure: LEFT EXTERNAL ILIAC AND BILATERAL COMMON ILIAC STENT;  Surgeon: Rosetta Posner, MD;  Location[facility];  Service: Vascular;  Laterality: Bilateral;   JOINT REPLACEMENT     right knee replacement   PATCH ANGIOPLASTY Bilateral 03/23/2015   Procedure: PATCH ANGIOPLASTY OF BILATERAL COMMON FEMORAL ARTERIES;  Surgeon: Rosetta Posner, MD;  Location: [facility];  Service: Vascular;  Laterality: Bilateral;   PERIPHERAL VASCULAR CATHETERIZATION N/A 03/11/2015   Procedure: Abdominal Aortogram w/Lower Extremity;  Surgeon: Rosetta Posner, MD;  Location: [facility];  Service: Cardiovascular;  Laterality: N/A;   TOTAL KNEE ARTHROPLASTY  07-13-2012   right   TRANSCAROTID ARTERY REVASCULARIZATION  Left 07/25/2022   Procedure: Transcarotid Artery Revascularization W/ SHOCK WAVE;  Surgeon: Marty Heck, MD;  Location: [facility] OR;  Service: Vascular;  Laterality: Left;   UPPER GASTROINTESTINAL ENDOSCOPY  03/09/2009   Dr. Carlean Purl: H. pylori gastritis ([place] Tx) and 2 cm hiatal hernia    Social History:   reports that he has quit smoking. His smoking use included cigarettes. He has a 40.00 pack-year smoking history. He has never used smokeless tobacco. He reports current alcohol use. He reports that he does not use drugs.  Family History:  His family history includes Cancer in his brother, sister, and sister;  Colon cancer in his sister; Crohn's disease in his son; Diabetes in his brother, father, and sister; Heart attack in his brother, brother, father, and sister; Heart disease in his brother, father, and sister; Hyperlipidemia in his brother and sister; Hypertension in his brother, father, and sister; Leukemia in his sister; Myasthenia gravis in his sister; Other in his brother and sister; Varicose Veins in his brother, father, and sister.  Allergies Allergies  Allergen Reactions   Atorvastatin Other (See Comments)    Muscle pain   Rosuvastatin Other (See Comments)    Muscle pain   Simvastatin Other (See Comments)    Muscle pain   Sulfonamide Derivatives Hives    Home Medications  Prior to Admission medications   Medication Sig Start Date End Date Taking? Authorizing Provider  acetaminophen (TYLENOL) 500 MG tablet Take 500 mg by mouth every 6 (six) hours as needed for moderate pain.   Yes [provider]  cholecalciferol (VITAMIN D3) 25 MCG (1000 UNIT) tablet Take 1,000 Units by mouth daily.   Yes [provider]  clopidogrel (PLAVIX) 75 MG tablet Take 75 mg by mouth daily with supper.   Yes [provider]  magnesium oxide (MAG-OX) 400 (240 Mg) MG tablet Take 1 tablet (400 mg total) by mouth daily. 07/14/21  Yes Eulogio Bear U, DO  metFORMIN (GLUCOPHAGE) 1000 MG tablet Take 1 tablet (1,000 mg total) by mouth daily with supper. Patient taking differently: Take 1,500 mg by mouth daily with supper. 07/13/21  Yes Eulogio Bear U, DO  metoprolol succinate (TOPROL-XL) 25 MG 24 hr tablet Take 25 mg by mouth daily with supper.   Yes [provider]  pantoprazole (PROTONIX) 40 MG tablet Take 40 mg by mouth daily before supper. 04/14/20  Yes [provider]  ramipril (ALTACE) 5 MG capsule Take 5 mg by mouth daily. 09/10/21  Yes [provider]  glucose blood (ONETOUCH VERIO) test strip Check sugars daily Dx: E11.49 09/11/18   [provider]   Misc. Devices [facility]) MISC Dx: Muscle Weakness 11/27/19   [provider]  OneTouch Delica Lancets 91Y MISC Pt to check BS daily  Dx: E11.49 02/24/20   [provider]  polyethylene glycol (MIRALAX / GLYCOLAX) 17 g packet Take 17 g by mouth daily as needed for mild constipation. Patient not taking: Reported on 07/19/2022 07/13/21   Geradine Girt, DO    Idamae Schuller, MD Tillie Rung. [facility] Res[facility], PGY-2

## 2022-07-27 NOTE — Progress Notes (Signed)
PROGRESS NOTE    [name]  WIO:[id number] DOB: November 21, 1934 DOA: 07/19/2022 PCP: Ginger Organ., MD   Brief Narrative: No notes on file   Assessment and Plan:  Postoperative hypotension Patient initially requiring dopamine and pseudoephedrine immediately post-op which was titrated to dopamine monotherapy. Patient on dopamine 2 mcg/kg/hr with inability to titrate down today secondary to refractory hypotension. Discussed with PCCM who recommended IV fluid bolus followed by maintenance IV fluids. -Vascular surgery recommendations: continue dopamine 2 mcg/kg/hr, midodrine -PCCM recommendations: IV fluids, increase to midodrine 10 mg TID  Acute left basal ganglia infarct MRI confirms left basal ganglia infarct. CTA head/neckl results below. LDL of 104. Hemoglobin A1C of 6.5%. Transthoracic Echocardiogram significant for LVEF of 60-65%. Neurology recommendations for outpatient follow-up and antiplatelet therapy per vascular surgery. Patient is statin intolerant. PT/OT recommendations for SNF.   Severe intracranial/extraluminal stenosis CTA head/neck significant for left proximal M1 near occlusion in addition to 75% right ICA and 90% left ICA stenosis, left subclavian moderate stenosis proximal to left VA takeoff and bilateral ICA siphon severe atherosclerosis. Vascular surgery consulted and performed left TCAR with shock wave on 9/25  Dysphagia Patient evaluated by speech therapy. Recommendation for dysphagia 2 diet and staff to assist with self feeding.  Acute metabolic encephalopathy Multifactorial.   Acute anemia Baseline hemoglobin of about 13. Hemoglobin down to a low of 9.5 this admission and is stable. No evidence of bleeding. Likely related to acute perioperative blood loss.  Pulmonary infiltrates Concern for possible pneumonia; clinically undetermined. Patient started on Ceftriaxone and azithromycin for   Diabetes mellitus type 2 -Continue SSI  CAD History of CABG.  Not on a statin secondary to intolerance. Patient is on Toprol XL and Plavix. Asymptomatic. Toprol XL held secondary to hypotension. -Continue Plavix and Zetia  CKD stage IIIa Stable.  Primary hypertension Patient is on ramipril and metoprolol which are now held secondary to refractory hypotension.  Dementia -Continue delirium precautions  GERD without esophagitis -Continue Protonix  Moderate/severe mitral valve regurgitation Noted. Outpatient follow-up.   DVT prophylaxis: Lovenox Code Status:   Code Status: Full Code Family Communication: None at bedside Disposition Plan: Discharge to SNF pending improvement in hypotension and specialist recommendations for discharge readiness.   Consultants:  Neurology Vascular surgery PCCM  Procedures:  Transcarotid Artery Revascularization W/ SHOCK WAVE (Left: Neck) Transthoracic Echocardiogram  Antimicrobials: Ceftriaxone Azithromycin Cefazolin    Subjective: Patient reports no concerns this afternoon  Objective: BP 125/77   Pulse 67   Temp 98.4 F (36.9 C) (Oral)   Resp 18   Ht '6\' 2"'$  (1.88 m)   Wt 79.4 kg   SpO2 93%   BMI 22.47 kg/m   Examination:  General exam: Appears calm and comfortable Respiratory system: Clear to auscultation. Respiratory effort normal. Cardiovascular system: S1 & S2 heard, RRR. Gastrointestinal system: Abdomen is nondistended, soft and nontender. No organomegaly or masses felt. Normal bowel sounds heard. Central nervous system: Alert and oriented to self and place. Musculoskeletal: No edema. No calf tenderness Skin: No cyanosis. No rashes Psychiatry: Judgement and insight appear normal. Mood & affect appropriate.    Data Reviewed: I have personally reviewed following labs and imaging studies  CBC Lab Results  Component Value Date   WBC 10.0 07/27/2022   RBC 3.65 (L) 07/27/2022   HGB 10.9 (L) 07/27/2022   HCT 30.8 (L) 07/27/2022   MCV 84.4 07/27/2022   MCH 29.9 07/27/2022   PLT  241 07/27/2022   MCHC 35.4 07/27/2022  RDW 13.2 07/27/2022   LYMPHSABS 2.3 07/27/2022   MONOABS 0.9 07/27/2022   EOSABS 0.1 07/27/2022   BASOSABS 0.0 54/00/8676     Last metabolic panel Lab Results  Component Value Date   NA 134 (L) 07/27/2022   K 3.7 07/27/2022   CL 104 07/27/2022   CO2 22 07/27/2022   BUN 23 07/27/2022   CREATININE 0.99 07/27/2022   GLUCOSE 127 (H) 07/27/2022   GFRNONAA >60 07/27/2022   GFRAA >60 03/24/2015   CALCIUM 8.6 (L) 07/27/2022   PHOS 2.1 (L) 07/27/2022   PROT 5.8 (L) 07/27/2022   ALBUMIN 3.1 (L) 07/27/2022   BILITOT 0.6 07/27/2022   ALKPHOS 56 07/27/2022   AST 26 07/27/2022   ALT 10 07/27/2022   ANIONGAP 8 07/27/2022    GFR: Estimated Creatinine Clearance: 61.3 mL/min (by C-G formula based on SCr of 0.99 mg/dL).  Recent Results (from the past 240 hour(s))  Blood Culture (Routine X 2)     Status: None   Collection Time: 07/19/22  2:50 PM   Specimen: BLOOD  Result Value Ref Range Status   Specimen Description BLOOD SITE NOT SPECIFIED  Final   Special Requests   Final    BOTTLES DRAWN AEROBIC AND ANAEROBIC Blood Culture adequate volume   Culture   Final    NO GROWTH 5 DAYS Performed at [facility], [address] [place], [place] [address]    Report Status 07/24/2022 FINAL  Final  Blood Culture (Routine X 2)     Status: None   Collection Time: 07/19/22  3:00 PM   Specimen: BLOOD  Result Value Ref Range Status   Specimen Description BLOOD SITE NOT SPECIFIED  Final   Special Requests   Final    BOTTLES DRAWN AEROBIC AND ANAEROBIC Blood Culture results may not be optimal due to an inadequate volume of blood received in culture bottles   Culture   Final    NO GROWTH 5 DAYS Performed at [facility], [place] [address] [place], [place] [address]    Report Status 07/24/2022 FINAL  Final  Resp Panel by RT-PCR (Flu A&B, Covid) Anterior Nasal Swab     Status: None   Collection Time: 07/19/22  3:36 PM   Specimen: Anterior Nasal  Swab  Result Value Ref Range Status   SARS Coronavirus 2 by RT PCR NEGATIVE NEGATIVE Final    Comment: (NOTE) SARS-CoV-2 target nucleic acids are NOT DETECTED.  The SARS-CoV-2 RNA is generally detectable in upper respiratory specimens during the acute phase of infection. The lowest concentration of SARS-CoV-2 viral copies this assay can detect is 138 copies/mL. A negative result does not preclude SARS-Cov-2 infection and should not be used as the sole basis for treatment or other patient management decisions. A negative result may occur with  improper specimen collection/handling, submission of specimen other than nasopharyngeal swab, presence of viral mutation(s) within the areas targeted by this assay, and inadequate number of viral copies(<138 copies/mL). A negative result must be combined with clinical observations, patient history, and epidemiological information. The expected result is Negative.  Fact Sheet for Patients:  EntrepreneurPulse.com.au  Fact Sheet for Healthcare Providers:  IncredibleEmployment.be  This test is no t yet approved or cleared by the [place] FDA and  has been authorized for detection and/or diagnosis of SARS-CoV-2 by FDA under an Emergency Use Authorization (EUA). This EUA will remain  in effect (meaning this test can be used) for the duration of the COVID-19 declaration under  Section 564(b)(1) of the Act, 21 U.S.C.section 360bbb-3(b)(1), unless the authorization is terminated  or revoked sooner.       Influenza A by PCR NEGATIVE NEGATIVE Final   Influenza B by PCR NEGATIVE NEGATIVE Final    Comment: (NOTE) The Xpert Xpress SARS-CoV-2/FLU/RSV plus assay is intended as an aid in the diagnosis of influenza from Nasopharyngeal swab specimens and should not be used as a sole basis for treatment. Nasal washings and aspirates are unacceptable for Xpert Xpress SARS-CoV-2/FLU/RSV testing.  Fact Sheet for  Patients: EntrepreneurPulse.com.au  Fact Sheet for Healthcare Providers: IncredibleEmployment.be  This test is not yet approved or cleared by the [place] FDA and has been authorized for detection and/or diagnosis of SARS-CoV-2 by FDA under an Emergency Use Authorization (EUA). This EUA will remain in effect (meaning this test can be used) for the duration of the COVID-19 declaration under Section 564(b)(1) of the Act, 21 U.S.C. section 360bbb-3(b)(1), unless the authorization is terminated or revoked.  Performed at [facility], [place] [address] [place], [place] [address]   Respiratory (~20 pathogens) panel by PCR     Status: None   Collection Time: 07/19/22  3:36 PM   Specimen: Nasopharyngeal Swab; Respiratory  Result Value Ref Range Status   Adenovirus NOT DETECTED NOT DETECTED Final   Coronavirus 229E NOT DETECTED NOT DETECTED Final    Comment: (NOTE) The Coronavirus on the Respiratory Panel, DOES NOT test for the novel  Coronavirus (2019 nCoV)    Coronavirus HKU1 NOT DETECTED NOT DETECTED Final   Coronavirus NL63 NOT DETECTED NOT DETECTED Final   Coronavirus OC43 NOT DETECTED NOT DETECTED Final   Metapneumovirus NOT DETECTED NOT DETECTED Final   Rhinovirus / Enterovirus NOT DETECTED NOT DETECTED Final   Influenza A NOT DETECTED NOT DETECTED Final   Influenza B NOT DETECTED NOT DETECTED Final   Parainfluenza Virus 1 NOT DETECTED NOT DETECTED Final   Parainfluenza Virus 2 NOT DETECTED NOT DETECTED Final   Parainfluenza Virus 3 NOT DETECTED NOT DETECTED Final   Parainfluenza Virus 4 NOT DETECTED NOT DETECTED Final   Respiratory Syncytial Virus NOT DETECTED NOT DETECTED Final   Bordetella pertussis NOT DETECTED NOT DETECTED Final   Bordetella Parapertussis NOT DETECTED NOT DETECTED Final   Chlamydophila pneumoniae NOT DETECTED NOT DETECTED Final   Mycoplasma pneumoniae NOT DETECTED NOT DETECTED Final    Comment: Performed at  [facility], [place]. [address] [place], [place] [address]  MRSA Next Gen by PCR, Nasal     Status: None   Collection Time: 07/24/22 10:25 PM   Specimen: Nasal Mucosa; Nasal Swab  Result Value Ref Range Status   MRSA by PCR Next Gen NOT DETECTED NOT DETECTED Final    Comment: (NOTE) The GeneXpert MRSA Assay (FDA approved for NASAL specimens only), is one component of a comprehensive MRSA colonization surveillance program. It is not intended to diagnose MRSA infection nor to guide or monitor treatment for MRSA infections. Test performance is not FDA approved in patients less than 74 years old. Performed at [facility], [place] [address], [place], [place] [address]       Radiology Studies: No results found.    LOS: 7 days    Cordelia Poche, MD [facility] 07/27/2022, 6:45 PM   If 7PM-7AM, please contact night-coverage [email or web address]

## 2022-07-27 NOTE — Progress Notes (Signed)
Physical Therapy Treatment Patient Details Name: [name] MRN: [id number] DOB: 23-Sep-1935 Today's Date: 07/27/2022   History of Present Illness Pt is an 87 y/o male presenting with increased confusion. Imaging showed L BG infarct. PMH: CAD, DM, GERD, dementia, HTN, PAD , R TKA    PT Comments    Pt was seen for progression of bed to sit to stand partially on stedy with two person help and gait belt.  Pt is a good worker, but has difficulty pulling up through hips to extend and fully stand.  Recommend him to SNF as his deficits of moving are great, and will need a lot of time to restore LE strength, standing tolerance and to recover his motor planning skills to sequence to stand with help.  Focus on goals of PT in POC and work on OOB to chair as pt can tolerate.   Recommendations for follow up therapy are one component of a multi-disciplinary discharge planning process, led by the attending physician.  Recommendations may be updated based on patient status, additional functional criteria and insurance authorization.  Follow Up Recommendations  Skilled nursing-short term rehab (<3 hours/day) Can patient physically be transported by private vehicle: No   Assistance Recommended at Discharge Frequent or constant Supervision/Assistance  Patient can return home with the following Two people to help with walking and/or transfers;Two people to help with bathing/dressing/bathroom;Assistance with cooking/housework;Help with stairs or ramp for entrance;Assist for transportation   Equipment Recommendations  None recommended by PT    Recommendations for Other Services       Precautions / Restrictions Precautions Precautions: Fall Precaution Comments: posterior bias to attempt all movement as well as to R side Restrictions Weight Bearing Restrictions: No     Mobility  Bed Mobility Overal bed mobility: Needs Assistance Bed Mobility: Supine to Sit, Sit to Supine     Supine to sit: Max  assist, +2 for physical assistance, +2 for safety/equipment Sit to supine: Total assist, +2 for physical assistance, +2 for safety/equipment        Transfers Overall transfer level: Needs assistance Equipment used: Ambulation equipment used Transfers: Sit to/from Stand Sit to Stand: Max assist, +2 physical assistance, +2 safety/equipment           General transfer comment: pt is unable to fully extend to stand, some amount of hip flexion is involved to stand and pull forward on stedy    Ambulation/Gait               General Gait Details: unable   Stairs             Wheelchair Mobility    Modified Rankin (Stroke Patients Only)       Balance Overall balance assessment: Needs assistance   Sitting balance-Leahy Scale: Poor   Postural control: Posterior lean, Right lateral lean Standing balance support: Bilateral upper extremity supported, During functional activity Standing balance-Leahy Scale: Zero Standing balance comment: strong 2 person support on gait belt to pull to attempt standing                            Cognition Arousal/Alertness: Awake/alert Behavior During Therapy: WFL for tasks assessed/performed Overall Cognitive Status: History of cognitive impairments - at baseline                                 General Comments: dementia, requries step by step  cues for mobility with repetition        Exercises      General Comments General comments (skin integrity, edema, etc.): staff and family assisted to encourage and assist to stand      Pertinent Vitals/Pain Pain Assessment Pain Assessment: No/denies pain Negative Vocalization: none Facial Expression: smiling or inexpressive Body Language: relaxed    Home Living                          Prior Function            PT Goals (current goals can now be found in the care plan section) Acute Rehab PT Goals Patient Stated Goal: wife wishes pt to  get closer to going home Progress towards PT goals: Progressing toward goals    Frequency    Min 3X/week      PT Plan Current plan remains appropriate    Co-evaluation   Reason for Co-Treatment: For patient/therapist safety PT goals addressed during session: Mobility/safety with mobility;Proper use of DME;Balance OT goals addressed during session: ADL's and self-care      AM-PAC PT "6 Clicks" Mobility   Outcome Measure  Help needed turning from your back to your side while in a flat bed without using bedrails?: Total Help needed moving from lying on your back to sitting on the side of a flat bed without using bedrails?: Total Help needed moving to and from a bed to a chair (including a wheelchair)?: Total Help needed standing up from a chair using your arms (e.g., wheelchair or bedside chair)?: Total Help needed to walk in hospital room?: Total Help needed climbing 3-5 steps with a railing? : Total 6 Click Score: 6    End of Session Equipment Utilized During Treatment: Gait belt Activity Tolerance: Patient tolerated treatment well Patient left: in bed;with call bell/phone within reach;with family/visitor present;with bed alarm set Nurse Communication: Mobility status PT Visit Diagnosis: Unsteadiness on feet (R26.81);Muscle weakness (generalized) (M62.81);Difficulty in walking, not elsewhere classified (R26.2)     Time: 3614-4315 PT Time Calculation (min) (ACUTE ONLY): 23 min  Charges:  $Therapeutic Activity: 8-22 mins      Ramond Dial 07/27/2022, 3:33 PM  Mee Hives, PT PhD Acute Rehab Dept. Number: [place] and [place]

## 2022-07-27 NOTE — Progress Notes (Signed)
A line removed at 10:11AM Pressure held for 10 minutes and Pressure dressing applied This RN removed the dressing on the pt wrist with adhesive remover but a mall skin tear was caused. A small dressing was applied. Wife at bedside said this happens at home.

## 2022-07-27 NOTE — Progress Notes (Signed)
Occupational Therapy Treatment Patient Details Name: [name] MRN: [id number] DOB: [birth year] Today's Date: 07/27/2022   History of present illness Pt is an 87 y/o male presenting with increased confusion. Imaging showed L BG infarct. PMH: CAD, DM, GERD, dementia, HTN, PAD , R TKA   OT comments  Pt with incremental progress towards OT goals, remains limited by weakness and significant posterior lean in sitting/standing. Facilitated continued Stedy trials to break tendency to push back with overall Max-Total A x 2 needed for standing with pt difficulty sustaining without significant assist. Continue to rec SNF as DC per family request to improve transfer abilities and get back to RW use prior to illness.   Recommendations for follow up therapy are one component of a multi-disciplinary discharge planning process, led by the attending physician.  Recommendations may be updated based on patient status, additional functional criteria and insurance authorization.    Follow Up Recommendations  Skilled nursing-short term rehab (<3 hours/day)    Assistance Recommended at Discharge Frequent or constant Supervision/Assistance  Patient can return home with the following  Two people to help with walking and/or transfers;Two people to help with bathing/dressing/bathroom   Equipment Recommendations  None recommended by OT    Recommendations for Other Services      Precautions / Restrictions Precautions Precautions: Fall Restrictions Weight Bearing Restrictions: No       Mobility Bed Mobility Overal bed mobility: Needs Assistance Bed Mobility: Supine to Sit, Sit to Supine     Supine to sit: Max assist, +2 for physical assistance, +2 for safety/equipment, HOB elevated Sit to supine: Total assist, +2 for physical assistance, +2 for safety/equipment, HOB elevated   General bed mobility comments: Heavy max A x 2 to sit EOB - with multimodal cues, pt able to reach towards bedrail to assist but  heavy posterior lean. Total A x 2 to get back in bed    Transfers Overall transfer level: Needs assistance Equipment used: Ambulation equipment used Transfers: Sit to/from Stand Sit to Stand: Max assist, +2 physical assistance, +2 safety/equipment, From elevated surface, Total assist           General transfer comment: Varied Max A x 2 to Total A x 2 for up to 4 standing trials EOB with Stedy. Difficulty clearing hips, straighening knees and initiating. unable to get Stedy pads under pt     Balance Overall balance assessment: Needs assistance Sitting-balance support: No upper extremity supported, Feet supported Sitting balance-Leahy Scale: Poor Sitting balance - Comments: Mod-Max A for sitting balance with primarily posterior lean Postural control: Posterior lean, Right lateral lean Standing balance support: Bilateral upper extremity supported Standing balance-Leahy Scale: Zero                             ADL either performed or assessed with clinical judgement   ADL Overall ADL's : Needs assistance/impaired                                       General ADL Comments: emphasis on OOB progression, sitting balance to decrease fall risk with daily tasks    Extremity/Trunk Assessment Upper Extremity Assessment Upper Extremity Assessment: Generalized weakness   Lower Extremity Assessment Lower Extremity Assessment: Defer to PT evaluation        Vision   Vision Assessment?: No apparent visual deficits   Perception  Praxis      Cognition Arousal/Alertness: Awake/alert Behavior During Therapy: WFL for tasks assessed/performed Overall Cognitive Status: History of cognitive impairments - at baseline                                 General Comments: hx of dementia, follows one step directions fairly consistently. minimally verbal but does answer direct questions        Exercises      Shoulder Instructions        General Comments Wife and caregiver present, hands on to assist. Nursing and pharmacy also entering during session    Pertinent Vitals/ Pain       Pain Assessment Pain Assessment: No/denies pain  Home Living                                          Prior Functioning/Environment              Frequency  Min 2X/week        Progress Toward Goals  OT Goals(current goals can now be found in the care plan section)  Progress towards OT goals: Progressing toward goals  Acute Rehab OT Goals Patient Stated Goal: be able to stand with less assist, get back on RW OT Goal Formulation: With patient/family Time For Goal Achievement: 08/03/22 Potential to Achieve Goals: Fair ADL Goals Pt Will Perform Eating: with set-up;sitting Pt Will Perform Grooming: with supervision;sitting Additional ADL Goal #1: Pt to complete bed mobility with Mod A in prep for functional transfers Additional ADL Goal #2: Pt to complete sit to stand transfer with no more than Mod A x 2 in prep for ADL transfers  Plan Discharge plan remains appropriate    Co-evaluation    PT/OT/SLP Co-Evaluation/Treatment: Yes Reason for Co-Treatment: For patient/therapist safety;To address functional/ADL transfers   OT goals addressed during session: ADL's and self-care      AM-PAC OT "6 Clicks" Daily Activity     Outcome Measure   Help from another person eating meals?: A Lot Help from another person taking care of personal grooming?: A Little Help from another person toileting, which includes using toliet, bedpan, or urinal?: Total Help from another person bathing (including washing, rinsing, drying)?: A Lot Help from another person to put on and taking off regular upper body clothing?: A Lot Help from another person to put on and taking off regular lower body clothing?: Total 6 Click Score: 11    End of Session Equipment Utilized During Treatment: Gait belt  OT Visit Diagnosis: Other  abnormalities of gait and mobility (R26.89);Unsteadiness on feet (R26.81)   Activity Tolerance Patient tolerated treatment well   Patient Left in bed;with call bell/phone within reach;with bed alarm set;with family/visitor present   Nurse Communication Mobility status        Time: 2263-3354 OT Time Calculation (min): 32 min  Charges: OT General Charges $OT Visit: 1 Visit OT Treatments $Therapeutic Activity: 8-22 mins  Malachy Chamber, OTR/L Acute Rehab Services Office: [phone]   Layla Maw 07/27/2022, 2:01 PM

## 2022-07-27 NOTE — Progress Notes (Signed)
Pt BP dropped to 83/69. RN checked on pt. Pt had pulled the Dopamine IV. RN restarted IV. BP is currently 134/68. RN will continue to monitor pt per unit protocol.

## 2022-07-27 NOTE — TOC Progression Note (Signed)
Transition of Care [facility] - Progression Note    Patient Details  Name: [name] MRN: [id number] Date of Birth: [birth year]  Transition of Care [facility]) CM/SW [place],  Phone Number: 07/27/2022, 2:03 PM  Clinical Narrative:     Family remains agreeable to SNF once the patient is medically stable for discharge. Family is agreeable to [employer]) but will contact CSW to confirmed bed offer.   TOC will continue to follow and assist with discharge planning.  Thurmond Butts, MSW, LCSW Clinical Social Worker    Expected Discharge Plan: Skilled Nursing Facility Barriers to Discharge: Continued Medical Work up  Expected Discharge Plan and Services Expected Discharge Plan: [place] Choice: [place] arrangements for the past 2 months: Single Family Home                                       Social Determinants of Health (SDOH) Interventions    Readmission Risk Interventions     No data to display

## 2022-07-27 NOTE — Progress Notes (Addendum)
Speech Language Pathology Treatment: Dysphagia  Patient Details Name: [name] MRN: [id number] DOB: 10/07/1935 Today's Date: 07/27/2022 Time: 9980-6999 SLP Time Calculation (min) (ACUTE ONLY): 10 min  Assessment / Plan / Recommendation Clinical Impression  Pts wife at bedside. Wife reports that pt is tolerating nectar thick liquids well and reiterated understanding of precautions. She feels pt is coughing much less. She thinks pt might need thickened liquids into the future. SLP cautioned wife that that could be fine, but hopeful that if pts mobility and positioning improves he will return to baseline tolerance. Prolonged use of thickened liquids could reduce pts pleasure with drinks and result in dehydration and other complications. She was not aware that drinks like coffee could be thickened and SLP offered coffee to pt, but he refused. Encouraged wife to offer drinks of choice with thickener, which she has at bedside. No further acute needs at this time.   HPI HPI: [name] is a 87 y.o. male with medical history significant of CAD, T2DM, GERD, HLD, HTN, PAD, hx of CVA who presented to ED with change in mental status. At baseline he does talk, but very little. He walks around the house with walker. He needs full assist x 2 for showering. He needs help with dressing. He can feed himself. No falls in the last year.  He has not had any fever/chills. No open sores or bed sores. He has been coughing for 2 days that is dry,but at times is clear phlegm. No odor to urine, unsure of color change. He has been eating and drinking well. Pulmonary infiltratesBeing treated for possible pneumonia. MRI shows left basal ganglia CVA. Pt had MBS in July 20223 that showed esopahgeal stasis, flash penetration, oral holding.      SLP Plan  All goals met      Recommendations for follow up therapy are one component of a multi-disciplinary discharge planning process, led by the attending physician.  Recommendations  may be updated based on patient status, additional functional criteria and insurance authorization.    Recommendations  Diet recommendations: Dysphagia 3 (mechanical soft);Nectar-thick liquid Liquids provided via: Cup;Straw Medication Administration: Crushed with puree Supervision: Full supervision/cueing for compensatory strategies;Trained caregiver to feed patient Compensations: Small sips/bites;Slow rate;Follow solids with liquid Postural Changes and/or Swallow Maneuvers: Seated upright 90 degrees                Follow Up Recommendations: Skilled nursing-short term rehab (<3 hours/day) Plan: All goals met           Myosha Cuadras, Katherene Ponto  07/27/2022, 12:23 PM

## 2022-07-27 NOTE — Plan of Care (Signed)
  Problem: Education: Goal: Ability to describe self-care measures that may prevent or decrease complications (Diabetes Survival Skills Education) will improve Outcome: Progressing   Problem: Coping: Goal: Ability to adjust to condition or change in health will improve Outcome: Progressing   Problem: Fluid Volume: Goal: Ability to maintain a balanced intake and output will improve Outcome: Progressing   Problem: Health Behavior/Discharge Planning: Goal: Ability to manage health-related needs will improve Outcome: Progressing   Problem: Education: Goal: Knowledge of disease or condition will improve Outcome: Progressing Goal: Knowledge of secondary prevention will improve (SELECT ALL) Outcome: Progressing Goal: Knowledge of patient specific risk factors will improve (INDIVIDUALIZE FOR PATIENT) Outcome: Progressing

## 2022-07-27 NOTE — Progress Notes (Addendum)
  Progress Note    07/27/2022 7:30 AM 2 Days Post-Op  Subjective:  no complaints   Vitals:   07/27/22 0720 07/27/22 0725  BP: (!) 145/64 (!) 154/65  Pulse: 68 74  Resp:    Temp:    SpO2: 94% 95%   Physical Exam Lungs:  non labored Incisions:  L neck c/d/i Extremities:  moving ext well Neurologic: confused this morning but moving all extremities well and following commands  CBC    Component Value Date/Time   WBC 10.0 07/27/2022 0500   RBC 3.65 (L) 07/27/2022 0500   HGB 10.9 (L) 07/27/2022 0500   HCT 30.8 (L) 07/27/2022 0500   PLT 241 07/27/2022 0500   MCV 84.4 07/27/2022 0500   MCH 29.9 07/27/2022 0500   MCHC 35.4 07/27/2022 0500   RDW 13.2 07/27/2022 0500   LYMPHSABS 2.3 07/27/2022 0500   MONOABS 0.9 07/27/2022 0500   EOSABS 0.1 07/27/2022 0500   BASOSABS 0.0 07/27/2022 0500    BMET    Component Value Date/Time   NA 134 (L) 07/27/2022 0500   K 3.7 07/27/2022 0500   CL 104 07/27/2022 0500   CO2 22 07/27/2022 0500   GLUCOSE 127 (H) 07/27/2022 0500   BUN 23 07/27/2022 0500   CREATININE 0.99 07/27/2022 0500   CALCIUM 8.6 (L) 07/27/2022 0500   GFRNONAA >60 07/27/2022 0500   GFRAA >60 03/24/2015 0300    INR    Component Value Date/Time   INR 1.13 03/20/2015 1014     Intake/Output Summary (Last 24 hours) at 07/27/2022 0730 Last data filed at 07/27/2022 0400 Gross per 24 hour  Intake 50 ml  Output 1550 ml  Net -1500 ml     Assessment/Plan:  87 y.o. male is s/p L TCAR with shockwave lithotripsy 2 Days Post-Op   Neuro exam appears to be stable L neck incision is well appearing Overnight patient again experienced some hypotension but responded to dopamine; goal >676 systolic; home BP meds also held for soft pressures; continue to monitor  Continue aspirin and plavix    Dagoberto Ligas, PA-C [facility] [phone] 07/27/2022 7:30 AM  I have seen and evaluated the patient. I agree with the PA note as documented above.  Postop  day 2 status post left TCAR with lithotripsy for high-grade symptomatic stenosis.  Neck incision looks good.  He is at his neurologic baseline and moving all extremities.  I have asked nursing to remove his A-line as this has a blunted waveform.  We will follow cuff pressures.  Remains on 2 mcg of dopamine that should be able to be weaned off today.  Aspirin Plavix.  Has statin allergy.  Marty Heck, MD [facility] [place] Office: [phone]

## 2022-07-28 DIAGNOSIS — F0390 Unspecified dementia without behavioral disturbance: Secondary | ICD-10-CM | POA: Diagnosis not present

## 2022-07-28 DIAGNOSIS — N1831 Chronic kidney disease, stage 3a: Secondary | ICD-10-CM | POA: Diagnosis not present

## 2022-07-28 DIAGNOSIS — I10 Essential (primary) hypertension: Secondary | ICD-10-CM | POA: Diagnosis not present

## 2022-07-28 DIAGNOSIS — R579 Shock, unspecified: Secondary | ICD-10-CM | POA: Diagnosis not present

## 2022-07-28 DIAGNOSIS — G9340 Encephalopathy, unspecified: Secondary | ICD-10-CM | POA: Diagnosis not present

## 2022-07-28 LAB — CBC
HCT: 28.7 % — ABNORMAL LOW (ref 39.0–52.0)
Hemoglobin: 10 g/dL — ABNORMAL LOW (ref 13.0–17.0)
MCH: 29.9 pg (ref 26.0–34.0)
MCHC: 34.8 g/dL (ref 30.0–36.0)
MCV: 85.7 fL (ref 80.0–100.0)
Platelets: 224 10*3/uL (ref 150–400)
RBC: 3.35 MIL/uL — ABNORMAL LOW (ref 4.22–5.81)
RDW: 13.2 % (ref 11.5–15.5)
WBC: 9.4 10*3/uL (ref 4.0–10.5)
nRBC: 0 % (ref 0.0–0.2)

## 2022-07-28 LAB — GLUCOSE, CAPILLARY
Glucose-Capillary: 140 mg/dL — ABNORMAL HIGH (ref 70–99)
Glucose-Capillary: 138 mg/dL — ABNORMAL HIGH (ref 70–99)
Glucose-Capillary: 82 mg/dL (ref 70–99)
Glucose-Capillary: 113 mg/dL — ABNORMAL HIGH (ref 70–99)

## 2022-07-28 MED ORDER — BISACODYL 10 MG RE SUPP: 10.0000 mg | Freq: Every day | RECTAL | Status: DC | PRN

## 2022-07-28 MED ORDER — POLYETHYLENE GLYCOL 3350 17 G PO PACK
17.0000 g | PACK | Freq: Two times a day (BID) | ORAL | Status: DC
  Administered 2022-07-29: 17 g via ORAL
  Filled 2022-07-28 (×4): qty 1

## 2022-07-28 NOTE — TOC Progression Note (Signed)
Transition of Care [facility]) - Progression Note    Patient Details  Name: [name] MRN: [id number] Date of Birth: 04-13-1935  [facility] CM/SW Contact  Vinie Sill, [place] Phone Number: 07/28/2022, 1:27 PM  Clinical Narrative:     MD informed CSW- family was unhappy with the SNF they visit and was considering taking the patient home.   CSW spoke with the patient's spouse, she was undecided. She confirmed she was not happy with the SNF she visit but was unsure of [facility] vs SNF. CSW informed of other bed offers. She will review and follow up with CSW.   TOC will continue to follow and assist with discharge planning.    Thurmond Butts, MSW, LCSW Clinical Social Worker    Expected Discharge Plan: Skilled Nursing Facility Barriers to Discharge: Continued Medical Work up  Expected Discharge Plan and Services Expected Discharge Plan: [place] Choice: [place] arrangements for the past 2 months: Single Family Home                                       Social Determinants of Health (SDOH) Interventions    Readmission Risk Interventions     No data to display

## 2022-07-28 NOTE — Progress Notes (Signed)
PROGRESS NOTE    [name]  DTO:[id number] DOB: 1935-07-17 DOA: 07/19/2022 PCP: Ginger Organ., MD   Brief Narrative: [name] is a 87 y.o. male with a history of CAD, diabetes mellitus type 2, GERD, hyperlipidemia, PAD, CVA. Patient presented secondary to altered mental status and found to have an acute left basal ganglia infarct with associated severe left ICA stenosis s/p left TCAR and shockwave lithotripsy. Hospital complicated by refractory post operative shock.   Assessment and Plan:  Postoperative hypotension Shock Patient initially requiring dopamine immediately post-op. Midodrine started. Discussed with PCCM on 9/27 who recommended IV fluid bolus followed by maintenance IV fluids in addition to increase to midodrine 10 mg TID. Blood pressure improved and stable. Dopamine discontinued. -Continue midodrine  Acute left basal ganglia infarct MRI confirms left basal ganglia infarct. CTA head/neck results below. LDL of 104. Hemoglobin A1C of 6.5%. Transthoracic Echocardiogram significant for LVEF of 60-65%. Neurology recommendations for outpatient follow-up and antiplatelet therapy per vascular surgery. Patient is statin intolerant. PT/OT recommendations for SNF.   Severe intracranial/extraluminal stenosis CTA head/neck significant for left proximal M1 near occlusion in addition to 75% right ICA and 90% left ICA stenosis, left subclavian moderate stenosis proximal to left VA takeoff and bilateral ICA siphon severe atherosclerosis. Vascular surgery consulted and performed left TCAR with shock wave on 9/25. Vascular surgery recommending DAPT with aspirin and Plavix.  Hemoptysis Streaky. Likely secondary to recent intubation and dual antiplatelet therapy. Hemoglobin stable. -Recheck CBC today  Dysphagia Patient evaluated by speech therapy. Recommendation for dysphagia 2 diet and staff to assist with self feeding.  Acute metabolic encephalopathy Multifactorial in setting  of acute illness/delirium. Improved and back to baseline.  Acute anemia Baseline hemoglobin of about 13. Hemoglobin down to a low of 9.5 this admission and is stable. No evidence of bleeding. Likely related to acute perioperative blood loss.  Pulmonary infiltrates Concern for possible pneumonia; clinically undetermined. Patient started on Ceftriaxone and azithromycin and completed course.  Diabetes mellitus type 2 -Continue SSI  CAD History of CABG. Not on a statin secondary to intolerance. Patient is on Toprol XL and Plavix. Asymptomatic. Toprol XL held secondary to hypotension. -Continue Plavix and Zetia  CKD stage IIIa Stable.  Primary hypertension Patient is on ramipril and metoprolol which are now held secondary to refractory hypotension.  Dementia -Continue delirium precautions  GERD without esophagitis -Continue Protonix  Moderate/severe mitral valve regurgitation Noted. Outpatient follow-up.   DVT prophylaxis: Lovenox Code Status:   Code Status: Full Code Family Communication: Wife and caregiver at bedside Disposition Plan: Discharge to SNF pending improvement in hypotension and specialist recommendations for discharge readiness.   Consultants:  Neurology Vascular surgery PCCM  Procedures:  Transcarotid Artery Revascularization W/ SHOCK WAVE (Left: Neck) Transthoracic Echocardiogram  Antimicrobials: Ceftriaxone Azithromycin Cefazolin    Subjective: Patient reports no concerns today. No issues overnight. No pain.  Objective: BP 111/66   Pulse (!) 49   Temp 98.8 F (37.1 C) (Oral)   Resp 17   Ht '6\' 2"'$  (1.88 m)   Wt 79.4 kg   SpO2 98%   BMI 22.47 kg/m   Examination:  General exam: Appears calm and comfortable Respiratory system: Clear to auscultation. Respiratory effort normal. Cardiovascular system: S1 & S2 heard, RRR. No murmurs, rubs, gallops or clicks. Gastrointestinal system: Abdomen is nondistended, soft and nontender. No organomegaly  or masses felt. Normal bowel sounds heard. Central nervous system: Alert. No focal neurological deficits. Musculoskeletal: No edema. No calf tenderness  Skin: No cyanosis. No rashes    Data Reviewed: I have personally reviewed following labs and imaging studies  CBC Lab Results  Component Value Date   WBC 10.0 07/27/2022   RBC 3.65 (L) 07/27/2022   HGB 10.9 (L) 07/27/2022   HCT 30.8 (L) 07/27/2022   MCV 84.4 07/27/2022   MCH 29.9 07/27/2022   PLT 241 07/27/2022   MCHC 35.4 07/27/2022   RDW 13.2 07/27/2022   LYMPHSABS 2.3 07/27/2022   MONOABS 0.9 07/27/2022   EOSABS 0.1 07/27/2022   BASOSABS 0.0 88/41/6606     Last metabolic panel Lab Results  Component Value Date   NA 134 (L) 07/27/2022   K 3.7 07/27/2022   CL 104 07/27/2022   CO2 22 07/27/2022   BUN 23 07/27/2022   CREATININE 0.99 07/27/2022   GLUCOSE 127 (H) 07/27/2022   GFRNONAA >60 07/27/2022   GFRAA >60 03/24/2015   CALCIUM 8.6 (L) 07/27/2022   PHOS 2.1 (L) 07/27/2022   PROT 5.8 (L) 07/27/2022   ALBUMIN 3.1 (L) 07/27/2022   BILITOT 0.6 07/27/2022   ALKPHOS 56 07/27/2022   AST 26 07/27/2022   ALT 10 07/27/2022   ANIONGAP 8 07/27/2022    GFR: Estimated Creatinine Clearance: 61.3 mL/min (by C-G formula based on SCr of 0.99 mg/dL).  Recent Results (from the past 240 hour(s))  Blood Culture (Routine X 2)     Status: None   Collection Time: 07/19/22  2:50 PM   Specimen: BLOOD  Result Value Ref Range Status   Specimen Description BLOOD SITE NOT SPECIFIED  Final   Special Requests   Final    BOTTLES DRAWN AEROBIC AND ANAEROBIC Blood Culture adequate volume   Culture   Final    NO GROWTH 5 DAYS Performed at [facility], [address], [place], [place] [address]    Report Status 07/24/2022 FINAL  Final  Blood Culture (Routine X 2)     Status: None   Collection Time: 07/19/22  3:00 PM   Specimen: BLOOD  Result Value Ref Range Status   Specimen Description BLOOD SITE NOT SPECIFIED  Final    Special Requests   Final    BOTTLES DRAWN AEROBIC AND ANAEROBIC Blood Culture results may not be optimal due to an inadequate volume of blood received in culture bottles   Culture   Final    NO GROWTH 5 DAYS Performed at [facility], [place] [address], [place], [place] [address]    Report Status 07/24/2022 FINAL  Final  Resp Panel by RT-PCR (Flu A&B, Covid) Anterior Nasal Swab     Status: None   Collection Time: 07/19/22  3:36 PM   Specimen: Anterior Nasal Swab  Result Value Ref Range Status   SARS Coronavirus 2 by RT PCR NEGATIVE NEGATIVE Final    Comment: (NOTE) SARS-CoV-2 target nucleic acids are NOT DETECTED.  The SARS-CoV-2 RNA is generally detectable in upper respiratory specimens during the acute phase of infection. The lowest concentration of SARS-CoV-2 viral copies this assay can detect is 138 copies/mL. A negative result does not preclude SARS-Cov-2 infection and should not be used as the sole basis for treatment or other patient management decisions. A negative result may occur with  improper specimen collection/handling, submission of specimen other than nasopharyngeal swab, presence of viral mutation(s) within the areas targeted by this assay, and inadequate number of viral copies(<138 copies/mL). A negative result must be combined with clinical observations, patient history, and epidemiological information. The expected result  is Negative.  Fact Sheet for Patients:  EntrepreneurPulse.com.au  Fact Sheet for Healthcare Providers:  IncredibleEmployment.be  This test is no t yet approved or cleared by the [place] FDA and  has been authorized for detection and/or diagnosis of SARS-CoV-2 by FDA under an Emergency Use Authorization (EUA). This EUA will remain  in effect (meaning this test can be used) for the duration of the COVID-19 declaration under Section 564(b)(1) of the Act, 21 U.S.C.section 360bbb-3(b)(1), unless the  authorization is terminated  or revoked sooner.       Influenza A by PCR NEGATIVE NEGATIVE Final   Influenza B by PCR NEGATIVE NEGATIVE Final    Comment: (NOTE) The Xpert Xpress SARS-CoV-2/FLU/RSV plus assay is intended as an aid in the diagnosis of influenza from Nasopharyngeal swab specimens and should not be used as a sole basis for treatment. Nasal washings and aspirates are unacceptable for Xpert Xpress SARS-CoV-2/FLU/RSV testing.  Fact Sheet for Patients: EntrepreneurPulse.com.au  Fact Sheet for Healthcare Providers: IncredibleEmployment.be  This test is not yet approved or cleared by the [place] FDA and has been authorized for detection and/or diagnosis of SARS-CoV-2 by FDA under an Emergency Use Authorization (EUA). This EUA will remain in effect (meaning this test can be used) for the duration of the COVID-19 declaration under Section 564(b)(1) of the Act, 21 U.S.C. section 360bbb-3(b)(1), unless the authorization is terminated or revoked.  Performed at [place] [facility], [place] [address] [place], [place] [address]   Respiratory (~20 pathogens) panel by PCR     Status: None   Collection Time: 07/19/22  3:36 PM   Specimen: Nasopharyngeal Swab; Respiratory  Result Value Ref Range Status   Adenovirus NOT DETECTED NOT DETECTED Final   Coronavirus 229E NOT DETECTED NOT DETECTED Final    Comment: (NOTE) The Coronavirus on the Respiratory Panel, DOES NOT test for the novel  Coronavirus (2019 nCoV)    Coronavirus HKU1 NOT DETECTED NOT DETECTED Final   Coronavirus NL63 NOT DETECTED NOT DETECTED Final   Coronavirus OC43 NOT DETECTED NOT DETECTED Final   Metapneumovirus NOT DETECTED NOT DETECTED Final   Rhinovirus / Enterovirus NOT DETECTED NOT DETECTED Final   Influenza A NOT DETECTED NOT DETECTED Final   Influenza B NOT DETECTED NOT DETECTED Final   Parainfluenza Virus 1 NOT DETECTED NOT DETECTED Final   Parainfluenza Virus 2  NOT DETECTED NOT DETECTED Final   Parainfluenza Virus 3 NOT DETECTED NOT DETECTED Final   Parainfluenza Virus 4 NOT DETECTED NOT DETECTED Final   Respiratory Syncytial Virus NOT DETECTED NOT DETECTED Final   Bordetella pertussis NOT DETECTED NOT DETECTED Final   Bordetella Parapertussis NOT DETECTED NOT DETECTED Final   Chlamydophila pneumoniae NOT DETECTED NOT DETECTED Final   Mycoplasma pneumoniae NOT DETECTED NOT DETECTED Final    Comment: Performed at [facility], [place]. [address] [place],  [address]  MRSA Next Gen by PCR, Nasal     Status: None   Collection Time: 07/24/22 10:25 PM   Specimen: Nasal Mucosa; Nasal Swab  Result Value Ref Range Status   MRSA by PCR Next Gen NOT DETECTED NOT DETECTED Final    Comment: (NOTE) The GeneXpert MRSA Assay (FDA approved for NASAL specimens only), is one component of a comprehensive MRSA colonization surveillance program. It is not intended to diagnose MRSA infection nor to guide or monitor treatment for MRSA infections. Test performance is not FDA approved in patients less than 10 years old. Performed at [facility],  [address] [place], [place] [address]       Radiology Studies: No results found.    LOS: 8 days    Cordelia Poche, MD [facility] 07/28/2022, 9:38 AM   If 7PM-7AM, please contact night-coverage [email or web address]

## 2022-07-28 NOTE — Final Consult Note (Addendum)
NAME:  JEDAIAH RATHBUN, MRN:  [id number], DOB:  [birth year], LOS: 8 ADMISSION DATE:  07/19/2022, CONSULTATION DATE:  07/27/22 REFERRING MD:  TRH, CHIEF COMPLAINT:  Hypotension   History of Present Illness:  [name] is a 87 y.o. male with medical history significant of CAD, T2DM, GERD, HLD, HTN, PAD, hx of CVA who presented to ED with change in mental status found to have CVA noted to have L carotid stenosis and now is s/p vascular intervention.  Currently on dopamine and sudafed per vascular for hypotension post op. Pt became hypotensive when dopamine was discontinued and PCCM was consulted to assist with hypotension.  Pertinent  Medical History  CAD, T2DM, GERD, HLD, HTN, PAD, hx of CVA, Hx of dementia Significant Hospital Events: Including procedures, antibiotic start and stop dates in addition to other pertinent events    Interim History / Subjective:  Denies any acute concerns. Slightly more sleepy this am. Family at bedside Review of Systems:   Negative unless stated in the HPI.  Objective   Blood pressure 111/66, pulse (!) 49, temperature 98.8 F (37.1 C), temperature source Oral, resp. rate 17, height '6\' 2"'$  (1.88 m), weight 79.4 kg, SpO2 98 %.        Intake/Output Summary (Last 24 hours) at 07/28/2022 0950 Last data filed at 07/28/2022 0631 Gross per 24 hour  Intake 1948.45 ml  Output 650 ml  Net 1298.45 ml    Filed Weights   07/19/22 1753  Weight: 79.4 kg   Examination: General: NAD, laying comfortably in bed.  HENT: MMM Lungs: CTAB Cardiovascular: IRIR Abdomen: No TTP Extremities: No asymmetry Neuro: Alert and oriented x2 GU: suction catheter present  Assessment & Plan:  Refractory Hypotension Hypotension resolved. Would recommend continuing 50 cc/hr of D5-1/2NS until pt increases PO intake. Continue midodrine to 10 mg TID. This can be tapered off and discontinued at follow up outpatient visit. Given pt a fib, okay to give metoprolol succinate 12.5 mg, if HR  elevated but hold if HR<60. Hold other home antihypertensives. PCCM will sign off but available if need arises.   Basal Ganglia Infarction: Continue plavix, aspirin, and continue management per primary team and neurology T2DM-SSI HTN/CKD-As above HLD/CAD-Continue plavix Dementia-Delirium precautions GERD-Continue PPI Other conditions per primary team.   Best Practice (right click and "Reselect all SmartList Selections" daily)  Diet/type: dysphagia diet (see orders) DVT prophylaxis: LMWH GI prophylaxis: PPI Lines: N/A Foley:  N/A Continuous: None Code Status:  full code  Home Medications  Prior to Admission medications   Medication Sig Start Date End Date Taking? Authorizing Provider  acetaminophen (TYLENOL) 500 MG tablet Take 500 mg by mouth every 6 (six) hours as needed for moderate pain.   Yes [provider]  cholecalciferol (VITAMIN D3) 25 MCG (1000 UNIT) tablet Take 1,000 Units by mouth daily.   Yes [provider]  clopidogrel (PLAVIX) 75 MG tablet Take 75 mg by mouth daily with supper.   Yes [provider]  magnesium oxide (MAG-OX) 400 (240 Mg) MG tablet Take 1 tablet (400 mg total) by mouth daily. 07/14/21  Yes Eulogio Bear U, DO  metFORMIN (GLUCOPHAGE) 1000 MG tablet Take 1 tablet (1,000 mg total) by mouth daily with supper. Patient taking differently: Take 1,500 mg by mouth daily with supper. 07/13/21  Yes Eulogio Bear U, DO  metoprolol succinate (TOPROL-XL) 25 MG 24 hr tablet Take 25 mg by mouth daily with supper.   Yes [provider]  pantoprazole (PROTONIX) 40 MG  tablet Take 40 mg by mouth daily before supper. 04/14/20  Yes [provider]  ramipril (ALTACE) 5 MG capsule Take 5 mg by mouth daily. 09/10/21  Yes [provider]  glucose blood (ONETOUCH VERIO) test strip Check sugars daily Dx: E11.49 09/11/18   [provider]  Misc. Devices [facility]) MISC Dx: Muscle Weakness 11/27/19   [provider]  OneTouch Delica Lancets 81W MISC Pt to check BS daily  Dx: E11.49 02/24/20   [provider]  polyethylene glycol (MIRALAX / GLYCOLAX) 17 g packet Take 17 g by mouth daily as needed for mild constipation. Patient not taking: Reported on 07/19/2022 07/13/21   Geradine Girt, DO    Idamae Schuller, MD [facility] R[facility]. [facility] Internal Medicine Residency, PGY-2

## 2022-07-28 NOTE — Hospital Course (Signed)
[name] is a 87 y.o. male with a history of CAD, diabetes mellitus type 2, GERD, hyperlipidemia, PAD, CVA. Patient presented secondary to altered mental status and found to have an acute left basal ganglia infarct with associated severe left ICA stenosis s/p left TCAR and shockwave lithotripsy. Hospital complicated by refractory post operative shock.

## 2022-07-28 NOTE — Progress Notes (Addendum)
  Progress Note    07/28/2022 3:32 PM 3 Days Post-Op  Subjective:  no complaints   Vitals:   07/28/22 1115 07/28/22 1300  BP: (!) 120/48 (!) 130/52  Pulse: 88 71  Resp: 18 20  Temp: 99.6 F (37.6 C)   SpO2: 94% 97%   Physical Exam: Lungs:  non labored Incisions:  L neck incision c/d/i Extremities:  moving all extremities well Neurologic: Moving all extremities and following commands; symmetrical grip strength  CBC    Component Value Date/Time   WBC 9.4 07/28/2022 0929   RBC 3.35 (L) 07/28/2022 0929   HGB 10.0 (L) 07/28/2022 0929   HCT 28.7 (L) 07/28/2022 0929   PLT 224 07/28/2022 0929   MCV 85.7 07/28/2022 0929   MCH 29.9 07/28/2022 0929   MCHC 34.8 07/28/2022 0929   RDW 13.2 07/28/2022 0929   LYMPHSABS 2.3 07/27/2022 0500   MONOABS 0.9 07/27/2022 0500   EOSABS 0.1 07/27/2022 0500   BASOSABS 0.0 07/27/2022 0500    BMET    Component Value Date/Time   NA 134 (L) 07/27/2022 0500   K 3.7 07/27/2022 0500   CL 104 07/27/2022 0500   CO2 22 07/27/2022 0500   GLUCOSE 127 (H) 07/27/2022 0500   BUN 23 07/27/2022 0500   CREATININE 0.99 07/27/2022 0500   CALCIUM 8.6 (L) 07/27/2022 0500   GFRNONAA >60 07/27/2022 0500   GFRAA >60 03/24/2015 0300    INR    Component Value Date/Time   INR 1.13 03/20/2015 1014     Intake/Output Summary (Last 24 hours) at 07/28/2022 1532 Last data filed at 07/28/2022 1525 Gross per 24 hour  Intake 2536.78 ml  Output 650 ml  Net 1886.78 ml     Assessment/Plan:  87 y.o. male is s/p left TCAR with shockwave lithotripsy 3 Days Post-Op   Neuro exam stable Left neck incision healing well As of this morning the patient has been weaned off of dopamine; appreciate help from PCCM Continue aspirin and Plavix    Dagoberto Ligas, PA-C [facility] [phone] 07/28/2022 3:32 PM  I have seen and evaluated the patient. I agree with the PA note as documented above.  Postop day 3 status post left TCAR for  symptomatic high-grade carotid stenosis.  Weaned off dopamine.  Grossly neurologically intact.  On midodrine now.  Looks as alert as I've seen him.  Again discussed aspirin Plavix and follow-up duplex in 1 month.  Marty Heck, MD [facility] [place] Office: [phone]

## 2022-07-29 DIAGNOSIS — N1831 Chronic kidney disease, stage 3a: Secondary | ICD-10-CM | POA: Diagnosis not present

## 2022-07-29 DIAGNOSIS — G9340 Encephalopathy, unspecified: Secondary | ICD-10-CM | POA: Diagnosis not present

## 2022-07-29 DIAGNOSIS — F0390 Unspecified dementia without behavioral disturbance: Secondary | ICD-10-CM | POA: Diagnosis not present

## 2022-07-29 DIAGNOSIS — I10 Essential (primary) hypertension: Secondary | ICD-10-CM | POA: Diagnosis not present

## 2022-07-29 LAB — GLUCOSE, CAPILLARY
Glucose-Capillary: 131 mg/dL — ABNORMAL HIGH (ref 70–99)
Glucose-Capillary: 207 mg/dL — ABNORMAL HIGH (ref 70–99)
Glucose-Capillary: 133 mg/dL — ABNORMAL HIGH (ref 70–99)
Glucose-Capillary: 123 mg/dL — ABNORMAL HIGH (ref 70–99)
Glucose-Capillary: 103 mg/dL — ABNORMAL HIGH (ref 70–99)

## 2022-07-29 MED ORDER — MIDODRINE HCL 5 MG PO TABS
5.0000 mg | ORAL_TABLET | Freq: Three times a day (TID) | ORAL | Status: DC
  Administered 2022-07-29 – 2022-07-30 (×4): 5 mg via ORAL
  Filled 2022-07-29 (×4): qty 1

## 2022-07-29 MED ORDER — FLEET ENEMA 7-19 GM/118ML RE ENEM
1.0000 | ENEMA | Freq: Once | RECTAL | Status: AC
  Administered 2022-07-29: 1 via RECTAL
  Filled 2022-07-29: qty 1

## 2022-07-29 NOTE — TOC Progression Note (Addendum)
Transition of Care (TOC) - Progression Note  Stephen Gibbons RN, BSN Transitions of Care Unit 4E- RN Case Manager See Treatment Team for direct phone #    Patient Details  Name: [name] MRN: [id number] Date of Birth: 12/24/1934  Transition of Care [name]  [name], Stephen Rabon, RN Phone Number: 07/29/2022, 4:24 PM  Clinical Narrative:    Notified by CSW this am that wife has decided to take pt home and not proceed with placement in SNF.  CM  reached out to wife to confirm and discuss home needs for transition home when medically stable.   TC placed to wife. Per discussion- pt has needed DME- including hospital bed and hoyer lift. Wife also has 2 caregivers that assist her and she plans to increase hours. Pt also had [place] therapy with agency that wife found after pt left [facility] (ALF). Agency is [facility]- contact is [name]- [phone], fax- [phone] Wife states she would prefer to stay with [facility] if possible for therapy needs as the staff are very familiar with pt and he with them.  Discussed therapy needs PT/OT/SLP, pt has no open wounds-wife able to check BP at home- no nursing needs noted.   Pt will need EMS transport home, address confirmed with wife as well as PCP.   Call made to [facility]- confirmed that he can provide needed therapy- PT/OT/SLP- he does not offer nursing services- but confirms that his staff can provide vital/BP monitoring on their visits.  [place] orders have been faxed to [facility]- at [phone]   Expected Discharge Plan: [place] Barriers to Discharge: Continued Medical Work up  Expected Discharge Plan and Services Expected Discharge Plan: [place] In-house Referral: Clinical Social Work Discharge Planning Services: [place] Acute Care Choice: [place] arrangements for the past 2 months: Single Family Home                 DME Arranged: N/A DME Agency: NA       HH  Arranged: PT, OT, Speech Therapy [place] Agency: Other - See comment ([facility]- [name]) Date [place]: 07/29/22 Time [place]: 1530 Representative spoke with at [place]: [name] [phone])   Social Determinants of Health (SDOH) Interventions    Readmission Risk Interventions     No data to display

## 2022-07-29 NOTE — Progress Notes (Signed)
Physical Therapy Treatment Patient Details Name: [name] MRN: [id number] DOB: [birth year] Today's Date: 07/29/2022   History of Present Illness Pt is an 87 y/o male presenting with increased confusion. Imaging showed L BG infarct. PMH: CAD, DM, GERD, dementia, HTN, PAD , R TKA    PT Comments    Pt received supine and willing to attempt mobility with session focused on sitting balance for increased activity tolerance. Pt needing max-total assist of 2 to maintain sitting EOB as pt continues to demonstrate very strong posterior lean and active retropulsion during initiation to sitting or when asked to move UEs this session with several posterior LOB. Pt unable to maintain sitting with +1 assist to progress place steady and for standing trials in stedy this session. Pt continues to benefit from skilled PT services to progress toward functional mobility goals.    Recommendations for follow up therapy are one component of a multi-disciplinary discharge planning process, led by the attending physician.  Recommendations may be updated based on patient status, additional functional criteria and insurance authorization.  Follow Up Recommendations  Skilled nursing-short term rehab (<3 hours/day) Can patient physically be transported by private vehicle: No   Assistance Recommended at Discharge Frequent or constant Supervision/Assistance  Patient can return home with the following Two people to help with walking and/or transfers;Two people to help with bathing/dressing/bathroom;Assistance with cooking/housework;Help with stairs or ramp for entrance;Assist for transportation   Equipment Recommendations  None recommended by PT    Recommendations for Other Services       Precautions / Restrictions Precautions Precautions: Fall Precaution Comments: posterior bias to attempt all movement as well as to R side Restrictions Weight Bearing Restrictions: No     Mobility  Bed Mobility Overal bed  mobility: Needs Assistance Bed Mobility: Supine to Sit, Sit to Supine     Supine to sit: Max assist, +2 for physical assistance, +2 for safety/equipment, Total assist Sit to supine: Total assist, +2 for physical assistance, +2 for safety/equipment   General bed mobility comments: Heavy max A x 2 to sit EOB - with multimodal cues, pt able to reach towards bedrail to assist but heavy posterior lean. Total A x 2 to get back in bed. Pt with strong retro push throughout session, with inability to correct without total assist    Transfers Overall transfer level: Needs assistance                 General transfer comment: unable to maintain sitting to progress to stand this session    Ambulation/Gait               General Gait Details: unable   Stairs             Wheelchair Mobility    Modified Rankin (Stroke Patients Only)       Balance Overall balance assessment: Needs assistance Sitting-balance support: No upper extremity supported, Feet supported Sitting balance-Leahy Scale: Zero Sitting balance - Comments: Mod-Max A for sitting balance with primarily posterior lean Postural control: Posterior lean, Right lateral lean Standing balance support: Bilateral upper extremity supported, During functional activity Standing balance-Leahy Scale: Zero Standing balance comment: strong 2 person support on gait belt to pull to attempt standing                            Cognition Arousal/Alertness: Awake/alert Behavior During Therapy: WFL for tasks assessed/performed Overall Cognitive Status: History of cognitive impairments - at baseline  General Comments: dementia, requries step by step cues for mobility with repetition        Exercises      General Comments        Pertinent Vitals/Pain Pain Assessment Pain Assessment: No/denies pain    Home Living                          Prior  Function            PT Goals (current goals can now be found in the care plan section) Acute Rehab PT Goals PT Goal Formulation: With patient Time For Goal Achievement: 08/03/22    Frequency    Min 3X/week      PT Plan Current plan remains appropriate    Co-evaluation              AM-PAC PT "6 Clicks" Mobility   Outcome Measure  Help needed turning from your back to your side while in a flat bed without using bedrails?: Total Help needed moving from lying on your back to sitting on the side of a flat bed without using bedrails?: Total Help needed moving to and from a bed to a chair (including a wheelchair)?: Total Help needed standing up from a chair using your arms (e.g., wheelchair or bedside chair)?: Total Help needed to walk in hospital room?: Total Help needed climbing 3-5 steps with a railing? : Total 6 Click Score: 6    End of Session Equipment Utilized During Treatment: Gait belt Activity Tolerance: Patient tolerated treatment well Patient left: in bed;with call bell/phone within reach;with nursing/sitter in room Nurse Communication: Mobility status PT Visit Diagnosis: Unsteadiness on feet (R26.81);Muscle weakness (generalized) (M62.81);Difficulty in walking, not elsewhere classified (R26.2)     Time: 1541-1600 PT Time Calculation (min) (ACUTE ONLY): 19 min  Charges:  $Therapeutic Activity: 8-22 mins                     Jocelyn Lowery R. PTA Acute Rehabilitation Services Office: [place] 07/29/2022, 4:25 PM

## 2022-07-29 NOTE — Progress Notes (Addendum)
  Progress Note    07/29/2022 7:47 AM 4 Days Post-Op  Subjective:  resting comfortably in bed, no complaints   Vitals:   07/29/22 0346 07/29/22 0513  BP: 135/70 135/70  Pulse: 81 81  Resp: 18 18  Temp: 98.6 F (37 C) 98.6 F (37 C)  SpO2: 98% 98%   Physical Exam: Cardiac:  regular Lungs:  non labored Incisions:  left neck incision is c/d/I  Extremities:  moving extremities without deficits Neurologic: alert, follows commands, speech coherent, smile symmetric  CBC    Component Value Date/Time   WBC 9.4 07/28/2022 0929   RBC 3.35 (L) 07/28/2022 0929   HGB 10.0 (L) 07/28/2022 0929   HCT 28.7 (L) 07/28/2022 0929   PLT 224 07/28/2022 0929   MCV 85.7 07/28/2022 0929   MCH 29.9 07/28/2022 0929   MCHC 34.8 07/28/2022 0929   RDW 13.2 07/28/2022 0929   LYMPHSABS 2.3 07/27/2022 0500   MONOABS 0.9 07/27/2022 0500   EOSABS 0.1 07/27/2022 0500   BASOSABS 0.0 07/27/2022 0500    BMET    Component Value Date/Time   NA 134 (L) 07/27/2022 0500   K 3.7 07/27/2022 0500   CL 104 07/27/2022 0500   CO2 22 07/27/2022 0500   GLUCOSE 127 (H) 07/27/2022 0500   BUN 23 07/27/2022 0500   CREATININE 0.99 07/27/2022 0500   CALCIUM 8.6 (L) 07/27/2022 0500   GFRNONAA >60 07/27/2022 0500   GFRAA >60 03/24/2015 0300    INR    Component Value Date/Time   INR 1.13 03/20/2015 1014     Intake/Output Summary (Last 24 hours) at 07/29/2022 0747 Last data filed at 07/28/2022 2017 Gross per 24 hour  Intake 831.66 ml  Output --  Net 831.66 ml     Assessment/Plan:  87 y.o. male is s/p Left TCAR 4 Days Post-Op   Neurologically stable Left neck incision is well appearing On midodrine Continue Aspirin and plavix Follow up arranged in 1 month with Dr. Tracey Harries, PA-C [facility] [phone] 07/29/2022 7:47 AM  I have seen and evaluated the patient. I agree with the PA note as documented above.  Postop day 4 status post left TCAR with lithotripsy  for high-grade symptomatic stenosis.  Much more awake the last couple days.  Off dopamine.  Grossly neurologically intact and at his baseline.  Left neck incision looks good.  Aspirin Plavix at discharge and has statin allergy.  Discharged to SNF.  Has follow-up with [place] in a month for carotid duplex.  Marty Heck, MD [facility] [place] Office: [phone]

## 2022-07-29 NOTE — Progress Notes (Signed)
PROGRESS NOTE    [name]  NFA:[id number] DOB: Oct 05, 1935 DOA: 07/19/2022 PCP: Ginger Organ., MD   Brief Narrative: [name] is a 87 y.o. male with a history of CAD, diabetes mellitus type 2, GERD, hyperlipidemia, PAD, CVA. Patient presented secondary to altered mental status and found to have an acute left basal ganglia infarct with associated severe left ICA stenosis s/p left TCAR and shockwave lithotripsy. Hospital complicated by refractory post operative shock.   Assessment and Plan:  Postoperative hypotension Shock Patient initially requiring dopamine immediately post-op. Midodrine started. Discussed with PCCM on 9/27 who recommended IV fluid bolus followed by maintenance IV fluids in addition to increase to midodrine 10 mg TID. Blood pressure improved and stable. Dopamine discontinued. -Continue midodrine  Acute left basal ganglia infarct MRI confirms left basal ganglia infarct. CTA head/neck results below. LDL of 104. Hemoglobin A1C of 6.5%. Transthoracic Echocardiogram significant for LVEF of 60-65%. Neurology recommendations for outpatient follow-up and antiplatelet therapy per vascular surgery. Patient is statin intolerant. PT/OT recommendations for SNF.   Severe intracranial/extraluminal stenosis CTA head/neck significant for left proximal M1 near occlusion in addition to 75% right ICA and 90% left ICA stenosis, left subclavian moderate stenosis proximal to left VA takeoff and bilateral ICA siphon severe atherosclerosis. Vascular surgery consulted and performed left TCAR with shock wave on 9/25. Vascular surgery recommending DAPT with aspirin and Plavix.  Hemoptysis Streaky. Likely secondary to recent intubation and dual antiplatelet therapy. Improved. Hemoglobin stable.  Dysphagia Patient evaluated by speech therapy. Recommendation for dysphagia 2 diet and staff to assist with self feeding.  Acute metabolic encephalopathy Multifactorial in setting of acute  illness/delirium. Improved and back to baseline.  Acute anemia Baseline hemoglobin of about 13. Hemoglobin down to a low of 9.5 this admission and is stable. No evidence of bleeding. Likely related to acute perioperative blood loss.  Pulmonary infiltrates Concern for possible pneumonia; clinically undetermined. Patient started on Ceftriaxone and azithromycin and completed course.  Diabetes mellitus type 2 -Continue SSI  CAD History of CABG. Not on a statin secondary to intolerance. Patient is on Toprol XL and Plavix. Asymptomatic. Toprol XL held secondary to hypotension. -Continue Plavix and Zetia  CKD stage IIIa Stable.  Primary hypertension Patient is on ramipril and metoprolol which are now held secondary to refractory hypotension.  Dementia -Continue delirium precautions  GERD without esophagitis -Continue Protonix  Moderate/severe mitral valve regurgitation Noted. Outpatient follow-up.   DVT prophylaxis: Lovenox Code Status:   Code Status: Full Code Family Communication: Wife and caregiver at bedside Disposition Plan: Discharge home likely in 24 hours if blood pressure remains stable.   Consultants:  Neurology Vascular surgery PCCM  Procedures:  Transcarotid Artery Revascularization W/ SHOCK WAVE (Left: Neck) Transthoracic Echocardiogram  Antimicrobials: Ceftriaxone Azithromycin Cefazolin    Subjective: Some hiccups which are intermittent and are a chronic issue.  Objective: BP (!) 149/83 (BP Location: Right Arm)   Pulse 79   Temp 98.6 F (37 C) (Oral)   Resp 18   Ht '6\' 2"'$  (1.88 m)   Wt 79.4 kg   SpO2 92%   BMI 22.47 kg/m   Examination:  General exam: Appears calm and comfortable Respiratory system: Clear to auscultation. Respiratory effort normal. Cardiovascular system: S1 & S2 heard, RRR. Gastrointestinal system: Abdomen is nondistended, soft and nontender. Central nervous system: Alert. No focal neurological deficits. Musculoskeletal:  No edema. No calf tenderness Skin: No cyanosis. No rashes   Data Reviewed: I have personally reviewed following labs  and imaging studies  CBC Lab Results  Component Value Date   WBC 9.4 07/28/2022   RBC 3.35 (L) 07/28/2022   HGB 10.0 (L) 07/28/2022   HCT 28.7 (L) 07/28/2022   MCV 85.7 07/28/2022   MCH 29.9 07/28/2022   PLT 224 07/28/2022   MCHC 34.8 07/28/2022   RDW 13.2 07/28/2022   LYMPHSABS 2.3 07/27/2022   MONOABS 0.9 07/27/2022   EOSABS 0.1 07/27/2022   BASOSABS 0.0 07/37/1062     Last metabolic panel Lab Results  Component Value Date   NA 134 (L) 07/27/2022   K 3.7 07/27/2022   CL 104 07/27/2022   CO2 22 07/27/2022   BUN 23 07/27/2022   CREATININE 0.99 07/27/2022   GLUCOSE 127 (H) 07/27/2022   GFRNONAA >60 07/27/2022   GFRAA >60 03/24/2015   CALCIUM 8.6 (L) 07/27/2022   PHOS 2.1 (L) 07/27/2022   PROT 5.8 (L) 07/27/2022   ALBUMIN 3.1 (L) 07/27/2022   BILITOT 0.6 07/27/2022   ALKPHOS 56 07/27/2022   AST 26 07/27/2022   ALT 10 07/27/2022   ANIONGAP 8 07/27/2022    GFR: Estimated Creatinine Clearance: 61.3 mL/min (by C-G formula based on SCr of 0.99 mg/dL).  Recent Results (from the past 240 hour(s))  Blood Culture (Routine X 2)     Status: None   Collection Time: 07/19/22  2:50 PM   Specimen: BLOOD  Result Value Ref Range Status   Specimen Description BLOOD SITE NOT SPECIFIED  Final   Special Requests   Final    BOTTLES DRAWN AEROBIC AND ANAEROBIC Blood Culture adequate volume   Culture   Final    NO GROWTH 5 DAYS Performed at [facility], [address], [place], [place] [address]    Report Status 07/24/2022 FINAL  Final  Blood Culture (Routine X 2)     Status: None   Collection Time: 07/19/22  3:00 PM   Specimen: BLOOD  Result Value Ref Range Status   Specimen Description BLOOD SITE NOT SPECIFIED  Final   Special Requests   Final    BOTTLES DRAWN AEROBIC AND ANAEROBIC Blood Culture results may not be optimal due to an inadequate volume  of blood received in culture bottles   Culture   Final    NO GROWTH 5 DAYS Performed at [facility], [place] [address] [place], [place] [address]    Report Status 07/24/2022 FINAL  Final  Resp Panel by RT-PCR (Flu A&B, Covid) Anterior Nasal Swab     Status: None   Collection Time: 07/19/22  3:36 PM   Specimen: Anterior Nasal Swab  Result Value Ref Range Status   SARS Coronavirus 2 by RT PCR NEGATIVE NEGATIVE Final    Comment: (NOTE) SARS-CoV-2 target nucleic acids are NOT DETECTED.  The SARS-CoV-2 RNA is generally detectable in upper respiratory specimens during the acute phase of infection. The lowest concentration of SARS-CoV-2 viral copies this assay can detect is 138 copies/mL. A negative result does not preclude SARS-Cov-2 infection and should not be used as the sole basis for treatment or other patient management decisions. A negative result may occur with  improper specimen collection/handling, submission of specimen other than nasopharyngeal swab, presence of viral mutation(s) within the areas targeted by this assay, and inadequate number of viral copies(<138 copies/mL). A negative result must be combined with clinical observations, patient history, and epidemiological information. The expected result is Negative.  Fact Sheet for Patients:  EntrepreneurPulse.com.au  Fact Sheet for Healthcare Providers:  IncredibleEmployment.be  This test is no t yet approved or cleared by the [place] and  has been authorized for detection and/or diagnosis of SARS-CoV-2 by FDA under an Emergency Use Authorization (EUA). This EUA will remain  in effect (meaning this test can be used) for the duration of the COVID-19 declaration under Section 564(b)(1) of the Act, 21 U.S.C.section 360bbb-3(b)(1), unless the authorization is terminated  or revoked sooner.       Influenza A by PCR NEGATIVE NEGATIVE Final   Influenza B by PCR NEGATIVE  NEGATIVE Final    Comment: (NOTE) The Xpert Xpress SARS-CoV-2/FLU/RSV plus assay is intended as an aid in the diagnosis of influenza from Nasopharyngeal swab specimens and should not be used as a sole basis for treatment. Nasal washings and aspirates are unacceptable for Xpert Xpress SARS-CoV-2/FLU/RSV testing.  Fact Sheet for Patients: EntrepreneurPulse.com.au  Fact Sheet for Healthcare Providers: IncredibleEmployment.be  This test is not yet approved or cleared by the [place] FDA and has been authorized for detection and/or diagnosis of SARS-CoV-2 by FDA under an Emergency Use Authorization (EUA). This EUA will remain in effect (meaning this test can be used) for the duration of the COVID-19 declaration under Section 564(b)(1) of the Act, 21 U.S.C. section 360bbb-3(b)(1), unless the authorization is terminated or revoked.  Performed at [facility], [place] [address], [place], [place] [address]   Respiratory (~20 pathogens) panel by PCR     Status: None   Collection Time: 07/19/22  3:36 PM   Specimen: Nasopharyngeal Swab; Respiratory  Result Value Ref Range Status   Adenovirus NOT DETECTED NOT DETECTED Final   Coronavirus 229E NOT DETECTED NOT DETECTED Final    Comment: (NOTE) The Coronavirus on the Respiratory Panel, DOES NOT test for the novel  Coronavirus (2019 nCoV)    Coronavirus HKU1 NOT DETECTED NOT DETECTED Final   Coronavirus NL63 NOT DETECTED NOT DETECTED Final   Coronavirus OC43 NOT DETECTED NOT DETECTED Final   Metapneumovirus NOT DETECTED NOT DETECTED Final   Rhinovirus / Enterovirus NOT DETECTED NOT DETECTED Final   Influenza A NOT DETECTED NOT DETECTED Final   Influenza B NOT DETECTED NOT DETECTED Final   Parainfluenza Virus 1 NOT DETECTED NOT DETECTED Final   Parainfluenza Virus 2 NOT DETECTED NOT DETECTED Final   Parainfluenza Virus 3 NOT DETECTED NOT DETECTED Final   Parainfluenza Virus 4 NOT DETECTED NOT  DETECTED Final   Respiratory Syncytial Virus NOT DETECTED NOT DETECTED Final   Bordetella pertussis NOT DETECTED NOT DETECTED Final   Bordetella Parapertussis NOT DETECTED NOT DETECTED Final   Chlamydophila pneumoniae NOT DETECTED NOT DETECTED Final   Mycoplasma pneumoniae NOT DETECTED NOT DETECTED Final    Comment: Performed at [facility], [place], [place], [place] [address]  MRSA Next Gen by PCR, Nasal     Status: None   Collection Time: 07/24/22 10:25 PM   Specimen: Nasal Mucosa; Nasal Swab  Result Value Ref Range Status   MRSA by PCR Next Gen NOT DETECTED NOT DETECTED Final    Comment: (NOTE) The GeneXpert MRSA Assay (FDA approved for NASAL specimens only), is one component of a comprehensive MRSA colonization surveillance program. It is not intended to diagnose MRSA infection nor to guide or monitor treatment for MRSA infections. Test performance is not FDA approved in patients less than 52 years old. Performed at [facility], [place] [address], [place], [place] [address]       Radiology Studies: No  results found.    LOS: 9 days    Cordelia Poche, MD [facility] 07/29/2022, 9:45 AM   If 7PM-7AM, please contact night-coverage [email or web address]

## 2022-07-30 LAB — GLUCOSE, CAPILLARY
Glucose-Capillary: 122 mg/dL — ABNORMAL HIGH (ref 70–99)
Glucose-Capillary: 163 mg/dL — ABNORMAL HIGH (ref 70–99)
Glucose-Capillary: 103 mg/dL — ABNORMAL HIGH (ref 70–99)

## 2022-07-30 MED ORDER — GABAPENTIN 300 MG PO CAPS
300.0000 mg | ORAL_CAPSULE | Freq: Once | ORAL | Status: AC
  Administered 2022-07-30: 300 mg via ORAL
  Filled 2022-07-30: qty 1

## 2022-07-30 MED ORDER — EZETIMIBE 10 MG PO TABS: 10.0000 mg | ORAL_TABLET | Freq: Every day | ORAL | 2 refills | Status: AC

## 2022-07-30 MED ORDER — ASPIRIN 325 MG PO TABS: 325.0000 mg | ORAL_TABLET | Freq: Every day | ORAL | 2 refills | Status: AC

## 2022-07-30 MED ORDER — METOPROLOL SUCCINATE ER 25 MG PO TB24: 12.5000 mg | ORAL_TABLET | Freq: Every day | ORAL | Status: AC

## 2022-07-30 MED ORDER — INFLUENZA VAC A&B SA ADJ QUAD 0.5 ML IM PRSY
0.5000 mL | PREFILLED_SYRINGE | Freq: Once | INTRAMUSCULAR | Status: AC
  Administered 2022-07-30: 0.5 mL via INTRAMUSCULAR
  Filled 2022-07-30: qty 0.5

## 2022-07-30 NOTE — Discharge Summary (Signed)
Physician Discharge Summary   Patient: [name] MRN: [id number] DOB: 1935-02-07  Admit date:     07/19/2022  Discharge date: 07/30/2022  Discharge Physician: Cordelia Poche, MD   PCP: Ginger Organ., MD   Recommendations at discharge:  PCP follow-up Vascular surgery follow-up in 1 month Neurology follow-up in 1 month Cardiology follow-up for valvular insufficiency  Discharge Diagnoses: Principal Problem:   Acute encephalopathy Active Problems:   Pulmonary infiltrates   Type 2 diabetes mellitus with stage 3a chronic kidney disease, without long-term current use of insulin (HCC)   Coronary artery disease involving native heart without angina pectoris   Essential hypertension   Chronic kidney disease, stage 3a ([place])   Dementia without behavioral disturbance ([place])   GERD without esophagitis   Stroke ([place])   Shock ([place])  Resolved Problems:   * No resolved hospital problems. *  Hospital Course: [name] is a 87 y.o. male with a history of CAD, diabetes mellitus type 2, GERD, hyperlipidemia, PAD, CVA. Patient presented secondary to altered mental status and found to have an acute left basal ganglia infarct with associated severe left ICA stenosis s/p left TCAR and shockwave lithotripsy. Hospital complicated by refractory post operative shock.  Assessment and Plan:  Postoperative hypotension Distributive shock Shock appears to be a postoperative complication of procedure. Patient initially requiring dopamine immediately post-op. Midodrine started. Discussed with PCCM on 9/27 who recommended IV fluid bolus followed by maintenance IV fluids in addition to increase to midodrine 10 mg TID. Blood pressure improved and stable. Dopamine and midodrine discontinued.   Acute left basal ganglia infarct MRI confirms left basal ganglia infarct. CTA head/neck results below. LDL of 104. Hemoglobin A1C of 6.5%. Transthoracic Echocardiogram significant for LVEF of 60-65%. Neurology  recommendations for outpatient follow-up and antiplatelet therapy per vascular surgery. Patient is statin intolerant. PT/OT recommendations for SNF.    Severe intracranial/extraluminal stenosis CTA head/neck significant for left proximal M1 near occlusion in addition to 75% right ICA and 90% left ICA stenosis, left subclavian moderate stenosis proximal to left VA takeoff and bilateral ICA siphon severe atherosclerosis. Vascular surgery consulted and performed left TCAR with shock wave on 9/25. Vascular surgery recommending DAPT with aspirin and Plavix.   Hemoptysis Streaky. Likely secondary to recent intubation and dual antiplatelet therapy. Improved. Hemoglobin stable.   Dysphagia Patient evaluated by speech therapy. Recommendation for dysphagia 2 diet and staff to assist with self feeding.   Acute metabolic encephalopathy Multifactorial in setting of acute illness/delirium. Improved and back to baseline.   Acute anemia Baseline hemoglobin of about 13. Hemoglobin down to a low of 9.5 this admission and is stable. No evidence of bleeding. Likely related to acute perioperative blood loss.   Pulmonary infiltrates Concern for possible pneumonia; clinically undetermined. Patient started on Ceftriaxone and azithromycin and completed course.   Diabetes mellitus type 2 Resume home regimen.   CAD History of CABG. Not on a statin secondary to intolerance. Patient is on Toprol XL and Plavix. Asymptomatic. Toprol XL held secondary to hypotension. Continue Plavix and Zetia.  CKD stage IIIa Stable.   Primary hypertension Patient is on ramipril and metoprolol which were held secondary to refractory hypotension. Metoprolol decreased and ramipril discontinued on discharge.   Dementia Delirium precautions while inpatient.   GERD without esophagitis Continue Protonix   Moderate/severe mitral valve regurgitation Noted. Outpatient follow-up.   Consultants: Neurology, Vascular surgery,  PCCM Procedures performed:  Transcarotid Artery Revascularization W/ SHOCK WAVE (Left: Neck) Transthoracic Echocardiogram  Disposition: Home health Diet recommendation: Dysphagia type 2 with nectar thick Liquid   DISCHARGE MEDICATION: Allergies as of 07/30/2022       Reactions   Atorvastatin Other (See Comments)   Muscle pain   Rosuvastatin Other (See Comments)   Muscle pain   Simvastatin Other (See Comments)   Muscle pain   Sulfonamide Derivatives Hives        Medication List     STOP taking these medications    ramipril 5 MG capsule Commonly known as: ALTACE       TAKE these medications    acetaminophen 500 MG tablet Commonly known as: TYLENOL Take 500 mg by mouth every 6 (six) hours as needed for moderate pain.   aspirin 325 MG tablet Take 1 tablet (325 mg total) by mouth daily.   cholecalciferol 25 MCG (1000 UNIT) tablet Commonly known as: VITAMIN D3 Take 1,000 Units by mouth daily.   clopidogrel 75 MG tablet Commonly known as: PLAVIX Take 75 mg by mouth daily with supper.   ezetimibe 10 MG tablet Commonly known as: ZETIA Take 1 tablet (10 mg total) by mouth daily.   magnesium oxide 400 (240 Mg) MG tablet Commonly known as: MAG-OX Take 1 tablet (400 mg total) by mouth daily.   metFORMIN 1000 MG tablet Commonly known as: GLUCOPHAGE Take 1 tablet (1,000 mg total) by mouth daily with supper. What changed: how much to take   metoprolol succinate 25 MG 24 hr tablet Commonly known as: TOPROL-XL Take 0.5 tablets (12.5 mg total) by mouth daily with supper. What changed: how much to take   OneTouch Delica Lancets 50Y Misc Pt to check BS daily  Dx: E11.49   OneTouch Verio test strip Generic drug: glucose blood Check sugars daily Dx: E11.49   pantoprazole 40 MG tablet Commonly known as: PROTONIX Take 40 mg by mouth daily before supper.   polyethylene glycol 17 g packet Commonly known as: MIRALAX / GLYCOLAX Take 17 g by mouth daily as needed  for mild constipation.   Wheelchair Misc Dx: Muscle Weakness        Follow-up Information     Melvenia Beam, MD. Schedule an appointment as soon as possible for a visit in 1 month(s).   Specialty: Neurology Contact information: [address] [place] [address] [phone]         [facility] Follow up in 4 week(s).   Specialty: Vascular Surgery Why: Office will call you to arrange your appt (sent). Contact information: [address] [place] [facility] up.   Why: HH-PT/OT/SLP- services to continue Contact information: [place]- [phone] Fax- [phone]        Ginger Organ., MD. Schedule an appointment as soon as possible for a visit in 1 week(s).   Specialty: Internal Medicine Why: For hospital follow-up Contact information: [place] [address] [phone]                Discharge Exam: BP 123/74 (BP Location: Right Arm)   Pulse 72   Temp 98.1 F (36.7 C) (Axillary)   Resp 16   Ht '6\' 2"'$  (1.88 m)   Wt 79.4 kg   SpO2 97%   BMI 22.47 kg/m   General exam: Appears calm and comfortable Respiratory system: Respiratory effort normal. Gastrointestinal system: Abdomen is nondistended, soft and nontender. Central nervous system: Alert.  Condition at discharge: stable  The results of significant diagnostics from this hospitalization (including imaging, microbiology, ancillary and laboratory) are listed below for reference.   Imaging Studies: Structural Heart Procedure  Result Date: 07/25/2022 See surgical note for result.  HYBRID OR IMAGING ([facility] ONLY)  Result Date: 07/25/2022 There is no interpretation for this exam.  This order is for images obtained during a surgical procedure.  Please See "Surgeries" Tab for more information regarding the procedure.   ECHOCARDIOGRAM COMPLETE  Result Date: 07/20/2022     ECHOCARDIOGRAM REPORT   Patient Name:   Edie Loletha Grayer Robart Date of Exam: 07/20/2022 Medical Rec #:  [id number]    Height:       74.0 in Accession #:    [id number]   Weight:       175.0 lb Date of Birth:  02/03/1935     BSA:          2.054 m Patient Age:    21 years     BP:           144/64 mmHg Patient Gender: M            HR:           53 bpm. Exam Location:  Inpatient Procedure: 2D Echo, Color Doppler and Cardiac Doppler Indications:    Stroke  History:        Patient has prior history of Echocardiogram examinations, most                 recent 03/03/2020. CAD; Risk Factors:Diabetes, Dyslipidemia and                 Hypertension.  Sonographer:    Memory Argue Referring Phys: [phone] A CALDWELL [place]  1. Left ventricular ejection fraction, by estimation, is 60 to 65%. The left ventricle has normal function. The left ventricle has no regional wall motion abnormalities. Left ventricular diastolic function could not be evaluated.  2. Right ventricular systolic function was not well visualized. The right ventricular size is normal.  3. Left atrial size was mildly dilated.  4. The mitral valve is abnormal. Not well visualized mitral valve regurgitation. No evidence of mitral stenosis.  5. The aortic valve is tricuspid. There is moderate calcification of the aortic valve. There is mild thickening of the aortic valve. Aortic valve regurgitation is trivial. Aortic valve sclerosis/calcification is present, without any evidence of aortic stenosis. Comparison(s): Changes from prior study are noted. Mitral regurgitation noted as moderate to severe on prior study. Not well visualized on current study. Conclusion(s)/Recommendation(s): Otherwise normal echocardiogram, with minor abnormalities described in the report. No intracardiac source of embolism detected on this transthoracic study. Consider a transesophageal echocardiogram to exclude cardiac source of embolism if clinically indicated. FINDINGS  Left Ventricle: Left  ventricular ejection fraction, by estimation, is 60 to 65%. The left ventricle has normal function. The left ventricle has no regional wall motion abnormalities. The left ventricular internal cavity size was normal in size. There is  no left ventricular hypertrophy. Left ventricular diastolic function could not be evaluated due to atrial fibrillation. Left ventricular diastolic function could not be evaluated. Right Ventricle: The right ventricular size is normal. Right vetricular wall thickness was not well visualized. Right ventricular systolic function was not well visualized. Left Atrium: Left atrial size was mildly dilated. Right Atrium: Right atrial size was normal in size. Pericardium: There is no evidence of pericardial effusion. Mitral Valve: Calcified subvalvular apparatus. The mitral valve is abnormal. There is mild thickening of  the mitral valve leaflet(s). There is moderate calcification of the mitral valve leaflet(s). Not well visualized mitral valve regurgitation. No evidence of mitral valve stenosis. Tricuspid Valve: The tricuspid valve is normal in structure. Tricuspid valve regurgitation is mild . No evidence of tricuspid stenosis. Aortic Valve: The aortic valve is tricuspid. There is moderate calcification of the aortic valve. There is mild thickening of the aortic valve. Aortic valve regurgitation is trivial. Aortic valve sclerosis/calcification is present, without any evidence of aortic stenosis. Aortic valve mean gradient measures 3.0 mmHg. Aortic valve peak gradient measures 5.0 mmHg. Aortic valve area, by VTI measures 2.22 cm. Pulmonic Valve: The pulmonic valve was not well visualized. Pulmonic valve regurgitation is not visualized. Aorta: The ascending aorta was not well visualized. Venous: The inferior vena cava was not well visualized. IAS/Shunts: The interatrial septum was not well visualized.  LEFT VENTRICLE PLAX 2D LVIDd:         4.60 cm   Diastology LVIDs:         3.20 cm   LV e'  medial:    6.31 cm/s LV PW:         1.00 cm   LV E/e' medial:  18.7 LV IVS:        1.00 cm   LV e' lateral:   6.64 cm/s LVOT diam:     2.20 cm   LV E/e' lateral: 17.8 LV SV:         62 LV SV Index:   30 LVOT Area:     3.80 cm  RIGHT VENTRICLE RV S prime:     7.83 cm/s LEFT ATRIUM             Index        RIGHT ATRIUM           Index LA diam:        3.20 cm 1.56 cm/m   RA Area:     11.50 cm LA Vol (A2C):   44.6 ml 21.72 ml/m  RA Volume:   20.80 ml  10.13 ml/m LA Vol (A4C):   68.4 ml 33.31 ml/m LA Biplane Vol: 56.0 ml 27.27 ml/m  AORTIC VALVE AV Area (Vmax):    2.37 cm AV Area (Vmean):   2.20 cm AV Area (VTI):     2.22 cm AV Vmax:           112.00 cm/s AV Vmean:          74.900 cm/s AV VTI:            0.281 m AV Peak Grad:      5.0 mmHg AV Mean Grad:      3.0 mmHg LVOT Vmax:         69.70 cm/s LVOT Vmean:        43.300 cm/s LVOT VTI:          0.164 m LVOT/AV VTI ratio: 0.58  AORTA Ao Root diam: 2.60 cm MITRAL VALVE                TRICUSPID VALVE MV Area (PHT): 3.53 cm     TR Peak grad:   20.8 mmHg MV Decel Time: 215 msec     TR Vmax:        228.00 cm/s MR Peak grad: 61.3 mmHg MR Vmax:      391.33 cm/s   SHUNTS MV E velocity: 118.00 cm/s  Systemic VTI:  0.16 m MV A velocity: 120.00 cm/s  Systemic Diam: 2.20 cm MV  E/A ratio:  0.98 Buford Dresser MD Electronically signed by Buford Dresser MD Signature Date/Time: 07/20/2022/6:24:41 PM    Final    CT ANGIO HEAD NECK W WO CM  Result Date: 07/20/2022 CLINICAL DATA:  Stroke follow-up EXAM: CT ANGIOGRAPHY HEAD AND NECK TECHNIQUE: Multidetector CT imaging of the head and neck was performed using the standard protocol during bolus administration of intravenous contrast. Multiplanar CT image reconstructions and MIPs were obtained to evaluate the vascular anatomy. Carotid stenosis measurements (when applicable) are obtained utilizing NASCET criteria, using the distal internal carotid diameter as the denominator. RADIATION DOSE REDUCTION: This exam was  performed according to the departmental dose-optimization program which includes automated exposure control, adjustment of the mA and/or kV according to patient size and/or use of iterative reconstruction technique. CONTRAST:  55m OMNIPAQUE IOHEXOL 350 MG/ML SOLN COMPARISON:  MRI head 07/19/2022, CT head 07/19/2022 FINDINGS: Limitations: Assessment is slightly limited due to the degree of motion artifact. Within this limitation CT HEAD FINDINGS Brain: Redemonstrated severe chronic microvascular ischemic change and advanced generalized volume loss. Redemonstrated hypodensity in the left basal ganglia in the region of known infarction. Unchanged size and shape of the ventricular system. No evidence of intracranial hemorrhage. Vascular: See below for vascular findings. Skull: Normal. Negative for fracture or focal lesion. Sinuses/Orbits: Bilateral lens replacement.  Sinuses are clear. Other: None. Review of the MIP images confirms the above findings CTA NECK FINDINGS Aortic arch: Standard branching. Imaged portion shows no evidence of aneurysm or dissection. No significant stenosis of the major arch vessel origins. Right carotid system: There is scattered atherosclerotic plaque in the right common carotid artery. There is severe greater than 75% stenosis at the origin of the right internal carotid artery (series 15, image 212) Left carotid system: There is scattered atherosclerotic plaque in the left common carotid artery. There is severe (approximate 90% stenosis at the origin of the left internal carotid artery (series 15, image 217). Vertebral arteries: There is mild stenosis of the origin of the right vertebral artery. There is mild stenosis in the distal V2 segment of the left vertebral artery (series 15, image 149) Skeleton: Negative. There is likely at least moderate spinal canal stenosis at C4-C5. There is a degenerative pannus at C1-C2. Other neck: Negative. Upper chest: There is severe centrilobular  emphysema. Review of the MIP images confirms the above findings CTA HEAD FINDINGS Anterior circulation: There is near occlusion of the proximal M1 segment of the left MCA (series 15, image 90). The distal M1 and M2 segments are contrast opacified. Assessment of the A2 and A3 segments of the ACA is limited due to motion artifact. Posterior circulation: Assessment of the P2 and P3 segments of the bilateral PCAs is limited due to motion artifact. No significant stenosis, proximal occlusion, aneurysm, or vascular malformation. Venous sinuses: As permitted by contrast timing, patent. Anatomic variants: None Review of the MIP images confirms the above findings IMPRESSION: 1. Near occlusion of the proximal M1 segment of the left MCA with reconstitution of the distal M1 and M2 segments. 2. Severe stenosis at the origin of bilateral internal carotid arteries (greater than 75% on the right and approximately 90% on the left). 3. Redemonstrated hypodensities in the left basal ganglia, in keeping with patient's history of left basal ganglia infarct. No evidence of intracranial hemorrhage. 4. There is atherosclerotic plaque in the left subclavian artery resulting in moderate stenosis, proximal to the takeoff of the left vertebral artery. Electronically Signed   By: HMarin OlpD.  On: 07/20/2022 09:22   MR BRAIN WO CONTRAST  Result Date: 07/19/2022 CLINICAL DATA:  Initial evaluation for neuro deficit, stroke suspected. EXAM: MRI HEAD WITHOUT CONTRAST TECHNIQUE: Multiplanar, multiecho pulse sequences of the brain and surrounding structures were obtained without intravenous contrast. COMPARISON:  Prior CT from earlier the same day as well as earlier studies. FINDINGS: Brain: Examination degraded by motion artifact. Generalized age-related cerebral atrophy. Patchy and confluent T2/FLAIR hyperintensity involving the periventricular deep white matter both cerebral hemispheres as well as the pons, most consistent with chronic  small vessel ischemic disease. Few scatter remote lacunar infarcts noted about the deep gray nuclei. 2.3 cm acute ischemic nonhemorrhagic infarcts seen involving the left basal ganglia (series 5, image 83). No other evidence for acute or subacute ischemia. Gray-white matter differentiation otherwise maintained. No acute or chronic intracranial blood products. No mass lesion, midline shift or mass effect. Diffuse ventricular prominence related to global parenchymal volume loss without hydrocephalus. No extra-axial fluid collection. Pituitary gland and suprasellar region within normal limits. Vascular: Major intracranial vascular flow voids are maintained. Skull and upper cervical spine: Craniocervical junction within normal limits. Bone marrow signal intensity normal. No scalp soft tissue abnormality. Sinuses/Orbits: Patient status post bilateral ocular lens replacement. Scattered mucosal thickening noted about the paranasal sinuses. No significant mastoid effusion. Other: None. IMPRESSION: 1. 2.3 cm acute ischemic nonhemorrhagic left basal ganglia infarct. 2. Underlying age-related cerebral atrophy with mild chronic small vessel ischemic disease. Electronically Signed   By: Jeannine Boga M.D.   On: 07/19/2022 23:54   CT HEAD WO CONTRAST  Result Date: 07/19/2022 CLINICAL DATA:  Mental status change, persistent or worsening EXAM: CT HEAD WITHOUT CONTRAST TECHNIQUE: Contiguous axial images were obtained from the base of the skull through the vertex without intravenous contrast. RADIATION DOSE REDUCTION: This exam was performed according to the departmental dose-optimization program which includes automated exposure control, adjustment of the mA and/or kV according to patient size and/or use of iterative reconstruction technique. COMPARISON:  CT head 07/09/2021. FINDINGS: Motion limited study. Brain: No evidence of acute large vascular territory infarct, acute hemorrhage, mass lesion, midline shift or  hydrocephalus. Cerebral atrophy with ex vacuo ventricular dilation. Confluent and patchy hypodensities in the white matter, nonspecific but likely the sequela of chronic microvascular ischemic disease. Vascular: No hyperdense vessel identified. Calcific intracranial atherosclerosis. Skull: No acute fracture. Sinuses/Orbits: Largely clear sinuses.  No acute orbital findings. Other: No mastoid effusions. IMPRESSION: 1. No evidence of acute intracranial abnormality. 2. Similar chronic microvascular ischemic disease and cerebral atrophy (ICD10-G31.9). Electronically Signed   By: Margaretha Sheffield M.D.   On: 07/19/2022 15:52   DG Chest Port 1 View  Result Date: 07/19/2022 CLINICAL DATA:  Altered mental status. EXAM: PORTABLE CHEST 1 VIEW COMPARISON:  December 05, 2021. FINDINGS: Stable cardiomediastinal silhouette. Status post coronary bypass graft. Mild bibasilar subsegmental atelectasis or infiltrates are noted. Bony thorax is unremarkable. IMPRESSION: Mild bibasilar subsegmental atelectasis or infiltrates. Electronically Signed   By: Marijo Conception M.D.   On: 07/19/2022 14:38    Microbiology: Results for orders placed or performed during the hospital encounter of 07/19/22  Blood Culture (Routine X 2)     Status: None   Collection Time: 07/19/22  2:50 PM   Specimen: BLOOD  Result Value Ref Range Status   Specimen Description BLOOD SITE NOT SPECIFIED  Final   Special Requests   Final    BOTTLES DRAWN AEROBIC AND ANAEROBIC Blood Culture adequate volume   Culture   Final  NO GROWTH 5 DAYS Performed at [facility], [place] [address] [place], [place] [address]    Report Status 07/24/2022 FINAL  Final  Blood Culture (Routine X 2)     Status: None   Collection Time: 07/19/22  3:00 PM   Specimen: BLOOD  Result Value Ref Range Status   Specimen Description BLOOD SITE NOT SPECIFIED  Final   Special Requests   Final    BOTTLES DRAWN AEROBIC AND ANAEROBIC Blood Culture results may not be optimal  due to an inadequate volume of blood received in culture bottles   Culture   Final    NO GROWTH 5 DAYS Performed at [facility], [place] [address] [place], [place] [address]    Report Status 07/24/2022 FINAL  Final  Resp Panel by RT-PCR (Flu A&B, Covid) Anterior Nasal Swab     Status: None   Collection Time: 07/19/22  3:36 PM   Specimen: Anterior Nasal Swab  Result Value Ref Range Status   SARS Coronavirus 2 by RT PCR NEGATIVE NEGATIVE Final    Comment: (NOTE) SARS-CoV-2 target nucleic acids are NOT DETECTED.  The SARS-CoV-2 RNA is generally detectable in upper respiratory specimens during the acute phase of infection. The lowest concentration of SARS-CoV-2 viral copies this assay can detect is 138 copies/mL. A negative result does not preclude SARS-Cov-2 infection and should not be used as the sole basis for treatment or other patient management decisions. A negative result may occur with  improper specimen collection/handling, submission of specimen other than nasopharyngeal swab, presence of viral mutation(s) within the areas targeted by this assay, and inadequate number of viral copies(<138 copies/mL). A negative result must be combined with clinical observations, patient history, and epidemiological information. The expected result is Negative.  Fact Sheet for Patients:  EntrepreneurPulse.com.au  Fact Sheet for Healthcare Providers:  IncredibleEmployment.be  This test is no t yet approved or cleared by the [place] FDA and  has been authorized for detection and/or diagnosis of SARS-CoV-2 by FDA under an Emergency Use Authorization (EUA). This EUA will remain  in effect (meaning this test can be used) for the duration of the COVID-19 declaration under Section 564(b)(1) of the Act, 21 U.S.C.section 360bbb-3(b)(1), unless the authorization is terminated  or revoked sooner.       Influenza A by PCR NEGATIVE NEGATIVE Final    Influenza B by PCR NEGATIVE NEGATIVE Final    Comment: (NOTE) The Xpert Xpress SARS-CoV-2/FLU/RSV plus assay is intended as an aid in the diagnosis of influenza from Nasopharyngeal swab specimens and should not be used as a sole basis for treatment. Nasal washings and aspirates are unacceptable for Xpert Xpress SARS-CoV-2/FLU/RSV testing.  Fact Sheet for Patients: EntrepreneurPulse.com.au  Fact Sheet for Healthcare Providers: IncredibleEmployment.be  This test is not yet approved or cleared by the [place] FDA and has been authorized for detection and/or diagnosis of SARS-CoV-2 by FDA under an Emergency Use Authorization (EUA). This EUA will remain in effect (meaning this test can be used) for the duration of the COVID-19 declaration under Section 564(b)(1) of the Act, 21 U.S.C. section 360bbb-3(b)(1), unless the authorization is terminated or revoked.  Performed at [facility], [place] [address], [place],  [address]   Respiratory (~20 pathogens) panel by PCR     Status: None   Collection Time: 07/19/22  3:36 PM   Specimen: Nasopharyngeal Swab; Respiratory  Result Value Ref Range Status   Adenovirus NOT DETECTED NOT DETECTED Final  Coronavirus 229E NOT DETECTED NOT DETECTED Final    Comment: (NOTE) The Coronavirus on the Respiratory Panel, DOES NOT test for the novel  Coronavirus (2019 nCoV)    Coronavirus HKU1 NOT DETECTED NOT DETECTED Final   Coronavirus NL63 NOT DETECTED NOT DETECTED Final   Coronavirus OC43 NOT DETECTED NOT DETECTED Final   Metapneumovirus NOT DETECTED NOT DETECTED Final   Rhinovirus / Enterovirus NOT DETECTED NOT DETECTED Final   Influenza A NOT DETECTED NOT DETECTED Final   Influenza B NOT DETECTED NOT DETECTED Final   Parainfluenza Virus 1 NOT DETECTED NOT DETECTED Final   Parainfluenza Virus 2 NOT DETECTED NOT DETECTED Final   Parainfluenza Virus 3 NOT DETECTED NOT DETECTED Final   Parainfluenza  Virus 4 NOT DETECTED NOT DETECTED Final   Respiratory Syncytial Virus NOT DETECTED NOT DETECTED Final   Bordetella pertussis NOT DETECTED NOT DETECTED Final   Bordetella Parapertussis NOT DETECTED NOT DETECTED Final   Chlamydophila pneumoniae NOT DETECTED NOT DETECTED Final   Mycoplasma pneumoniae NOT DETECTED NOT DETECTED Final    Comment: Performed at [facility], [place] [address] [name], [place] [address]  MRSA Next Gen by PCR, Nasal     Status: None   Collection Time: 07/24/22 10:25 PM   Specimen: Nasal Mucosa; Nasal Swab  Result Value Ref Range Status   MRSA by PCR Next Gen NOT DETECTED NOT DETECTED Final    Comment: (NOTE) The GeneXpert MRSA Assay (FDA approved for NASAL specimens only), is one component of a comprehensive MRSA colonization surveillance program. It is not intended to diagnose MRSA infection nor to guide or monitor treatment for MRSA infections. Test performance is not FDA approved in patients less than 27 years old. Performed at [facility], [place] [address], [place], [place] [address]     Labs: CBC: Recent Labs  Lab 07/24/22 [phone] 07/25/22 1618 07/25/22 1817 07/26/22 0655 07/27/22 0500 07/28/22 0929  WBC 10.2 9.4 12.4* 12.0* 10.0 9.4  NEUTROABS 6.3  --   --   --  6.6  --   HGB 13.2 9.5* 11.0* 11.0* 10.9* 10.0*  HCT 38.3* 26.8* 31.4* 30.9* 30.8* 28.7*  MCV 86.8 85.6 85.8 85.6 84.4 85.7  PLT 206 159 205 228 241 876   Basic Metabolic Panel: Recent Labs  Lab 07/24/22 0718 07/25/22 1618 07/26/22 0655 07/27/22 0500  NA 135 135 133* 134*  K 3.9 3.9 4.1 3.7  CL 101 110 102 104  CO2 23 17* 18* 22  GLUCOSE 125* 138* 165* 127*  BUN 19 16 27* 23  CREATININE 1.07 0.80 1.24 0.99  CALCIUM 9.1 7.5* 8.6* 8.6*  MG 1.9  --   --  1.5*  PHOS 3.2  --   --  2.1*   Liver Function Tests: Recent Labs  Lab 07/24/22 0718 07/27/22 0500  AST 18 26  ALT 11 10  ALKPHOS 67 56  BILITOT 0.5 0.6  PROT 6.2* 5.8*  ALBUMIN 3.3* 3.1*   CBG: Recent  Labs  Lab 07/29/22 1133 07/29/22 1608 07/29/22 2127 07/30/22 0630 07/30/22 1150  GLUCAP 133* 123* 103* 122* 163*    Discharge time spent: 35 minutes.  Signed: Cordelia Poche, MD [facility] 07/30/2022

## 2022-07-30 NOTE — Plan of Care (Signed)
Problem: Education: Goal: Ability to describe self-care measures that may prevent or decrease complications (Diabetes Survival Skills Education) will improve Outcome: Adequate for Discharge Goal: Individualized Educational Video(s) Outcome: Adequate for Discharge   Problem: Coping: Goal: Ability to adjust to condition or change in health will improve Outcome: Adequate for Discharge   Problem: Fluid Volume: Goal: Ability to maintain a balanced intake and output will improve Outcome: Adequate for Discharge   Problem: Health Behavior/Discharge Planning: Goal: Ability to identify and utilize available resources and services will improve Outcome: Adequate for Discharge Goal: Ability to manage health-related needs will improve Outcome: Adequate for Discharge   Problem: Metabolic: Goal: Ability to maintain appropriate glucose levels will improve Outcome: Adequate for Discharge   Problem: Nutritional: Goal: Maintenance of adequate nutrition will improve Outcome: Adequate for Discharge Goal: Progress toward achieving an optimal weight will improve Outcome: Adequate for Discharge   Problem: Skin Integrity: Goal: Risk for impaired skin integrity will decrease Outcome: Adequate for Discharge   Problem: Tissue Perfusion: Goal: Adequacy of tissue perfusion will improve Outcome: Adequate for Discharge   Problem: Activity: Goal: Ability to tolerate increased activity will improve Outcome: Adequate for Discharge   Problem: Clinical Measurements: Goal: Ability to maintain a body temperature in the normal range will improve Outcome: Adequate for Discharge   Problem: Respiratory: Goal: Ability to maintain adequate ventilation will improve Outcome: Adequate for Discharge Goal: Ability to maintain a clear airway will improve Outcome: Adequate for Discharge   Problem: Acute Rehab OT Goals (only OT should resolve) Goal: Pt. Will Perform Eating Outcome: Adequate for Discharge Goal:  Pt. Will Perform Grooming Outcome: Adequate for Discharge Goal: OT Additional ADL Goal #1 Outcome: Adequate for Discharge Goal: OT Additional ADL Goal #2 Outcome: Adequate for Discharge   Problem: Education: Goal: Knowledge of General Education information will improve Description: Including pain rating scale, medication(s)/side effects and non-pharmacologic comfort measures Outcome: Adequate for Discharge   Problem: Health Behavior/Discharge Planning: Goal: Ability to manage health-related needs will improve Outcome: Adequate for Discharge   Problem: Clinical Measurements: Goal: Ability to maintain clinical measurements within normal limits will improve Outcome: Adequate for Discharge Goal: Will remain free from infection Outcome: Adequate for Discharge Goal: Diagnostic test results will improve Outcome: Adequate for Discharge Goal: Respiratory complications will improve Outcome: Adequate for Discharge Goal: Cardiovascular complication will be avoided Outcome: Adequate for Discharge   Problem: Activity: Goal: Risk for activity intolerance will decrease Outcome: Adequate for Discharge   Problem: Nutrition: Goal: Adequate nutrition will be maintained Outcome: Adequate for Discharge   Problem: Coping: Goal: Level of anxiety will decrease Outcome: Adequate for Discharge   Problem: Elimination: Goal: Will not experience complications related to bowel motility Outcome: Adequate for Discharge Goal: Will not experience complications related to urinary retention Outcome: Adequate for Discharge   Problem: Pain Managment: Goal: General experience of comfort will improve Outcome: Adequate for Discharge   Problem: Safety: Goal: Ability to remain free from injury will improve Outcome: Adequate for Discharge   Problem: Skin Integrity: Goal: Risk for impaired skin integrity will decrease Outcome: Adequate for Discharge   Problem: Acute Rehab PT Goals(only PT should  resolve) Goal: Pt Will Go Supine/Side To Sit Outcome: Adequate for Discharge Goal: Pt Will Go Sit To Supine/Side Outcome: Adequate for Discharge Goal: Patient Will Transfer Sit To/From Stand Outcome: Adequate for Discharge Goal: Pt Will Transfer Bed To Chair/Chair To Bed Outcome: Adequate for Discharge   Problem: SLP Dysphagia Goals Goal: Patient will utilize recommended strategies Description: Patient will  utilize recommended strategies during swallow to increase swallowing safety with Outcome: Adequate for Discharge   Problem: Education: Goal: Knowledge of disease or condition will improve Outcome: Adequate for Discharge Goal: Knowledge of secondary prevention will improve (SELECT ALL) Outcome: Adequate for Discharge Goal: Knowledge of patient specific risk factors will improve (INDIVIDUALIZE FOR PATIENT) Outcome: Adequate for Discharge Goal: Individualized Educational Video(s) Outcome: Adequate for Discharge

## 2022-07-31 DIAGNOSIS — R2689 Other abnormalities of gait and mobility: Secondary | ICD-10-CM | POA: Diagnosis not present

## 2022-07-31 DIAGNOSIS — R278 Other lack of coordination: Secondary | ICD-10-CM | POA: Diagnosis not present

## 2022-07-31 DIAGNOSIS — M6281 Muscle weakness (generalized): Secondary | ICD-10-CM | POA: Diagnosis not present

## 2022-07-31 DIAGNOSIS — R296 Repeated falls: Secondary | ICD-10-CM | POA: Diagnosis not present

## 2022-08-01 DIAGNOSIS — R2689 Other abnormalities of gait and mobility: Secondary | ICD-10-CM | POA: Diagnosis not present

## 2022-08-01 DIAGNOSIS — M6281 Muscle weakness (generalized): Secondary | ICD-10-CM | POA: Diagnosis not present

## 2022-08-02 DIAGNOSIS — R278 Other lack of coordination: Secondary | ICD-10-CM | POA: Diagnosis not present

## 2022-08-02 DIAGNOSIS — R296 Repeated falls: Secondary | ICD-10-CM | POA: Diagnosis not present

## 2022-08-03 DIAGNOSIS — R2689 Other abnormalities of gait and mobility: Secondary | ICD-10-CM | POA: Diagnosis not present

## 2022-08-03 DIAGNOSIS — M6281 Muscle weakness (generalized): Secondary | ICD-10-CM | POA: Diagnosis not present

## 2022-08-04 DIAGNOSIS — M6281 Muscle weakness (generalized): Secondary | ICD-10-CM | POA: Diagnosis not present

## 2022-08-04 DIAGNOSIS — R278 Other lack of coordination: Secondary | ICD-10-CM | POA: Diagnosis not present

## 2022-08-05 DIAGNOSIS — R1314 Dysphagia, pharyngoesophageal phase: Secondary | ICD-10-CM | POA: Diagnosis not present

## 2022-08-05 DIAGNOSIS — R4701 Aphasia: Secondary | ICD-10-CM | POA: Diagnosis not present

## 2022-08-05 DIAGNOSIS — R1312 Dysphagia, oropharyngeal phase: Secondary | ICD-10-CM | POA: Diagnosis not present

## 2022-08-05 DIAGNOSIS — R41841 Cognitive communication deficit: Secondary | ICD-10-CM | POA: Diagnosis not present

## 2022-08-06 DIAGNOSIS — R079 Chest pain, unspecified: Secondary | ICD-10-CM | POA: Diagnosis not present

## 2022-08-06 DIAGNOSIS — R0789 Other chest pain: Secondary | ICD-10-CM | POA: Diagnosis not present

## 2022-08-08 DIAGNOSIS — R2689 Other abnormalities of gait and mobility: Secondary | ICD-10-CM | POA: Diagnosis not present

## 2022-08-08 DIAGNOSIS — M6281 Muscle weakness (generalized): Secondary | ICD-10-CM | POA: Diagnosis not present

## 2022-08-09 DIAGNOSIS — M6281 Muscle weakness (generalized): Secondary | ICD-10-CM | POA: Diagnosis not present

## 2022-08-09 DIAGNOSIS — R278 Other lack of coordination: Secondary | ICD-10-CM | POA: Diagnosis not present

## 2022-08-10 DIAGNOSIS — M6281 Muscle weakness (generalized): Secondary | ICD-10-CM | POA: Diagnosis not present

## 2022-08-10 DIAGNOSIS — R2689 Other abnormalities of gait and mobility: Secondary | ICD-10-CM | POA: Diagnosis not present

## 2022-08-11 ENCOUNTER — Telehealth: Payer: Self-pay

## 2022-08-11 DIAGNOSIS — R278 Other lack of coordination: Secondary | ICD-10-CM | POA: Diagnosis not present

## 2022-08-11 DIAGNOSIS — R296 Repeated falls: Secondary | ICD-10-CM | POA: Diagnosis not present

## 2022-08-11 DIAGNOSIS — F0390 Unspecified dementia without behavioral disturbance: Secondary | ICD-10-CM

## 2022-08-11 DIAGNOSIS — E1122 Type 2 diabetes mellitus with diabetic chronic kidney disease: Secondary | ICD-10-CM

## 2022-08-11 DIAGNOSIS — I10 Essential (primary) hypertension: Secondary | ICD-10-CM

## 2022-08-11 DIAGNOSIS — I2510 Atherosclerotic heart disease of native coronary artery without angina pectoris: Secondary | ICD-10-CM

## 2022-08-11 NOTE — Patient Outreach (Addendum)
Received a referral for Mr. Ausburn, from [name] office asking for someone to go to Mr. Damaso's home for an assessment. I have sent a referral to Deloria Lair, NP.   [facility], [place], [place] Management [phone]

## 2022-08-12 ENCOUNTER — Telehealth: Payer: Self-pay | Admitting: *Deleted

## 2022-08-12 DIAGNOSIS — L89612 Pressure ulcer of right heel, stage 2: Secondary | ICD-10-CM | POA: Diagnosis not present

## 2022-08-12 DIAGNOSIS — I10 Essential (primary) hypertension: Secondary | ICD-10-CM | POA: Diagnosis not present

## 2022-08-12 DIAGNOSIS — E43 Unspecified severe protein-calorie malnutrition: Secondary | ICD-10-CM | POA: Diagnosis not present

## 2022-08-12 DIAGNOSIS — Z8673 Personal history of transient ischemic attack (TIA), and cerebral infarction without residual deficits: Secondary | ICD-10-CM | POA: Diagnosis not present

## 2022-08-12 DIAGNOSIS — F01C11 Vascular dementia, severe, with agitation: Secondary | ICD-10-CM | POA: Diagnosis not present

## 2022-08-12 DIAGNOSIS — E1151 Type 2 diabetes mellitus with diabetic peripheral angiopathy without gangrene: Secondary | ICD-10-CM | POA: Diagnosis not present

## 2022-08-12 DIAGNOSIS — Z7189 Other specified counseling: Secondary | ICD-10-CM | POA: Diagnosis not present

## 2022-08-12 DIAGNOSIS — I739 Peripheral vascular disease, unspecified: Secondary | ICD-10-CM | POA: Diagnosis not present

## 2022-08-12 NOTE — Patient Outreach (Signed)
Care coordination call to [name] office to talk with [name] regarding NP possible Home Visit. NP talked with [name] yesterday and she advised NP that a nurse had already called and would be calling today to schedule a home visit. Call to clarify [facility] NP need. Left message on [facility] voice mail.  Stephen Pont. [name], MSN, GNP-BC Gerontological Nurse Practitioner [facility] Care Management [phone]  Addendum: [name] returned my call and connected me with [name] who gave me an overview of pt situation. Would like home visit to assess for needs and end of life planning. He states he feels pt is a Hospice candidate and [name] is reluctant at this time.  Called [name] and advised NP has talked with Dr Brigitte Pulse and would like NP to visit. She is in agreement. HH will be out on Sunday. NP will call [name] on Monday to verify [facility] schedule and when NP can make a home visit.  Stephen Pont. [name], MSN, [facility] Gerontological Nurse Practitioner [facility] Care Management [phone]

## 2022-08-13 DIAGNOSIS — K219 Gastro-esophageal reflux disease without esophagitis: Secondary | ICD-10-CM | POA: Diagnosis not present

## 2022-08-13 DIAGNOSIS — F0280 Dementia in other diseases classified elsewhere without behavioral disturbance: Secondary | ICD-10-CM | POA: Diagnosis not present

## 2022-08-13 DIAGNOSIS — I6523 Occlusion and stenosis of bilateral carotid arteries: Secondary | ICD-10-CM | POA: Diagnosis not present

## 2022-08-13 DIAGNOSIS — Z8673 Personal history of transient ischemic attack (TIA), and cerebral infarction without residual deficits: Secondary | ICD-10-CM | POA: Diagnosis not present

## 2022-08-13 DIAGNOSIS — R1310 Dysphagia, unspecified: Secondary | ICD-10-CM | POA: Diagnosis not present

## 2022-08-13 DIAGNOSIS — L89620 Pressure ulcer of left heel, unstageable: Secondary | ICD-10-CM | POA: Diagnosis not present

## 2022-08-13 DIAGNOSIS — Z7984 Long term (current) use of oral hypoglycemic drugs: Secondary | ICD-10-CM | POA: Diagnosis not present

## 2022-08-13 DIAGNOSIS — I2510 Atherosclerotic heart disease of native coronary artery without angina pectoris: Secondary | ICD-10-CM | POA: Diagnosis not present

## 2022-08-13 DIAGNOSIS — L89610 Pressure ulcer of right heel, unstageable: Secondary | ICD-10-CM | POA: Diagnosis not present

## 2022-08-13 DIAGNOSIS — Z4800 Encounter for change or removal of nonsurgical wound dressing: Secondary | ICD-10-CM | POA: Diagnosis not present

## 2022-08-13 DIAGNOSIS — M6281 Muscle weakness (generalized): Secondary | ICD-10-CM | POA: Diagnosis not present

## 2022-08-13 DIAGNOSIS — Z7902 Long term (current) use of antithrombotics/antiplatelets: Secondary | ICD-10-CM | POA: Diagnosis not present

## 2022-08-13 DIAGNOSIS — E1151 Type 2 diabetes mellitus with diabetic peripheral angiopathy without gangrene: Secondary | ICD-10-CM | POA: Diagnosis not present

## 2022-08-13 DIAGNOSIS — E785 Hyperlipidemia, unspecified: Secondary | ICD-10-CM | POA: Diagnosis not present

## 2022-08-13 DIAGNOSIS — N1831 Chronic kidney disease, stage 3a: Secondary | ICD-10-CM | POA: Diagnosis not present

## 2022-08-13 DIAGNOSIS — Z79891 Long term (current) use of opiate analgesic: Secondary | ICD-10-CM | POA: Diagnosis not present

## 2022-08-13 DIAGNOSIS — Z9181 History of falling: Secondary | ICD-10-CM | POA: Diagnosis not present

## 2022-08-13 DIAGNOSIS — I051 Rheumatic mitral insufficiency: Secondary | ICD-10-CM | POA: Diagnosis not present

## 2022-08-13 DIAGNOSIS — I129 Hypertensive chronic kidney disease with stage 1 through stage 4 chronic kidney disease, or unspecified chronic kidney disease: Secondary | ICD-10-CM | POA: Diagnosis not present

## 2022-08-13 DIAGNOSIS — E1122 Type 2 diabetes mellitus with diabetic chronic kidney disease: Secondary | ICD-10-CM | POA: Diagnosis not present

## 2022-08-13 DIAGNOSIS — G319 Degenerative disease of nervous system, unspecified: Secondary | ICD-10-CM | POA: Diagnosis not present

## 2022-08-15 ENCOUNTER — Telehealth: Payer: Self-pay

## 2022-08-15 ENCOUNTER — Telehealth: Payer: Self-pay | Admitting: *Deleted

## 2022-08-15 ENCOUNTER — Encounter: Payer: Self-pay | Admitting: *Deleted

## 2022-08-15 NOTE — Patient Outreach (Signed)
  Emmi Stroke Care Coordination Follow Up  08/15/2022 Name:  [name] MRN:  [id number] DOB:  [birth year]  Subjective: [name] is a 87 y.o. year old male who is a primary care patient of Ginger Organ., MD   An Emmi alert was received indicating patient responded to questions: Feeling worse overall?.   I reached out by phone to follow up on the alert.   Care Coordination Interventions Activated:  No  Care Coordination Interventions:  No, not indicated   Follow up plan:  RN CM will make outreach attempt to pt/spouse if no return call.    Encounter Outcome:  No Answer   Enzo Montgomery, RN,BSN,CCM [place] Management Telephonic Care Management Coordinator Direct Phone: [phone] Toll Free: [phone] Fax: [phone]

## 2022-08-15 NOTE — Patient Outreach (Signed)
Received a red flag Emmi stroke notification for [name] . I have assigned Stephen Montgomery, RN to call for follow up and determine if there are any Case Management needs.    [facility], [place], [place] Management [phone]

## 2022-08-15 NOTE — Patient Outreach (Signed)
  Care Coordination   Follow Up Visit Note   08/15/2022 Name: ERLE GUSTER MRN: [id number] DOB: [birth year]  JARELL MCEWEN is a 87 y.o. year old male who sees Brigitte Pulse, Emily Filbert., MD for primary care. I spoke with Mrs.[name] by phone today.  What matters to the patients health and wellness today?  Getting a Hospice consult.    Goals Addressed             This Visit's Progress    Get the right care at the right time.       Care Coordination Interventions: Mrs. Lepore reports her husband's condition has declined. He is not speaking. He spat out his pills. He has only taken in a few sips of Ensure. She feels like he approaching the end of life and the home health nurse from [facility] has requested a Hospice consult. Encouraged Mrs. Tangen that if [name] has determined that he may not live longer than 6 months then he is appropriate for Hospice care. She remarked that he won't last that long now and is willing to have the consult.  Provided rationale for early access to Hospice care to relieve end of life burden on family and to receive their support. Encouraged her and family members to visit and talk with him even if he cannot respond and to touch him in caring ways to let him know you are with him.         SDOH assessments and interventions completed:     Care Coordination Interventions Activated:  Yes  Care Coordination Interventions:  Yes, provided   Follow up plan: Follow up call scheduled for Wednesday.    Encounter Outcome:  Pt. Visit Completed   Kayleen Memos C. [name], MSN, [facility] Gerontological Nurse Practitioner [facility] Care Management [phone]

## 2022-08-16 ENCOUNTER — Other Ambulatory Visit: Payer: Self-pay | Admitting: *Deleted

## 2022-08-16 ENCOUNTER — Telehealth: Payer: Self-pay

## 2022-08-16 DIAGNOSIS — I739 Peripheral vascular disease, unspecified: Secondary | ICD-10-CM | POA: Diagnosis not present

## 2022-08-16 DIAGNOSIS — K219 Gastro-esophageal reflux disease without esophagitis: Secondary | ICD-10-CM | POA: Diagnosis not present

## 2022-08-16 DIAGNOSIS — E785 Hyperlipidemia, unspecified: Secondary | ICD-10-CM | POA: Diagnosis not present

## 2022-08-16 DIAGNOSIS — E118 Type 2 diabetes mellitus with unspecified complications: Secondary | ICD-10-CM | POA: Diagnosis not present

## 2022-08-16 DIAGNOSIS — I2510 Atherosclerotic heart disease of native coronary artery without angina pectoris: Secondary | ICD-10-CM | POA: Diagnosis not present

## 2022-08-16 DIAGNOSIS — Z66 Do not resuscitate: Secondary | ICD-10-CM | POA: Diagnosis not present

## 2022-08-16 DIAGNOSIS — Z515 Encounter for palliative care: Secondary | ICD-10-CM | POA: Diagnosis not present

## 2022-08-16 DIAGNOSIS — I6529 Occlusion and stenosis of unspecified carotid artery: Secondary | ICD-10-CM

## 2022-08-16 DIAGNOSIS — F0280 Dementia in other diseases classified elsewhere without behavioral disturbance: Secondary | ICD-10-CM | POA: Diagnosis not present

## 2022-08-16 DIAGNOSIS — G311 Senile degeneration of brain, not elsewhere classified: Secondary | ICD-10-CM | POA: Diagnosis not present

## 2022-08-16 DIAGNOSIS — I69391 Dysphagia following cerebral infarction: Secondary | ICD-10-CM | POA: Diagnosis not present

## 2022-08-16 NOTE — Patient Outreach (Signed)
  Emmi Stroke Care Coordination Follow Up  08/16/2022 Name:  [name] MRN:  [id number] DOB:  1935-04-16  Subjective: [name] is a 87 y.o. year old male who is a primary care patient of Ginger Organ., MD   An Emmi alert was received indicating patient responded to questions: Feeling worse overall?.   I reached out by phone to follow up on the alert and spoke to Caregiver/spouse. Reviewed and addressed red alert. Spouse reports that patient has "gone down [place]" over the past several days. She is aware and accepting of the fact that he is nearing death and end of life. Support given to spouse. Spouse voices that she has two caregivers in the home that assist her with caring for patient. He has been referred to hospice and Hospice RN coming out today to do initial eval to see if pt qualifies for services. She denies any further RN CM needs or concerns at this time.    Care Coordination Interventions Activated:  Yes  Care Coordination Interventions:  Yes, provided   Follow up plan: No further intervention required.   Encounter Outcome:  Pt. Visit Completed   Enzo Montgomery, RN,BSN,CCM [place] Management Telephonic Care Management Coordinator Direct Phone: [phone] Toll Free: [phone] Fax: [phone]

## 2022-08-17 ENCOUNTER — Encounter: Payer: Self-pay | Admitting: *Deleted

## 2022-08-17 ENCOUNTER — Telehealth: Payer: Self-pay | Admitting: *Deleted

## 2022-08-17 DIAGNOSIS — I69391 Dysphagia following cerebral infarction: Secondary | ICD-10-CM | POA: Diagnosis not present

## 2022-08-17 DIAGNOSIS — F0280 Dementia in other diseases classified elsewhere without behavioral disturbance: Secondary | ICD-10-CM | POA: Diagnosis not present

## 2022-08-17 DIAGNOSIS — I2510 Atherosclerotic heart disease of native coronary artery without angina pectoris: Secondary | ICD-10-CM | POA: Diagnosis not present

## 2022-08-17 DIAGNOSIS — E118 Type 2 diabetes mellitus with unspecified complications: Secondary | ICD-10-CM | POA: Diagnosis not present

## 2022-08-17 DIAGNOSIS — K219 Gastro-esophageal reflux disease without esophagitis: Secondary | ICD-10-CM | POA: Diagnosis not present

## 2022-08-17 DIAGNOSIS — G311 Senile degeneration of brain, not elsewhere classified: Secondary | ICD-10-CM | POA: Diagnosis not present

## 2022-08-17 NOTE — Patient Outreach (Signed)
  Care Coordination   Follow Up Visit Note   08/17/2022 Name: [name] MRN: [id number] DOB: Aug 09, 1935  [name] is a 87 y.o. year old male who sees Brigitte Pulse, Emily Filbert., MD for primary care. I spoke with  [name] by phone today.  What matters to the patients health and wellness today?  Providing for my husband's end of life.    Goals Addressed             This Visit's Progress    COMPLETED: Get the right care at the right time.       Care Coordination Interventions: Update: Mrs. Mathieson reports they have had the Hospice consult and she has accepted the service for her husband. They will be having a nurse come daily as his end of life is approaching. She reports she feels relieved at having made this decision. Listened to and supported Mrs. Rehman. Advised she may call me at any time if she needs anything for herself in the future.         SDOH assessments and interventions completed:  No   Care Coordination Interventions Activated:  Yes  Care Coordination Interventions:  Yes, provided   Follow up plan: No further intervention required.   Encounter Outcome:  Pt. Visit Completed   Kayleen Memos C. [name], MSN, [facility] Gerontological Nurse Practitioner [facility] Care Management [phone]

## 2022-08-18 DIAGNOSIS — K219 Gastro-esophageal reflux disease without esophagitis: Secondary | ICD-10-CM | POA: Diagnosis not present

## 2022-08-18 DIAGNOSIS — I69391 Dysphagia following cerebral infarction: Secondary | ICD-10-CM | POA: Diagnosis not present

## 2022-08-18 DIAGNOSIS — F0280 Dementia in other diseases classified elsewhere without behavioral disturbance: Secondary | ICD-10-CM | POA: Diagnosis not present

## 2022-08-18 DIAGNOSIS — G311 Senile degeneration of brain, not elsewhere classified: Secondary | ICD-10-CM | POA: Diagnosis not present

## 2022-08-18 DIAGNOSIS — E118 Type 2 diabetes mellitus with unspecified complications: Secondary | ICD-10-CM | POA: Diagnosis not present

## 2022-08-18 DIAGNOSIS — I2510 Atherosclerotic heart disease of native coronary artery without angina pectoris: Secondary | ICD-10-CM | POA: Diagnosis not present

## 2022-08-30 ENCOUNTER — Encounter: Payer: Medicare Other | Admitting: Vascular Surgery

## 2022-08-30 ENCOUNTER — Encounter (HOSPITAL_COMMUNITY): Payer: Medicare Other

## 2022-08-31 DEATH — deceased

## 2022-09-27 ENCOUNTER — Inpatient Hospital Stay: Payer: Medicare Other | Admitting: Neurology

## 2023-08-27 IMAGING — DX DG CHEST 1V PORT
1 series · 1 of 1 positions shown · non-contrast
Comparison: CT of the chest abdomen and pelvis from 0255.

CLINICAL DATA: Weakness, generalized weakness for 3 days, unable to
walk due to bilateral leg weakness.

EXAM:
PORTABLE CHEST 1 VIEW

[chest ap]
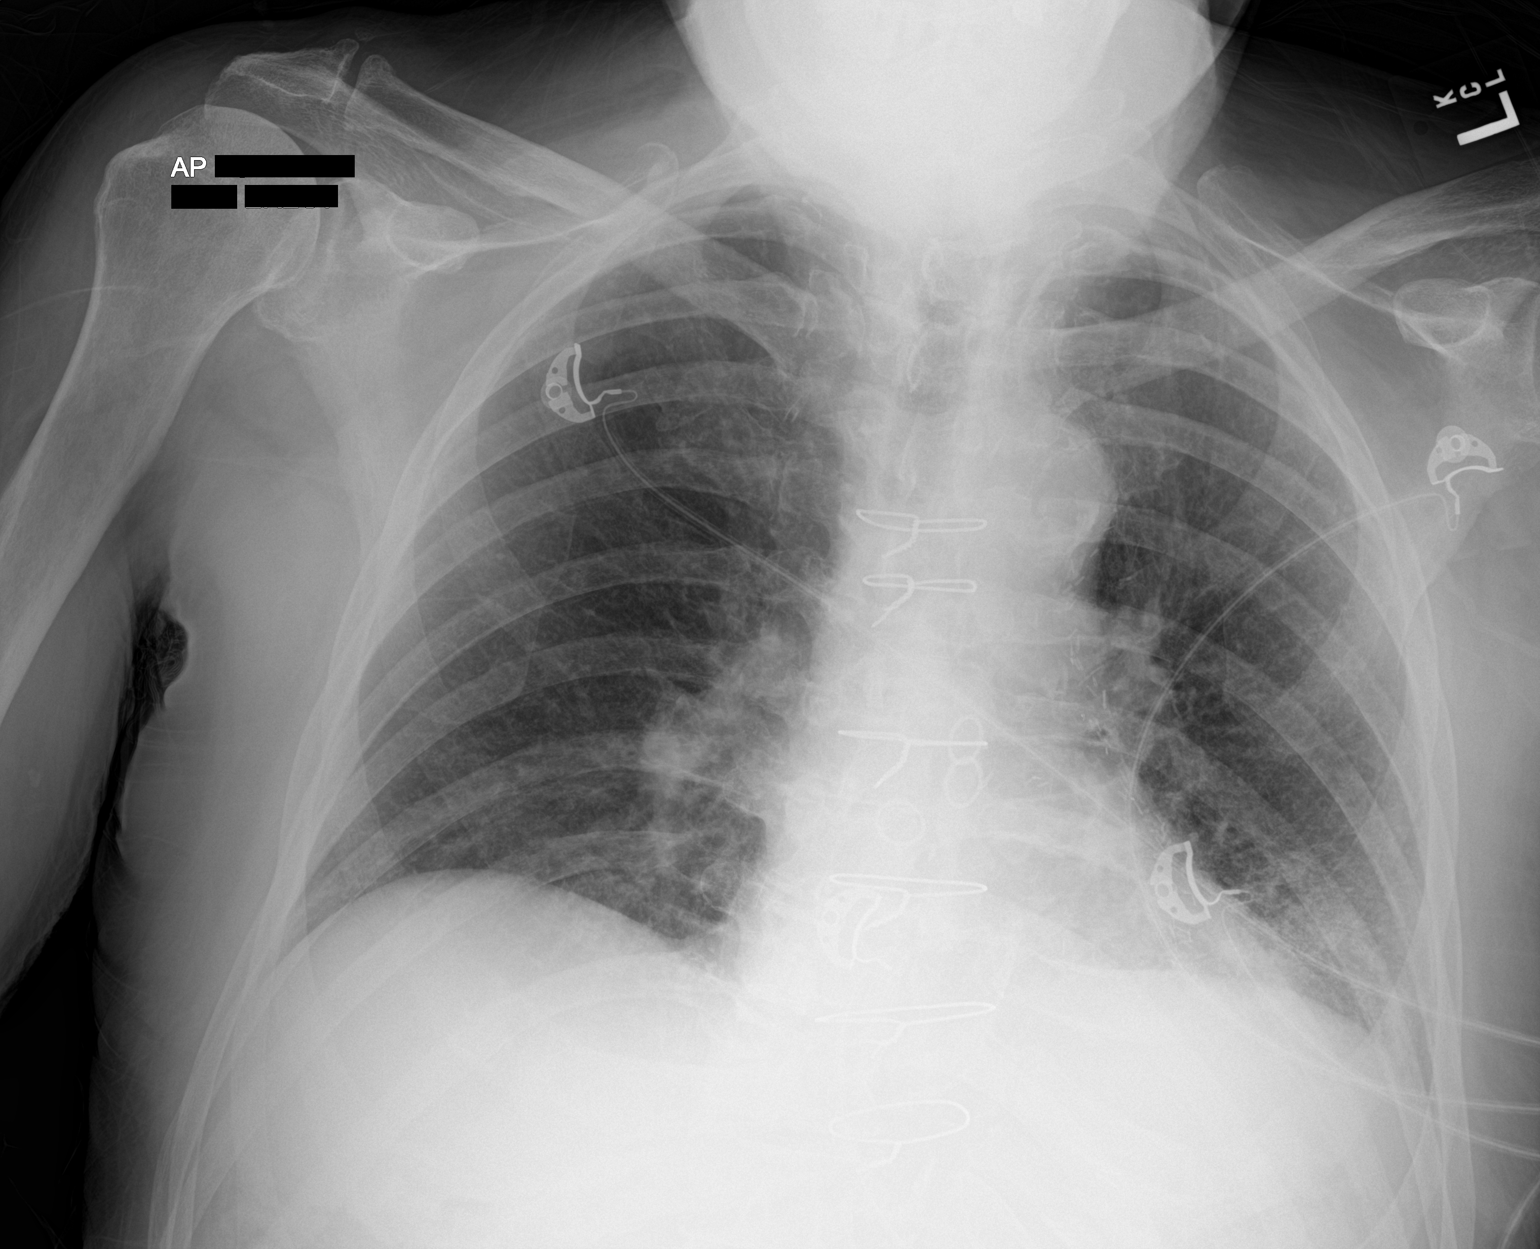

[1 of 1 positions shown; findings below may reference images not displayed]

FINDINGS: Post median sternotomy for CABG.  EKG leads project over the chest.

Cardiomediastinal contours with mild cardiac enlargement,
accentuated by portable technique and low lung volumes. No lobar
consolidative process. There is however patchy opacities overlying
the LEFT hemidiaphragm and LEFT heart border and increased density
in the retrocardiac region. No pneumothorax.

On limited assessment there is no acute skeletal process.
IMPRESSION: Findings suspicious for developing LEFT lower lobe pneumonia.

Post median sternotomy for CABG.

## 2023-08-27 IMAGING — CT CT HEAD W/O CM
4 of 7 series · 15 of 47 positions shown, 17 images · non-contrast
Comparison: Brain MRA 03/23/2020, brain MRI 02/11/2020

CLINICAL DATA: Delirium

EXAM:
CT HEAD WITHOUT CONTRAST
TECHNIQUE: Contiguous axial images were obtained from the base of the skull
through the vertex without intravenous contrast.

[Series 2: head wo · axial · 0.47mm/px · z∈[-153,-33]mm · 7 of 32 slices shown, 9 images (1 of 2)]
[im 4/32  brain]
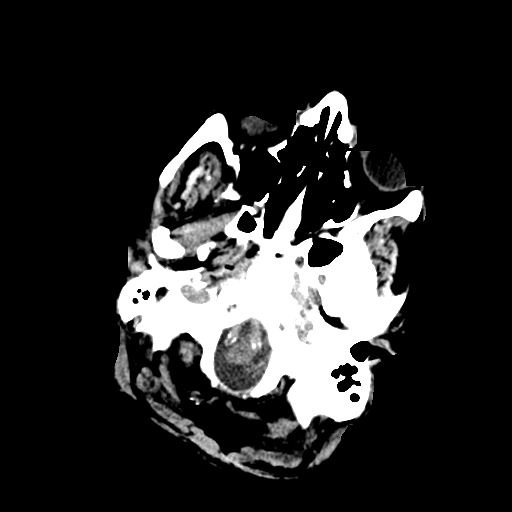
[im 4/32  bone]
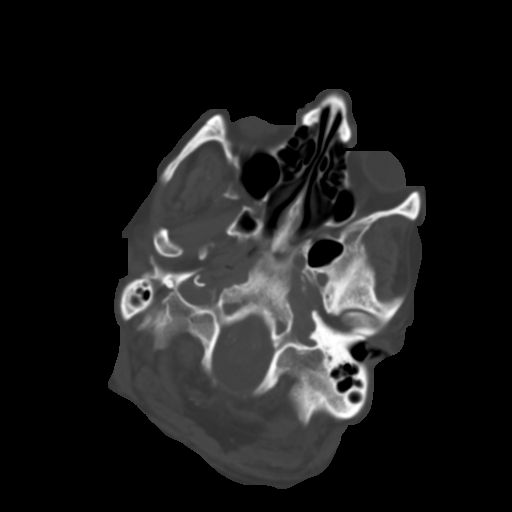
[im 8/32  brain]
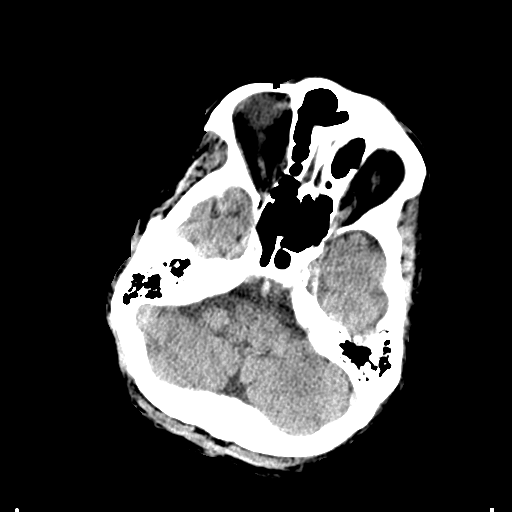
[im 12/32  brain]
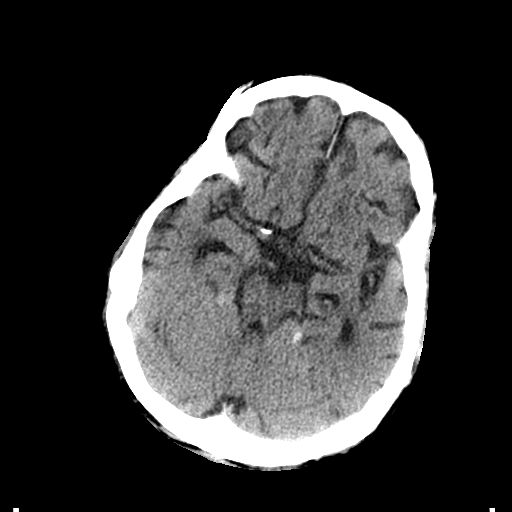
[im 16/32  brain]
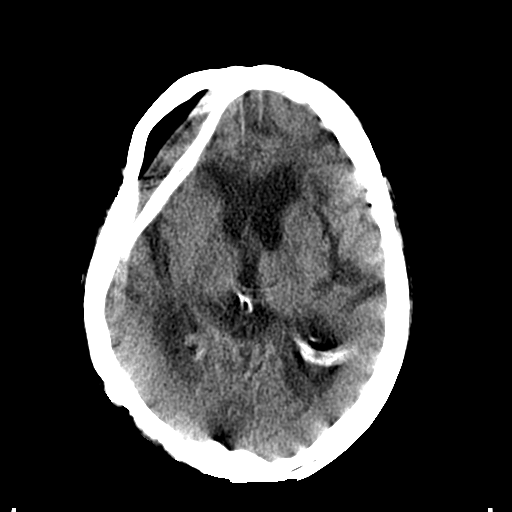
[im 20/32  brain]
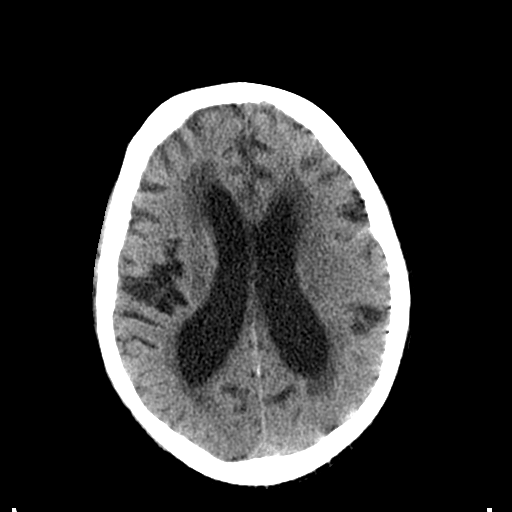
[im 20/32  bone]
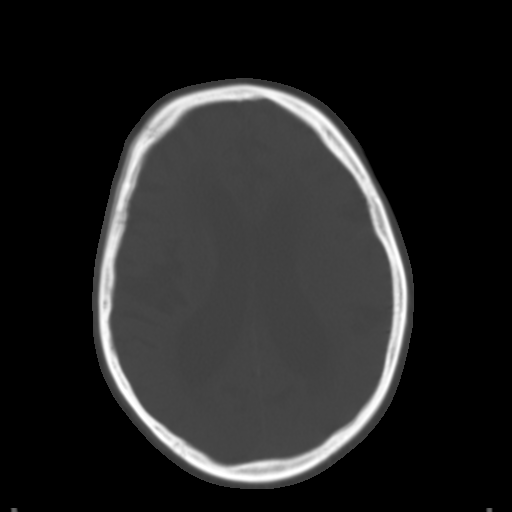
[im 24/32  brain]
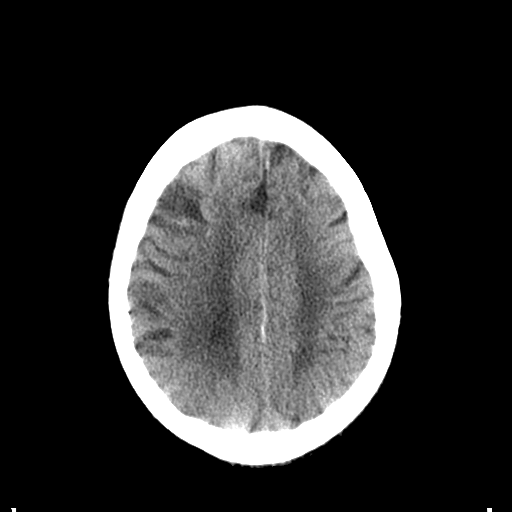
[im 28/32  brain]
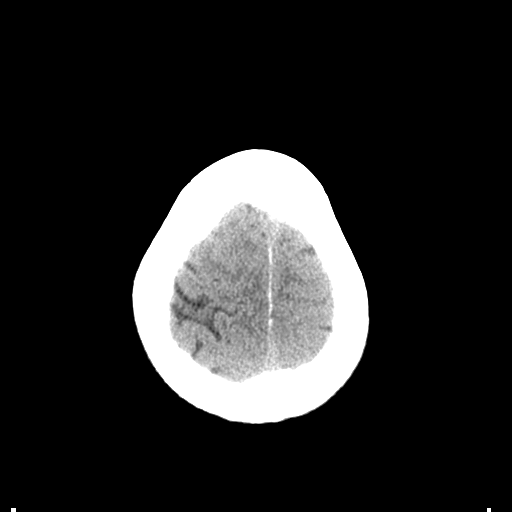

[Series 6: head wo · axial · 0.47mm/px · z∈[-98,-58]mm · 3 of 22 slices shown (2 of 2)]
[im 5/22  brain]
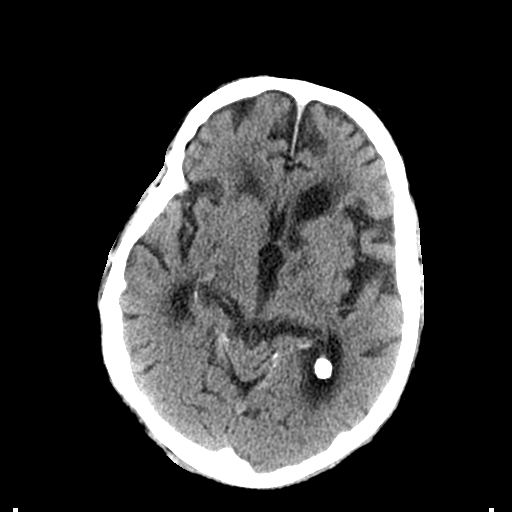
[im 9/22  brain]
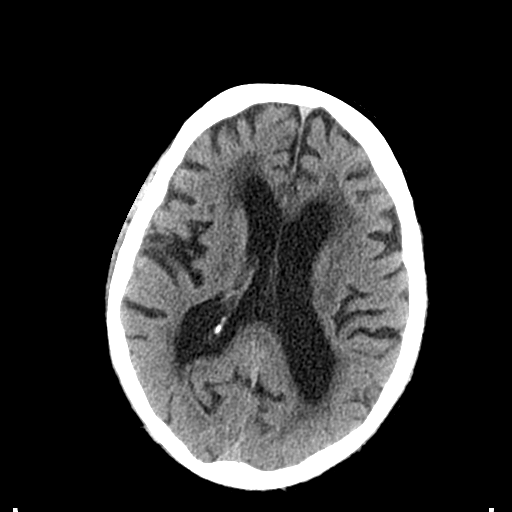
[im 13/22  brain]
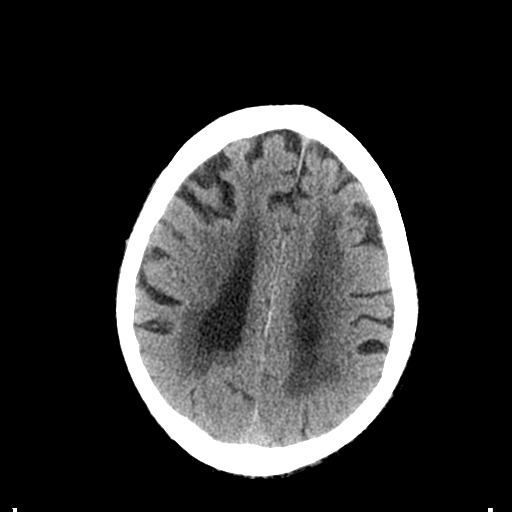

[Series 7: coronal soft tissue · coronal · 0.32mm/px · 3 of 84 slices shown]
[im 21/84  brain]
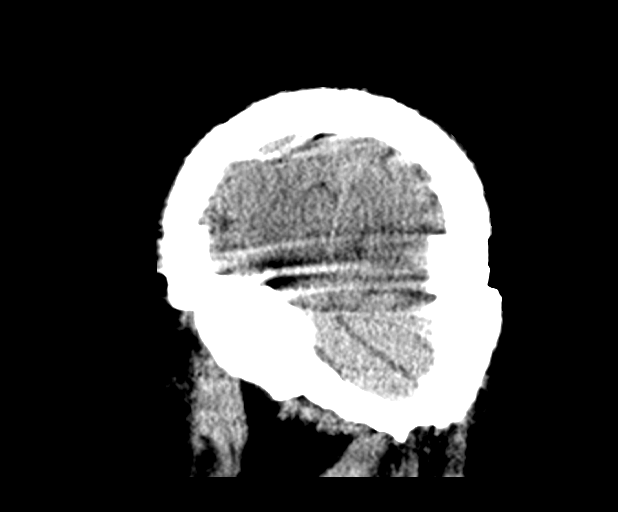
[im 42/84  brain]
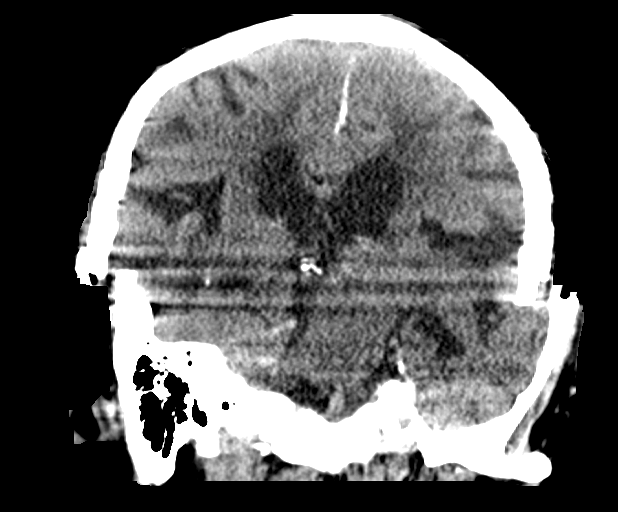
[im 63/84  brain]
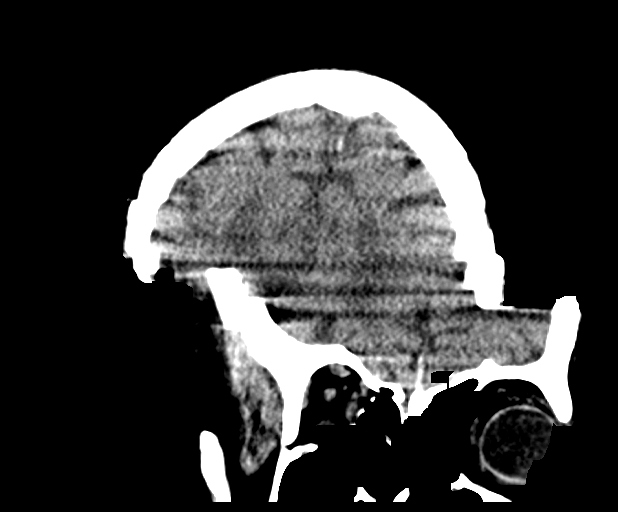

[Series 10: sagittal soft tissue · sagittal · 0.25mm/px · 2 of 64 slices shown]
[im 22/64  brain]
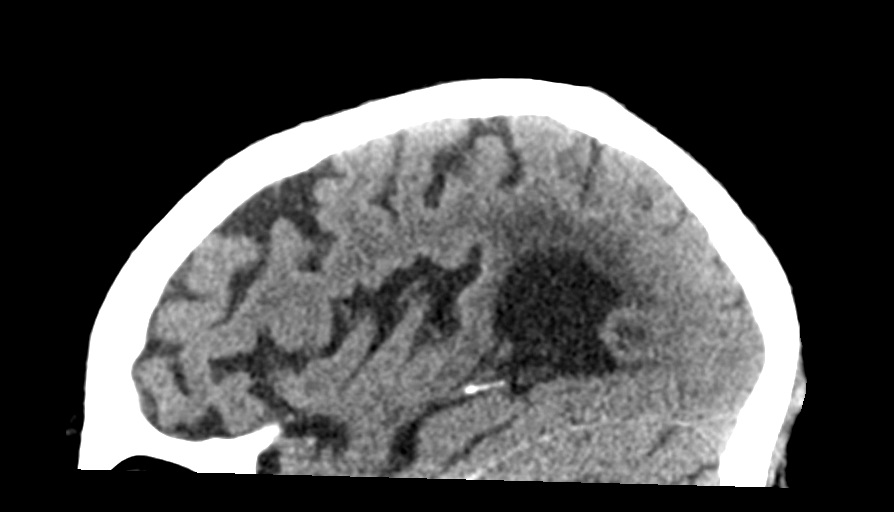
[im 43/64  brain]
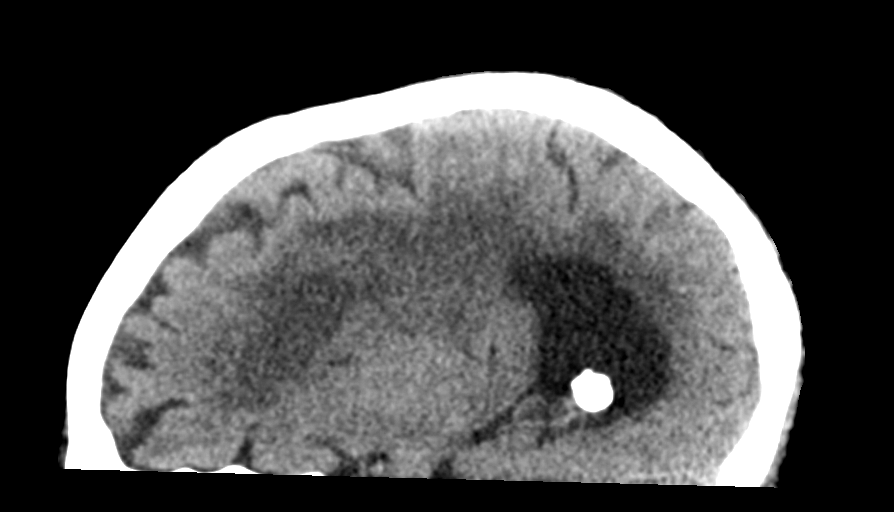

[15 of 47 positions shown; findings below may reference images not displayed]

FINDINGS: Image quality is degraded by motion artifact.

Brain: There is no evidence of acute intracranial hemorrhage,
extra-axial fluid collection, or infarct.

There is global parenchymal volume loss with commensurate
enlargement of the ventricular system, similar to the prior MRI.
There is confluent hypodensity in the subcortical and
periventricular white matter likely reflecting sequela of advanced
chronic white matter microangiopathy. There is no mass lesion. There
is no midline shift.

Vascular: There is calcification of the bilateral cavernous ICAs.

Skull: Normal. Negative for definite fracture or focal lesion.

Sinuses/Orbits: The imaged paranasal sinuses are clear. Bilateral
lens implants are in place. The globes and orbits are otherwise
unremarkable.

Other: None.
IMPRESSION: 1. No acute intracranial pathology, within the confines of motion
degraded images.
2. Overall unchanged parenchymal volume loss and chronic white
matter microangiopathy. 3
